# Patient Record
Sex: Male | Born: 1935 | Race: Black or African American | Hispanic: No | Smoking: Former smoker
Health system: Southern US, Community
[De-identification: ages and names within clinical notes are randomized; demographics above are authoritative.]

## PROBLEM LIST (undated history)

## (undated) DIAGNOSIS — I6529 Occlusion and stenosis of unspecified carotid artery: Secondary | ICD-10-CM

## (undated) DIAGNOSIS — I701 Atherosclerosis of renal artery: Secondary | ICD-10-CM

## (undated) DIAGNOSIS — D649 Anemia, unspecified: Secondary | ICD-10-CM

## (undated) DIAGNOSIS — E119 Type 2 diabetes mellitus without complications: Secondary | ICD-10-CM

## (undated) DIAGNOSIS — I739 Peripheral vascular disease, unspecified: Secondary | ICD-10-CM

## (undated) DIAGNOSIS — N289 Disorder of kidney and ureter, unspecified: Secondary | ICD-10-CM

## (undated) DIAGNOSIS — I10 Essential (primary) hypertension: Secondary | ICD-10-CM

## (undated) DIAGNOSIS — E7800 Pure hypercholesterolemia, unspecified: Secondary | ICD-10-CM

## (undated) HISTORY — DX: Essential (primary) hypertension: I10

## (undated) HISTORY — PX: COLONOSCOPY: SHX174

## (undated) HISTORY — DX: Pure hypercholesterolemia, unspecified: E78.00

## (undated) HISTORY — PX: APPENDECTOMY: SHX54

## (undated) HISTORY — PX: OTHER SURGICAL HISTORY: SHX169

## (undated) HISTORY — DX: Atherosclerosis of renal artery: I70.1

## (undated) HISTORY — DX: Occlusion and stenosis of unspecified carotid artery: I65.29

## (undated) HISTORY — DX: Anemia, unspecified: D64.9

## (undated) HISTORY — DX: Type 2 diabetes mellitus without complications: E11.9

## (undated) HISTORY — DX: Peripheral vascular disease, unspecified: I73.9

---

## 1997-10-29 HISTORY — PX: OTHER SURGICAL HISTORY: SHX169

## 2006-03-12 ENCOUNTER — Ambulatory Visit: Payer: Self-pay | Admitting: *Deleted

## 2006-03-22 ENCOUNTER — Ambulatory Visit: Payer: Self-pay | Admitting: *Deleted

## 2006-04-28 ENCOUNTER — Emergency Department: Payer: Self-pay | Admitting: Internal Medicine

## 2006-04-28 ENCOUNTER — Other Ambulatory Visit: Payer: Self-pay

## 2006-05-06 ENCOUNTER — Ambulatory Visit: Payer: Self-pay | Admitting: Internal Medicine

## 2006-06-28 ENCOUNTER — Ambulatory Visit: Payer: Self-pay | Admitting: Cardiology

## 2006-07-19 ENCOUNTER — Ambulatory Visit (HOSPITAL_COMMUNITY): Admission: RE | Admit: 2006-07-19 | Discharge: 2006-07-19 | Payer: Self-pay | Admitting: Cardiology

## 2006-07-19 ENCOUNTER — Ambulatory Visit: Payer: Self-pay | Admitting: Cardiology

## 2006-08-14 ENCOUNTER — Ambulatory Visit: Payer: Self-pay | Admitting: Cardiology

## 2006-09-09 ENCOUNTER — Ambulatory Visit: Payer: Self-pay

## 2007-03-11 ENCOUNTER — Ambulatory Visit: Payer: Self-pay

## 2007-05-05 ENCOUNTER — Ambulatory Visit: Payer: Self-pay | Admitting: Family Medicine

## 2007-09-01 ENCOUNTER — Ambulatory Visit: Payer: Self-pay

## 2007-09-01 ENCOUNTER — Ambulatory Visit: Payer: Self-pay | Admitting: Cardiovascular Disease

## 2008-03-09 ENCOUNTER — Ambulatory Visit: Payer: Self-pay

## 2008-03-09 ENCOUNTER — Ambulatory Visit: Payer: Self-pay | Admitting: Cardiovascular Disease

## 2008-09-09 ENCOUNTER — Ambulatory Visit: Payer: Self-pay | Admitting: Cardiovascular Disease

## 2009-03-15 DIAGNOSIS — I2510 Atherosclerotic heart disease of native coronary artery without angina pectoris: Secondary | ICD-10-CM | POA: Insufficient documentation

## 2009-03-15 DIAGNOSIS — N189 Chronic kidney disease, unspecified: Secondary | ICD-10-CM

## 2009-03-15 DIAGNOSIS — E118 Type 2 diabetes mellitus with unspecified complications: Secondary | ICD-10-CM | POA: Insufficient documentation

## 2009-03-15 DIAGNOSIS — I10 Essential (primary) hypertension: Secondary | ICD-10-CM

## 2009-03-15 DIAGNOSIS — D631 Anemia in chronic kidney disease: Secondary | ICD-10-CM | POA: Insufficient documentation

## 2009-03-15 DIAGNOSIS — I739 Peripheral vascular disease, unspecified: Secondary | ICD-10-CM | POA: Insufficient documentation

## 2009-03-15 DIAGNOSIS — I779 Disorder of arteries and arterioles, unspecified: Secondary | ICD-10-CM | POA: Insufficient documentation

## 2009-03-16 ENCOUNTER — Ambulatory Visit: Payer: Self-pay | Admitting: Cardiovascular Disease

## 2009-03-16 ENCOUNTER — Ambulatory Visit: Payer: Self-pay

## 2009-03-16 DIAGNOSIS — I701 Atherosclerosis of renal artery: Secondary | ICD-10-CM | POA: Insufficient documentation

## 2009-09-21 ENCOUNTER — Ambulatory Visit: Payer: Self-pay

## 2009-09-21 ENCOUNTER — Encounter: Payer: Self-pay | Admitting: Cardiovascular Disease

## 2010-03-13 ENCOUNTER — Encounter: Payer: Self-pay | Admitting: Cardiovascular Disease

## 2010-03-14 ENCOUNTER — Ambulatory Visit: Payer: Self-pay | Admitting: Cardiovascular Disease

## 2010-03-14 ENCOUNTER — Ambulatory Visit: Payer: Self-pay

## 2010-08-17 ENCOUNTER — Ambulatory Visit: Payer: Self-pay | Admitting: Family Medicine

## 2010-09-20 ENCOUNTER — Ambulatory Visit: Payer: Self-pay

## 2010-09-20 ENCOUNTER — Ambulatory Visit: Payer: Self-pay | Admitting: Cardiovascular Disease

## 2010-11-28 NOTE — Miscellaneous (Signed)
Summary: Orders Update  Clinical Lists Changes  Orders: Added new Test order of Carotid Duplex (Carotid Duplex) - Signed 

## 2010-11-28 NOTE — Assessment & Plan Note (Signed)
Summary: Samuel Bryan   Visit Type:  1 year follow up Primary Provider:  Dr Ruthann Cancer  CC:  Left leg pain at  night and knees pain.  History of Present Illness: 77 year-old man with HTN, Diabetes, carotid stenosis, and renal artery stenosis, presents for follow-up evaluation. He has been symptomatically stable since his evaluation last year. He denies chest pain, dyspnea, edema, or lightheadedness. He complains of leg weakness with activity, mostly in his knees. No amaurosis, aphasia, or stroke/TIA symptoms.  Current Medications (verified): 1)  Metformin Hcl 500 Mg Tabs (Metformin Hcl) .... Take 1 Tablet By Mouth Once A Day 2)  Cilostazol 100 Mg Tabs (Cilostazol) .... Take 1 Tablet By Mouth Two Times A Day 3)  Lipitor 40 Mg Tabs (Atorvastatin Calcium) .... Take One Tablet By Mouth Daily. 4)  Ranitidine Hcl 300 Mg Tabs (Ranitidine Hcl) .... Take 1 Tablet By Mouth Two Times A Day 5)  Doxazosin Mesylate 8 Mg Tabs (Doxazosin Mesylate) .... Take 1 Tablet By Mouth Once A Day 6)  Bisoprolol Fumarate 5 Mg Tabs (Bisoprolol Fumarate) .... Take 1 Tablet By Mouth Once A Day 7)  Micardis 40 Mg Tabs (Telmisartan) .... Take 1 Tablet By Mouth Once A Day 8)  Amlodipine Besylate 10 Mg Tabs (Amlodipine Besylate) .... Take One Tablet By Mouth Daily 9)  Aspirin 81 Mg Tbec (Aspirin) .... Take One Tablet By Mouth Daily 10)  Hydrochlorothiazide 25 Mg Tabs (Hydrochlorothiazide) .... Take One Tablet By Mouth Daily. 11)  Fish Oil 1000 Mg Caps (Omega-3 Fatty Acids) .... Take 1 Capsule By Mouth Two Times A Day  Allergies (verified): No Known Drug Allergies  Past History:  Past medical history reviewed for relevance to current acute and chronic problems.  Past Medical History: Reviewed history from 03/15/2009 and no changes required.   PERIPHERAL VASCULAR DISEASE (ICD-443.9) RENAL ARTERY STENOSIS (ICD-440.1). Asymptomatic CAD (ICD-414.00)- coronary artery stenting at [facility] in 1999. HYPERTENSION (ICD-401.9) CAROTID  STENOSIS (ICD-433.10) Asymptomatic DM (ICD-250.00) ANEMIA, HX OF (ICD-V12.3) .  Review of Systems       Negative except as per HPI   Vital Signs:  Patient profile:   77 year old male Height:      66 inches Weight:      189.75 pounds BMI:     30.74 Pulse rate:   68 / minute Pulse rhythm:   regular Resp:     18 per minute BP sitting:   132 / 56  (left arm) Cuff size:   large  Vitals Entered By: Sidney Ace (Mar 14, 2010 9:40 AM)  Serial Vital Signs/Assessments:  Time      Position  BP       Pulse  Resp  Temp     By           R Arm     138/56                         Sidney Ace   Physical Exam  General:  Pt is alert and oriented, elderly, African-American male, in no acute distress. HEENT: normal Neck: normal carotid upstrokes with bilateral bruits, JVP normal Lungs: CTA CV: RRR without murmur or gallop Abd: soft, NT, positive BS, no bruit, no organomegaly Ext: no clubbing, cyanosis, or edema. peripheral pulses 2+ and equal Skin: warm and dry without rash    Carotid Doppler  Procedure date:  03/14/2010  Findings:      Mild carotid disease on the right (0-39%) Moderately  severe disease on the left (60-79%) with peak velocity 404/74  Impression & Recommendations:  Problem # 1:  CAROTID STENOSIS (ICD-433.10) The patient has moderate, asymptomatic left internal carotid stenosis. His disease has progressed by velocity measurements. Recommend follow-up duplex and office visit in 6 months. He was counseled regarding stroke/TIA symptoms. He should continue with aggressive risk reduction measurements.  His updated medication list for this problem includes:    Cilostazol 100 Mg Tabs (Cilostazol) .Marland Kitchen... Take 1 tablet by mouth two times a day    Aspirin 81 Mg Tbec (Aspirin) .Marland Kitchen... Take one tablet by mouth daily  Problem # 2:  RENAL ATHEROSCLEROSIS (ICD-440.1) BP well-controlled on antihypertensive therapy. No indication for revascularization at present.  Problem # 3:   CAD (ICD-414.00) Stable without angina. Continue ASA, beta-blocker, etc.  His updated medication list for this problem includes:    Cilostazol 100 Mg Tabs (Cilostazol) .Marland Kitchen... Take 1 tablet by mouth two times a day    Bisoprolol Fumarate 5 Mg Tabs (Bisoprolol fumarate) .Marland Kitchen... Take 1 tablet by mouth once a day    Amlodipine Besylate 10 Mg Tabs (Amlodipine besylate) .Marland Kitchen... Take one tablet by mouth daily    Aspirin 81 Mg Tbec (Aspirin) .Marland Kitchen... Take one tablet by mouth daily  Problem # 4:  HYPERTENSION (ICD-401.9) Assessment: Unchanged stable  His updated medication list for this problem includes:    Doxazosin Mesylate 8 Mg Tabs (Doxazosin mesylate) .Marland Kitchen... Take 1 tablet by mouth once a day    Bisoprolol Fumarate 5 Mg Tabs (Bisoprolol fumarate) .Marland Kitchen... Take 1 tablet by mouth once a day    Micardis 40 Mg Tabs (Telmisartan) .Marland Kitchen... Take 1 tablet by mouth once a day    Amlodipine Besylate 10 Mg Tabs (Amlodipine besylate) .Marland Kitchen... Take one tablet by mouth daily    Aspirin 81 Mg Tbec (Aspirin) .Marland Kitchen... Take one tablet by mouth daily    Hydrochlorothiazide 25 Mg Tabs (Hydrochlorothiazide) .Marland Kitchen... Take one tablet by mouth daily.  BP today: 132/56 Prior BP: 124/50 (03/16/2009)  Patient Instructions: 1)  Your physician recommends that you continue on your current medications as directed. Please refer to the Current Medication list given to you today. 2)  Your physician wants you to follow-up in: [place] will receive a reminder letter in the mail two months in advance. If you don't receive a letter, please call our office to schedule the follow-up appointment. 3)  Your physician has requested that you have a carotid duplex in 6 MONTHS. This test is an ultrasound of the carotid arteries in your neck. It looks at blood flow through these arteries that supply the brain with blood. Allow one hour for this exam. There are no restrictions or special instructions.

## 2010-11-28 NOTE — Assessment & Plan Note (Signed)
Summary: f42m   Visit Type:  Follow-up Primary Provider:  Dr Ruthann Cancer  CC:  No complaints.  History of Present Illness: 77 year-old man with HTN, Diabetes, carotid stenosis, and renal artery stenosis, presents for follow-up evaluation. He has a history of moderate, asymptomatic carotid stenosis, left greater than right. Previous duplex showed left ICA velocities 404/74 correlating with 60-79% stenosis.  The patient is doing well. His only complaint is knee and hip fatigue with walking. He denies calf or thigh pain. No chest pain, dyspnea, edema, lightheadedness, or syncope. No stroke or TIA symptoms. He specifically denies amaurosis, clumsiness, or slurred speech.    Current Medications (verified): 1)  Metformin Hcl 500 Mg Tabs (Metformin Hcl) .... Take 1 Tablet By Mouth Once A Day 2)  Cilostazol 100 Mg Tabs (Cilostazol) .... Take 1 Tablet By Mouth Two Times A Day 3)  Lipitor 40 Mg Tabs (Atorvastatin Calcium) .... Take One Tablet By Mouth Daily. 4)  Ranitidine Hcl 300 Mg Tabs (Ranitidine Hcl) .... Take 1 Tablet By Mouth Two Times A Day 5)  Doxazosin Mesylate 8 Mg Tabs (Doxazosin Mesylate) .... Take 1 Tablet By Mouth Once A Day 6)  Bisoprolol Fumarate 5 Mg Tabs (Bisoprolol Fumarate) .... Take 1 Tablet By Mouth Once A Day 7)  Micardis 40 Mg Tabs (Telmisartan) .... Take 1 Tablet By Mouth Once A Day 8)  Amlodipine Besylate 10 Mg Tabs (Amlodipine Besylate) .... Take One Tablet By Mouth Daily 9)  Aspirin 81 Mg Tbec (Aspirin) .... Take One Tablet By Mouth Daily 10)  Hydrochlorothiazide 25 Mg Tabs (Hydrochlorothiazide) .... Take One Tablet By Mouth Daily. 11)  Fish Oil 1000 Mg Caps (Omega-3 Fatty Acids) .... Take 1 Capsule By Mouth Two Times A Day  Allergies (verified): No Known Drug Allergies  Past History:  Past medical history reviewed for relevance to current acute and chronic problems.  Past Medical History: Reviewed history from 03/15/2009 and no changes required.   PERIPHERAL  VASCULAR DISEASE (ICD-443.9) RENAL ARTERY STENOSIS (ICD-440.1). Asymptomatic CAD (ICD-414.00)- coronary artery stenting at [facility] in 1999. HYPERTENSION (ICD-401.9) CAROTID STENOSIS (ICD-433.10) Asymptomatic DM (ICD-250.00) ANEMIA, HX OF (ICD-V12.3) .  Review of Systems       Negative except as per HPI   Vital Signs:  Patient profile:   77 year old male Height:      66 inches Weight:      184.50 pounds BMI:     29.89 Pulse rate:   72 / minute Pulse rhythm:   regular Resp:     18 per minute BP sitting:   130 / 58  (left arm) Cuff size:   large  Vitals Entered By: Sidney Ace (September 20, 2010 11:04 AM)  Serial Vital Signs/Assessments:  Time      Position  BP       Pulse  Resp  Temp     By           R Arm     138/56                         Sidney Ace   Physical Exam  General:  Pt is alert and oriented, elderly, African-American male, in no acute distress. HEENT: normal Neck: normal carotid upstrokes with bilateral bruits, JVP normal Lungs: CTA CV: RRR without murmur or gallop Abd: soft, NT, positive BS, no bruit, no organomegaly Ext: no clubbing, cyanosis, or edema. femoral pulses 2+ with bilateral bruits Skin: warm and dry without rash  Carotid Doppler  Procedure date:  09/20/2010  Findings:      RICA less than [id number] stenosis LICA [id number] stenosis, peak velocities 318/46 prox ICA  Impression & Recommendations:  Problem # 1:  CAROTID STENOSIS (ICD-433.10) The patient is stable without neurologic symptoms. He is on appropriate med Rx with ASA and a statin. Recommend f/u in 6 months and if stable disease at that point will go back to yearly carotid surveillance.  His updated medication list for this problem includes:    Cilostazol 100 Mg Tabs (Cilostazol) .Marland Kitchen... Take 1 tablet by mouth two times a day    Aspirin 81 Mg Tbec (Aspirin) .Marland Kitchen... Take one tablet by mouth daily  Problem # 2:  PERIPHERAL VASCULAR DISEASE (ICD-443.9) Stable, atypical leg pain likely  multifactorial. I suspect osteoarthritis is playing a significant role. Continue cilostazol and observation. Encouraged him to stay as active as possible.  Problem # 3:  CAD (ICD-414.00) Stable without angina.  His updated medication list for this problem includes:    Cilostazol 100 Mg Tabs (Cilostazol) .Marland Kitchen... Take 1 tablet by mouth two times a day    Bisoprolol Fumarate 5 Mg Tabs (Bisoprolol fumarate) .Marland Kitchen... Take 1 tablet by mouth once a day    Amlodipine Besylate 10 Mg Tabs (Amlodipine besylate) .Marland Kitchen... Take one tablet by mouth daily    Aspirin 81 Mg Tbec (Aspirin) .Marland Kitchen... Take one tablet by mouth daily  Patient Instructions: 1)  Your physician recommends that you continue on your current medications as directed. Please refer to the Current Medication list given to you today. 2)  Your physician wants you to follow-up in: 6 MONTHS.   You will receive a reminder letter in the mail two months in advance. If you don't receive a letter, please call our office to schedule the follow-up appointment. 3)  Your physician has requested that you have a carotid duplex in 6 MONTHS.  This test is an ultrasound of the carotid arteries in your neck. It looks at blood flow through these arteries that supply the brain with blood. Allow one hour for this exam. There are no restrictions or special instructions.

## 2011-02-28 ENCOUNTER — Ambulatory Visit (INDEPENDENT_AMBULATORY_CARE_PROVIDER_SITE_OTHER): Payer: Medicare Other | Admitting: Cardiovascular Disease

## 2011-02-28 ENCOUNTER — Encounter: Payer: Self-pay | Admitting: Cardiovascular Disease

## 2011-02-28 VITALS — BP 126/50 | HR 80 | Resp 18 | Ht 66.0 in | Wt 187.4 lb

## 2011-02-28 DIAGNOSIS — I6529 Occlusion and stenosis of unspecified carotid artery: Secondary | ICD-10-CM

## 2011-02-28 DIAGNOSIS — I739 Peripheral vascular disease, unspecified: Secondary | ICD-10-CM

## 2011-02-28 DIAGNOSIS — I10 Essential (primary) hypertension: Secondary | ICD-10-CM

## 2011-02-28 NOTE — Assessment & Plan Note (Signed)
The patient has moderate left internal carotid artery stenosis. Recommend followup carotid duplex scan in 6 months which will day to one year from his previous study. He should continue with his current medical therapy which includes aspirin, atorvastatin, and telmisartan.

## 2011-02-28 NOTE — Progress Notes (Signed)
HPI:  This is an 77 year old gentleman presenting for followup evaluation. He has a history of asymptomatic moderate left internal carotid artery stenosis. His last carotid duplex scan was in November 2011 and it demonstrated less than 40% stenosis on the right and 60-80% stenosis on the left with peak velocities of 318/46 in the proximal internal carotid. He reports no neurologic symptoms. He specifically denies numbness, tingling, or weakness of his extremities. He denies episodes of amaurosis or expressive aphasia.  He has occasional chest pains, these are nonexertional. He has not taken nitroglycerin. He denies exertional dyspnea or edema.  Outpatient Encounter Prescriptions as of 02/28/2011  Medication Sig Dispense Refill  . amLODipine (NORVASC) 10 MG tablet Take 10 mg by mouth daily.        Marland Kitchen aspirin 81 MG tablet Take 81 mg by mouth daily.        Marland Kitchen atorvastatin (LIPITOR) 40 MG tablet Take 40 mg by mouth daily.        . bisoprolol (ZEBETA) 5 MG tablet Take 5 mg by mouth daily.        . cilostazol (PLETAL) 100 MG tablet Take 100 mg by mouth 2 (two) times daily.        Marland Kitchen doxazosin (CARDURA) 8 MG tablet Take 8 mg by mouth at bedtime.        . hydrochlorothiazide 25 MG tablet Take 25 mg by mouth daily.        . metFORMIN (GLUCOPHAGE) 500 MG tablet Take 500 mg by mouth daily.        . Omega-3 Fatty Acids (FISH OIL) 1000 MG CAPS Take 2 capsules by mouth daily.        . ranitidine (ZANTAC) 300 MG capsule Take 300 mg by mouth every evening.        Marland Kitchen telmisartan (MICARDIS) 40 MG tablet Take 40 mg by mouth daily.          No Known Allergies  Past Medical History  Diagnosis Date  . Type 2 diabetes mellitus   . Peripheral vascular disease, unspecified     mild lifestyle limiting claudication  . Occlusion and stenosis of carotid artery without mention of cerebral infarction     moderate left ICA stenosis  . Essential hypertension, benign   . Pure hypercholesterolemia   . Atherosclerosis of renal  artery     ROS: Negative except as per HPI  BP 126/50  Pulse 80  Resp 18  Ht 5\' 6"  (1.676 m)  Wt 187 lb 6.4 oz (85.004 kg)  BMI 30.25 kg/m2  PHYSICAL EXAM: Pt is alert and oriented, Elderly male in NAD HEENT: normal Neck: JVP - normal, carotids 2+= with bilateral bruits Lungs: CTA bilaterally CV: RRR without murmur or gallop Abd: soft, NT, Positive BS, no hepatomegaly Ext: no C/C/E, pedal pulses are nonpalpable. Skin: warm/dry no rash  ASSESSMENT AND PLAN:

## 2011-02-28 NOTE — Patient Instructions (Signed)
Your physician wants you to follow-up in: 6 MONTHS.  You will receive a reminder letter in the mail two months in advance. If you don't receive a letter, please call our office to schedule the follow-up appointment.  Your physician has requested that you have a carotid duplex in November 2012. This test is an ultrasound of the carotid arteries in your neck. It looks at blood flow through these arteries that supply the brain with blood. Allow one hour for this exam. There are no restrictions or special instructions.  Your physician recommends that you continue on your current medications as directed. Please refer to the Current Medication list given to you today.

## 2011-02-28 NOTE — Assessment & Plan Note (Signed)
Blood pressure is well controlled on current medical therapy.

## 2011-02-28 NOTE — Assessment & Plan Note (Signed)
He reports minimal symptoms of claudication at present. Continue Pletal and observation. He was encouraged to walk as much as possible. Secondary risk reduction measures as outlined.

## 2011-03-13 NOTE — Progress Notes (Signed)
[facility]                        PERIPHERAL VASCULAR OFFICE NOTE   Samuel Bryan, Samuel Bryan                        MRN:          [id number]  DATE:09/01/2007                            DOB:          [birth year]    Samuel Bryan was seen in followup at the [facility] vascular  clinic on September 01, 2007.  Samuel Bryan is a very nice 77 year old  gentleman with hypertension, diabetes, asymptomatic carotid stenosis and  moderate right renal artery stenosis.  He underwent renal angiography by  Dr. Albertine Patricia in 2007 after his renal Duplex suggested renal artery  stenosis.  The angiogram demonstrated only moderate unilateral renal  artery stenosis and he has been observed and has continued with medical  therapy for his hypertension.  Mr.  Peduzzi has also had mild  claudication symptoms but he has not had significant limitation from  this.  Presently he is doing quite well and has no specific complaints.  He denies chest pain, dyspnea, neurologic symptoms or leg edema.   CURRENT MEDICATIONS:  1. Metformin 500 mg daily  2. Niaspan 500 mg daily  3. Cilostazol 100 mg daily  4. Lipitor 40 mg daily  5. Ranitidine 300 mg twice daily  6. Doxazosin 8 mg daily  7. Aspirin 81 mg daily  8. Hydrochlorothiazide 12.5 mg daily  9. Bisoprolol 5 mg daily  10.Avapro 150 mg daily  11.Amlodipine 10 mg daily  12.Vitamin C 500 mg daily   PHYSICAL EXAMINATION:  On exam, he is an elderly male in no acute  distress.  Weight is 182, blood pressure is 120/48 in the right arm,  120/50 in the left arm.  Heart rate is 72.  Respiratory rate is 16.  HEENT:  Normal.  NECK:  Normal carotid upstrokes with soft bilateral bruits.  Jugular  venous pressure is normal.  LUNGS:  Clear to auscultation bilaterally.  CARDIOVASCULAR:  Heart is regular rate and rhythm without murmurs or  gallops.  ABDOMEN:  Soft, nontender, no organomegaly, no bruits.  EXTREMITIES:  No cyanosis, clubbing or  edema.  Peripheral pulses are  intact and equal.   Carotid ultrasound performed earlier today showed mild plaque in the  right carotid bulb and moderate plaque in the left.  The right carotid  artery has 0-39% stenosis, and the left internal carotid artery has 60-  79% stenosis.  The peak velocities on the left show a systolic velocity  of A999333 cm/sec and a diastolic velocity of 63 cm/sec.  These values are  stable from past studies.   ASSESSMENT:  1. Asymptomatic carotid stenosis.  Continue observation with followup      carotid ultrasound in 6 months.  Continue daily aspirin and statin      therapy.  2. Renal artery stenosis.  Was moderate at the time of angiography.      Continue medical therapy for blood pressure control.  3. Peripheral arterial disease.  The patient is minimally symptomatic.      Continue his current therapy which includes Cilostazol and      aggressive secondary risk reduction.  Of note, his ABIs were  in the      normal range at 1.1 bilaterally back in November 2007.   For followup, I would like to see Samuel Bryan back in 6 months after his  carotid ultrasound is complete.     [name]. Burt Knack, MD  Electronically Signed    MDC/MedQ  DD: 09/01/2007  DT: 09/02/2007  Job #: [id number]

## 2011-03-13 NOTE — Progress Notes (Signed)
[facility]                        PERIPHERAL VASCULAR OFFICE NOTE   ANDREIS, MANLEY                        MRN:          [id number]  DATE:03/09/2008                            DOB:          1927-07-06    Samuel Bryan was seen in followup in the [place] peripheral vascular  office on Mar 09, 2008.  Samuel Bryan is a very nice 77 year old gentleman  with hypertension, diabetes, renal artery stenosis and asymptomatic  carotid stenosis.  He underwent renal angiography in 2007 for suspected  renal artery stenosis.  Based on the duplex ultrasound results, he had  moderate unilateral renal stenosis and has done well with continued  observation.  No angioplasty or stenting procedures have been performed.   From a symptomatic standpoint, Samuel Bryan is doing well.  He complains  of bilateral lower leg pain with walking.  His symptoms have been stable  and he Bryan walk for several blocks without stopping.  He denies chest  pain, dyspnea or edema.  He feels well and has no other specific  complaints.   MEDICATIONS:  Include metformin 500 mg daily, Niaspan 500 mg daily,  cilostazol 100 mg, Lipitor 40 mg daily, ranitidine 300 mg twice daily,  doxazosin 8 mg daily, hydrochlorothiazide 12.5 mg daily, bisoprolol 5 mg  daily, Avapro 150 mg daily, amlodipine 10 mg daily, vitamin C 500 mg  daily, aspirin 325 mg daily.   PHYSICAL EXAMINATION:  GENERAL:  He is alert and oriented, in no acute  distress.  VITAL SIGNS:  Weight is 188, blood pressure is 130/60, heart rate 76,  respiratory rate 16.  HEENT:  Normal.  NECK:  Normal carotid upstrokes.  Bilateral carotid bruits, left greater  than right.  Jugular venous pressure is normal.  LUNGS:  Clear bilaterally.  HEART:  Regular rate and rhythm without murmurs or gallops.  ABDOMEN:  Soft, nontender.  No organomegaly.  No bruits.  EXTREMITIES:  Femoral pulses are 2+ with bruits.  Popliteal pulses are  2+, dorsalis  pedis pulses are 2+, posterior tibials are nonpalpable.  SKIN:  Warm and dry without rash.  There are no ulcerations or areas of  skin breakdown.   ASSESSMENT:  1. Asymptomatic carotid stenosis.  Duplex ultrasound from November      2008 showed less than 40% stenosis on the right and moderate      stenosis on the left in the range of 60-80%.  Follow-up ultrasound      was done today.  Results currently pending.  Continue medical      management unless significant progression.  2. Renal artery stenosis.  Continue medical therapy for blood pressure      control.  Blood pressure remains well controlled on current      regimen.  3. Peripheral arterial disease.  The patient remains mildly      symptomatic.  His exam demonstrates good pedal pulses and I would      suspect his leg pain is non vascular in nature.  His ABIs have been      in the normal range.   For followup I  would like to see Samuel Bryan back in 1 year and I would  be happy to see him sooner if any problems arise.     [name]. Burt Knack, MD  Electronically Signed    MDC/MedQ  DD: 03/09/2008  DT: 03/09/2008  Job #: [id number]   cc:   Ashok Norris

## 2011-03-16 NOTE — Progress Notes (Signed)
[facility]                          PERIPHERAL VASCULAR OFFICE NOTE   TERRIEL, EILTS                        MRN:          [id number]  DATE:06/28/2006                            DOB:          05-28-1927    REASON FOR VISIT:  The patient self refers for second opinion regarding  carotid stenosis and possible renovascular disease.   HISTORY OF PRESENT ILLNESS:  Mr. Cuna is a 77 year old gentleman with  atherosclerotic coronary disease in the setting of hypertension and diabetes  mellitus.  He has had hypertension for 10 years and diabetes mellitus for 3  years.  He has asymptomatic carotid stenosis.  CT angiogram of the neck,  performed at [facility], reportedly demonstrated a 65-70%  stenosis of the origin of the left internal carotid artery.  The right  internal carotid had no significant stenosis on that study.  Duplex  ultrasound of the carotids performed on Mar 12, 2006, also at [facility], reportedly demonstrated a 75-95% proximal ICA stenosis on the  left.  Peak systolic velocity was 0000000.  Again, velocities were normal  on the right.  Vertebral flow was reported at antegrade bilaterally.  A  renal ultrasound, performed also on Mar 12, 2006, demonstrated normal  echogenicity in the kidneys with the right measuring 9.8-cm and the left  measuring 10.4.  There was no evidence of hydronephrosis or cortical  thinning.  No prior results are available to me.  However, the patient says  he was told that his right kidney has decreased in size from prior.  Selective renal angiography was performed at some time in the past  demonstrating a 75% left renal artery stenosis.  I have no information on  his renal function.   Mr. Hanchey denies knowledge of any renal dysfunction.  He denies any  hospitalizations for heart failure.  He denies exertional dyspnea, chest  discomfort with exertion or otherwise, amaurosis fugax, as  well as language  difficulty or alterations in strength or sensation to suggest stroke or TIA.   PAST MEDICAL HISTORY:  1. Coronary artery disease, status post coronary artery stenting at [facility] in      1999.  2. Status post appendectomy in 1955.  3. Hypertension for 10 years.  4. Diabetes mellitus for 3 years.  5. Anemia.   ALLERGIES:  NKDA.  NO DYE ALLERGY.   CURRENT MEDICATIONS:  1. Metformin 500 mg per day.  2. Niaspan 1,000 mg per day.  3. Pletal 100 mg per day.  4. Lipitor 40 mg per day.  5. Zantac 300 mg twice per day.  6. Avapro 300 mg per day.  7. Amlodipine 10 mg per day.  8. Doxazosin 8 mg per day.  9. Bisoprolol HCTZ 5/12.5 one per day.  10.Aspirin 81 mg per day.   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  He is accompanied by 3 children today.  He  is married.  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  He previously smoked  but quit 40 years ago.  Denies alcohol use.   FAMILY HISTORY:  Father died at 56 of old age.  Mother died at 44 of  myocardial infarction.  A brother died in his 4s of coronary disease.  Another died at 50 of cancer.  A sister died in her 20s of heart disease and  another sister at 43 of heart disease.  Five other siblings are alive and  well with ages ranging from 51 to 39.  His children are all healthy.   REVIEW OF SYSTEMS:  Remarkable for some decreased hearing in his left ear  which is chronic.  He has partial dentures.  He has occasional swelling in  his legs and occasional nocturnal leg cramps.  He denies any exertional leg  discomfort to me except for some mild discomfort in his knees.  No symptoms  of claudication.  Review of systems is otherwise negative in detail except  as above.   PHYSICAL EXAMINATION:  GENERAL:  He is a generally well-appearing man in no  distress.  VITAL SIGNS:  Heart rate 78, blood pressure 134/80 on the right and 132/76  on the left.  He is 5 feet 6  inches tall and weighs 192 pounds.  HEENT:  Normal.  SKIN:  Normal.  MUSCULOSKELETAL:  Normal.  NECK:  He has no jugular venous distention, thyromegaly, or lymphadenopathy.  RESPIRATORY:  Effort is normal.  LUNGS:  Clear to auscultation.  HEART:  He has a nondisplaced point of maximal cardiac impulse.  There is a  regular rate and rhythm without murmur, rub, or gallop.  There is no S4.  ABDOMEN:  Soft, nondistended, nontender.  There is no hepatosplenomegaly.  Bowel sounds are normal.  There is no abdominal bruit.  No pulsatile midline  mass.  EXTREMITIES:  Warm without clubbing, cyanosis, edema, ulceration.  Carotid  pulses are 2+ bilaterally with a soft bruit on the right.  Femoral pulses  are 2+ bilaterally without bruit.  Popliteal pulse is 1+ on the left and  absent on the right.  In the foot, the left DP is 2+ and PT 1+.  DP and PT  are not palpable on the right.  NEUROLOGIC:  He is alert and oriented  x3 with cranial nerves II-XII intact.  Strength and sensation normal in all four extremities.  Language is normal.   IMPRESSION/RECOMMENDATIONS:  1. Renal artery stenosis.  I told Mr. Mckell and his family that my      recommendation would depend in part on his renal function, we will      therefore check it.  Unless he is in near end stage renal failure, I      concur with Dr. Thedora Hinders recommendation for revascularization of the      left renal artery given the progressive decrease in size of the left      kidney suggesting chronic ischemia.  I stressed to Mr. Shirkey that      while this may improve his blood pressure control, this was by no means      guaranteed.  We discussed details of the procedures and the risks and      potential benefits.  I stressed to him and his family that the benefits      of renal revascularization remain somewhat controversial.  2. Carotid stenosis.  The patient is clearly asymptomatic.  I do not have     any of the primary data for review.   However, the CT suggests clearly  under an 80% stenosis.  The ultrasound was interpreted as a 75-95%      stenosis but the peak systolic velocities are relatively low.  I      suggested to Mr. Leazer that this might best be followed serially with      a repeat 6 months after the first.                                   Ethelle Lyon, MD   WED/MedQ  DD:  06/28/2006  DT:  06/29/2006  Job #:  [id number]   cc:   Ashok Norris

## 2011-03-16 NOTE — Op Note (Signed)
NAMEFONNIE, WELBURN               ACCOUNT NO.:  [id number]   MEDICAL RECORD NO.:  [id number]          PATIENT TYPE:  AMB   LOCATION:  SDS                          FACILITY:  [facility]   PHYSICIAN:  Ethelle Lyon, MD  DATE OF BIRTH:  [birth year]   DATE OF PROCEDURE:  07/19/2006  DATE OF DISCHARGE:                                 OPERATIVE REPORT   PROCEDURE:  Selective bilateral renal angiography, StarClose closure of the  right common femoral arteriotomy site.   INDICATION:  Mr. Morale is a 77 year old gentleman with atherosclerotic  coronary disease, hypertension, and diabetes mellitus.  He has had a  decrease in size in his right kidney which was recently measured 9.8 cm as  compared with 10.4 on the left and an ultrasound performed at [facility].  He has hypertension which has been difficult to control despite  compliance with four medications.  He presents for angiography and possible  renal revascularization.   PROCEDURE TECHNIQUE:  Informed consent was obtained.  Under 1% lidocaine  local anesthesia, a 5-French sheath was placed in the right common femoral  artery using the modified Seldinger technique.  A pigtail catheter was  advanced to the suprarenal abdominal aorta.  Abdominal aortography was  performed by power injection.  This demonstrated diffuse atherosclerotic  plaque of the infrarenal abdominal aorta and a  stenosis at the ostium of  left renal artery of questionable severity.  We then proceeded to selective  bilateral renal angiography using a 5-French LIMA diagnostic catheter.  This  demonstrated approximately 60-70% stenosis of the proximal portion of the  The ostium of the left renal artery.  Using this 5-French catheter, there  was less than 15 mmHg translesional gradient as assessed by pullback.  The  right renal artery is normal.  The arteriotomy was then closed using a  StarClose device.  Complete hemostasis was obtained.   COMPLICATIONS:   None.   FINDINGS:  1. Abdominal aorta:  Diffuse atherosclerotic plaquing without significant      stenosis or evidence of aneurysm formation.  2. Renal arteries:  Single vessels bilaterally.  The right renal artery is      angiographically normal.  The left renal artery has a 60-70% ostial      stenosis with less than 15 mmHg translesional gradient as assessed      using a pullback of a 5-French catheter.   IMPRESSIONS/RECOMMENDATION:  The right renal artery was in question due to  ultrasound suggesting a progressive decrease in size in that kidney.  That  renal artery is normal.  The left renal artery has a moderate stenosis with  a hemodynamically insignificant translesional gradient.  Will manage this  medically.  I do not think this stenosis is significant enough to account  for his difficult to control hypertension.  Will manage this medically.      Ethelle Lyon, MD  Electronically Signed     WED/MEDQ  D:  07/19/2006  T:  07/22/2006  Job:  [id number]   cc:   Bronson Curb, M.D.

## 2011-03-16 NOTE — Progress Notes (Signed)
[facility]                          PERIPHERAL VASCULAR OFFICE NOTE   AMELIA, OLESON                        MRN:          [id number]  DATE:08/14/2006                            DOB:          1927-07-26    HISTORY OF PRESENT ILLNESS:  Mr. Spawn is a 77 year old gentleman with  hypertension and diabetes mellitus.  He has asymptomatic carotid stenosis  and moderate right renal artery stenosis.  Mr. Huneke has done well after  his renal angiogram one month ago.  He has not checked his blood pressure at  home.  He has not had any angina or exertional dyspnea.  He does continue to  have some bilateral calf discomfort with walking up a hill.  He does not  feel that this is substantially lifestyle limiting.   CURRENT MEDICATIONS:  Metformin 500 mg daily, Niaspan 1000 mg daily,  Cilostazol 100 mg per day,  Lipitor 40 mg per day, Zantac 300 mg twice per  day, Avapro 300 mg per day, Dexacidin 8 mg per day, bisoprolol/HCTZ 5/6.25  one per day, aspirin 81 mg per day.   PHYSICAL EXAMINATION:  He is generally well appearing in no distress with  heart rate 76, blood pressure 140/82 and equal bilaterally.  Weight is 193  pounds.  Thin, frail appearing man who appears chronically ill and much older than  his stated age. He has no jugular venous distention, no thyromegaly.  Lungs  are clear to auscultation.  He has a non-displaced point of maximal cardiac  impulse.  He has a regular rate and rhythm without murmur or rub.  There is  no S3 or S4.  The abdomen is soft, nontender, nondistended.  There is no  hepatosplenomegaly, no abdominal bruit, and no pulsatile midline mass.  Bowel sounds are normal.  The extremities are warm without cyanosis,  clubbing, edema, or ulcerations.  Carotid pulses 2+ bilaterally with a soft  bruit on the right.  Femoral pulses 2+ bilaterally without bruit.  Popliteal  pulse is 1+ on the left and enlarged on the right.  In the foot,  the left DP  is 1+, DP is 2+ on the right, with PT not palpable.  The right popliteal  feels enlarged.   IMPRESSION/RECOMMENDATIONS:  1. Renal artery stenosis:  Moderate with preserved renal function      (creatinine 1.3), continue conservative management.  2. Hypertension:  Blood pressure higher than I would like.  To save money,      will switch from Avapro to Lisinopril 20 mg per day.  Will stop the      bisoprolol/HCTZ to allow separation of the two medications and separate      adjustment.  Will continue the bisoprolol at present dose of 5 mg per      day, but increase the HCTZ component to 12.5 mg per day.  3. Carotid stenosis:  Asymptomatic.  Appears just 70-80% based on      ultrasound.  Plan on      repeat duplex in our office in December.  4. Question popliteal aneurysm:  Check duplex ultrasound.  Ethelle Lyon, MD      WED/MedQ  DD:  08/14/2006  DT:  08/15/2006  Job #:  [id number]   cc:   Ashok Norris, M.D.

## 2011-05-01 ENCOUNTER — Encounter: Payer: Self-pay | Admitting: Cardiovascular Disease

## 2011-08-31 ENCOUNTER — Ambulatory Visit (INDEPENDENT_AMBULATORY_CARE_PROVIDER_SITE_OTHER): Payer: Medicare Other | Admitting: Cardiovascular Disease

## 2011-08-31 ENCOUNTER — Encounter: Payer: Self-pay | Admitting: Cardiovascular Disease

## 2011-08-31 ENCOUNTER — Encounter (INDEPENDENT_AMBULATORY_CARE_PROVIDER_SITE_OTHER): Payer: Medicare Other | Admitting: *Deleted

## 2011-08-31 ENCOUNTER — Encounter: Payer: BC Managed Care – PPO | Admitting: Cardiology

## 2011-08-31 VITALS — BP 142/62 | HR 80 | Ht 66.0 in | Wt 184.0 lb

## 2011-08-31 DIAGNOSIS — I10 Essential (primary) hypertension: Secondary | ICD-10-CM

## 2011-08-31 DIAGNOSIS — I6529 Occlusion and stenosis of unspecified carotid artery: Secondary | ICD-10-CM

## 2011-08-31 NOTE — Assessment & Plan Note (Signed)
The patient has stable, moderate left internal carotid artery stenosis. He is on a good medical program which includes a statin and aspirin. Recommend followup carotid duplex and office visit in one year.

## 2011-08-31 NOTE — Progress Notes (Signed)
HPI:  This is an 77 year old gentleman presenting for followup evaluation. He is followed for moderate carotid stenosis. The patient has a history of renal artery stenosis that has been managed medically. He had catheter angiography in 2007 demonstrating patency of his right renal artery and moderate stenosis on the left. The patient had a carotid duplex scan this morning showing 60-79% left internal carotid artery stenosis and 0-39% right internal carotid artery stenosis. He denies any neurologic symptoms. He specifically denies numbness, tingling, or weakness of his extremities. He denies amaurosis fugax. Overall the patient feels well and has no complaints. He denies dyspnea, edema, or palpitations.  He has episodic chest pains unchanged over several years and unrelated to exertion.  Outpatient Encounter Prescriptions as of 08/31/2011  Medication Sig Dispense Refill  . amLODipine (NORVASC) 10 MG tablet Take 10 mg by mouth daily.        Marland Kitchen aspirin 81 MG tablet Take 81 mg by mouth daily.        Marland Kitchen atorvastatin (LIPITOR) 40 MG tablet Take 40 mg by mouth daily.        . bisoprolol (ZEBETA) 5 MG tablet Take 5 mg by mouth daily.        . cilostazol (PLETAL) 100 MG tablet Take 100 mg by mouth 2 (two) times daily.        Marland Kitchen doxazosin (CARDURA) 8 MG tablet Take 8 mg by mouth at bedtime.        . hydrochlorothiazide 25 MG tablet Take 25 mg by mouth daily.        . metFORMIN (GLUCOPHAGE) 500 MG tablet Take 500 mg by mouth daily.        . Omega-3 Fatty Acids (FISH OIL) 1000 MG CAPS Take 2 capsules by mouth daily.        . ranitidine (ZANTAC) 300 MG capsule Take 300 mg by mouth every evening.        Marland Kitchen telmisartan (MICARDIS) 40 MG tablet Take 40 mg by mouth daily.          No Known Allergies  Past Medical History  Diagnosis Date  . Type 2 diabetes mellitus   . Peripheral vascular disease, unspecified     mild lifestyle limiting claudication  . Occlusion and stenosis of carotid artery without mention of  cerebral infarction     moderate left ICA stenosis  . Essential hypertension, benign   . Pure hypercholesterolemia   . Atherosclerosis of renal artery     ROS: Negative except as per HPI  BP 142/62  Pulse 80  Ht 5\' 6"  (1.676 m)  Wt 184 lb (83.462 kg)  BMI 29.70 kg/m2  PHYSICAL EXAM: Pt is alert and oriented, very pleasant elderly man in NAD HEENT: normal Neck: JVP - normal, carotids 2+= with a left carotid bruit Lungs: CTA bilaterally CV: RRR without murmur or gallop Abd: soft, NT, Positive BS, no hepatomegaly Ext: no C/C/E, distal pulses intact and equal Skin: warm/dry no rash  EKG:  Normal sinus rhythm 63 beats per minute, within normal limits.  ASSESSMENT AND PLAN:

## 2011-08-31 NOTE — Patient Instructions (Signed)
Your physician wants you to follow-up in: 12 months. You will receive a reminder letter in the mail two months in advance. If you don't receive a letter, please call our office to schedule the follow-up appointment.  Your physician has requested that you have a carotid duplex. This test is an ultrasound of the carotid arteries in your neck. It looks at blood flow through these arteries that supply the brain with blood. Allow one hour for this exam. There are no restrictions or special instructions. To be done in 12 months on same day as appointment with Dr. Burt Knack.

## 2011-08-31 NOTE — Assessment & Plan Note (Signed)
Blood pressure is well controlled on current medical therapy.

## 2012-04-26 ENCOUNTER — Emergency Department: Payer: Self-pay | Admitting: *Deleted

## 2012-04-26 LAB — CBC WITH DIFFERENTIAL/PLATELET
Basophil #: 0 10*3/uL (ref 0.0–0.1)
HCT: 37 % — ABNORMAL LOW (ref 40.0–52.0)
HGB: 12.2 g/dL — ABNORMAL LOW (ref 13.0–18.0)
Lymphocyte %: 26.9 %
MCH: 31.7 pg (ref 26.0–34.0)
Monocyte #: 0.5 x10 3/mm (ref 0.2–1.0)
Neutrophil #: 1.9 10*3/uL (ref 1.4–6.5)
Neutrophil %: 56.8 %
RBC: 3.85 10*6/uL — ABNORMAL LOW (ref 4.40–5.90)

## 2012-04-26 LAB — BASIC METABOLIC PANEL
BUN: 16 mg/dL (ref 7–18)
Calcium, Total: 8.9 mg/dL (ref 8.5–10.1)
Chloride: 104 mmol/L (ref 98–107)
EGFR (African American): 56 — ABNORMAL LOW
Glucose: 105 mg/dL — ABNORMAL HIGH (ref 65–99)
Potassium: 4 mmol/L (ref 3.5–5.1)
Sodium: 137 mmol/L (ref 136–145)

## 2012-09-04 ENCOUNTER — Ambulatory Visit: Payer: Medicare Other | Admitting: Cardiovascular Disease

## 2012-09-18 ENCOUNTER — Ambulatory Visit: Payer: Medicare Other | Admitting: Cardiovascular Disease

## 2012-11-06 ENCOUNTER — Encounter: Payer: Self-pay | Admitting: Cardiovascular Disease

## 2012-11-06 ENCOUNTER — Ambulatory Visit (INDEPENDENT_AMBULATORY_CARE_PROVIDER_SITE_OTHER): Payer: 59 | Admitting: Cardiovascular Disease

## 2012-11-06 ENCOUNTER — Encounter (INDEPENDENT_AMBULATORY_CARE_PROVIDER_SITE_OTHER): Payer: 59

## 2012-11-06 VITALS — BP 160/66 | HR 63 | Resp 18 | Ht 66.0 in | Wt 190.0 lb

## 2012-11-06 DIAGNOSIS — I6529 Occlusion and stenosis of unspecified carotid artery: Secondary | ICD-10-CM

## 2012-11-06 NOTE — Progress Notes (Signed)
HPI:  This is an 79 year old gentleman presenting for followup evaluation. He is followed for moderate carotid stenosis. The patient has a history of renal artery stenosis that has been managed medically. He had catheter angiography in 2007 demonstrating patency of his right renal artery and moderate stenosis on the left. Carotid ultrasounds have shown 60-79% left carotid stenosis. There is no significant disease on the right.  He had an episode of chest pain last week when he was sitting in a chair. Felt a cramping sensation in the lower chest and abdomen, resolved spontaneously. No symptoms since that time. No dyspnea, palps, or stroke/TIA symptoms.  Outpatient Encounter Prescriptions as of 11/06/2012  Medication Sig Dispense Refill  . amLODipine (NORVASC) 10 MG tablet Take 10 mg by mouth daily.        Marland Kitchen aspirin 81 MG tablet Take 81 mg by mouth daily.        Marland Kitchen atorvastatin (LIPITOR) 40 MG tablet Take 40 mg by mouth daily.        . bisoprolol (ZEBETA) 5 MG tablet Take 5 mg by mouth daily.        . cilostazol (PLETAL) 100 MG tablet Take 100 mg by mouth 2 (two) times daily.        Marland Kitchen doxazosin (CARDURA) 8 MG tablet Take 8 mg by mouth at bedtime.        . hydrochlorothiazide 25 MG tablet Take 25 mg by mouth daily.        . metFORMIN (GLUCOPHAGE) 500 MG tablet Take 500 mg by mouth daily.        . Omega-3 Fatty Acids (FISH OIL) 1000 MG CAPS Take 2 capsules by mouth daily.        . ranitidine (ZANTAC) 300 MG capsule Take 300 mg by mouth every evening.        Marland Kitchen telmisartan (MICARDIS) 40 MG tablet Take 40 mg by mouth daily.          No Known Allergies  Past Medical History  Diagnosis Date  . Type 2 diabetes mellitus   . Peripheral vascular disease, unspecified     mild lifestyle limiting claudication  . Occlusion and stenosis of carotid artery without mention of cerebral infarction     moderate left ICA stenosis  . Essential hypertension, benign   . Pure hypercholesterolemia   .  Atherosclerosis of renal artery     ROS: Positive for left calf claudication, stable at moderate distance, otherwise negative except as per HPI  BP 160/66  Pulse 63  Resp 18  Ht 5\' 6"  (1.676 m)  Wt 86.183 kg (190 lb)  BMI 30.67 kg/m2  PHYSICAL EXAM: Pt is alert and oriented, elderly male in NAD HEENT: normal Neck: JVP - normal, carotids 2+= with a left carotid bruit Lungs: CTA bilaterally CV: RRR without murmur or gallop Abd: soft, NT, Positive BS, no hepatomegaly Ext: no C/C/E Skin: warm/dry no rash  EKG:  NSR 63 bpm, within normal limits  ASSESSMENT AND PLAN: 1. Carotid stenosis, asymptomatic: will repeat duplex scan today. Continue current medical program - meds were reviewed.  2. HTN, essential. BP initially elevated then 160/66 on repeat. State BP was normal at PCP office just a few weeks ago. Continue current meds and monitor as an outpatient.  3. Chest Pain - highly atypical, single episode. No further eval required. He will call back if he develops exertional symptoms.  Plan follow-up 12 months.  Sherren Mocha 11/06/2012 9:49 AM  ADDENDUM (1/16): Carotid duplex  shows 60-79% stenosis of the LICA, stable from past studies. One year follow-up recommended.  Sherren Mocha 11/13/2012 6:18 AM

## 2012-11-13 ENCOUNTER — Encounter: Payer: Self-pay | Admitting: Cardiovascular Disease

## 2013-11-12 ENCOUNTER — Ambulatory Visit: Payer: 59 | Admitting: Cardiovascular Disease

## 2013-12-07 ENCOUNTER — Encounter: Payer: Self-pay | Admitting: Cardiovascular Disease

## 2013-12-07 ENCOUNTER — Ambulatory Visit (INDEPENDENT_AMBULATORY_CARE_PROVIDER_SITE_OTHER): Payer: 59 | Admitting: Cardiovascular Disease

## 2013-12-07 VITALS — BP 140/62 | HR 74 | Ht 66.0 in | Wt 181.0 lb

## 2013-12-07 DIAGNOSIS — I6529 Occlusion and stenosis of unspecified carotid artery: Secondary | ICD-10-CM

## 2013-12-07 NOTE — Progress Notes (Signed)
HPI:  80 year old gentleman presenting for followup evaluation. The patient is followed for carotid stenosis. He also has renal artery stenosis and has been managed medically. Serial carotid ultrasounds have shown 60-79% left internal carotid artery stenosis without significant disease on the right. He has no history of stroke or TIA. The patient has had a difficult time of late. His wife of 43 years passed away about 2 months ago. He is living with his daughter. He denies chest pain or shortness of breath. He's had no focal neurologic symptoms. He has been compliant with his medications. He recently saw Dr Clayborn Bigness for cardiac followup and everything was stable at that time. Outpatient Encounter Prescriptions as of 12/07/2013  Medication Sig  . AMOXICILLIN PO Take by mouth. TAKE ONE TABLET OF AMOXIL TR-K CLV 875/125MG  TWICE A DAY  . aspirin 81 MG tablet Take 81 mg by mouth daily.    Marland Kitchen atorvastatin (LIPITOR) 40 MG tablet Take 40 mg by mouth daily.    . bisoprolol (ZEBETA) 5 MG tablet Take 5 mg by mouth daily.    . cilostazol (PLETAL) 100 MG tablet Take 100 mg by mouth 2 (two) times daily.    Marland Kitchen doxazosin (CARDURA) 8 MG tablet Take 8 mg by mouth at bedtime.    . hydrochlorothiazide 25 MG tablet Take 25 mg by mouth daily.    . metFORMIN (GLUCOPHAGE) 500 MG tablet Take 500 mg by mouth. EVERY OTHER DAY  . montelukast (SINGULAIR) 10 MG tablet Take 10 mg by mouth at bedtime.  . Omega-3 Fatty Acids (FISH OIL) 1000 MG CAPS Take 2 capsules by mouth daily.    Marland Kitchen Phenyleph-Chlorphen-Hydrocod (HYDROCODONE-PE-CHLORPHENIRAMIN PO) Take by mouth. USE AS DIRECTED FOR COUGH  . telmisartan (MICARDIS) 40 MG tablet Take 40 mg by mouth daily.    . [DISCONTINUED] amLODipine (NORVASC) 10 MG tablet Take 10 mg by mouth daily.    . [DISCONTINUED] ranitidine (ZANTAC) 300 MG capsule Take 300 mg by mouth every evening.      No Known Allergies  Past Medical History  Diagnosis Date  . Type 2 diabetes mellitus   .  Peripheral vascular disease, unspecified     mild lifestyle limiting claudication  . Occlusion and stenosis of carotid artery without mention of cerebral infarction     moderate left ICA stenosis  . Essential hypertension, benign   . Pure hypercholesterolemia   . Atherosclerosis of renal artery     ROS: Negative except as per HPI  BP 140/62  Pulse 74  Ht 5\' 6"  (1.676 m)  Wt 181 lb (82.101 kg)  BMI 29.23 kg/m2  PHYSICAL EXAM: Pt is alert and oriented, pleasant elderly male in NAD HEENT: normal Neck: JVP - normal, carotids 2+= with bilateral bruits Lungs: CTA bilaterally CV: RRR without murmur or gallop Abd: soft, NT, Positive BS, no hepatomegaly Ext: no C/C/E, distal pulses intact and equal Skin: warm/dry no rash  EKG:  Normal sinus rhythm 74 beats per minute, within normal limits.  ASSESSMENT AND PLAN: 1. Asymptomatic carotid stenosis. He is due for a followup carotid duplex scan. Will arrange in our [place] office in status closer to his home. I will followup with him in one year. He is on appropriate risk reduction measures with aspirin, a statin drug, and antihypertensive medications with well-controlled blood pressure.  2. Hypertension. Blood pressure control hydrochlorothiazide, telmisartan, doxazosin, and bisoprolol.  3. Hyperlipidemia. Patient is on atorvastatin 40 mg and is followed by his primary physician.  For followup I will  see him back in one year.  Sherren Mocha 12/07/2013 6:35 PM

## 2013-12-07 NOTE — Patient Instructions (Signed)
Your physician has requested that you have a carotid duplex. This test is an ultrasound of the carotid arteries in your neck. It looks at blood flow through these arteries that supply the brain with blood. Allow one hour for this exam. There are no restrictions or special instructions. We will call you to schedule this test in [place].  Your physician recommends that you continue on your current medications as directed. Please refer to the Current Medication list given to you today.  Your physician wants you to follow-up in: 1 year with Dr. Burt Knack.  You will receive a reminder letter in the mail two months in advance. If you don't receive a letter, please call our office to schedule the follow-up appointment.

## 2013-12-08 ENCOUNTER — Other Ambulatory Visit: Payer: Self-pay | Admitting: Nurse Practitioner

## 2013-12-08 DIAGNOSIS — I6529 Occlusion and stenosis of unspecified carotid artery: Secondary | ICD-10-CM

## 2013-12-17 ENCOUNTER — Encounter (INDEPENDENT_AMBULATORY_CARE_PROVIDER_SITE_OTHER): Payer: 59

## 2013-12-17 DIAGNOSIS — I6529 Occlusion and stenosis of unspecified carotid artery: Secondary | ICD-10-CM

## 2013-12-25 ENCOUNTER — Telehealth: Payer: Self-pay | Admitting: Cardiovascular Disease

## 2013-12-25 DIAGNOSIS — I6529 Occlusion and stenosis of unspecified carotid artery: Secondary | ICD-10-CM

## 2013-12-25 NOTE — Telephone Encounter (Signed)
New message     Dad is hard of hearing---daughter calling to get ultrasound results

## 2013-12-25 NOTE — Telephone Encounter (Signed)
Notified daughter of doppler results. Will repeat in 1 year. Order placed and request sent to [facility]

## 2014-12-07 DIAGNOSIS — H40029 Open angle with borderline findings, high risk, unspecified eye: Secondary | ICD-10-CM | POA: Diagnosis not present

## 2015-04-18 ENCOUNTER — Other Ambulatory Visit: Payer: Self-pay

## 2015-04-18 DIAGNOSIS — F329 Major depressive disorder, single episode, unspecified: Secondary | ICD-10-CM

## 2015-04-18 DIAGNOSIS — F32A Depression, unspecified: Secondary | ICD-10-CM

## 2015-04-18 NOTE — Telephone Encounter (Signed)
Received a fax requesting a refill from Citalopram HBR 10 mg to be sent in to [facility].

## 2015-05-09 ENCOUNTER — Ambulatory Visit (INDEPENDENT_AMBULATORY_CARE_PROVIDER_SITE_OTHER): Payer: Medicare PPO | Admitting: Family Medicine

## 2015-05-09 ENCOUNTER — Encounter: Payer: Self-pay | Admitting: Family Medicine

## 2015-05-09 VITALS — BP 122/62 | HR 54 | Temp 97.5°F | Resp 16 | Ht 66.0 in | Wt 173.2 lb

## 2015-05-09 DIAGNOSIS — I2510 Atherosclerotic heart disease of native coronary artery without angina pectoris: Secondary | ICD-10-CM

## 2015-05-09 DIAGNOSIS — I701 Atherosclerosis of renal artery: Secondary | ICD-10-CM

## 2015-05-09 DIAGNOSIS — I10 Essential (primary) hypertension: Secondary | ICD-10-CM

## 2015-05-09 DIAGNOSIS — E1169 Type 2 diabetes mellitus with other specified complication: Secondary | ICD-10-CM | POA: Diagnosis not present

## 2015-05-09 DIAGNOSIS — E785 Hyperlipidemia, unspecified: Secondary | ICD-10-CM

## 2015-05-09 DIAGNOSIS — I779 Disorder of arteries and arterioles, unspecified: Secondary | ICD-10-CM | POA: Diagnosis not present

## 2015-05-09 DIAGNOSIS — I739 Peripheral vascular disease, unspecified: Secondary | ICD-10-CM | POA: Diagnosis not present

## 2015-05-09 DIAGNOSIS — Z862 Personal history of diseases of the blood and blood-forming organs and certain disorders involving the immune mechanism: Secondary | ICD-10-CM | POA: Diagnosis not present

## 2015-05-09 LAB — GLUCOSE, POCT (MANUAL RESULT ENTRY): POC Glucose: 75 mg/dl (ref 70–99)

## 2015-05-09 LAB — POCT GLYCOSYLATED HEMOGLOBIN (HGB A1C): HEMOGLOBIN A1C: 6.2

## 2015-05-09 NOTE — Progress Notes (Signed)
Name: Samuel Bryan.   MRN: [id number]    DOB: [birth year]   Date:05/09/2015       Progress Note  Subjective  Chief Complaint  Chief Complaint  Patient presents with  . Diabetes  . Hyperlipidemia  . Chronic Kidney Disease  . Poor Circulation    Diabetes He presents for his follow-up diabetic visit. He has type 2 diabetes mellitus. His disease course has been improving. Pertinent negatives for hypoglycemia include no dizziness, headaches, nervousness/anxiousness, seizures or tremors. Associated symptoms include weakness. Pertinent negatives for diabetes include no blurred vision, no chest pain and no weight loss. Symptoms are stable. Diabetic complications include heart disease, nephropathy, peripheral neuropathy and PVD. Risk factors for coronary artery disease include diabetes mellitus, dyslipidemia, hypertension, male sex and sedentary lifestyle. Current diabetic treatment includes oral agent (monotherapy). He is compliant with treatment all of the time. His weight is decreasing steadily. He is following a diabetic diet. He rarely participates in exercise. There is no change in his home blood glucose trend. His overall blood glucose range is 90-110 mg/dl.  Hyperlipidemia This is a chronic problem. The current episode started more than 1 year ago. The problem is controlled. Recent lipid tests were reviewed and are normal. Exacerbating diseases include diabetes. Factors aggravating his hyperlipidemia include fatty foods. Pertinent negatives include no chest pain, focal weakness, myalgias or shortness of breath. Current antihyperlipidemic treatment includes statins. The current treatment provides moderate improvement of lipids. There are no compliance problems.      Peripheral vascular disease    patient continues with claudication. There is also some rest pain. He has not seen his vascular surgeon very recently. He remains on cilostazol as well as aspirin and statin.   Carotid stenosis    Patient has bilateral carotid stenosis bruits. He has seen vascular surgeons in the past. Is currently on cilostazol and aspirin as well as statin.  No surgeries  anticipated.   Past Medical History  Diagnosis Date  . Type 2 diabetes mellitus   . Peripheral vascular disease, unspecified     mild lifestyle limiting claudication  . Occlusion and stenosis of carotid artery without mention of cerebral infarction     moderate left ICA stenosis  . Essential hypertension, benign   . Pure hypercholesterolemia   . Atherosclerosis of renal artery     History  Substance Use Topics  . Smoking status: Former Smoker    Types: Cigarettes    Quit date: 10/29/1970  . Smokeless tobacco: Not on file  . Alcohol Use: No     Current outpatient prescriptions:  .  aspirin 81 MG tablet, Take 81 mg by mouth daily.  , Disp: , Rfl:  .  aspirin EC 81 MG tablet, Take by mouth., Disp: , Rfl:  .  atorvastatin (LIPITOR) 40 MG tablet, Take 40 mg by mouth daily.  , Disp: , Rfl:  .  bisoprolol (ZEBETA) 5 MG tablet, Take 5 mg by mouth daily.  , Disp: , Rfl:  .  cilostazol (PLETAL) 100 MG tablet, Take 100 mg by mouth 2 (two) times daily.  , Disp: , Rfl:  .  COMBIGAN 0.2-0.5 % ophthalmic solution, , Disp: , Rfl:  .  doxazosin (CARDURA) 8 MG tablet, Take 8 mg by mouth at bedtime.  , Disp: , Rfl:  .  fenofibrate (TRICOR) 145 MG tablet, , Disp: , Rfl:  .  hydrochlorothiazide 25 MG tablet, Take 25 mg by mouth daily.  , Disp: , Rfl:  .  latanoprost (  XALATAN) 0.005 % ophthalmic solution, , Disp: , Rfl:  .  metFORMIN (GLUCOPHAGE) 500 MG tablet, Take 500 mg by mouth. EVERY OTHER DAY, Disp: , Rfl:  .  Omega-3 Fatty Acids (FISH OIL) 1000 MG CAPS, Take 2 capsules by mouth daily.  , Disp: , Rfl:  .  Phenyleph-Chlorphen-Hydrocod (HYDROCODONE-PE-CHLORPHENIRAMIN PO), Take by mouth. USE AS DIRECTED FOR COUGH, Disp: , Rfl:  .  telmisartan (MICARDIS) 40 MG tablet, Take 40 mg by mouth daily.  , Disp: , Rfl:   No Known  Allergies  Review of Systems  Constitutional: Negative for fever, chills and weight loss.  HENT: Negative for congestion, hearing loss, sore throat and tinnitus.   Eyes: Negative for blurred vision, double vision and redness.  Respiratory: Negative for cough, hemoptysis and shortness of breath.   Cardiovascular: Positive for claudication and leg swelling. Negative for chest pain, palpitations and orthopnea.  Gastrointestinal: Negative for heartburn, nausea, vomiting, diarrhea, constipation and blood in stool.  Genitourinary: Negative for dysuria, urgency, frequency and hematuria.  Musculoskeletal: Positive for joint pain. Negative for myalgias, back pain, falls and neck pain.  Skin: Negative for itching.  Neurological: Positive for weakness. Negative for dizziness, tingling, tremors, focal weakness, seizures, loss of consciousness and headaches.  Endo/Heme/Allergies: Does not bruise/bleed easily.  Psychiatric/Behavioral: Positive for depression. Negative for substance abuse. The patient is not nervous/anxious and does not have insomnia.      Objective  Filed Vitals:   05/09/15 1027  BP: 122/62  Pulse: 54  Temp: 97.5 F (36.4 C)  Resp: 16  Height: 5\' 6"  (1.676 m)  Weight: 173 lb 3 oz (78.557 kg)  SpO2: 99%     Physical Exam  Constitutional: He is oriented to person, place, and time and well-developed, well-nourished, and in no distress.  HENT:  Head: Normocephalic.  Eyes: EOM are normal. Pupils are equal, round, and reactive to light.  Neck: Normal range of motion. Neck supple. No thyromegaly present.  Cardiovascular: Normal rate, regular rhythm and normal heart sounds.   No murmur heard. Pulmonary/Chest: Effort normal and breath sounds normal. No respiratory distress. He has no wheezes.  Abdominal: Soft. Bowel sounds are normal.  Musculoskeletal: Normal range of motion. He exhibits no edema.  Lymphadenopathy:    He has no cervical adenopathy.  Neurological: He is alert  and oriented to person, place, and time. No cranial nerve deficit. Gait normal. Coordination normal.  Skin: Skin is warm and dry. No rash noted.  Psychiatric: Affect and judgment normal.      Assessment & Plan  1. Type 2 diabetes mellitus with other specified complication DRUGSDC metformin - POCT Glucose (CBG) - POCT HgB A1C  - Lipid panel - TSH  2. Essential hypertension  - Comprehensive metabolic panel 3. Atherosclerosis of native coronary artery of native heart without angina pectoris stable  4. RENAL ATHEROSCLEROSIS stable  5. Peripheral vascular disease stable  6. ANEMIA, HX OF Chronic dz  7. Bilateral carotid artery disease stable  8. Hyperlipidemia stable - Comprehensive metabolic panel - Lipid panel - TSH

## 2015-05-09 NOTE — Patient Instructions (Signed)
F/u in 4 mo

## 2015-05-10 LAB — LIPID PANEL
CHOLESTEROL TOTAL: 123 mg/dL (ref 100–199)
Chol/HDL Ratio: 3.2 ratio units (ref 0.0–5.0)
HDL: 38 mg/dL — ABNORMAL LOW (ref >39)
LDL CALC: 76 mg/dL (ref 0–99)
Triglycerides: 44 mg/dL (ref 0–149)
VLDL CHOLESTEROL CAL: 9 mg/dL (ref 5–40)

## 2015-05-10 LAB — COMPREHENSIVE METABOLIC PANEL
A/G RATIO: 1.4 (ref 1.1–2.5)
ALBUMIN: 4.1 g/dL (ref 3.5–4.7)
ALT: 19 IU/L (ref 0–44)
AST: 29 IU/L (ref 0–40)
Alkaline Phosphatase: 44 IU/L (ref 39–117)
BUN / CREAT RATIO: 16 (ref 10–22)
BUN: 38 mg/dL — ABNORMAL HIGH (ref 8–27)
Bilirubin Total: 0.4 mg/dL (ref 0.0–1.2)
CO2: 23 mmol/L (ref 18–29)
Calcium: 9.6 mg/dL (ref 8.6–10.2)
Chloride: 101 mmol/L (ref 97–108)
Creatinine, Ser: 2.34 mg/dL — ABNORMAL HIGH (ref 0.76–1.27)
GFR, EST AFRICAN AMERICAN: 28 mL/min/{1.73_m2} — AB (ref >59)
GFR, EST NON AFRICAN AMERICAN: 24 mL/min/{1.73_m2} — AB (ref >59)
GLOBULIN, TOTAL: 3 g/dL (ref 1.5–4.5)
GLUCOSE: 109 mg/dL — AB (ref 65–99)
POTASSIUM: 4.8 mmol/L (ref 3.5–5.2)
SODIUM: 141 mmol/L (ref 134–144)
Total Protein: 7.1 g/dL (ref 6.0–8.5)

## 2015-05-10 LAB — TSH: TSH: 1.85 u[IU]/mL (ref 0.450–4.500)

## 2015-05-16 ENCOUNTER — Telehealth: Payer: Self-pay | Admitting: Emergency Medicine

## 2015-05-16 DIAGNOSIS — N189 Chronic kidney disease, unspecified: Secondary | ICD-10-CM

## 2015-05-16 NOTE — Telephone Encounter (Signed)
Spoke to patients daughter and notified her of results. Sent order to referral clerk for appointment

## 2015-05-25 ENCOUNTER — Other Ambulatory Visit: Payer: Self-pay

## 2015-05-25 MED ORDER — CITALOPRAM HYDROBROMIDE 10 MG PO TABS: 10.0000 mg | ORAL_TABLET | Freq: Every day | ORAL | Status: DC

## 2015-05-31 ENCOUNTER — Other Ambulatory Visit: Payer: Self-pay

## 2015-05-31 MED ORDER — FENOFIBRATE 145 MG PO TABS: 145.0000 mg | ORAL_TABLET | Freq: Every day | ORAL | Status: DC

## 2015-06-07 DIAGNOSIS — H40019 Open angle with borderline findings, low risk, unspecified eye: Secondary | ICD-10-CM | POA: Diagnosis not present

## 2015-06-21 ENCOUNTER — Other Ambulatory Visit: Payer: Self-pay | Admitting: Family Medicine

## 2015-06-22 ENCOUNTER — Other Ambulatory Visit: Payer: Self-pay

## 2015-06-22 ENCOUNTER — Other Ambulatory Visit: Payer: Self-pay | Admitting: Emergency Medicine

## 2015-06-22 MED ORDER — CITALOPRAM HYDROBROMIDE 10 MG PO TABS: 10.0000 mg | ORAL_TABLET | Freq: Every day | ORAL | Status: DC

## 2015-06-22 MED ORDER — CILOSTAZOL 100 MG PO TABS: 100.0000 mg | ORAL_TABLET | Freq: Two times a day (BID) | ORAL | Status: DC

## 2015-06-22 NOTE — Telephone Encounter (Signed)
I am forwarding this encounter to the designated PCP and/or their nursing staff for further management of the tasks requested. Thank you.  

## 2015-06-23 NOTE — Telephone Encounter (Signed)
Script sent to pharmacy.

## 2015-08-11 DIAGNOSIS — N401 Enlarged prostate with lower urinary tract symptoms: Secondary | ICD-10-CM | POA: Diagnosis not present

## 2015-08-11 DIAGNOSIS — F329 Major depressive disorder, single episode, unspecified: Secondary | ICD-10-CM | POA: Diagnosis not present

## 2015-08-11 DIAGNOSIS — I2510 Atherosclerotic heart disease of native coronary artery without angina pectoris: Secondary | ICD-10-CM | POA: Diagnosis not present

## 2015-08-11 DIAGNOSIS — R0602 Shortness of breath: Secondary | ICD-10-CM | POA: Diagnosis not present

## 2015-08-11 DIAGNOSIS — I209 Angina pectoris, unspecified: Secondary | ICD-10-CM | POA: Diagnosis not present

## 2015-08-18 ENCOUNTER — Other Ambulatory Visit: Payer: Self-pay | Admitting: Family Medicine

## 2015-08-19 ENCOUNTER — Telehealth: Payer: Self-pay

## 2015-08-19 NOTE — Telephone Encounter (Signed)
Spoke with Leafy Ro at [facility] and we have scheduled Samuel Bryan on 08-23-15 at 10:40am ,will fax [facility] referral notes to [phone] and notified the daughter of appt, date and time.

## 2015-08-23 ENCOUNTER — Telehealth: Payer: Self-pay | Admitting: Family Medicine

## 2015-08-23 DIAGNOSIS — N184 Chronic kidney disease, stage 4 (severe): Secondary | ICD-10-CM | POA: Diagnosis not present

## 2015-08-23 DIAGNOSIS — E1122 Type 2 diabetes mellitus with diabetic chronic kidney disease: Secondary | ICD-10-CM | POA: Diagnosis not present

## 2015-08-23 DIAGNOSIS — E785 Hyperlipidemia, unspecified: Secondary | ICD-10-CM | POA: Diagnosis not present

## 2015-08-23 DIAGNOSIS — I10 Essential (primary) hypertension: Secondary | ICD-10-CM | POA: Diagnosis not present

## 2015-08-23 NOTE — Telephone Encounter (Signed)
Daughter is asking for call back on behalf of her dad and his BP meds.

## 2015-08-23 NOTE — Telephone Encounter (Signed)
plz call

## 2015-08-25 NOTE — Telephone Encounter (Signed)
I did call the daughter and she was concerned about her father being switched to the generic brand of his medications. I told her that it should not cause any harm to her father as it was the same medication just formulated differently. Told her to call back if she had any other questions.

## 2015-08-31 DIAGNOSIS — N183 Chronic kidney disease, stage 3 (moderate): Secondary | ICD-10-CM | POA: Diagnosis not present

## 2015-09-13 ENCOUNTER — Encounter: Payer: Self-pay | Admitting: Family Medicine

## 2015-09-13 ENCOUNTER — Ambulatory Visit (INDEPENDENT_AMBULATORY_CARE_PROVIDER_SITE_OTHER): Payer: Medicare PPO | Admitting: Family Medicine

## 2015-09-13 ENCOUNTER — Other Ambulatory Visit: Payer: Self-pay | Admitting: Family Medicine

## 2015-09-13 VITALS — BP 118/62 | HR 65 | Temp 98.0°F | Resp 16 | Ht 66.0 in | Wt 177.0 lb

## 2015-09-13 DIAGNOSIS — J069 Acute upper respiratory infection, unspecified: Secondary | ICD-10-CM

## 2015-09-13 DIAGNOSIS — E118 Type 2 diabetes mellitus with unspecified complications: Secondary | ICD-10-CM | POA: Diagnosis not present

## 2015-09-13 DIAGNOSIS — E1169 Type 2 diabetes mellitus with other specified complication: Secondary | ICD-10-CM | POA: Diagnosis not present

## 2015-09-13 DIAGNOSIS — N183 Chronic kidney disease, stage 3 unspecified: Secondary | ICD-10-CM | POA: Insufficient documentation

## 2015-09-13 DIAGNOSIS — Z23 Encounter for immunization: Secondary | ICD-10-CM

## 2015-09-13 DIAGNOSIS — N182 Chronic kidney disease, stage 2 (mild): Secondary | ICD-10-CM

## 2015-09-13 DIAGNOSIS — E1122 Type 2 diabetes mellitus with diabetic chronic kidney disease: Secondary | ICD-10-CM | POA: Diagnosis not present

## 2015-09-13 DIAGNOSIS — E119 Type 2 diabetes mellitus without complications: Secondary | ICD-10-CM | POA: Insufficient documentation

## 2015-09-13 DIAGNOSIS — I10 Essential (primary) hypertension: Secondary | ICD-10-CM

## 2015-09-13 DIAGNOSIS — E785 Hyperlipidemia, unspecified: Secondary | ICD-10-CM

## 2015-09-13 DIAGNOSIS — I739 Peripheral vascular disease, unspecified: Secondary | ICD-10-CM | POA: Diagnosis not present

## 2015-09-13 DIAGNOSIS — N184 Chronic kidney disease, stage 4 (severe): Secondary | ICD-10-CM | POA: Diagnosis not present

## 2015-09-13 LAB — POCT GLYCOSYLATED HEMOGLOBIN (HGB A1C): Hemoglobin A1C: 5.8

## 2015-09-13 LAB — GLUCOSE, POCT (MANUAL RESULT ENTRY): POC Glucose: 91 mg/dl (ref 70–99)

## 2015-09-13 MED ORDER — AZITHROMYCIN 250 MG PO TABS: ORAL_TABLET | ORAL | Status: DC

## 2015-09-13 MED ORDER — FLUTICASONE PROPIONATE 50 MCG/ACT NA SUSP: 2.0000 | Freq: Every day | NASAL | Status: DC

## 2015-09-13 NOTE — Progress Notes (Signed)
Name: Samuel Bryan.   MRN: [id number]    DOB: [birth year]   Date:09/13/2015       Progress Note  Subjective  Chief Complaint  Chief Complaint  Patient presents with  . Diabetes    HPI  Diabetes  Patient presents for follow-up of diabetes which is present for over 5 years. Is currently on a regimen of diet and exercise . Patient states he is compliant with their diet and exercise. There's been no hypoglycemic episodes and there no polyuria polydipsia polyphagia. His average fasting glucoses been in the low around 90s to low 100s with a high around low 100s . There is no end organ disease.  Last diabetic eye exam was earlier this year.   Last visit with dietitian was more than 1 year ago. Last microalbumin was obtained being done by nephrologist .    Hyperlipidemia  Patient has a history of hyperlipidemia for over 5 years.  Current medical regimen consist of atorvastatin 40 mg daily at bedtime .  Compliance is good .  Diet and exercise are currently followed usually .  Risk factors for cardiovascular disease include hyperlipidemia diabetes hypertension and atherosclerosis .   There have been no side effects from the medication.    Hypertension   Patient presents for follow-up of hypertension. It has been present for over 5 years.  Patient states that there is compliance with medical regimen which consists of hydrochlorothiazide 25 mg daily by soap resolve 5 mg daily as doxazosin 8 mg daily Prilosec 20 mg daily losartan 50 mg daily . There is no end organ disease. Cardiac risk factors include hypertension hyperlipidemia and diabetes.  Exercise regimen consist of walking and gardening .  Diet consist of low-sodium.  Peripheral vascular disease   Long-standing history of peripheral vascular disease for over a decade. He is followed by a vascular surgeon in [place]. He has had them pop bypass grafting in the past. He is currently on cilostazol on a regular basis. He still couldn't tingling  used to experience heaviness in his legs but no other significant pain with ambulation. Exercise consist mainly of walking and some gardening.  Acute URI  Patient complains of nasal congestion and drainage as well as cough for 2 weeks. The cough usually is productive of clear phlegm but it times is been slightly greenish yellow. This been no fever or chills.   Past Medical History  Diagnosis Date  . Type 2 diabetes mellitus ([place])   . Peripheral vascular disease, unspecified ([place])     mild lifestyle limiting claudication  . Occlusion and stenosis of carotid artery without mention of cerebral infarction     moderate left ICA stenosis  . Essential hypertension, benign   . Pure hypercholesterolemia   . Atherosclerosis of renal artery [facility])     Social History  Substance Use Topics  . Smoking status: Former Smoker    Types: Cigarettes    Quit date: 10/29/1970  . Smokeless tobacco: Not on file  . Alcohol Use: No     Current outpatient prescriptions:  .  furosemide (LASIX) 20 MG tablet, Take by mouth., Disp: , Rfl:  .  aspirin 81 MG tablet, Take 81 mg by mouth daily.  , Disp: , Rfl:  .  aspirin EC 81 MG tablet, Take by mouth., Disp: , Rfl:  .  atorvastatin (LIPITOR) 40 MG tablet, Take 40 mg by mouth daily.  , Disp: , Rfl:  .  bisoprolol (ZEBETA) 5 MG tablet, Take 5 mg  by mouth daily.  , Disp: , Rfl:  .  cilostazol (PLETAL) 100 MG tablet, 1 Tablet, Oral, two times daily, Disp: 60 tablet, Rfl: 0 .  cilostazol (PLETAL) 100 MG tablet, Take 1 tablet (100 mg total) by mouth 2 (two) times daily., Disp: 60 tablet, Rfl: 5 .  citalopram (CELEXA) 10 MG tablet, Take 1 tablet (10 mg total) by mouth daily., Disp: 30 tablet, Rfl: 0 .  COMBIGAN 0.2-0.5 % ophthalmic solution, , Disp: , Rfl:  .  doxazosin (CARDURA) 8 MG tablet, Take 8 mg by mouth at bedtime.  , Disp: , Rfl:  .  fenofibrate (TRICOR) 145 MG tablet, Take 1 tablet (145 mg total) by mouth daily., Disp: 30 tablet, Rfl: 3 .   hydrochlorothiazide 25 MG tablet, Take 25 mg by mouth daily.  , Disp: , Rfl:  .  latanoprost (XALATAN) 0.005 % ophthalmic solution, , Disp: , Rfl:  .  losartan (COZAAR) 50 MG tablet, , Disp: , Rfl:  .  Omega-3 Fatty Acids (FISH OIL) 1000 MG CAPS, Take 2 capsules by mouth daily.  , Disp: , Rfl:  .  Phenyleph-Chlorphen-Hydrocod (HYDROCODONE-PE-CHLORPHENIRAMIN PO), Take by mouth. USE AS DIRECTED FOR COUGH, Disp: , Rfl:   No Known Allergies  Review of Systems  Constitutional: Positive for malaise/fatigue. Negative for fever, chills and weight loss.  HENT: Positive for congestion. Negative for hearing loss, sore throat and tinnitus.   Eyes: Negative for blurred vision, double vision and redness.  Respiratory: Positive for cough and sputum production. Negative for hemoptysis and shortness of breath.   Cardiovascular: Positive for chest pain, claudication and leg swelling. Negative for palpitations and orthopnea.  Gastrointestinal: Negative for heartburn, nausea, vomiting, diarrhea, constipation and blood in stool.  Genitourinary: Negative for dysuria, urgency, frequency and hematuria.  Musculoskeletal: Positive for joint pain. Negative for myalgias, back pain, falls and neck pain.  Skin: Negative for itching.  Neurological: Negative for dizziness, tingling, tremors, focal weakness, seizures, loss of consciousness, weakness and headaches.  Endo/Heme/Allergies: Does not bruise/bleed easily.  Psychiatric/Behavioral: Negative for depression and substance abuse. The patient is not nervous/anxious and does not have insomnia.      Objective  Filed Vitals:   09/13/15 0912  BP: 118/62  Pulse: 65  Temp: 98 F (36.7 C)  Resp: 16  Height: 5\' 6"  (1.676 m)  Weight: 177 lb (80.287 kg)  SpO2: 98%     Physical Exam  Constitutional: He is oriented to person, place, and time and well-developed, well-nourished, and in no distress.  HENT:  Head: Normocephalic.  Mild nasal congestion noted.  Eyes:  EOM are normal. Pupils are equal, round, and reactive to light.  Neck: Normal range of motion. Neck supple. No thyromegaly present.  Cardiovascular: Normal rate, regular rhythm and normal heart sounds.   No murmur heard. Bruits are noted in both carotids over the femoral area as well as a renal arteries.  Pulmonary/Chest: Effort normal and breath sounds normal. No respiratory distress. He has no wheezes.  Abdominal: Soft. Bowel sounds are normal.  Musculoskeletal: Normal range of motion. He exhibits edema.  Lymphadenopathy:    He has no cervical adenopathy.  Neurological: He is alert and oriented to person, place, and time. No cranial nerve deficit. Gait normal. Coordination normal.  Skin: Skin is warm and dry. No rash noted.  Psychiatric: Affect and judgment normal.      Assessment & Plan  1. Type 2 diabetes mellitus with other specified complication (HCC) Well-controlled - POCT Glucose (CBG) - POCT  HgB A1C  2. Controlled type 2 diabetes mellitus with complication, without long-term current use of insulin ([place]) Well-controlled  3. Peripheral vascular disease ([place]) stable  4. Hyperlipidemia Well-controlled  5. Essential hypertension Well-controlledwell-controlled  6. Need for influenza vaccination given - Flu vaccine HIGH DOSE PF (Fluzone High dose)  7. Need for pneumococcal vaccination given - Pneumococcal polysaccharide vaccine 23-valent greater than or equal to 2yo subcutaneous/IM  8. Upper respiratory infection Treated as below - azithromycin (ZITHROMAX) 250 MG tablet; Follow package directions  Dispense: 6 tablet; Refill: 0 - fluticasone (FLONASE) 50 MCG/ACT nasal spray; Place 2 sprays into both nostrils daily.  Dispense: 16 g; Refill: 6  9. Chronic kidney disease, stage IV (severe) (HCC) Stable per nephrologist  10. Type 2 diabetes mellitus with stage 2 chronic kidney disease, without long-term current use of insulin [facility]) Per nephrologist  stable

## 2015-09-19 ENCOUNTER — Other Ambulatory Visit: Payer: Self-pay | Admitting: Family Medicine

## 2015-09-20 DIAGNOSIS — I129 Hypertensive chronic kidney disease with stage 1 through stage 4 chronic kidney disease, or unspecified chronic kidney disease: Secondary | ICD-10-CM | POA: Diagnosis not present

## 2015-09-20 DIAGNOSIS — N183 Chronic kidney disease, stage 3 (moderate): Secondary | ICD-10-CM | POA: Diagnosis not present

## 2015-09-20 DIAGNOSIS — E875 Hyperkalemia: Secondary | ICD-10-CM | POA: Diagnosis not present

## 2015-09-20 DIAGNOSIS — R600 Localized edema: Secondary | ICD-10-CM | POA: Diagnosis not present

## 2015-09-20 DIAGNOSIS — D631 Anemia in chronic kidney disease: Secondary | ICD-10-CM | POA: Diagnosis not present

## 2015-10-10 ENCOUNTER — Other Ambulatory Visit: Payer: Self-pay | Admitting: Family Medicine

## 2015-10-11 ENCOUNTER — Encounter: Payer: Self-pay | Admitting: Family Medicine

## 2015-10-11 ENCOUNTER — Ambulatory Visit (INDEPENDENT_AMBULATORY_CARE_PROVIDER_SITE_OTHER): Payer: Medicare PPO | Admitting: Family Medicine

## 2015-10-11 VITALS — BP 130/62 | HR 68 | Temp 98.1°F | Resp 18 | Ht 66.0 in | Wt 176.2 lb

## 2015-10-11 DIAGNOSIS — I701 Atherosclerosis of renal artery: Secondary | ICD-10-CM

## 2015-10-11 DIAGNOSIS — Z0000 Encounter for general adult medical examination without abnormal findings: Secondary | ICD-10-CM

## 2015-10-11 DIAGNOSIS — Z862 Personal history of diseases of the blood and blood-forming organs and certain disorders involving the immune mechanism: Secondary | ICD-10-CM | POA: Diagnosis not present

## 2015-10-11 DIAGNOSIS — I2510 Atherosclerotic heart disease of native coronary artery without angina pectoris: Secondary | ICD-10-CM | POA: Diagnosis not present

## 2015-10-11 DIAGNOSIS — N184 Chronic kidney disease, stage 4 (severe): Secondary | ICD-10-CM

## 2015-10-11 DIAGNOSIS — I779 Disorder of arteries and arterioles, unspecified: Secondary | ICD-10-CM | POA: Diagnosis not present

## 2015-10-11 DIAGNOSIS — D631 Anemia in chronic kidney disease: Secondary | ICD-10-CM

## 2015-10-11 DIAGNOSIS — I739 Peripheral vascular disease, unspecified: Secondary | ICD-10-CM | POA: Diagnosis not present

## 2015-10-11 DIAGNOSIS — E1122 Type 2 diabetes mellitus with diabetic chronic kidney disease: Secondary | ICD-10-CM | POA: Diagnosis not present

## 2015-10-11 DIAGNOSIS — N185 Chronic kidney disease, stage 5: Secondary | ICD-10-CM

## 2015-10-11 DIAGNOSIS — I10 Essential (primary) hypertension: Secondary | ICD-10-CM | POA: Diagnosis not present

## 2015-10-11 DIAGNOSIS — E785 Hyperlipidemia, unspecified: Secondary | ICD-10-CM | POA: Diagnosis not present

## 2015-10-11 DIAGNOSIS — Z8679 Personal history of other diseases of the circulatory system: Secondary | ICD-10-CM | POA: Insufficient documentation

## 2015-10-11 DIAGNOSIS — E7800 Pure hypercholesterolemia, unspecified: Secondary | ICD-10-CM | POA: Insufficient documentation

## 2015-10-11 NOTE — Progress Notes (Signed)
Name: Samuel Bryan.   MRN: [id number]    DOB: [birth year]   Date:10/11/2015       Progress Note  Subjective  Chief Complaint  Chief Complaint  Patient presents with  . Annual Exam    HPI  81 year old presenting for annual Medicare physical. Baseline problems are stable  Depression screen [facility] 2/9 10/11/2015 09/13/2015 05/09/2015  Decreased Interest 0 0 0  Down, Depressed, Hopeless - 0 0  PHQ - 2 Score 0 0 0    . Fall Risk  10/11/2015 09/13/2015 05/09/2015  Falls in the past year? No No No   Functional Status Survey: Is the patient deaf or have difficulty hearing?: Yes Does the patient have difficulty seeing, even when wearing glasses/contacts?: No Does the patient have difficulty concentrating, remembering, or making decisions?: No Does the patient have difficulty walking or climbing stairs?: No Does the patient have difficulty dressing or bathing?: No Does the patient have difficulty doing errands alone such as visiting a doctor's office or shopping?: Yes  Past Medical History  Diagnosis Date  . Type 2 diabetes mellitus ([place])   . Peripheral vascular disease, unspecified ([place])     mild lifestyle limiting claudication  . Occlusion and stenosis of carotid artery without mention of cerebral infarction     moderate left ICA stenosis  . Essential hypertension, benign   . Pure hypercholesterolemia   . Atherosclerosis of renal artery [facility])     Social History  Substance Use Topics  . Smoking status: Former Smoker    Types: Cigarettes    Quit date: 10/29/1970  . Smokeless tobacco: Not on file  . Alcohol Use: No     Current outpatient prescriptions:  .  aspirin 81 MG tablet, Take 81 mg by mouth daily.  , Disp: , Rfl:  .  aspirin EC 81 MG tablet, Take by mouth., Disp: , Rfl:  .  atorvastatin (LIPITOR) 40 MG tablet, Take 40 mg by mouth daily.  , Disp: , Rfl:  .  azithromycin (ZITHROMAX) 250 MG tablet, Follow package directions, Disp: 6 tablet, Rfl: 0 .  bisoprolol  (ZEBETA) 5 MG tablet, Take 5 mg by mouth daily.  , Disp: , Rfl:  .  cilostazol (PLETAL) 100 MG tablet, 1 Tablet, Oral, two times daily, Disp: 60 tablet, Rfl: 0 .  cilostazol (PLETAL) 100 MG tablet, Take 1 tablet (100 mg total) by mouth 2 (two) times daily., Disp: 60 tablet, Rfl: 5 .  citalopram (CELEXA) 10 MG tablet, Take 1 tablet (10 mg total) by mouth daily., Disp: 30 tablet, Rfl: 0 .  COMBIGAN 0.2-0.5 % ophthalmic solution, , Disp: , Rfl:  .  doxazosin (CARDURA) 8 MG tablet, Take 8 mg by mouth at bedtime.  , Disp: , Rfl:  .  fenofibrate (TRICOR) 145 MG tablet, Take 1 tablet (145 mg total) by mouth daily., Disp: 30 tablet, Rfl: 0 .  fluticasone (FLONASE) 50 MCG/ACT nasal spray, Place 2 sprays into both nostrils daily., Disp: 16 g, Rfl: 6 .  furosemide (LASIX) 20 MG tablet, Take by mouth., Disp: , Rfl:  .  hydrochlorothiazide 25 MG tablet, Take 25 mg by mouth daily.  , Disp: , Rfl:  .  latanoprost (XALATAN) 0.005 % ophthalmic solution, , Disp: , Rfl:  .  losartan (COZAAR) 50 MG tablet, , Disp: , Rfl:  .  Omega-3 Fatty Acids (FISH OIL) 1000 MG CAPS, Take 2 capsules by mouth daily.  , Disp: , Rfl:  .  Phenyleph-Chlorphen-Hydrocod (HYDROCODONE-PE-CHLORPHENIRAMIN PO), Take by mouth.  USE AS DIRECTED FOR COUGH, Disp: , Rfl:   No Known Allergies  Review of Systems  Constitutional: Negative for fever, chills and weight loss.  HENT: Positive for hearing loss. Negative for congestion, sore throat and tinnitus.   Eyes: Negative for blurred vision, double vision and redness.  Respiratory: Negative for cough, hemoptysis and shortness of breath.   Cardiovascular: Negative for chest pain, palpitations, orthopnea, claudication and leg swelling.  Gastrointestinal: Negative for heartburn, nausea, vomiting, diarrhea, constipation and blood in stool.  Genitourinary: Negative for dysuria, urgency, frequency and hematuria.  Musculoskeletal: Negative for myalgias, back pain, joint pain, falls and neck pain.   Skin: Negative for itching.  Neurological: Negative for dizziness, tingling, tremors, focal weakness, seizures, loss of consciousness, weakness and headaches.  Endo/Heme/Allergies: Does not bruise/bleed easily.  Psychiatric/Behavioral: Negative for depression and substance abuse. The patient is not nervous/anxious and does not have insomnia.      Objective  Filed Vitals:   10/11/15 0836  BP: 130/62  Pulse: 68  Temp: 98.1 F (36.7 C)  Resp: 18  Height: 5\' 6"  (1.676 m)  Weight: 176 lb 3 oz (79.918 kg)  SpO2: 97%     Physical Exam  Constitutional: He is oriented to person, place, and time and well-developed, well-nourished, and in no distress.  HENT:  Head: Normocephalic.  Eyes: EOM are normal. Pupils are equal, round, and reactive to light.  Neck: Normal range of motion. Neck supple. No thyromegaly present.  Cardiovascular: Normal rate, regular rhythm and normal heart sounds.   No murmur heard. Pulmonary/Chest: Effort normal and breath sounds normal. No respiratory distress. He has no wheezes.  Abdominal: Soft. Bowel sounds are normal.  Genitourinary: Rectum normal, prostate normal and penis normal. Guaiac negative stool. No discharge found.  Musculoskeletal: Normal range of motion. He exhibits no edema.  Lymphadenopathy:    He has no cervical adenopathy.  Neurological: He is alert and oriented to person, place, and time. No cranial nerve deficit. Gait normal. Coordination normal.  Skin: Skin is warm and dry. No rash noted.  Psychiatric: Affect and judgment normal.      Assessment & Plan  1. Annual physical exam   2. RENAL ATHEROSCLEROSIS   3. Peripheral vascular disease ([place])   4. Essential hypertension   5. Atherosclerosis of native coronary artery of native heart without angina pectoris   6. Bilateral carotid artery disease ([place])   7. Type 2 diabetes mellitus with stage 5 chronic kidney disease not on chronic dialysis, without long-term current use of  insulin ([place])   8. Chronic kidney disease, stage IV (severe) ([place])   9. Hyperlipidemia   10. ANEMIA, HX OF   11. Anemia associated with chronic renal failure, stage 5 (HCC)

## 2015-10-17 ENCOUNTER — Other Ambulatory Visit: Payer: Self-pay | Admitting: Family Medicine

## 2015-10-25 DIAGNOSIS — M50323 Other cervical disc degeneration at C6-C7 level: Secondary | ICD-10-CM | POA: Diagnosis not present

## 2015-10-25 DIAGNOSIS — I2510 Atherosclerotic heart disease of native coronary artery without angina pectoris: Secondary | ICD-10-CM | POA: Diagnosis not present

## 2015-10-25 DIAGNOSIS — N183 Chronic kidney disease, stage 3 (moderate): Secondary | ICD-10-CM | POA: Diagnosis not present

## 2015-10-25 DIAGNOSIS — S3992XA Unspecified injury of lower back, initial encounter: Secondary | ICD-10-CM | POA: Diagnosis not present

## 2015-10-25 DIAGNOSIS — D62 Acute posthemorrhagic anemia: Secondary | ICD-10-CM | POA: Diagnosis not present

## 2015-10-25 DIAGNOSIS — I6522 Occlusion and stenosis of left carotid artery: Secondary | ICD-10-CM | POA: Diagnosis not present

## 2015-10-25 DIAGNOSIS — T797XXA Traumatic subcutaneous emphysema, initial encounter: Secondary | ICD-10-CM | POA: Diagnosis not present

## 2015-10-25 DIAGNOSIS — R9431 Abnormal electrocardiogram [ECG] [EKG]: Secondary | ICD-10-CM | POA: Diagnosis not present

## 2015-10-25 DIAGNOSIS — J929 Pleural plaque without asbestos: Secondary | ICD-10-CM | POA: Diagnosis not present

## 2015-10-25 DIAGNOSIS — R571 Hypovolemic shock: Secondary | ICD-10-CM | POA: Diagnosis not present

## 2015-10-25 DIAGNOSIS — M5136 Other intervertebral disc degeneration, lumbar region: Secondary | ICD-10-CM | POA: Diagnosis not present

## 2015-10-25 DIAGNOSIS — I4581 Long QT syndrome: Secondary | ICD-10-CM | POA: Diagnosis not present

## 2015-10-25 DIAGNOSIS — M19042 Primary osteoarthritis, left hand: Secondary | ICD-10-CM | POA: Diagnosis not present

## 2015-10-25 DIAGNOSIS — I4940 Unspecified premature depolarization: Secondary | ICD-10-CM | POA: Diagnosis not present

## 2015-10-25 DIAGNOSIS — Z4901 Encounter for fitting and adjustment of extracorporeal dialysis catheter: Secondary | ICD-10-CM | POA: Diagnosis not present

## 2015-10-25 DIAGNOSIS — R4182 Altered mental status, unspecified: Secondary | ICD-10-CM | POA: Diagnosis not present

## 2015-10-25 DIAGNOSIS — S0181XA Laceration without foreign body of other part of head, initial encounter: Secondary | ICD-10-CM | POA: Diagnosis not present

## 2015-10-25 DIAGNOSIS — N184 Chronic kidney disease, stage 4 (severe): Secondary | ICD-10-CM | POA: Diagnosis not present

## 2015-10-25 DIAGNOSIS — R0989 Other specified symptoms and signs involving the circulatory and respiratory systems: Secondary | ICD-10-CM | POA: Diagnosis not present

## 2015-10-25 DIAGNOSIS — D649 Anemia, unspecified: Secondary | ICD-10-CM | POA: Diagnosis not present

## 2015-10-25 DIAGNOSIS — S3993XA Unspecified injury of pelvis, initial encounter: Secondary | ICD-10-CM | POA: Diagnosis not present

## 2015-10-25 DIAGNOSIS — S62635A Displaced fracture of distal phalanx of left ring finger, initial encounter for closed fracture: Secondary | ICD-10-CM | POA: Diagnosis not present

## 2015-10-25 DIAGNOSIS — M5020 Other cervical disc displacement, unspecified cervical region: Secondary | ICD-10-CM | POA: Diagnosis not present

## 2015-10-25 DIAGNOSIS — J9600 Acute respiratory failure, unspecified whether with hypoxia or hypercapnia: Secondary | ICD-10-CM | POA: Diagnosis not present

## 2015-10-25 DIAGNOSIS — S0452XA Injury of facial nerve, left side, initial encounter: Secondary | ICD-10-CM | POA: Diagnosis not present

## 2015-10-25 DIAGNOSIS — S0990XA Unspecified injury of head, initial encounter: Secondary | ICD-10-CM | POA: Diagnosis not present

## 2015-10-25 DIAGNOSIS — S199XXA Unspecified injury of neck, initial encounter: Secondary | ICD-10-CM | POA: Diagnosis not present

## 2015-10-25 DIAGNOSIS — S0912XA Laceration of muscle and tendon of head, initial encounter: Secondary | ICD-10-CM | POA: Diagnosis not present

## 2015-10-25 DIAGNOSIS — R140 Abdominal distension (gaseous): Secondary | ICD-10-CM | POA: Diagnosis not present

## 2015-10-25 DIAGNOSIS — Z4682 Encounter for fitting and adjustment of non-vascular catheter: Secondary | ICD-10-CM | POA: Diagnosis not present

## 2015-10-25 DIAGNOSIS — R001 Bradycardia, unspecified: Secondary | ICD-10-CM | POA: Diagnosis not present

## 2015-10-25 DIAGNOSIS — S299XXA Unspecified injury of thorax, initial encounter: Secondary | ICD-10-CM | POA: Diagnosis not present

## 2015-10-25 DIAGNOSIS — I340 Nonrheumatic mitral (valve) insufficiency: Secondary | ICD-10-CM | POA: Diagnosis not present

## 2015-10-25 DIAGNOSIS — I10 Essential (primary) hypertension: Secondary | ICD-10-CM | POA: Diagnosis not present

## 2015-10-25 DIAGNOSIS — I6529 Occlusion and stenosis of unspecified carotid artery: Secondary | ICD-10-CM | POA: Diagnosis not present

## 2015-10-25 DIAGNOSIS — R34 Anuria and oliguria: Secondary | ICD-10-CM | POA: Diagnosis not present

## 2015-10-25 DIAGNOSIS — Z955 Presence of coronary angioplasty implant and graft: Secondary | ICD-10-CM | POA: Diagnosis not present

## 2015-10-25 DIAGNOSIS — S0993XA Unspecified injury of face, initial encounter: Secondary | ICD-10-CM | POA: Diagnosis not present

## 2015-10-25 DIAGNOSIS — I313 Pericardial effusion (noninflammatory): Secondary | ICD-10-CM | POA: Diagnosis not present

## 2015-10-25 DIAGNOSIS — E785 Hyperlipidemia, unspecified: Secondary | ICD-10-CM | POA: Diagnosis not present

## 2015-10-25 DIAGNOSIS — S0919XA Other specified injury of muscle and tendon of head, initial encounter: Secondary | ICD-10-CM | POA: Diagnosis not present

## 2015-10-25 DIAGNOSIS — I129 Hypertensive chronic kidney disease with stage 1 through stage 4 chronic kidney disease, or unspecified chronic kidney disease: Secondary | ICD-10-CM | POA: Diagnosis not present

## 2015-10-25 DIAGNOSIS — E1122 Type 2 diabetes mellitus with diabetic chronic kidney disease: Secondary | ICD-10-CM | POA: Diagnosis not present

## 2015-10-25 DIAGNOSIS — K8050 Calculus of bile duct without cholangitis or cholecystitis without obstruction: Secondary | ICD-10-CM | POA: Diagnosis not present

## 2015-10-25 DIAGNOSIS — R937 Abnormal findings on diagnostic imaging of other parts of musculoskeletal system: Secondary | ICD-10-CM | POA: Diagnosis not present

## 2015-10-25 DIAGNOSIS — M47814 Spondylosis without myelopathy or radiculopathy, thoracic region: Secondary | ICD-10-CM | POA: Diagnosis not present

## 2015-10-25 DIAGNOSIS — E1151 Type 2 diabetes mellitus with diabetic peripheral angiopathy without gangrene: Secondary | ICD-10-CM | POA: Diagnosis not present

## 2015-10-25 DIAGNOSIS — I517 Cardiomegaly: Secondary | ICD-10-CM | POA: Diagnosis not present

## 2015-10-25 DIAGNOSIS — N170 Acute kidney failure with tubular necrosis: Secondary | ICD-10-CM | POA: Diagnosis not present

## 2015-10-25 DIAGNOSIS — N179 Acute kidney failure, unspecified: Secondary | ICD-10-CM | POA: Diagnosis not present

## 2015-10-25 DIAGNOSIS — J90 Pleural effusion, not elsewhere classified: Secondary | ICD-10-CM | POA: Diagnosis not present

## 2015-10-25 DIAGNOSIS — I361 Nonrheumatic tricuspid (valve) insufficiency: Secondary | ICD-10-CM | POA: Diagnosis not present

## 2015-10-25 DIAGNOSIS — E872 Acidosis: Secondary | ICD-10-CM | POA: Diagnosis not present

## 2015-10-25 DIAGNOSIS — R918 Other nonspecific abnormal finding of lung field: Secondary | ICD-10-CM | POA: Diagnosis not present

## 2015-10-25 DIAGNOSIS — K8020 Calculus of gallbladder without cholecystitis without obstruction: Secondary | ICD-10-CM | POA: Diagnosis not present

## 2015-10-26 DIAGNOSIS — S0450XA Injury of facial nerve, unspecified side, initial encounter: Secondary | ICD-10-CM | POA: Insufficient documentation

## 2015-10-26 DIAGNOSIS — S0993XA Unspecified injury of face, initial encounter: Secondary | ICD-10-CM | POA: Insufficient documentation

## 2015-10-28 ENCOUNTER — Other Ambulatory Visit: Payer: Self-pay | Admitting: Family Medicine

## 2015-11-01 ENCOUNTER — Other Ambulatory Visit: Payer: Self-pay | Admitting: Family Medicine

## 2015-11-07 ENCOUNTER — Other Ambulatory Visit: Payer: Self-pay | Admitting: Family Medicine

## 2015-11-11 ENCOUNTER — Encounter: Payer: Self-pay | Admitting: Family Medicine

## 2015-11-11 ENCOUNTER — Ambulatory Visit (INDEPENDENT_AMBULATORY_CARE_PROVIDER_SITE_OTHER): Payer: Medicare Other | Admitting: Family Medicine

## 2015-11-11 VITALS — BP 122/84 | HR 82 | Resp 16 | Ht 66.0 in | Wt 166.5 lb

## 2015-11-11 DIAGNOSIS — E46 Unspecified protein-calorie malnutrition: Secondary | ICD-10-CM | POA: Diagnosis not present

## 2015-11-11 DIAGNOSIS — N189 Chronic kidney disease, unspecified: Secondary | ICD-10-CM | POA: Diagnosis not present

## 2015-11-11 DIAGNOSIS — E1169 Type 2 diabetes mellitus with other specified complication: Secondary | ICD-10-CM | POA: Diagnosis not present

## 2015-11-11 DIAGNOSIS — T300 Burn of unspecified body region, unspecified degree: Secondary | ICD-10-CM

## 2015-11-11 DIAGNOSIS — I2510 Atherosclerotic heart disease of native coronary artery without angina pectoris: Secondary | ICD-10-CM | POA: Diagnosis not present

## 2015-11-11 MED ORDER — BRIMONIDINE TARTRATE 0.2 % OP SOLN: OPHTHALMIC | Status: DC

## 2015-11-11 MED ORDER — CILOSTAZOL 100 MG PO TABS: ORAL_TABLET | ORAL | Status: DC

## 2015-11-11 MED ORDER — FENOFIBRATE 145 MG PO TABS: ORAL_TABLET | ORAL | Status: DC

## 2015-11-11 MED ORDER — LATANOPROST 0.005 % OP SOLN: 1.0000 [drp] | Freq: Every day | OPHTHALMIC | Status: DC

## 2015-11-11 MED ORDER — TIMOLOL HEMIHYDRATE 0.5 % OP SOLN: 1.0000 [drp] | Freq: Two times a day (BID) | OPHTHALMIC | Status: AC

## 2015-11-12 LAB — COMPREHENSIVE METABOLIC PANEL
ALBUMIN: 4.1 g/dL (ref 3.5–4.7)
ALK PHOS: 84 IU/L (ref 39–117)
ALT: 14 IU/L (ref 0–44)
AST: 33 IU/L (ref 0–40)
Albumin/Globulin Ratio: 1.3 (ref 1.1–2.5)
BUN/Creatinine Ratio: 14 (ref 10–22)
BUN: 27 mg/dL (ref 8–27)
Bilirubin Total: 0.6 mg/dL (ref 0.0–1.2)
CALCIUM: 9.4 mg/dL (ref 8.6–10.2)
CO2: 24 mmol/L (ref 18–29)
CREATININE: 1.96 mg/dL — AB (ref 0.76–1.27)
Chloride: 98 mmol/L (ref 96–106)
GFR calc Af Amer: 35 mL/min/{1.73_m2} — ABNORMAL LOW (ref >59)
GFR, EST NON AFRICAN AMERICAN: 30 mL/min/{1.73_m2} — AB (ref >59)
GLUCOSE: 116 mg/dL — AB (ref 65–99)
Globulin, Total: 3.1 g/dL (ref 1.5–4.5)
Potassium: 5.1 mmol/L (ref 3.5–5.2)
Sodium: 141 mmol/L (ref 134–144)
Total Protein: 7.2 g/dL (ref 6.0–8.5)

## 2015-11-12 LAB — CBC
HEMATOCRIT: 32.7 % — AB (ref 37.5–51.0)
Hemoglobin: 10.6 g/dL — ABNORMAL LOW (ref 12.6–17.7)
MCH: 30.4 pg (ref 26.6–33.0)
MCHC: 32.4 g/dL (ref 31.5–35.7)
MCV: 94 fL (ref 79–97)
PLATELETS: 410 10*3/uL — AB (ref 150–379)
RBC: 3.49 x10E6/uL — AB (ref 4.14–5.80)
RDW: 15.7 % — AB (ref 12.3–15.4)
WBC: 5.2 10*3/uL (ref 3.4–10.8)

## 2015-11-12 LAB — TSH: TSH: 3.28 u[IU]/mL (ref 0.450–4.500)
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
## 2015-11-25 ENCOUNTER — Ambulatory Visit: Payer: Medicare PPO | Admitting: Family Medicine

## 2015-11-28 ENCOUNTER — Other Ambulatory Visit: Payer: Self-pay | Admitting: Family Medicine

## 2015-11-29 ENCOUNTER — Ambulatory Visit: Payer: Medicare PPO | Admitting: Family Medicine

## 2015-12-05 ENCOUNTER — Other Ambulatory Visit: Payer: Self-pay | Admitting: Family Medicine

## 2015-12-13 HISTORY — PX: FACIAL LACERATION REPAIR: SHX6589

## 2015-12-20 ENCOUNTER — Encounter: Payer: Self-pay | Admitting: Family Medicine

## 2015-12-20 ENCOUNTER — Ambulatory Visit (INDEPENDENT_AMBULATORY_CARE_PROVIDER_SITE_OTHER): Payer: Medicare Other | Admitting: Family Medicine

## 2015-12-20 VITALS — BP 126/62 | HR 80 | Temp 97.8°F | Resp 18 | Ht 66.0 in | Wt 157.5 lb

## 2015-12-20 DIAGNOSIS — S0452XS Injury of facial nerve, left side, sequela: Secondary | ICD-10-CM

## 2015-12-20 DIAGNOSIS — D631 Anemia in chronic kidney disease: Secondary | ICD-10-CM

## 2015-12-20 DIAGNOSIS — N189 Chronic kidney disease, unspecified: Secondary | ICD-10-CM | POA: Diagnosis not present

## 2015-12-20 DIAGNOSIS — T300 Burn of unspecified body region, unspecified degree: Secondary | ICD-10-CM | POA: Insufficient documentation

## 2015-12-20 DIAGNOSIS — N184 Chronic kidney disease, stage 4 (severe): Secondary | ICD-10-CM | POA: Diagnosis not present

## 2015-12-20 MED ORDER — BACITRACIN ZINC 500 UNIT/GM EX OINT: TOPICAL_OINTMENT | Freq: Two times a day (BID) | CUTANEOUS | Status: DC

## 2015-12-20 NOTE — Patient Instructions (Signed)
1) Use Bacitracin or Triple Antibiotic Cream (can also be found Over the counter) at the ends of facial scar until it heals up.   2) If the openings at the ends of the scar does not close up or it gets bigger follow up with [facility] specialist that was taking care of the scar.   3) Discuss anemia with kidney specialist. Anemia can be caused by chronic kidney disease.
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
## 2015-12-23 ENCOUNTER — Other Ambulatory Visit: Payer: Self-pay | Admitting: Family Medicine

## 2015-12-26 ENCOUNTER — Other Ambulatory Visit: Payer: Self-pay | Admitting: Family Medicine

## 2016-01-11 ENCOUNTER — Ambulatory Visit: Payer: Medicare PPO | Admitting: Family Medicine

## 2016-02-17 ENCOUNTER — Ambulatory Visit: Payer: Medicare Other | Admitting: Family Medicine

## 2016-03-05 ENCOUNTER — Other Ambulatory Visit: Payer: Self-pay | Admitting: Family Medicine

## 2016-03-23 ENCOUNTER — Encounter: Payer: Self-pay | Admitting: Family Medicine

## 2016-03-27 ENCOUNTER — Other Ambulatory Visit: Payer: Self-pay | Admitting: Family Medicine

## 2016-03-28 ENCOUNTER — Other Ambulatory Visit: Payer: Self-pay | Admitting: Family Medicine

## 2016-03-28 ENCOUNTER — Other Ambulatory Visit: Payer: Self-pay

## 2016-03-28 MED ORDER — ATORVASTATIN CALCIUM 40 MG PO TABS: 40.0000 mg | ORAL_TABLET | Freq: Every day | ORAL | Status: DC

## 2016-03-28 MED ORDER — CITALOPRAM HYDROBROMIDE 10 MG PO TABS: 10.0000 mg | ORAL_TABLET | Freq: Every day | ORAL | Status: DC

## 2016-03-28 NOTE — Telephone Encounter (Signed)
Pt needs Fenofibrate, Losartan, Citalopram and Atorzastain [phone]

## 2016-03-28 NOTE — Telephone Encounter (Signed)
Pts daughter called, not knowing what he needs refills on and is requesting refills on her dads meds. [employer].

## 2016-03-28 NOTE — Telephone Encounter (Signed)
Last note reviewed; last labs reviewed; [place] labs in media tab reviewed

## 2016-03-29 NOTE — Telephone Encounter (Signed)
I already sent the citalopram and atorvastatin yesterday I declined the fenofibrate; he should not be on both fenofibrate plus atorvastatin (new warning) so we'll talk about that at his appt We don't have a record of prescribing his ARB (losartan), so please ask him to get that from his kidney doctor from now on; they are managing his kidneys

## 2016-03-30 NOTE — Telephone Encounter (Signed)
Daughter notified 

## 2016-04-03 ENCOUNTER — Ambulatory Visit (INDEPENDENT_AMBULATORY_CARE_PROVIDER_SITE_OTHER): Payer: Medicare Other | Admitting: Family Medicine

## 2016-04-03 ENCOUNTER — Encounter: Payer: Self-pay | Admitting: Family Medicine

## 2016-04-03 VITALS — BP 138/62 | HR 88 | Temp 97.8°F | Resp 16 | Wt 148.0 lb

## 2016-04-03 DIAGNOSIS — R1901 Right upper quadrant abdominal swelling, mass and lump: Secondary | ICD-10-CM | POA: Diagnosis not present

## 2016-04-03 DIAGNOSIS — E785 Hyperlipidemia, unspecified: Secondary | ICD-10-CM | POA: Diagnosis not present

## 2016-04-03 DIAGNOSIS — Z5181 Encounter for therapeutic drug level monitoring: Secondary | ICD-10-CM | POA: Diagnosis not present

## 2016-04-03 DIAGNOSIS — R634 Abnormal weight loss: Secondary | ICD-10-CM

## 2016-04-03 DIAGNOSIS — N184 Chronic kidney disease, stage 4 (severe): Secondary | ICD-10-CM | POA: Diagnosis not present

## 2016-04-03 DIAGNOSIS — R0989 Other specified symptoms and signs involving the circulatory and respiratory systems: Secondary | ICD-10-CM | POA: Insufficient documentation

## 2016-04-03 DIAGNOSIS — I2510 Atherosclerotic heart disease of native coronary artery without angina pectoris: Secondary | ICD-10-CM | POA: Diagnosis not present

## 2016-04-03 DIAGNOSIS — E118 Type 2 diabetes mellitus with unspecified complications: Secondary | ICD-10-CM

## 2016-04-03 DIAGNOSIS — N185 Chronic kidney disease, stage 5: Secondary | ICD-10-CM | POA: Diagnosis not present

## 2016-04-03 DIAGNOSIS — I779 Disorder of arteries and arterioles, unspecified: Secondary | ICD-10-CM | POA: Diagnosis not present

## 2016-04-03 DIAGNOSIS — E1122 Type 2 diabetes mellitus with diabetic chronic kidney disease: Secondary | ICD-10-CM

## 2016-04-03 DIAGNOSIS — I739 Peripheral vascular disease, unspecified: Secondary | ICD-10-CM

## 2016-04-03 MED ORDER — CITALOPRAM HYDROBROMIDE 10 MG PO TABS: 10.0000 mg | ORAL_TABLET | Freq: Every day | ORAL | Status: DC

## 2016-04-03 MED ORDER — AMLODIPINE BESYLATE 5 MG PO TABS: 5.0000 mg | ORAL_TABLET | Freq: Every day | ORAL | Status: DC

## 2016-04-03 MED ORDER — ATORVASTATIN CALCIUM 40 MG PO TABS: 40.0000 mg | ORAL_TABLET | Freq: Every day | ORAL | Status: DC

## 2016-04-03 NOTE — Assessment & Plan Note (Addendum)
Sees kidney doctor on Thursday; dose meds accordingly; avoid NSAIDs

## 2016-04-03 NOTE — Assessment & Plan Note (Signed)
Foot exam by MD; eye doctor visit [facility]
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
## 2016-04-03 NOTE — Assessment & Plan Note (Signed)
Check labs 

## 2016-04-03 NOTE — Patient Instructions (Addendum)
Please do call Dr. Clayborn Bigness about the chest pain, as he may want to change medicine Please do follow-up with the vascular doctor and we'll request records Please have labs done today We'll get an ultrasound and then possibly a CT scan See the kidney doctor on Thursday Return in 2 weeks for follow-up

## 2016-04-09 ENCOUNTER — Other Ambulatory Visit: Payer: Self-pay | Admitting: Family Medicine

## 2016-04-09 ENCOUNTER — Ambulatory Visit
Admission: RE | Admit: 2016-04-09 | Discharge: 2016-04-09 | Disposition: A | Discharge status: Home / Self Care | Payer: Medicare Other | Source: Ambulatory Visit | Attending: Family Medicine | Admitting: Family Medicine

## 2016-04-09 DIAGNOSIS — I714 Abdominal aortic aneurysm, without rupture, unspecified: Secondary | ICD-10-CM

## 2016-04-09 DIAGNOSIS — R1901 Right upper quadrant abdominal swelling, mass and lump: Secondary | ICD-10-CM | POA: Insufficient documentation

## 2016-04-09 DIAGNOSIS — K8020 Calculus of gallbladder without cholecystitis without obstruction: Secondary | ICD-10-CM | POA: Diagnosis not present

## 2016-04-09 NOTE — Assessment & Plan Note (Signed)
US done, may be cholecystitis vs hernia; refer to surgeon ASAP

## 2016-04-10 LAB — CBC WITH DIFFERENTIAL/PLATELET
BASOS: 0 %
Basophils Absolute: 0 10*3/uL (ref 0.0–0.2)
EOS (ABSOLUTE): 0.3 10*3/uL (ref 0.0–0.4)
Eos: 8 %
Hematocrit: 31.8 % — ABNORMAL LOW (ref 37.5–51.0)
Hemoglobin: 10.4 g/dL — ABNORMAL LOW (ref 12.6–17.7)
IMMATURE GRANULOCYTES: 0 %
Immature Grans (Abs): 0 10*3/uL (ref 0.0–0.1)
Lymphocytes Absolute: 1.1 10*3/uL (ref 0.7–3.1)
Lymphs: 29 %
MCH: 31.5 pg (ref 26.6–33.0)
MCHC: 32.7 g/dL (ref 31.5–35.7)
MCV: 96 fL (ref 79–97)
MONOS ABS: 0.7 10*3/uL (ref 0.1–0.9)
Monocytes: 18 %
NEUTROS PCT: 45 %
Neutrophils Absolute: 1.7 10*3/uL (ref 1.4–7.0)
PLATELETS: 269 10*3/uL (ref 150–379)
RBC: 3.3 x10E6/uL — AB (ref 4.14–5.80)
RDW: 14.5 % (ref 12.3–15.4)
WBC: 3.8 10*3/uL (ref 3.4–10.8)

## 2016-04-10 LAB — COMPREHENSIVE METABOLIC PANEL
A/G RATIO: 1.3 (ref 1.2–2.2)
ALK PHOS: 72 IU/L (ref 39–117)
ALT: 16 IU/L (ref 0–44)
AST: 24 IU/L (ref 0–40)
Albumin: 3.5 g/dL (ref 3.5–4.7)
BUN/Creatinine Ratio: 15 (ref 10–24)
BUN: 19 mg/dL (ref 8–27)
Bilirubin Total: 0.3 mg/dL (ref 0.0–1.2)
CALCIUM: 9.2 mg/dL (ref 8.6–10.2)
CHLORIDE: 101 mmol/L (ref 96–106)
CO2: 25 mmol/L (ref 18–29)
Creatinine, Ser: 1.31 mg/dL — ABNORMAL HIGH (ref 0.76–1.27)
GFR calc Af Amer: 56 mL/min/{1.73_m2} — ABNORMAL LOW (ref >59)
GFR, EST NON AFRICAN AMERICAN: 49 mL/min/{1.73_m2} — AB (ref >59)
Globulin, Total: 2.8 g/dL (ref 1.5–4.5)
Glucose: 114 mg/dL — ABNORMAL HIGH (ref 65–99)
POTASSIUM: 4.7 mmol/L (ref 3.5–5.2)
Sodium: 137 mmol/L (ref 134–144)
Total Protein: 6.3 g/dL (ref 6.0–8.5)

## 2016-04-10 LAB — LIPID PANEL W/O CHOL/HDL RATIO
CHOLESTEROL TOTAL: 128 mg/dL (ref 100–199)
HDL: 43 mg/dL (ref >39)
LDL Calculated: 76 mg/dL (ref 0–99)
Triglycerides: 43 mg/dL (ref 0–149)
VLDL CHOLESTEROL CAL: 9 mg/dL (ref 5–40)

## 2016-04-10 LAB — HEMOGLOBIN A1C
Est. average glucose Bld gHb Est-mCnc: 131 mg/dL
Hgb A1c MFr Bld: 6.2 % — ABNORMAL HIGH (ref 4.8–5.6)

## 2016-04-10 LAB — TSH: TSH: 2.32 u[IU]/mL (ref 0.450–4.500)

## 2016-04-10 LAB — T4, FREE: FREE T4: 1.18 ng/dL (ref 0.82–1.77)

## 2016-04-11 ENCOUNTER — Ambulatory Visit (INDEPENDENT_AMBULATORY_CARE_PROVIDER_SITE_OTHER): Payer: Medicare Other | Admitting: General Surgery

## 2016-04-11 ENCOUNTER — Encounter: Payer: Self-pay | Admitting: General Surgery

## 2016-04-11 VITALS — BP 112/56 | HR 60 | Resp 14 | Ht 66.0 in | Wt 153.0 lb

## 2016-04-11 DIAGNOSIS — R1900 Intra-abdominal and pelvic swelling, mass and lump, unspecified site: Secondary | ICD-10-CM | POA: Insufficient documentation
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
## 2016-04-11 NOTE — Patient Instructions (Addendum)
The patient is aware to call back for any questions or concerns.  CT abdomen and pelvis.  Patient has been scheduled for a CT abdomen/pelvis with contrast at [place] for 04-19-16 at 8 am (arrive 7:45 am). Prep: NPO 4 hours prior and pick up prep kit. Patient verbalizes understanding.

## 2016-04-14 NOTE — Assessment & Plan Note (Signed)
Order [place] and refer to surgeon; check liver enzymes; worried that this may related to his weight loss

## 2016-04-14 NOTE — Assessment & Plan Note (Signed)
Encouraged pt to contact his cardiologist to notify him of intermittent chest pain, as he may wish to adjust medicine (long-acting nitrate, e.g.)

## 2016-04-14 NOTE — Assessment & Plan Note (Signed)
Known carotid athero; followed by vascular specialist

## 2016-04-14 NOTE — Assessment & Plan Note (Signed)
Noted in chart as early as 2012 I found; continue to follow with vascular specialist

## 2016-04-14 NOTE — Assessment & Plan Note (Signed)
Foot exam by MD today; monitor A1c every 6 months if less than 7; every 3 months if 7 or higher; control pressure, lipids

## 2016-04-17 ENCOUNTER — Telehealth: Payer: Self-pay | Admitting: Family Medicine

## 2016-04-17 ENCOUNTER — Encounter: Payer: Self-pay | Admitting: Family Medicine

## 2016-04-17 ENCOUNTER — Ambulatory Visit (INDEPENDENT_AMBULATORY_CARE_PROVIDER_SITE_OTHER): Payer: Medicare Other | Admitting: Family Medicine

## 2016-04-17 DIAGNOSIS — R1901 Right upper quadrant abdominal swelling, mass and lump: Secondary | ICD-10-CM

## 2016-04-17 NOTE — Telephone Encounter (Signed)
I see patient has a CT scan on June 22nd Could I see him just AFTER that scan? That would be most helpful, unless he absolutely needs to be seen today Thank you!

## 2016-04-17 NOTE — Telephone Encounter (Signed)
Quite all right; I escorted him to the front, but all staff were busy; please call him and get him an appointment for next week; they said it's okay to leave on voicemail if needed; please cancel today's visit; thank you

## 2016-04-17 NOTE — Telephone Encounter (Signed)
Appointment scheduled for 04-27-16. And I have documented not to collect copay at next visit. The copay that we collected today will apply to 04-27-16 visit.

## 2016-04-17 NOTE — Telephone Encounter (Signed)
I do apologize. I just saw the message and he had already been checked in.

## 2016-04-19 ENCOUNTER — Ambulatory Visit
Admission: RE | Admit: 2016-04-19 | Discharge: 2016-04-19 | Disposition: A | Discharge status: Home / Self Care | Payer: Medicare Other | Source: Ambulatory Visit | Attending: General Surgery | Admitting: General Surgery

## 2016-04-19 DIAGNOSIS — R1900 Intra-abdominal and pelvic swelling, mass and lump, unspecified site: Secondary | ICD-10-CM

## 2016-04-19 DIAGNOSIS — I700 Atherosclerosis of aorta: Secondary | ICD-10-CM | POA: Diagnosis not present

## 2016-04-19 DIAGNOSIS — K8020 Calculus of gallbladder without cholecystitis without obstruction: Secondary | ICD-10-CM | POA: Insufficient documentation

## 2016-04-19 DIAGNOSIS — I2510 Atherosclerotic heart disease of native coronary artery without angina pectoris: Secondary | ICD-10-CM | POA: Diagnosis not present

## 2016-04-19 MED ORDER — IOPAMIDOL (ISOVUE-300) INJECTION 61%
80.0000 mL | Freq: Once | INTRAVENOUS | Status: AC | PRN
  Administered 2016-04-19: 80 mL via INTRAVENOUS

## 2016-04-24 ENCOUNTER — Telehealth: Payer: Self-pay

## 2016-04-24 NOTE — Telephone Encounter (Signed)
-----   Message from [name], MD sent at 04/23/2016  4:55 PM EDT ----- Please let the patient's daughter know that I have reviewed the CT. No mass. No enlarged lymph glands. No aneurysm. Unless her father develops any specific symptoms no additional studies or follow-up is required. ----- Message -----    From: Rad Results In Interface    Sent: 04/19/2016   9:50 AM      To: [name], MD

## 2016-04-24 NOTE — Telephone Encounter (Signed)
Notified patient as instructed, patient pleased. Discussed no need for follow up unless specific symptoms, patient agrees.

## 2016-04-27 ENCOUNTER — Ambulatory Visit: Payer: Medicare Other | Admitting: Family Medicine

## 2016-04-30 NOTE — Progress Notes (Signed)
Left voice message for patient to schedule appointment.

## 2016-05-20 NOTE — Assessment & Plan Note (Signed)
Scan in two days; will reschedule visit AFTER scan

## 2016-05-20 NOTE — Progress Notes (Signed)
Patient was not seen today; he has not had his CT scan, due to occur in a few days, so we'll put off his visit after his scan

## 2016-07-30 ENCOUNTER — Ambulatory Visit: Payer: Medicare Other | Admitting: Family Medicine

## 2016-08-08 ENCOUNTER — Encounter: Payer: Self-pay | Admitting: Family Medicine

## 2016-08-08 ENCOUNTER — Ambulatory Visit (INDEPENDENT_AMBULATORY_CARE_PROVIDER_SITE_OTHER): Payer: Medicare Other | Admitting: Family Medicine

## 2016-08-08 VITALS — BP 138/64 | HR 81 | Temp 98.2°F | Resp 14 | Wt 158.0 lb

## 2016-08-08 DIAGNOSIS — I10 Essential (primary) hypertension: Secondary | ICD-10-CM | POA: Diagnosis not present

## 2016-08-08 DIAGNOSIS — Z23 Encounter for immunization: Secondary | ICD-10-CM

## 2016-08-08 DIAGNOSIS — I714 Abdominal aortic aneurysm, without rupture, unspecified: Secondary | ICD-10-CM

## 2016-08-08 DIAGNOSIS — E782 Mixed hyperlipidemia: Secondary | ICD-10-CM

## 2016-08-08 DIAGNOSIS — I739 Peripheral vascular disease, unspecified: Secondary | ICD-10-CM | POA: Diagnosis not present

## 2016-08-08 DIAGNOSIS — N183 Chronic kidney disease, stage 3 unspecified: Secondary | ICD-10-CM

## 2016-08-08 DIAGNOSIS — I2510 Atherosclerotic heart disease of native coronary artery without angina pectoris: Secondary | ICD-10-CM | POA: Diagnosis not present

## 2016-08-08 DIAGNOSIS — Z5181 Encounter for therapeutic drug level monitoring: Secondary | ICD-10-CM | POA: Diagnosis not present

## 2016-08-08 DIAGNOSIS — R7301 Impaired fasting glucose: Secondary | ICD-10-CM | POA: Insufficient documentation

## 2016-08-08 DIAGNOSIS — R0989 Other specified symptoms and signs involving the circulatory and respiratory systems: Secondary | ICD-10-CM

## 2016-08-08 DIAGNOSIS — D631 Anemia in chronic kidney disease: Secondary | ICD-10-CM

## 2016-08-08 LAB — COMPLETE METABOLIC PANEL WITH GFR
ALT: 13 U/L (ref 9–46)
AST: 20 U/L (ref 10–35)
Albumin: 3.8 g/dL (ref 3.6–5.1)
Alkaline Phosphatase: 68 U/L (ref 40–115)
BUN: 19 mg/dL (ref 7–25)
CHLORIDE: 105 mmol/L (ref 98–110)
CO2: 26 mmol/L (ref 20–31)
Calcium: 9.2 mg/dL (ref 8.6–10.3)
Creat: 1.51 mg/dL — ABNORMAL HIGH (ref 0.70–1.11)
GFR, EST AFRICAN AMERICAN: 47 mL/min — AB (ref >=60)
GFR, EST NON AFRICAN AMERICAN: 41 mL/min — AB (ref >=60)
Glucose, Bld: 111 mg/dL — ABNORMAL HIGH (ref 65–99)
Potassium: 4.9 mmol/L (ref 3.5–5.3)
Sodium: 140 mmol/L (ref 135–146)
Total Bilirubin: 0.6 mg/dL (ref 0.2–1.2)
Total Protein: 6.8 g/dL (ref 6.1–8.1)

## 2016-08-08 LAB — CBC WITH DIFFERENTIAL/PLATELET
BASOS ABS: 0 {cells}/uL (ref 0–200)
BASOS PCT: 0 %
EOS ABS: 180 {cells}/uL (ref 15–500)
Eosinophils Relative: 4 %
HEMATOCRIT: 35.6 % — AB (ref 38.5–50.0)
HEMOGLOBIN: 11.4 g/dL — AB (ref 13.2–17.1)
LYMPHS ABS: 1260 {cells}/uL (ref 850–3900)
Lymphocytes Relative: 28 %
MCH: 31.2 pg (ref 27.0–33.0)
MCHC: 32 g/dL (ref 32.0–36.0)
MCV: 97.5 fL (ref 80.0–100.0)
MONO ABS: 630 {cells}/uL (ref 200–950)
MPV: 10.5 fL (ref 7.5–12.5)
Monocytes Relative: 14 %
NEUTROS ABS: 2430 {cells}/uL (ref 1500–7800)
Neutrophils Relative %: 54 %
PLATELETS: 235 10*3/uL (ref 140–400)
RBC: 3.65 MIL/uL — ABNORMAL LOW (ref 4.20–5.80)
RDW: 13.7 % (ref 11.0–15.0)
WBC: 4.5 10*3/uL (ref 3.8–10.8)

## 2016-08-08 LAB — LIPID PANEL
CHOL/HDL RATIO: 2.4 ratio (ref <=5.0)
Cholesterol: 141 mg/dL (ref 125–200)
HDL: 60 mg/dL (ref >=40)
LDL CALC: 72 mg/dL (ref <130)
TRIGLYCERIDES: 47 mg/dL (ref <150)
VLDL: 9 mg/dL (ref <30)

## 2016-08-08 NOTE — Assessment & Plan Note (Signed)
Continue statin; try to limit saturated fats 

## 2016-08-08 NOTE — Assessment & Plan Note (Signed)
Follow up with vascular doctor

## 2016-08-08 NOTE — Assessment & Plan Note (Signed)
Keep f/u with vascular doctor; explained the tiredness in his legs may be secondary to his circulation; daughter will call vasc to schedule appt

## 2016-08-08 NOTE — Progress Notes (Signed)
BP 138/64   Pulse 81   Temp 98.2 F (36.8 C) (Oral)   Resp 14   Wt 158 lb (71.7 kg)   SpO2 98%   BMI 25.50 kg/m    Subjective:    Patient ID: Samuel Bryan., male    DOB: Mar 22, 1927, 82 y.o.   MRN: [id number]  HPI: Samuel Bryan. is a 82 y.o. male  Chief Complaint  Patient presents with  . Medication Refill   Patient is here with his daughter Overall feeling good; not losing weight; appetite is pretty good Legs get tired, knees get tired when walking for a long ways; not getting winded; no CP with exertion No RUQ with eating or after fatty foods Takes aspirin He saw Dr. Bary Castilla for concern about possible abdominal mass and weight loss; US done, CT scan done; reviewed with them today He saw Dr. Clayborn Bigness, cardiologist; known CAD; he denies chest pain Prediabetes; daughter says he does not have diabetes; he does eat some white bread; not much white rice; drinks water, no sodas; does eat white potatoes High cholesterol; some hot dogs; not much cheese; taking statin He has carotid athero and sees vascular; daughter does not know which one, thinks they were supposed to have had an appt already; she'll check with them Gallstones present on CT scan; he denies any RUQ pain with eating ------------------------------------------ Excerpt from CT scan in June 2017:  IMPRESSION: 1. No abdominal mass or adenopathy is seen. The area in question may have represented feces filled right colon. 2. Significant coronary artery calcifications. 3. Aortic atherosclerosis. 4. Multiple gallstones. 5. Question right sacroiliitis.  Correlate clinically.  Electronically Signed   By: Ivar Drape M.D.   On: 04/19/2016 09:47 -------------------------------------------- Depression screen [employer] 2/9 08/08/2016 04/03/2016 12/20/2015 11/11/2015 10/11/2015  Decreased Interest 0 0 0 0 0  Down, Depressed, Hopeless 0 1 0 0 -  PHQ - 2 Score 0 1 0 0 0   Relevant past medical, surgical, family and social  history reviewed Past Medical History:  Diagnosis Date  . Atherosclerosis of renal artery ([place])   . Essential hypertension, benign   . Occlusion and stenosis of carotid artery without mention of cerebral infarction    moderate left ICA stenosis  . Peripheral vascular disease, unspecified    mild lifestyle limiting claudication  . Pure hypercholesterolemia   . Type 2 diabetes mellitus ([place])    Past Surgical History:  Procedure Laterality Date  . APPENDECTOMY  1970's  . COLONOSCOPY    . coronary artery stenting  1999   at [facility]   . renal angiography     Family History  Problem Relation Age of Onset  . Diabetes Mother   . Hypertension Brother   . Hyperlipidemia Brother   . Diabetes Brother   . Hypertension Son    Social History  Substance Use Topics  . Smoking status: Former Smoker    Types: Cigarettes    Quit date: 10/29/1970  . Smokeless tobacco: Never Used  . Alcohol use No   Interim medical history since last visit reviewed. Allergies and medications reviewed  Review of Systems Per HPI unless specifically indicated above     Objective:    BP 138/64   Pulse 81   Temp 98.2 F (36.8 C) (Oral)   Resp 14   Wt 158 lb (71.7 kg)   SpO2 98%   BMI 25.50 kg/m   Wt Readings from Last 3 Encounters:  08/08/16 158 lb (71.7 kg)  04/17/16 151 lb (68.5 kg)  04/11/16 153 lb (69.4 kg)    Physical Exam  Constitutional: He appears well-developed and well-nourished. No distress.  HENT:  Head: Normocephalic and atraumatic.  Eyes: EOM are normal. No scleral icterus.  Neck: No JVD present. No thyromegaly present.  Cardiovascular: Normal rate and regular rhythm.   Pulmonary/Chest: Effort normal and breath sounds normal.  Abdominal: Soft. Bowel sounds are normal. He exhibits no distension. There is no tenderness. There is no guarding.  Musculoskeletal: He exhibits no edema.       Right knee: He exhibits no swelling and no effusion. No tenderness found.       Left knee: He  exhibits no swelling and no effusion. No tenderness found.  Mild crepitus both knees, left greater than right  Neurological: Coordination normal.  Skin: Skin is warm and dry. No pallor.  Scar left cheek  Psychiatric: He has a normal mood and affect. His behavior is normal. Judgment and thought content normal. His mood appears not anxious. He does not exhibit a depressed mood.   Results for orders placed or performed in visit on 04/03/16  Hemoglobin A1c  Result Value Ref Range   Hgb A1c MFr Bld 6.2 (H) 4.8 - 5.6 %   Est. average glucose Bld gHb Est-mCnc 131 mg/dL  CBC with Differential/Platelet  Result Value Ref Range   WBC 3.8 3.4 - 10.8 x10E3/uL   RBC 3.30 (L) 4.14 - 5.80 x10E6/uL   Hemoglobin 10.4 (L) 12.6 - 17.7 g/dL   Hematocrit 31.8 (L) 37.5 - 51.0 %   MCV 96 79 - 97 fL   MCH 31.5 26.6 - 33.0 pg   MCHC 32.7 31.5 - 35.7 g/dL   RDW 14.5 12.3 - 15.4 %   Platelets 269 150 - 379 x10E3/uL   Neutrophils 45 %   Lymphs 29 %   Monocytes 18 %   Eos 8 %   Basos 0 %   Neutrophils Absolute 1.7 1.4 - 7.0 x10E3/uL   Lymphocytes Absolute 1.1 0.7 - 3.1 x10E3/uL   Monocytes Absolute 0.7 0.1 - 0.9 x10E3/uL   EOS (ABSOLUTE) 0.3 0.0 - 0.4 x10E3/uL   Basophils Absolute 0.0 0.0 - 0.2 x10E3/uL   Immature Granulocytes 0 %   Immature Grans (Abs) 0.0 0.0 - 0.1 x10E3/uL  Comprehensive metabolic panel  Result Value Ref Range   Glucose 114 (H) 65 - 99 mg/dL   BUN 19 8 - 27 mg/dL   Creatinine, Ser 1.31 (H) 0.76 - 1.27 mg/dL   GFR calc non Af Amer 49 (L) >59 mL/min/1.73   GFR calc Af Amer 56 (L) >59 mL/min/1.73   BUN/Creatinine Ratio 15 10 - 24   Sodium 137 134 - 144 mmol/L   Potassium 4.7 3.5 - 5.2 mmol/L   Chloride 101 96 - 106 mmol/L   CO2 25 18 - 29 mmol/L   Calcium 9.2 8.6 - 10.2 mg/dL   Total Protein 6.3 6.0 - 8.5 g/dL   Albumin 3.5 3.5 - 4.7 g/dL   Globulin, Total 2.8 1.5 - 4.5 g/dL   Albumin/Globulin Ratio 1.3 1.2 - 2.2   Bilirubin Total 0.3 0.0 - 1.2 mg/dL   Alkaline Phosphatase 72  39 - 117 IU/L   AST 24 0 - 40 IU/L   ALT 16 0 - 44 IU/L  T4, free  Result Value Ref Range   Free T4 1.18 0.82 - 1.77 ng/dL  TSH  Result Value Ref Range   TSH 2.320 0.450 - 4.500 uIU/mL  Lipid Panel w/o Chol/HDL Ratio  Result Value Ref Range   Cholesterol, Total 128 100 - 199 mg/dL   Triglycerides 43 0 - 149 mg/dL   HDL 43 >39 mg/dL   VLDL Cholesterol Cal 9 5 - 40 mg/dL   LDL Calculated 76 0 - 99 mg/dL      Assessment & Plan:   Problem List Items Addressed This Visit      Cardiovascular and Mediastinum   Peripheral vascular disease ([place]) (Chronic)    Keep f/u with vascular doctor; explained the tiredness in his legs may be secondary to his circulation; daughter will call vasc to schedule appt      Relevant Medications   losartan (COZAAR) 100 MG tablet   furosemide (LASIX) 40 MG tablet   Essential hypertension - Primary (Chronic)    Well-controlled today; try to limit salt; continue medicines      Relevant Medications   losartan (COZAAR) 100 MG tablet   furosemide (LASIX) 40 MG tablet   Coronary atherosclerosis (Chronic)    Continue aspirin, cholesterol medicine; healthy eating; f/u with heart doctor      Relevant Medications   losartan (COZAAR) 100 MG tablet   furosemide (LASIX) 40 MG tablet   AAA (abdominal aortic aneurysm) without rupture (HCC) (Chronic)    Discussed; rescan in June 2020; control pressure and lipids      Relevant Medications   losartan (COZAAR) 100 MG tablet   furosemide (LASIX) 40 MG tablet     Endocrine   Impaired fasting glucose (Chronic)    A1c controlled; avoid white bread and potatoes        Genitourinary   Anemia in chronic kidney disease (CKD)    Check CBC      Relevant Orders   CBC with Differential/Platelet     Other   Medication monitoring encounter    Check labs      Relevant Orders   COMPLETE METABOLIC PANEL WITH GFR   Hyperlipidemia (Chronic)    Continue statin; try to limit saturated fats      Relevant  Medications   losartan (COZAAR) 100 MG tablet   furosemide (LASIX) 40 MG tablet   Other Relevant Orders   Lipid panel   Bilateral carotid bruits    Follow-up with vascular doctor       Other Visit Diagnoses    Needs flu shot       Relevant Orders   Flu vaccine HIGH DOSE PF (Fluzone High dose) (Completed)      Follow up plan: Return in about 4 months (around 12/09/2016) for fasting labs and visit.  An after-visit summary was printed and given to the patient at [facility].  Please see the patient instructions which may contain other information and recommendations beyond what is mentioned above in the assessment and plan.  Meds ordered this encounter  Medications  . losartan (COZAAR) 100 MG tablet    Sig: Take 100 mg by mouth daily.  . furosemide (LASIX) 40 MG tablet    Sig: Take 40 mg by mouth daily.    Orders Placed This Encounter  Procedures  . Flu vaccine HIGH DOSE PF (Fluzone High dose)  . CBC with Differential/Platelet  . COMPLETE METABOLIC PANEL WITH GFR  . Lipid panel

## 2016-08-08 NOTE — Assessment & Plan Note (Signed)
Well-controlled today; try to limit salt; continue medicines

## 2016-08-08 NOTE — Assessment & Plan Note (Signed)
Check labs 

## 2016-08-08 NOTE — Assessment & Plan Note (Signed)
Check CBC 

## 2016-08-08 NOTE — Patient Instructions (Signed)
Check up front to see who his vascular doctor is and make an appointment Try to limit saturated fats in your diet (bologna, hot dogs, barbeque, cheeseburgers, hamburgers, steak, bacon, sausage, cheese, etc.) and get more fresh fruits, vegetables, and whole grains Your goal blood pressure is less than 140 mmHg on top. Try to follow the DASH guidelines (DASH stands for Dietary Approaches to Stop Hypertension) Try to limit the sodium in your diet.  Ideally, consume less than 1.5 grams (less than 1,500mg ) per day. Do not add salt when cooking or at the table.  Check the sodium amount on labels when shopping, and choose items lower in sodium when given a choice. Avoid or limit foods that already contain a lot of sodium. Eat a diet rich in fruits and vegetables and whole grains. Follow-up with Dr. Clayborn Bigness If you need something for aches or pains, try to use Tylenol (acetaminophen) instead of non-steroidals (which include Aleve, ibuprofen, Advil, Motrin, and naproxen); non-steroidals can cause long-term kidney damage

## 2016-08-08 NOTE — Assessment & Plan Note (Signed)
Discussed; rescan in June 2020; control pressure and lipids

## 2016-08-08 NOTE — Assessment & Plan Note (Signed)
A1c controlled; avoid white bread and potatoes

## 2016-08-08 NOTE — Assessment & Plan Note (Signed)
Continue aspirin, cholesterol medicine; healthy eating; f/u with heart doctor

## 2016-08-27 ENCOUNTER — Telehealth: Payer: Self-pay | Admitting: Family Medicine

## 2016-08-27 ENCOUNTER — Telehealth: Payer: Self-pay | Admitting: Cardiovascular Disease

## 2016-08-27 DIAGNOSIS — I701 Atherosclerosis of renal artery: Secondary | ICD-10-CM

## 2016-08-27 DIAGNOSIS — I779 Disorder of arteries and arterioles, unspecified: Secondary | ICD-10-CM

## 2016-08-27 DIAGNOSIS — I739 Peripheral vascular disease, unspecified: Secondary | ICD-10-CM

## 2016-08-27 NOTE — Telephone Encounter (Signed)
Reviewed chart and this pt is appropriate to follow-up with Dr Fletcher Anon in [place].   I spoke with [name] and due to issues with transportation the pt would like to transition care to the [place] office.  I provided [name] with the office phone number and she will contact the office to arrange an appointment with Dr Fletcher Anon.  The pt has not been seen by [facility] since 2015.

## 2016-08-27 NOTE — Assessment & Plan Note (Signed)
Refer to vasc

## 2016-08-27 NOTE — Telephone Encounter (Signed)
The chart documentation suggests that he has seen a vascular doctor, going back to as far back as 2012 or so; if he is not currently seeing a vascular doctor (Dr. Lucky Cowboy or Dr. Ronalee Belts) or other nearby vascular doctor, or he was lost to follow-up, I'll just put in a new referral to get this ball rolling

## 2016-08-27 NOTE — Telephone Encounter (Signed)
New message   Samuel Bryan verbalized that she wants the rn to call her because she wants to have the pt seen in [place] by a doctor that has the same specialities as Dr.Cooper

## 2016-08-27 NOTE — Telephone Encounter (Signed)
Pt would like to know who is the right doctor for him  To go see about the artery in his neck. Please advise.

## 2016-09-04 ENCOUNTER — Ambulatory Visit: Payer: 59 | Admitting: Cardiology

## 2016-09-04 ENCOUNTER — Ambulatory Visit: Payer: 59 | Admitting: Internal Medicine

## 2016-09-24 ENCOUNTER — Encounter (INDEPENDENT_AMBULATORY_CARE_PROVIDER_SITE_OTHER): Payer: Self-pay | Admitting: Vascular Surgery

## 2016-09-24 ENCOUNTER — Ambulatory Visit (INDEPENDENT_AMBULATORY_CARE_PROVIDER_SITE_OTHER): Payer: Medicare Other | Admitting: Vascular Surgery

## 2016-09-24 VITALS — BP 146/70 | HR 76 | Resp 16 | Ht 66.0 in | Wt 160.0 lb

## 2016-09-24 DIAGNOSIS — I739 Peripheral vascular disease, unspecified: Secondary | ICD-10-CM

## 2016-09-24 DIAGNOSIS — I10 Essential (primary) hypertension: Secondary | ICD-10-CM

## 2016-09-24 DIAGNOSIS — I2510 Atherosclerotic heart disease of native coronary artery without angina pectoris: Secondary | ICD-10-CM | POA: Diagnosis not present

## 2016-09-24 DIAGNOSIS — I779 Disorder of arteries and arterioles, unspecified: Secondary | ICD-10-CM | POA: Diagnosis not present

## 2016-09-24 NOTE — Progress Notes (Signed)
MRN : [id number]  Samuel Bryan. is a 82 y.o. (03-09-1927) male who presents with chief complaint of  Chief Complaint  Patient presents with  . New Patient (Initial Visit)  .  History of Present Illness:The patient is seen for evaluation of carotid stenosis. The carotid stenosis was identified after auscultation of a bruit.  The patient denies amaurosis fugax. There is no recent history of TIA symptoms or focal motor deficits. There is no prior documented CVA.  There is no history of migraine headaches. There is no history of seizures.  The patient is taking enteric-coated aspirin 81 mg daily.  The patient has a history of coronary artery disease, no recent episodes of angina or shortness of breath. The patient denies PAD or claudication symptoms. There is a history of hyperlipidemia which is being treated with a statin.   Current Meds  Medication Sig  . amLODipine (NORVASC) 5 MG tablet Take 1 tablet (5 mg total) by mouth daily.  Marland Kitchen aspirin EC 81 MG tablet Take by mouth.  Marland Kitchen atorvastatin (LIPITOR) 40 MG tablet Take 1 tablet (40 mg total) by mouth at bedtime.  . brimonidine (ALPHAGAN) 0.2 % ophthalmic solution 1 drop Three (3) times a day.  . citalopram (CELEXA) 10 MG tablet Take 1 tablet (10 mg total) by mouth daily.  . fluticasone (FLONASE) 50 MCG/ACT nasal spray Place 2 sprays into both nostrils daily. (Patient taking differently: Place 2 sprays into both nostrils as needed. )  . furosemide (LASIX) 40 MG tablet Take 40 mg by mouth daily.  Marland Kitchen losartan (COZAAR) 100 MG tablet Take 100 mg by mouth daily.  . Omega-3 Fatty Acids (FISH OIL) 1000 MG CAPS Take 2 capsules by mouth daily.    . timolol (BETIMOL) 0.5 % ophthalmic solution Place 1 drop into both eyes 2 (two) times daily.    Past Medical History:  Diagnosis Date  . Atherosclerosis of renal artery ([place])   . Essential hypertension, benign   . Occlusion and stenosis of carotid artery without mention of cerebral infarction      moderate left ICA stenosis  . Peripheral vascular disease, unspecified    mild lifestyle limiting claudication  . Pure hypercholesterolemia   . Type 2 diabetes mellitus ([place])     Past Surgical History:  Procedure Laterality Date  . APPENDECTOMY  1970's  . COLONOSCOPY    . coronary artery stenting  1999   at [facility]   . renal angiography      Social History Social History  Substance Use Topics  . Smoking status: Former Smoker    Types: Cigarettes    Quit date: 10/29/1970  . Smokeless tobacco: Never Used  . Alcohol use No    Family History Family History  Problem Relation Age of Onset  . Diabetes Mother   . Hypertension Brother   . Hyperlipidemia Brother   . Diabetes Brother   . Hypertension Son   No family history of bleeding/clotting disorders, porphyria or autoimmune disease   No Known Allergies   REVIEW OF SYSTEMS (Negative unless checked)  Constitutional: [] Weight loss  [] Fever  [] Chills Cardiac: [] Chest pain   [] Chest pressure   [] Palpitations   [] Shortness of breath when laying flat   [] Shortness of breath with exertion. Vascular:  [] Pain in legs with walking   [] Pain in legs at rest  [] History of DVT   [] Phlebitis   [] Swelling in legs   [] Varicose veins   [] Non-healing ulcers Pulmonary:   [] Uses home oxygen   []   Productive cough   [] Hemoptysis   [] Wheeze  [] COPD   [] Asthma Neurologic:  [] Dizziness   [] Seizures   [] History of stroke   [] History of TIA  [] Aphasia   [] Vissual changes   [] Weakness or numbness in arm   [] Weakness or numbness in leg Musculoskeletal:   [] Joint swelling   [] Joint pain   [] Low back pain Hematologic:  [] Easy bruising  [] Easy bleeding   [] Hypercoagulable state   [] Anemic Gastrointestinal:  [] Diarrhea   [] Vomiting  [] Gastroesophageal reflux/heartburn   [] Difficulty swallowing. Genitourinary:  [] Chronic kidney disease   [] Difficult urination  [] Frequent urination   [] Blood in urine Skin:  [] Rashes   [] Ulcers  Psychological:  [] History of  anxiety   []  History of major depression.  Physical Examination  Vitals:   09/24/16 0900  BP: (!) 146/70  Pulse: 76  Resp: 16  Weight: 160 lb (72.6 kg)  Height: 5\' 6"  (1.676 m)   Body mass index is 25.82 kg/m. Gen: WD/WN, NAD Head: [place]/AT, No temporalis wasting.  Ear/Nose/Throat: Hearing grossly intact, nares w/o erythema or drainage, poor dentition Eyes: PER, EOMI, sclera nonicteric.  Neck: Supple, no masses.  No bruit or JVD.  Pulmonary:  Good air movement, clear to auscultation bilaterally, no use of accessory muscles.  Cardiac: RRR, normal S1, S2, no Murmurs. Vascular: right carotid bruit Vessel Right Left  Radial Palpable Palpable  Ulnar Palpable Palpable  Brachial Palpable Palpable  Carotid Palpable Palpable  Femoral Palpable Palpable  Popliteal Palpable Palpable  PT Palpable Palpable  DP Palpable Palpable   Gastrointestinal: soft, non-distended. No guarding/no peritoneal signs.  Musculoskeletal: M/S 5/5 throughout.  No deformity or atrophy.  Neurologic: CN 2-12 intact. Pain and light touch intact in extremities.  Symmetrical.  Speech is fluent. Motor exam as listed above. Psychiatric: Judgment intact, Mood & affect appropriate for pt's clinical situation. Dermatologic: No rashes or ulcers noted.  No changes consistent with cellulitis. Lymph : No Cervical lymphadenopathy, no lichenification or skin changes of chronic lymphedema.  CBC Lab Results  Component Value Date   WBC 4.5 08/08/2016   HGB 11.4 (L) 08/08/2016   HCT 35.6 (L) 08/08/2016   MCV 97.5 08/08/2016   PLT 235 08/08/2016    BMET    Component Value Date/Time   NA 140 08/08/2016 0932   NA 137 04/09/2016 1008   NA 137 04/26/2012 1145   K 4.9 08/08/2016 0932   K 4.0 04/26/2012 1145   CL 105 08/08/2016 0932   CL 104 04/26/2012 1145   CO2 26 08/08/2016 0932   CO2 29 04/26/2012 1145   GLUCOSE 111 (H) 08/08/2016 0932   GLUCOSE 105 (H) 04/26/2012 1145   BUN 19 08/08/2016 0932   BUN 19 04/09/2016  1008   BUN 16 04/26/2012 1145   CREATININE 1.51 (H) 08/08/2016 0932   CALCIUM 9.2 08/08/2016 0932   CALCIUM 8.9 04/26/2012 1145   GFRNONAA 41 (L) 08/08/2016 0932   GFRAA 47 (L) 08/08/2016 0932   CrCl cannot be calculated (Patient's most recent lab result is older than the maximum 21 days allowed.).  COAG No results found for: INR, PROTIME  Radiology No results found.  Assessment/Plan 1. Bilateral carotid artery disease (HCC) Recommend:  Given the patient's asymptomatic subcritical stenosis no further invasive testing or surgery at this time.  Duplex ultrasound shows moderate stenosis bilaterally.  Continue antiplatelet therapy as prescribed Continue management of CAD, HTN and Hyperlipidemia Healthy heart diet,  encouraged exercise at least 4 times per week Follow up in 12  months with duplex ultrasound and physical exam based on 50% stenosis of the bilateral carotid artery      2. Essential hypertension Continue antihypertensive medications as already ordered and reviewed, no changes at this time.  3. Peripheral vascular disease ([place]) Recommend:  The patient has experienced increased symptoms and is now describing lifestyle limiting claudication and mild rest pain.   Given the severity of the patient's lower extremity symptoms the patient should undergo angiography and intervention.  Risk and benefits were reviewed the patient.  Indications for the procedure were reviewed.  All questions were answered, the patient agrees to proceed.   The patient should continue walking and begin a more formal exercise program.  The patient should continue antiplatelet therapy and aggressive treatment of the lipid abnormalities  The patient will follow up with me after the angiogram.   4. Atherosclerosis of native coronary artery of native heart without angina pectoris Continue cardiac medications as already ordered and reviewed, no changes at this time.  Continue statin as ordered  and reviewed, no changes at this time  Nitrates PRN for chest pain.    Hortencia Pilar, MD  09/24/2016 10:10 AM

## 2016-09-27 ENCOUNTER — Ambulatory Visit: Payer: 59 | Admitting: Cardiovascular Disease

## 2016-10-11 ENCOUNTER — Other Ambulatory Visit: Payer: Self-pay

## 2016-10-11 MED ORDER — ATORVASTATIN CALCIUM 40 MG PO TABS: 40.0000 mg | ORAL_TABLET | Freq: Every day | ORAL | 1 refills | Status: DC

## 2016-10-11 NOTE — Telephone Encounter (Signed)
Last SGPT and lipids reviewed Rx approved 

## 2016-10-11 NOTE — Telephone Encounter (Signed)
Patient needs 90 day with refills

## 2016-11-11 ENCOUNTER — Other Ambulatory Visit: Payer: Self-pay | Admitting: Family Medicine

## 2016-11-14 NOTE — Telephone Encounter (Signed)
rx approved

## 2016-11-23 ENCOUNTER — Other Ambulatory Visit (INDEPENDENT_AMBULATORY_CARE_PROVIDER_SITE_OTHER): Payer: Self-pay | Admitting: Vascular Surgery

## 2016-11-23 DIAGNOSIS — I739 Peripheral vascular disease, unspecified: Secondary | ICD-10-CM

## 2016-11-26 ENCOUNTER — Ambulatory Visit (INDEPENDENT_AMBULATORY_CARE_PROVIDER_SITE_OTHER): Payer: Medicare Other

## 2016-11-26 ENCOUNTER — Other Ambulatory Visit (INDEPENDENT_AMBULATORY_CARE_PROVIDER_SITE_OTHER): Payer: Self-pay | Admitting: Vascular Surgery

## 2016-11-26 ENCOUNTER — Ambulatory Visit (INDEPENDENT_AMBULATORY_CARE_PROVIDER_SITE_OTHER): Payer: Medicare Other | Admitting: Vascular Surgery

## 2016-11-26 ENCOUNTER — Encounter (INDEPENDENT_AMBULATORY_CARE_PROVIDER_SITE_OTHER): Payer: Self-pay | Admitting: Vascular Surgery

## 2016-11-26 VITALS — BP 131/58 | HR 70 | Resp 14 | Wt 166.0 lb

## 2016-11-26 DIAGNOSIS — I739 Peripheral vascular disease, unspecified: Secondary | ICD-10-CM | POA: Diagnosis not present

## 2016-11-26 DIAGNOSIS — I2510 Atherosclerotic heart disease of native coronary artery without angina pectoris: Secondary | ICD-10-CM

## 2016-11-26 DIAGNOSIS — I6523 Occlusion and stenosis of bilateral carotid arteries: Secondary | ICD-10-CM

## 2016-11-26 DIAGNOSIS — N184 Chronic kidney disease, stage 4 (severe): Secondary | ICD-10-CM

## 2016-11-26 DIAGNOSIS — I10 Essential (primary) hypertension: Secondary | ICD-10-CM | POA: Diagnosis not present

## 2016-11-26 DIAGNOSIS — I779 Disorder of arteries and arterioles, unspecified: Secondary | ICD-10-CM

## 2016-11-26 NOTE — Progress Notes (Signed)
MRN : [id number]  Samuel Bryan. is a 83 y.o. (01-01-1927) male who presents with chief complaint of  Chief Complaint  Patient presents with  . Follow-up  .  History of Present Illness: The patient is seen for follow up evaluation of carotid stenosis. The carotid stenosis followed by ultrasound.   The patient denies amaurosis fugax. There is no recent history of TIA symptoms or focal motor deficits. There is no prior documented CVA.  The patient is taking enteric-coated aspirin 81 mg daily.  There is no history of migraine headaches. There is no history of seizures.  He is also followed for his PAD.  Today he denies leg pain or claudication symptoms.  No new ulcers or open wounds  The patient has a history of coronary artery disease, no recent episodes of angina or shortness of breath. There is a history of hyperlipidemia which is being treated with a statin.    Carotid Duplex done today shows 32-35% LICA.  Slightly worse when compared to last study.  Current Meds  Medication Sig  . amLODipine (NORVASC) 5 MG tablet Take 1 tablet (5 mg total) by mouth daily.  Marland Kitchen aspirin EC 81 MG tablet Take by mouth.  Marland Kitchen atorvastatin (LIPITOR) 40 MG tablet Take 1 tablet (40 mg total) by mouth at bedtime.  . brimonidine (ALPHAGAN) 0.2 % ophthalmic solution 1 drop Three (3) times a day.  . citalopram (CELEXA) 10 MG tablet Take 1 tablet (10 mg total) by mouth daily.  . fluticasone (FLONASE) 50 MCG/ACT nasal spray Place 2 sprays into both nostrils daily. (Patient taking differently: Place 2 sprays into both nostrils as needed. )  . furosemide (LASIX) 40 MG tablet Take 40 mg by mouth daily.  Marland Kitchen losartan (COZAAR) 100 MG tablet Take 100 mg by mouth daily.  . Omega-3 Fatty Acids (FISH OIL) 1000 MG CAPS Take 2 capsules by mouth daily.    . timolol (BETIMOL) 0.5 % ophthalmic solution Place 1 drop into both eyes 2 (two) times daily.    Past Medical History:  Diagnosis Date  . Atherosclerosis of renal  artery ([place])   . Essential hypertension, benign   . Occlusion and stenosis of carotid artery without mention of cerebral infarction    moderate left ICA stenosis  . Peripheral vascular disease, unspecified    mild lifestyle limiting claudication  . Pure hypercholesterolemia   . Type 2 diabetes mellitus ([place])     Past Surgical History:  Procedure Laterality Date  . APPENDECTOMY  1970's  . COLONOSCOPY    . coronary artery stenting  1999   at [facility]   . renal angiography      Social History Social History  Substance Use Topics  . Smoking status: Former Smoker    Types: Cigarettes    Quit date: 10/29/1970  . Smokeless tobacco: Never Used  . Alcohol use No    Family History Family History  Problem Relation Age of Onset  . Diabetes Mother   . Hypertension Brother   . Hyperlipidemia Brother   . Diabetes Brother   . Hypertension Son   No family history of bleeding/clotting disorders, porphyria or autoimmune disease   No Known Allergies   REVIEW OF SYSTEMS (Negative unless checked)  Constitutional: [] Weight loss  [] Fever  [] Chills Cardiac: [] Chest pain   [] Chest pressure   [] Palpitations   [] Shortness of breath when laying flat   [x] Shortness of breath with exertion. Vascular:  [] Pain in legs with walking   [] Pain in  legs at rest  [] History of DVT   [] Phlebitis   [] Swelling in legs   [] Varicose veins   [] Non-healing ulcers Pulmonary:   [] Uses home oxygen   [] Productive cough   [] Hemoptysis   [] Wheeze  [] COPD   [] Asthma Neurologic:  [] Dizziness   [] Seizures   [] History of stroke   [] History of TIA  [] Aphasia   [] Vissual changes   [] Weakness or numbness in arm   [] Weakness or numbness in leg Musculoskeletal:   [] Joint swelling   [x] Joint pain   [] Low back pain Hematologic:  [] Easy bruising  [] Easy bleeding   [] Hypercoagulable state   [] Anemic Gastrointestinal:  [] Diarrhea   [] Vomiting  [] Gastroesophageal reflux/heartburn   [] Difficulty swallowing. Genitourinary:  [x] Chronic  kidney disease   [] Difficult urination  [] Frequent urination   [] Blood in urine Skin:  [] Rashes   [] Ulcers  Psychological:  [] History of anxiety   []  History of major depression.  Physical Examination  Vitals:   11/26/16 1423  BP: (!) 131/58  Pulse: 70  Resp: 14  Weight: 166 lb (75.3 kg)   Body mass index is 26.79 kg/m. Gen: WD/WN, NAD Head: [place]/AT, No temporalis wasting.  Ear/Nose/Throat: Hearing grossly intact, nares w/o erythema or drainage, poor dentition Eyes: PER, EOMI, sclera nonicteric.  Neck: Supple, no masses.  No bruit or JVD.  Pulmonary:  Good air movement, clear to auscultation bilaterally, no use of accessory muscles.  Cardiac: RRR, normal S1, S2, no Murmurs. Vascular:  Bilateral carotid bruits.  Feet cool to the touch and there is 3 second capillary refill. No wounds Vessel Right Left  Radial Palpable Palpable  Ulnar Palpable Palpable  Brachial Palpable Palpable  Carotid Palpable Palpable  Femoral Palpable Palpable  Popliteal Not Palpable Not Palpable  PT Not Palpable Not Palpable  DP Not Palpable Not Palpable  Gastrointestinal: soft, non-distended. No guarding/no peritoneal signs.  Musculoskeletal: M/S 5/5 throughout.  No deformity or atrophy.  Neurologic: CN 2-12 intact. Pain and light touch intact in extremities.  Symmetrical.  Speech is fluent. Motor exam as listed above. Psychiatric: Judgment intact, Mood & affect appropriate for pt's clinical situation. Dermatologic: No rashes or ulcers noted.  No changes consistent with cellulitis. Lymph : No Cervical lymphadenopathy, no lichenification or skin changes of chronic lymphedema.  CBC Lab Results  Component Value Date   WBC 4.5 08/08/2016   HGB 11.4 (L) 08/08/2016   HCT 35.6 (L) 08/08/2016   MCV 97.5 08/08/2016   PLT 235 08/08/2016    BMET    Component Value Date/Time   NA 140 08/08/2016 0932   NA 137 04/09/2016 1008   NA 137 04/26/2012 1145   K 4.9 08/08/2016 0932   K 4.0 04/26/2012 1145   CL  105 08/08/2016 0932   CL 104 04/26/2012 1145   CO2 26 08/08/2016 0932   CO2 29 04/26/2012 1145   GLUCOSE 111 (H) 08/08/2016 0932   GLUCOSE 105 (H) 04/26/2012 1145   BUN 19 08/08/2016 0932   BUN 19 04/09/2016 1008   BUN 16 04/26/2012 1145   CREATININE 1.51 (H) 08/08/2016 0932   CALCIUM 9.2 08/08/2016 0932   CALCIUM 8.9 04/26/2012 1145   GFRNONAA 41 (L) 08/08/2016 0932   GFRAA 47 (L) 08/08/2016 0932   CrCl cannot be calculated (Patient's most recent lab result is older than the maximum 21 days allowed.).  COAG No results found for: INR, PROTIME  Radiology No results found.  Assessment/Plan 1. Bilateral carotid artery disease (HCC) Recommend:  Given the patient's asymptomatic subcritical stenosis  no further invasive testing or surgery at this time.  Duplex ultrasound shows >70% stenosis bilaterally.  Continue antiplatelet therapy as prescribed Continue management of CAD, HTN and Hyperlipidemia Healthy heart diet,  encouraged exercise at least 4 times per week Follow up in 6 months with duplex ultrasound and physical exam based on >50% stenosis of the bilateral carotid arteries   - VAS US CAROTID; Future  2. Peripheral vascular disease ([place])  Recommend:  The patient has evidence of atherosclerosis of the lower extremities with claudication.  The patient does not voice lifestyle limiting changes at this point in time.  Noninvasive studies do not suggest clinically significant change.  No invasive studies, angiography or surgery at this time The patient should continue walking and begin a more formal exercise program.  The patient should continue antiplatelet therapy and aggressive treatment of the lipid abnormalities  No changes in the patient's medications at this time  The patient should continue wearing graduated compression socks 10-15 mmHg strength to control the mild edema.   The patient is strongly urged to start seeing a Podiatrist for his routine foot care.   He voices understanding.  - VAS [place] ABI WITH/WO TBI; Future  3. Atherosclerosis of native coronary artery of native heart without angina pectoris Continue cardiac and antihypertensive medications as already ordered and reviewed, no changes at this time.  Continue statin as ordered and reviewed, no changes at this time  Nitrates PRN for chest pain  4. Essential hypertension Continue antihypertensive medications as already ordered, these medications have been reviewed and there are no changes at this time.  5. Chronic kidney disease, stage IV (severe) (HCC) Continue antihypertensive medications as already ordered, these medications have been reviewed and there are no changes at this time.  Avoid nephrotoxic drugs.  Maintain good hydration     Hortencia Pilar, MD  11/26/2016 4:13 PM

## 2016-11-30 ENCOUNTER — Encounter (INDEPENDENT_AMBULATORY_CARE_PROVIDER_SITE_OTHER): Payer: Medicare Other

## 2016-12-04 ENCOUNTER — Ambulatory Visit (INDEPENDENT_AMBULATORY_CARE_PROVIDER_SITE_OTHER): Payer: Medicare Other | Admitting: Family Medicine

## 2016-12-04 ENCOUNTER — Encounter: Payer: Self-pay | Admitting: Family Medicine

## 2016-12-04 VITALS — BP 134/60 | HR 81 | Temp 98.0°F | Ht 66.0 in | Wt 164.8 lb

## 2016-12-04 DIAGNOSIS — R6889 Other general symptoms and signs: Secondary | ICD-10-CM | POA: Diagnosis not present

## 2016-12-04 DIAGNOSIS — Z0000 Encounter for general adult medical examination without abnormal findings: Secondary | ICD-10-CM

## 2016-12-04 DIAGNOSIS — R413 Other amnesia: Secondary | ICD-10-CM | POA: Diagnosis not present

## 2016-12-04 NOTE — Progress Notes (Signed)
Patient: Samuel Bryan., Male    DOB: 12-31-1926, 83 y.o.   MRN: [id number]  Visit Date: 12/11/2016  Today's Provider: Enid Derry, MD   Chief Complaint  Patient presents with  . Annual Exam    Subjective:   Samuel Bryan. is a 83 y.o. male who presents today for his Subsequent Annual Wellness Visit.  Caregiver input:  Son; he shouldn't be cutting his own toenails  Patient is here with his son for a medicare wellness visit.  He is doing well overall. The only concern is about his toenails.    Review of Systems  Constitutional: Negative for unexpected weight change.  HENT: Positive for hearing loss.     Past Medical History:  Diagnosis Date  . Atherosclerosis of renal artery ([place])   . Essential hypertension, benign   . Occlusion and stenosis of carotid artery without mention of cerebral infarction    moderate left ICA stenosis  . Peripheral vascular disease, unspecified    mild lifestyle limiting claudication  . Pure hypercholesterolemia   . Type 2 diabetes mellitus ([place])    Past Surgical History:  Procedure Laterality Date  . APPENDECTOMY  1970's  . COLONOSCOPY    . coronary artery stenting  1999   at [facility]   . FACIAL LACERATION REPAIR Left 12/13/2015   performed at [facility] by Dr. Marcial Pacas  . renal angiography     Family History  Problem Relation Age of Onset  . Diabetes Mother   . Hypertension Brother   . Hyperlipidemia Brother   . Diabetes Brother   . Hypertension Son   . Hypertension Son   . Birth defects Neg Hx   . Stroke Neg Hx    Social History   Social History  . Marital status: Widowed    Spouse name: N/A  . Number of children: N/A  . Years of education: N/A   Occupational History  . Not on file.   Social History Main Topics  . Smoking status: Former Smoker    Types: Cigarettes    Quit date: 10/29/1970  . Smokeless tobacco: Never Used  . Alcohol use No  . Drug use: No  . Sexual activity: Not Currently   Other Topics Concern  .  Not on file   Social History Narrative   Married, all children are healthy.    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     Outpatient Encounter Prescriptions as of 12/04/2016  Medication Sig Note  . aspirin EC 81 MG tablet Take by mouth. 05/09/2015: Received from: [facility]  . atorvastatin (LIPITOR) 40 MG tablet Take 1 tablet (40 mg total) by mouth at bedtime.   . brimonidine (ALPHAGAN) 0.2 % ophthalmic solution 1 drop Three (3) times a day.   . citalopram (CELEXA) 10 MG tablet Take 1 tablet (10 mg total) by mouth daily.   . fluticasone (FLONASE) 50 MCG/ACT nasal spray Place 2 sprays into both nostrils daily. (Patient taking differently: Place 2 sprays into both nostrils as needed. )   . furosemide (LASIX) 40 MG tablet Take 40 mg by mouth daily. 08/08/2016: Received from: External Pharmacy  . losartan (COZAAR) 100 MG tablet Take 100 mg by mouth daily. 08/08/2016: Received from: External Pharmacy  . Omega-3 Fatty Acids (FISH OIL) 1000 MG CAPS Take 2 capsules by mouth daily.     . timolol (BETIMOL) 0.5 % ophthalmic solution Place 1 drop into both eyes 2 (two)  times daily.   . [DISCONTINUED] amLODipine (NORVASC) 5 MG tablet Take 1 tablet (5 mg total) by mouth daily.    No facility-administered encounter medications on file as of 12/04/2016.     Functional Ability / Safety Screening 1.  Was the timed Get Up and Go test longer than 30 seconds?  no 2.  Does the patient need help with the phone, transportation, shopping,      preparing meals, housework, laundry, medications, or managing money?  yes 3.  Does the patient's home have:  loose throw rugs in the hallway?   no      Grab bars in the bathroom? no      Handrails on the stairs?   yes      Poor lighting?   no 4.  Has the patient noticed any hearing difficulties?   yes  Fall Risk Assessment See under rooming  Depression Screen See under rooming Depression screen [facility]  2/9 12/04/2016 08/08/2016 04/03/2016 12/20/2015 11/11/2015  Decreased Interest 0 0 0 0 0  Down, Depressed, Hopeless 0 0 1 0 0  PHQ - 2 Score 0 0 1 0 0    Advanced Directives Does patient have a HCPOA?    yes If yes, name and contact information: Samuel Bryan, [phone], [phone] Does patient have a living will or MOST form?  yes , full code  Objective:   Vitals: BP 134/60   Pulse 81   Temp 98 F (36.7 C) (Oral)   Ht 5\' 6"  (1.676 m)   Wt 164 lb 12.8 oz (74.8 kg)   SpO2 94%   BMI 26.60 kg/m  Body mass index is 26.6 kg/m. No exam data present  Physical Exam  Constitutional: He appears well-developed and well-nourished.  Cardiovascular: Normal rate.   Pulmonary/Chest: Effort normal.  Psychiatric: He has a normal mood and affect. His mood appears not anxious. He does not exhibit a depressed mood.   Mood/affect:  euthymic Appearance:  Casually dressed  Cognitive Testing - 6-CIT  Correct? Score   What year is it? yes 0 Yes = 0    No = 4  What month is it? yes 0 Yes = 0    No = 3  Remember:     Pia Mau, [address], [place]     What time is it? no 0 Yes = 0    No = 3  Count backwards from 20 to 1 no 4 Correct = 0    1 error = 2   More than 1 error = 4  Say the months of the year in reverse. no 4 Correct = 0    1 error = 2   More than 1 error = 4  What address did I ask you to remember? no 10 Correct = 0  1 error = 2    2 error = 4    3 error = 6    4 error = 8    All wrong = 10       TOTAL SCORE  18/28   Interpretation:  Abnormal- offered referral  Normal (0-7) Abnormal (8-28)    Assessment & Plan:     Annual Wellness Visit  Reviewed patient's Family Medical History Reviewed and updated list of patient's medical providers Assessment of cognitive impairment was done; offered neuro referral, pt to consider Assessed patient's functional ability Established a written schedule for health screening [place] Completed and  Reviewed  Exercise Activities and Dietary recommendations  Goals    None    good eater, active  Immunization History  Administered Date(s) Administered  . Influenza, High Dose Seasonal PF 09/13/2015, 08/08/2016  . Influenza,inj,Quad PF,36+ Mos 07/08/2014  . Influenza-Unspecified 08/04/2007, 08/10/2008, 08/06/2012, 06/19/2013  . Pneumococcal Conjugate-13 04/20/2014  . Pneumococcal Polysaccharide-23 09/13/2015  . Tdap 06/27/2011  . Zoster 07/12/2008    Health Maintenance  Topic Date Due  . HEMOGLOBIN A1C  10/09/2016  . OPHTHALMOLOGY EXAM  10/29/2016  . FOOT EXAM  04/03/2017  . TETANUS/TDAP  06/26/2021  . INFLUENZA VACCINE  Completed  . ZOSTAVAX  Completed  . PNA vac Low Risk Adult  Completed   Discussed health benefits of physical activity, and encouraged him to engage in regular exercise appropriate for his age and condition.   No orders of the defined types were placed in this encounter.   Current Outpatient Prescriptions:  .  aspirin EC 81 MG tablet, Take by mouth., Disp: , Rfl:  .  atorvastatin (LIPITOR) 40 MG tablet, Take 1 tablet (40 mg total) by mouth at bedtime., Disp: 90 tablet, Rfl: 1 .  brimonidine (ALPHAGAN) 0.2 % ophthalmic solution, 1 drop Three (3) times a day., Disp: 5 mL, Rfl: 2 .  citalopram (CELEXA) 10 MG tablet, Take 1 tablet (10 mg total) by mouth daily., Disp: 90 tablet, Rfl: 1 .  fluticasone (FLONASE) 50 MCG/ACT nasal spray, Place 2 sprays into both nostrils daily. (Patient taking differently: Place 2 sprays into both nostrils as needed. ), Disp: 16 g, Rfl: 6 .  furosemide (LASIX) 40 MG tablet, Take 40 mg by mouth daily., Disp: , Rfl:  .  losartan (COZAAR) 100 MG tablet, Take 100 mg by mouth daily., Disp: , Rfl:  .  Omega-3 Fatty Acids (FISH OIL) 1000 MG CAPS, Take 2 capsules by mouth daily.  , Disp: , Rfl:  .  timolol (BETIMOL) 0.5 % ophthalmic solution, Place 1 drop into both eyes 2 (two) times daily., Disp: 10 mL, Rfl: 12 .  amLODipine (NORVASC) 5 MG  tablet, Take 1 tablet (5 mg total) by mouth daily., Disp: 90 tablet, Rfl: 1 There are no discontinued medications.  Next Medicare Wellness Visit in 12+ months

## 2016-12-04 NOTE — Patient Instructions (Addendum)
Health Maintenance  Topic Date Due  . HEMOGLOBIN A1C  10/09/2016  . OPHTHALMOLOGY EXAM  10/29/2016  . FOOT EXAM  04/03/2017  . TETANUS/TDAP  06/26/2021  . INFLUENZA VACCINE  Completed  . ZOSTAVAX  Completed  . PNA vac Low Risk Adult  Completed    Try vitamin C (orange juice if not diabetic or vitamin C tablets) and drink green tea to help your immune system during your illness Get plenty of rest and hydration Try nasal saline rinse, and salt water gargles for symptoms Call me if not getting better, or if you get worse  Health Maintenance, Male A healthy lifestyle and preventative care can promote health and wellness.  Maintain regular health, dental, and eye exams.  Eat a healthy diet. Foods like vegetables, fruits, whole grains, low-fat dairy products, and lean protein foods contain the nutrients you need and are low in calories. Decrease your intake of foods high in solid fats, added sugars, and salt. Get information about a proper diet from your health care provider, if necessary.  Regular physical exercise is one of the most important things you can do for your health. Most adults should get at least 150 minutes of moderate-intensity exercise (any activity that increases your heart rate and causes you to sweat) each week. In addition, most adults need muscle-strengthening exercises on 2 or more days a week.   Maintain a healthy weight. The body mass index (BMI) is a screening tool to identify possible weight problems. It provides an estimate of body fat based on height and weight. Your health care provider can find your BMI and can help you achieve or maintain a healthy weight. For males 20 years and older:  A BMI below 18.5 is considered underweight.  A BMI of 18.5 to 24.9 is normal.  A BMI of 25 to 29.9 is considered overweight.  A BMI of 30 and above is considered obese.  Maintain normal blood lipids and cholesterol by exercising and minimizing your intake of saturated fat.  Eat a balanced diet with plenty of fruits and vegetables. Blood tests for lipids and cholesterol should begin at age 74 and be repeated every 5 years. If your lipid or cholesterol levels are high, you are over age 28, or you are at high risk for heart disease, you may need your cholesterol levels checked more frequently.Ongoing high lipid and cholesterol levels should be treated with medicines if diet and exercise are not working.  If you smoke, find out from your health care provider how to quit. If you do not use tobacco, do not start.  Lung cancer screening is recommended for adults aged 92-80 years who are at high risk for developing lung cancer because of a history of smoking. A yearly low-dose CT scan of the lungs is recommended for people who have at least a 30-pack-year history of smoking and are current smokers or have quit within the past 15 years. A pack year of smoking is smoking an average of 1 pack of cigarettes a day for 1 year (for example, a 30-pack-year history of smoking could mean smoking 1 pack a day for 30 years or 2 packs a day for 15 years). Yearly screening should continue until the smoker has stopped smoking for at least 15 years. Yearly screening should be stopped for people who develop a health problem that would prevent them from having lung cancer treatment.  If you choose to drink alcohol, do not have more than 2 drinks per day. One  drink is considered to be 12 oz (360 mL) of beer, 5 oz (150 mL) of wine, or 1.5 oz (45 mL) of liquor.  Avoid the use of street drugs. Do not share needles with anyone. Ask for help if you need support or instructions about stopping the use of drugs.  High blood pressure causes heart disease and increases the risk of stroke. High blood pressure is more likely to develop in:  People who have blood pressure in the end of the normal range (100-139/85-89 mm Hg).  People who are overweight or obese.  People who are African American.  If you are  68-94 years of age, have your blood pressure checked every 3-5 years. If you are 71 years of age or older, have your blood pressure checked every year. You should have your blood pressure measured twice-once when you are at a hospital or clinic, and once when you are not at a hospital or clinic. Record the average of the two measurements. To check your blood pressure when you are not at a hospital or clinic, you can use:  An automated blood pressure machine at a pharmacy.  A home blood pressure monitor.  If you are 6-41 years old, ask your health care provider if you should take aspirin to prevent heart disease.  Diabetes screening involves taking a blood sample to check your fasting blood sugar level. This should be done once every 3 years after age 22 if you are at a normal weight and without risk factors for diabetes. Testing should be considered at a younger age or be carried out more frequently if you are overweight and have at least 1 risk factor for diabetes.  Colorectal cancer can be detected and often prevented. Most routine colorectal cancer screening begins at the age of 50 and continues through age 49. However, your health care provider may recommend screening at an earlier age if you have risk factors for colon cancer. On a yearly basis, your health care provider may provide home test kits to check for hidden blood in the stool. A small camera at the end of a tube may be used to directly examine the colon (sigmoidoscopy or colonoscopy) to detect the earliest forms of colorectal cancer. Talk to your health care provider about this at age 42 when routine screening begins. A direct exam of the colon should be repeated every 5-10 years through age 44, unless early forms of precancerous polyps or small growths are found.  People who are at an increased risk for hepatitis B should be screened for this virus. You are considered at high risk for hepatitis B if:  You were born in a country where  hepatitis B occurs often. Talk with your health care provider about which countries are considered high risk.  Your parents were born in a high-risk country and you have not received a shot to protect against hepatitis B (hepatitis B vaccine).  You have HIV or AIDS.  You use needles to inject street drugs.  You live with, or have sex with, someone who has hepatitis B.  You are a man who has sex with other men (MSM).  You get hemodialysis treatment.  You take certain medicines for conditions like cancer, organ transplantation, and autoimmune conditions.  Hepatitis C blood testing is recommended for all people born from [birth year] through 1963 and any individual with known risk factors for hepatitis C.  Healthy men should no longer receive prostate-specific antigen (PSA) blood tests as part of routine  cancer screening. Talk to your health care provider about prostate cancer screening.  Testicular cancer screening is not recommended for adolescents or adult males who have no symptoms. Screening includes self-exam, a health care provider exam, and other screening tests. Consult with your health care provider about any symptoms you have or any concerns you have about testicular cancer.  Practice safe sex. Use condoms and avoid high-risk sexual practices to reduce the spread of sexually transmitted infections (STIs).  You should be screened for STIs, including gonorrhea and chlamydia if:  You are sexually active and are younger than 24 years.  You are older than 24 years, and your health care provider tells you that you are at risk for this type of infection.  Your sexual activity has changed since you were last screened, and you are at an increased risk for chlamydia or gonorrhea. Ask your health care provider if you are at risk.  If you are at risk of being infected with HIV, it is recommended that you take a prescription medicine daily to prevent HIV infection. This is called pre-exposure  prophylaxis (PrEP). You are considered at risk if:  You are a man who has sex with other men (MSM).  You are a heterosexual man who is sexually active with multiple partners.  You take drugs by injection.  You are sexually active with a partner who has HIV.  Talk with your health care provider about whether you are at high risk of being infected with HIV. If you choose to begin PrEP, you should first be tested for HIV. You should then be tested every 3 months for as long as you are taking PrEP.  Use sunscreen. Apply sunscreen liberally and repeatedly throughout the day. You should seek shade when your shadow is shorter than you. Protect yourself by wearing long sleeves, pants, a wide-brimmed hat, and sunglasses year round whenever you are outdoors.  Tell your health care provider of new moles or changes in moles, especially if there is a change in shape or color. Also, tell your health care provider if a mole is larger than the size of a pencil eraser.  A one-time screening for abdominal aortic aneurysm (AAA) and surgical repair of large AAAs by ultrasound is recommended for men aged 68-75 years who are current or former smokers.  Stay current with your vaccines (immunizations). This information is not intended to replace advice given to you by your health care provider. Make sure you discuss any questions you have with your health care provider. Document Released: 04/12/2008 Document Revised: 11/05/2014 Document Reviewed: 07/19/2015 Elsevier Interactive Patient Education  2017 [employer].

## 2016-12-10 ENCOUNTER — Telehealth: Payer: Self-pay | Admitting: Family Medicine

## 2016-12-10 MED ORDER — AMLODIPINE BESYLATE 5 MG PO TABS: 5.0000 mg | ORAL_TABLET | Freq: Every day | ORAL | 1 refills | Status: DC

## 2016-12-10 NOTE — Telephone Encounter (Signed)
LMOM to inform pt °

## 2016-12-10 NOTE — Telephone Encounter (Signed)
Of course; I'll be happy to refill the amlodipine ---------------------- Rx for amlodipine sent Thank you In fact, I am okay with him postponing his next appointment until on or just after April 12th; we can see him then and get fasting labs If he'd like to cancel the appt for tomorrow, that's fine with me unless he needs to see me

## 2016-12-10 NOTE — Telephone Encounter (Signed)
Pts daughter wants to know if her dad can get his refills on his medications since he just had his CPE. I explained to her that he may need to keep his appt for tomorrow for his 4 mth fu. Please advise.

## 2016-12-11 ENCOUNTER — Ambulatory Visit: Payer: Medicare Other | Admitting: Family Medicine

## 2016-12-11 DIAGNOSIS — Z0000 Encounter for general adult medical examination without abnormal findings: Secondary | ICD-10-CM | POA: Insufficient documentation

## 2016-12-11 NOTE — Assessment & Plan Note (Signed)
USPSTF grade A and B recommendations reviewed with patient; age-appropriate recommendations, preventive care, screening tests, etc discussed and encouraged; healthy living encouraged; see AVS for patient education given to patient  

## 2017-01-16 ENCOUNTER — Ambulatory Visit (INDEPENDENT_AMBULATORY_CARE_PROVIDER_SITE_OTHER): Payer: Medicare Other | Admitting: Family Medicine

## 2017-01-16 ENCOUNTER — Encounter: Payer: Self-pay | Admitting: Family Medicine

## 2017-01-16 VITALS — BP 150/72 | HR 89 | Temp 97.8°F | Resp 14 | Wt 166.0 lb

## 2017-01-16 DIAGNOSIS — R197 Diarrhea, unspecified: Secondary | ICD-10-CM

## 2017-01-16 DIAGNOSIS — A09 Infectious gastroenteritis and colitis, unspecified: Secondary | ICD-10-CM | POA: Diagnosis not present

## 2017-01-16 MED ORDER — RANITIDINE HCL 150 MG PO TABS: 150.0000 mg | ORAL_TABLET | Freq: Two times a day (BID) | ORAL | 1 refills | Status: DC

## 2017-01-16 NOTE — Patient Instructions (Signed)
Have a yogurt daily for the next several days Use the acid reducer if needed Watch for recurrence of symptoms and call or go to the ER if vomiting, abdominal pain, etc

## 2017-01-16 NOTE — Progress Notes (Signed)
   BP (!) 150/72   Pulse 89   Temp 97.8 F (36.6 C) (Oral)   Resp 14   Wt 166 lb (75.3 kg)   SpO2 95%   BMI 26.79 kg/m    Subjective:    Patient ID: Samuel Lope., male    DOB: 1927/04/28, 83 y.o.   MRN: [id number]  HPI: Samuel Bryan. is a 83 y.o. male  Chief Complaint  Patient presents with  . Diarrhea   Patient is here with his son c/o diarrhea No travel Sick contacts with people, not around any sick animals No fever No blood in the stool Belly pain is gone now; was present when they made the appointment Appetite is not too good, coming back now He thinks he is getting better, but already had the appt and thought he'd go ahead and get checked out  Depression screen [facility] 2/9 02/06/2017 01/16/2017 12/04/2016 08/08/2016 04/03/2016  Decreased Interest 0 0 0 0 0  Down, Depressed, Hopeless 0 0 0 0 1  PHQ - 2 Score 0 0 0 0 1   Relevant past medical, surgical, family and social history reviewed Past Medical History:  Diagnosis Date  . Atherosclerosis of renal artery ([place])   . Essential hypertension, benign   . Occlusion and stenosis of carotid artery without mention of cerebral infarction    moderate left ICA stenosis  . Peripheral vascular disease, unspecified    mild lifestyle limiting claudication  . Pure hypercholesterolemia   . Type 2 diabetes mellitus ([place])    Past Surgical History:  Procedure Laterality Date  . APPENDECTOMY  1970's  . COLONOSCOPY    . coronary artery stenting  1999   at [facility]   . FACIAL LACERATION REPAIR Left 12/13/2015   performed at [facility] by Dr. Marcial Pacas  . renal angiography     Social History  Substance Use Topics  . Smoking status: Former Smoker    Types: Cigarettes    Quit date: 10/29/1970  . Smokeless tobacco: Never Used  . Alcohol use No   Interim medical history since last visit reviewed. Allergies and medications reviewed  Review of Systems Per HPI unless specifically indicated above     Objective:    BP (!) 150/72    Pulse 89   Temp 97.8 F (36.6 C) (Oral)   Resp 14   Wt 166 lb (75.3 kg)   SpO2 95%   BMI 26.79 kg/m    Physical Exam  Constitutional: He appears well-developed and well-nourished. No distress.  Cardiovascular: Normal rate and regular rhythm.   Pulmonary/Chest: Effort normal and breath sounds normal.  Abdominal: Soft. Bowel sounds are normal. He exhibits no distension and no mass. There is no tenderness.  Neurological: He is alert.  Skin: He is not diaphoretic. No pallor.      Assessment & Plan:   Problem List Items Addressed This Visit    None    Visit Diagnoses    Diarrhea of presumed infectious origin    -  Primary   sounds to be resolving; most likely a viral gastroenteritis; nothing suggests SBO on exam; hydration, PO intake increased as tolerated; call if needed       Follow up plan: No Follow-up on file.  An after-visit summary was printed and given to the patient at [place].  Please see the patient instructions which may contain other information and recommendations beyond what is mentioned above in the assessment and plan.

## 2017-01-30 NOTE — Telephone Encounter (Signed)
COMPLETED

## 2017-02-06 ENCOUNTER — Encounter: Payer: Self-pay | Admitting: Family Medicine

## 2017-02-06 ENCOUNTER — Ambulatory Visit (INDEPENDENT_AMBULATORY_CARE_PROVIDER_SITE_OTHER): Payer: Medicare Other | Admitting: Family Medicine

## 2017-02-06 DIAGNOSIS — N184 Chronic kidney disease, stage 4 (severe): Secondary | ICD-10-CM | POA: Diagnosis not present

## 2017-02-06 DIAGNOSIS — I779 Disorder of arteries and arterioles, unspecified: Secondary | ICD-10-CM | POA: Diagnosis not present

## 2017-02-06 DIAGNOSIS — I739 Peripheral vascular disease, unspecified: Secondary | ICD-10-CM | POA: Diagnosis not present

## 2017-02-06 DIAGNOSIS — E782 Mixed hyperlipidemia: Secondary | ICD-10-CM

## 2017-02-06 DIAGNOSIS — R7301 Impaired fasting glucose: Secondary | ICD-10-CM

## 2017-02-06 DIAGNOSIS — Z5181 Encounter for therapeutic drug level monitoring: Secondary | ICD-10-CM

## 2017-02-06 NOTE — Assessment & Plan Note (Signed)
Check lipids tomorrow (fasting)

## 2017-02-06 NOTE — Assessment & Plan Note (Signed)
Managed by vascular doctor; asymptomatic, no claudication

## 2017-02-06 NOTE — Assessment & Plan Note (Signed)
Monitor kidney; followed by nephrologist, avoid NSAIDs

## 2017-02-06 NOTE — Assessment & Plan Note (Signed)
Check glucose and A1c tomorrow, avoid white bread and avoid sugary drinks

## 2017-02-06 NOTE — Assessment & Plan Note (Signed)
Check labs 

## 2017-02-06 NOTE — Progress Notes (Signed)
BP 136/78   Pulse 68   Temp 97.7 F (36.5 C) (Oral)   Resp 14   Wt 165 lb 1.6 oz (74.9 kg)   SpO2 99%   BMI 26.65 kg/m    Subjective:    Patient ID: Samuel Bryan., male    DOB: 1927/08/27, 83 y.o.   MRN: [id number]  HPI: Samuel Bryan. is a 83 y.o. male  Chief Complaint  Patient presents with  . Follow-up   Patient is here for f/u with his daughter High cholesterol; on statin Lab Results  Component Value Date   CHOL 141 08/08/2016   CHOL 128 04/09/2016   CHOL 123 05/09/2015   Lab Results  Component Value Date   HDL 60 08/08/2016   HDL 43 04/09/2016   HDL 38 (L) 05/09/2015   Lab Results  Component Value Date   LDLCALC 72 08/08/2016   LDLCALC 76 04/09/2016   LDLCALC 76 05/09/2015   Lab Results  Component Value Date   TRIG 47 08/08/2016   TRIG 43 04/09/2016   TRIG 44 05/09/2015   Lab Results  Component Value Date   CHOLHDL 2.4 08/08/2016   CHOLHDL 3.2 05/09/2015   No results found for: LDLDIRECT Dietary recall: Not much sausage Two eggs every morning, just the white part Brown bread No bacon No cheese No hamburgers  Known coronary artery disease; followed by cardiologist; he does get chest pain once a month; sees Dr. Clayborn Bigness and he is aware that he gets chest pain says patient; taking aspirin; has upcoming appointment; Dr. Clayborn Bigness has warned him about risk of heart attack; no passive smoke exposure  Peripheral vascular disease and carotid artery disease; followed by vascular specialist; just had carotids evaluated a few months ago as well as ABI per chart; I cannot view results though, but I see the date of 11/26/16; he denies claudication; he denies cold, purplish toes/feet  High blood pressure; trying to limit processed meats; no voiced complaints about medicine  Prediabetes; his mother had diabetes; does not drink sugary drinks; eats wheat bread, no white bread  Depression screen [facility] 2/9 02/06/2017 01/16/2017 12/04/2016 08/08/2016 04/03/2016    Decreased Interest 0 0 0 0 0  Down, Depressed, Hopeless 0 0 0 0 1  PHQ - 2 Score 0 0 0 0 1    Relevant past medical, surgical, family and social history reviewed Past Medical History:  Diagnosis Date  . Atherosclerosis of renal artery ([place])   . Essential hypertension, benign   . Occlusion and stenosis of carotid artery without mention of cerebral infarction    moderate left ICA stenosis  . Peripheral vascular disease, unspecified    mild lifestyle limiting claudication  . Pure hypercholesterolemia   . Type 2 diabetes mellitus ([place])    Past Surgical History:  Procedure Laterality Date  . APPENDECTOMY  1970's  . COLONOSCOPY    . coronary artery stenting  1999   at [facility]   . FACIAL LACERATION REPAIR Left 12/13/2015   performed at [facility] by Dr. Marcial Pacas  . renal angiography     Family History  Problem Relation Age of Onset  . Diabetes Mother   . Hypertension Brother   . Hyperlipidemia Brother   . Diabetes Brother   . Hypertension Son   . Hypertension Son   . Birth defects Neg Hx   . Stroke Neg Hx    Social History  Substance Use Topics  . Smoking status: Former Smoker    Types: Cigarettes  Quit date: 10/29/1970  . Smokeless tobacco: Never Used  . Alcohol use No   Interim medical history since last visit reviewed. Allergies and medications reviewed  Review of Systems  Cardiovascular: Positive for chest pain.   Per HPI unless specifically indicated above     Objective:    BP 136/78   Pulse 68   Temp 97.7 F (36.5 C) (Oral)   Resp 14   Wt 165 lb 1.6 oz (74.9 kg)   SpO2 99%   BMI 26.65 kg/m   Wt Readings from Last 3 Encounters:  02/06/17 165 lb 1.6 oz (74.9 kg)  01/16/17 166 lb (75.3 kg)  12/04/16 164 lb 12.8 oz (74.8 kg)    Physical Exam  Constitutional: He appears well-developed and well-nourished. No distress.  HENT:  Head: Normocephalic and atraumatic.  Right Ear: Decreased hearing is noted.  Left Ear: Decreased hearing is noted.  Eyes: EOM  are normal. No scleral icterus.  Neck: No JVD present. No thyromegaly present.  Cardiovascular: Normal rate and regular rhythm.   Pulses:      Dorsalis pedis pulses are 1+ on the right side, and 1+ on the left side.  Feet are not cool to the touch  Pulmonary/Chest: Effort normal and breath sounds normal.  Abdominal: Soft. Bowel sounds are normal. He exhibits no distension. There is no tenderness. There is no guarding.  Musculoskeletal: He exhibits no edema.       Right knee: He exhibits no swelling and no effusion. No tenderness found.       Left knee: He exhibits no swelling and no effusion. No tenderness found.  Mild crepitus both knees, left greater than right  Neurological: Coordination normal.  Skin: Skin is warm and dry. No pallor.  Scar left cheek  Psychiatric: He has a normal mood and affect. His behavior is normal. Judgment and thought content normal. His mood appears not anxious. He does not exhibit a depressed mood.    Results for orders placed or performed in visit on 08/08/16  CBC with Differential/Platelet  Result Value Ref Range   WBC 4.5 3.8 - 10.8 K/uL   RBC 3.65 (L) 4.20 - 5.80 MIL/uL   Hemoglobin 11.4 (L) 13.2 - 17.1 g/dL   HCT 35.6 (L) 38.5 - 50.0 %   MCV 97.5 80.0 - 100.0 fL   MCH 31.2 27.0 - 33.0 pg   MCHC 32.0 32.0 - 36.0 g/dL   RDW 13.7 11.0 - 15.0 %   Platelets 235 140 - 400 K/uL   MPV 10.5 7.5 - 12.5 fL   Neutro Abs 2,430 1,500 - 7,800 cells/uL   Lymphs Abs 1,260 850 - 3,900 cells/uL   Monocytes Absolute 630 200 - 950 cells/uL   Eosinophils Absolute 180 15 - 500 cells/uL   Basophils Absolute 0 0 - 200 cells/uL   Neutrophils Relative % 54 %   Lymphocytes Relative 28 %   Monocytes Relative 14 %   Eosinophils Relative 4 %   Basophils Relative 0 %   Smear Review Criteria for review not met   COMPLETE METABOLIC PANEL WITH GFR  Result Value Ref Range   Sodium 140 135 - 146 mmol/L   Potassium 4.9 3.5 - 5.3 mmol/L   Chloride 105 98 - 110 mmol/L   CO2  26 20 - 31 mmol/L   Glucose, Bld 111 (H) 65 - 99 mg/dL   BUN 19 7 - 25 mg/dL   Creat 1.51 (H) 0.70 - 1.11 mg/dL   Total Bilirubin  0.6 0.2 - 1.2 mg/dL   Alkaline Phosphatase 68 40 - 115 U/L   AST 20 10 - 35 U/L   ALT 13 9 - 46 U/L   Total Protein 6.8 6.1 - 8.1 g/dL   Albumin 3.8 3.6 - 5.1 g/dL   Calcium 9.2 8.6 - 10.3 mg/dL   GFR, Est African American 47 (L) >=60 mL/min   GFR, Est Non African American 41 (L) >=60 mL/min  Lipid panel  Result Value Ref Range   Cholesterol 141 125 - 200 mg/dL   Triglycerides 47 <150 mg/dL   HDL 60 >=40 mg/dL   Total CHOL/HDL Ratio 2.4 <=5.0 Ratio   VLDL 9 <30 mg/dL   LDL Cholesterol 72 <130 mg/dL      Assessment & Plan:   Problem List Items Addressed This Visit      Cardiovascular and Mediastinum   Peripheral vascular disease ([place]) (Chronic)    Managed by vascular doctor; asymptomatic, no claudication      Relevant Orders   CBC with Differential/Platelet   Carotid arterial disease ([facility]) (Chronic)    Continue aspirin, statin, see vascular doctor      Relevant Orders   CBC with Differential/Platelet     Endocrine   Impaired fasting glucose (Chronic)    Check glucose and A1c tomorrow, avoid white bread and avoid sugary drinks      Relevant Orders   Hemoglobin A1c     Genitourinary   Chronic kidney disease, stage IV (severe) ([facility])    Monitor kidney; followed by nephrologist, avoid NSAIDs        Other   Medication monitoring encounter    Check labs      Relevant Orders   COMPLETE METABOLIC PANEL WITH GFR   Hyperlipidemia (Chronic)    Check lipids tomorrow (fasting)      Relevant Orders   Lipid panel       Follow up plan: Return in about 6 months (around 08/12/2017) for twenty minute follow-up with fasting labs.  An after-visit summary was printed and given to the patient at [place].  Please see the patient instructions which may contain other information and recommendations beyond what is mentioned above in the  assessment and plan.  No orders of the defined types were placed in this encounter.   Orders Placed This Encounter  Procedures  . CBC with Differential/Platelet  . COMPLETE METABOLIC PANEL WITH GFR  . Hemoglobin A1c  . Lipid panel

## 2017-02-06 NOTE — Assessment & Plan Note (Signed)
Continue aspirin, statin, see vascular doctor

## 2017-02-06 NOTE — Patient Instructions (Addendum)
Return tomorrow for FASTING labs; okay to have medicines with as much water as desired If you need something for aches or pains, try to use Tylenol (acetaminophen) instead of non-steroidals (which include Aleve, ibuprofen, Advil, Motrin, and naproxen); non-steroidals can cause long-term kidney damage Try to limit saturated fats in your diet (bologna, hot dogs, barbeque, cheeseburgers, hamburgers, steak, bacon, sausage, cheese, etc.) and get more fresh fruits, vegetables, and whole grains Check your feet every night and let me know right away of any sores, infections, numbness, etc. Try to limit sweets, white bread, white rice, white potatoes Do let Dr. Clayborn Bigness know about your episodes of chest pain, continue aspirin, call 911 if needed

## 2017-02-07 ENCOUNTER — Ambulatory Visit: Payer: Medicare Other | Admitting: Family Medicine

## 2017-02-08 ENCOUNTER — Other Ambulatory Visit: Payer: Self-pay | Admitting: Family Medicine

## 2017-02-08 DIAGNOSIS — E782 Mixed hyperlipidemia: Secondary | ICD-10-CM

## 2017-02-08 DIAGNOSIS — R7301 Impaired fasting glucose: Secondary | ICD-10-CM

## 2017-02-08 DIAGNOSIS — I739 Peripheral vascular disease, unspecified: Secondary | ICD-10-CM

## 2017-02-08 DIAGNOSIS — Z5181 Encounter for therapeutic drug level monitoring: Secondary | ICD-10-CM

## 2017-02-08 DIAGNOSIS — I779 Disorder of arteries and arterioles, unspecified: Secondary | ICD-10-CM

## 2017-02-08 LAB — CBC WITH DIFFERENTIAL/PLATELET
BASOS ABS: 0 {cells}/uL (ref 0–200)
BASOS PCT: 0 %
EOS ABS: 136 {cells}/uL (ref 15–500)
Eosinophils Relative: 4 %
HEMATOCRIT: 36.7 % — AB (ref 38.5–50.0)
HEMOGLOBIN: 11.8 g/dL — AB (ref 13.2–17.1)
Lymphocytes Relative: 32 %
Lymphs Abs: 1088 cells/uL (ref 850–3900)
MCH: 31.2 pg (ref 27.0–33.0)
MCHC: 32.2 g/dL (ref 32.0–36.0)
MCV: 97.1 fL (ref 80.0–100.0)
MONO ABS: 544 {cells}/uL (ref 200–950)
MPV: 10.6 fL (ref 7.5–12.5)
Monocytes Relative: 16 %
NEUTROS ABS: 1632 {cells}/uL (ref 1500–7800)
Neutrophils Relative %: 48 %
Platelets: 216 10*3/uL (ref 140–400)
RBC: 3.78 MIL/uL — ABNORMAL LOW (ref 4.20–5.80)
RDW: 13.7 % (ref 11.0–15.0)
WBC: 3.4 10*3/uL — ABNORMAL LOW (ref 3.8–10.8)

## 2017-02-08 LAB — LIPID PANEL
CHOL/HDL RATIO: 3.3 ratio (ref <5.0)
Cholesterol: 121 mg/dL (ref <200)
HDL: 37 mg/dL — AB (ref >40)
LDL CALC: 73 mg/dL (ref <100)
Triglycerides: 56 mg/dL (ref <150)
VLDL: 11 mg/dL (ref <30)

## 2017-02-08 LAB — COMPLETE METABOLIC PANEL WITH GFR
ALT: 12 U/L (ref 9–46)
AST: 20 U/L (ref 10–35)
Albumin: 3.7 g/dL (ref 3.6–5.1)
Alkaline Phosphatase: 67 U/L (ref 40–115)
BUN: 28 mg/dL — AB (ref 7–25)
CALCIUM: 9.3 mg/dL (ref 8.6–10.3)
CHLORIDE: 104 mmol/L (ref 98–110)
CO2: 29 mmol/L (ref 20–31)
CREATININE: 1.84 mg/dL — AB (ref 0.70–1.11)
GFR, Est African American: 37 mL/min — ABNORMAL LOW (ref >=60)
GFR, Est Non African American: 32 mL/min — ABNORMAL LOW (ref >=60)
Glucose, Bld: 107 mg/dL — ABNORMAL HIGH (ref 65–99)
Potassium: 4.7 mmol/L (ref 3.5–5.3)
Sodium: 140 mmol/L (ref 135–146)
Total Bilirubin: 0.5 mg/dL (ref 0.2–1.2)
Total Protein: 7 g/dL (ref 6.1–8.1)

## 2017-02-09 LAB — HEMOGLOBIN A1C
Hgb A1c MFr Bld: 6.2 % — ABNORMAL HIGH (ref <5.7)
Mean Plasma Glucose: 131 mg/dL

## 2017-04-03 IMAGING — CT CT ABD-PELV W/ CM
1 of 3 series · 13 of 32 positions shown, 18 images · IV contrast (APPLIED)
Comparison: None.

CLINICAL DATA: Questionable right lower quadrant mass on physical
exam, unexplained weight loss of 20 pounds over the last 6 months

EXAM:
CT ABDOMEN AND PELVIS WITH CONTRAST
TECHNIQUE: Multidetector CT imaging of the abdomen and pelvis was performed
using the standard protocol following bolus administration of
intravenous contrast.
CONTRAST:  80mL [id number] IOPAMIDOL ([id number]) INJECTION 61%

[Series 2: axial st · axial · 0.73mm/px · z∈[-1005,-550]mm · 13 of 103 slices shown, 18 images]
[im 6/103  soft-tissue]
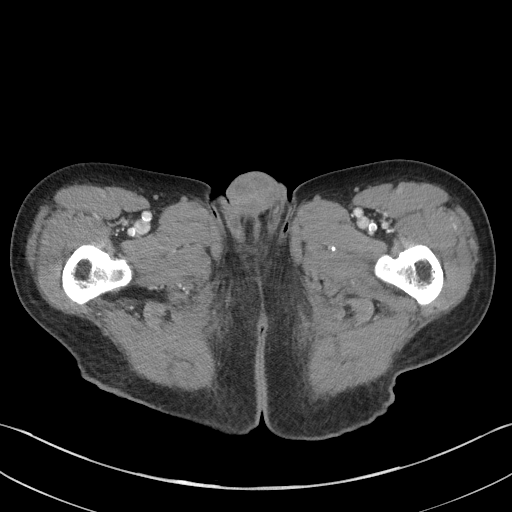
[im 6/103  bone]
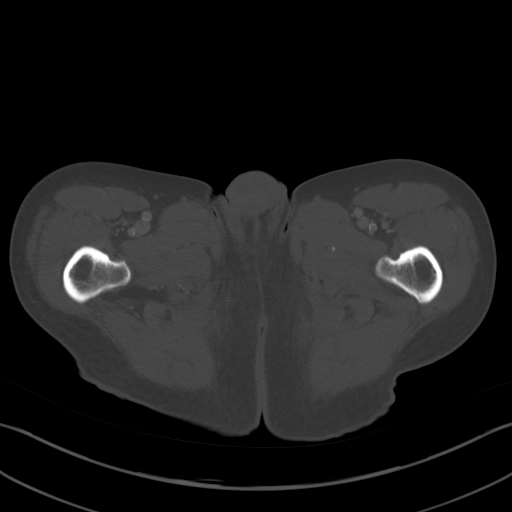
[im 16/103  soft-tissue]
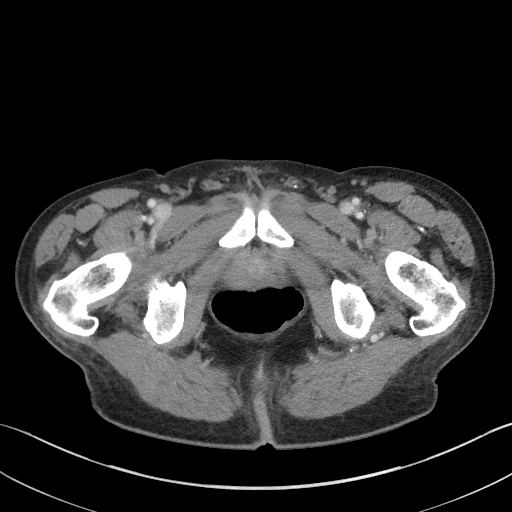
[im 21/103  soft-tissue]
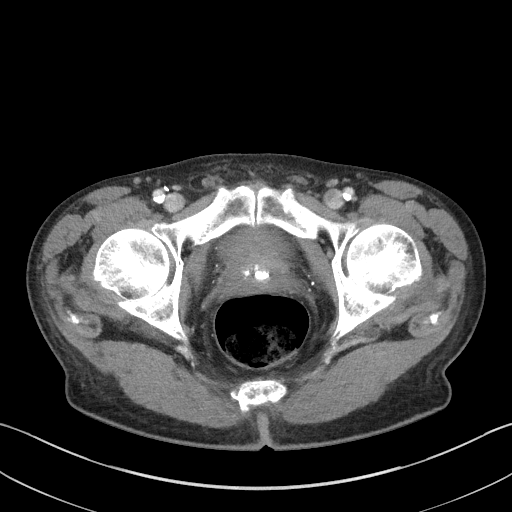
[im 31/103  soft-tissue]
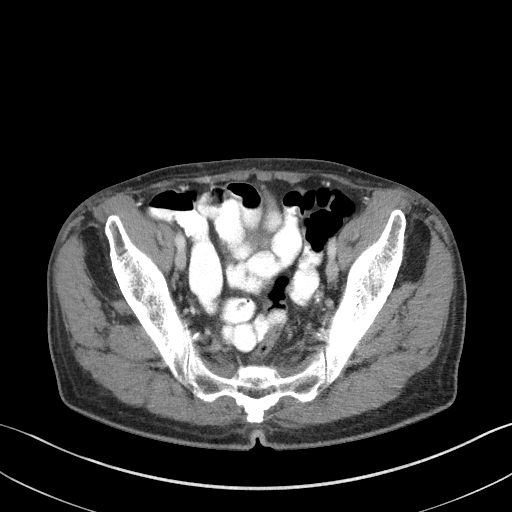
[im 41/103  soft-tissue]
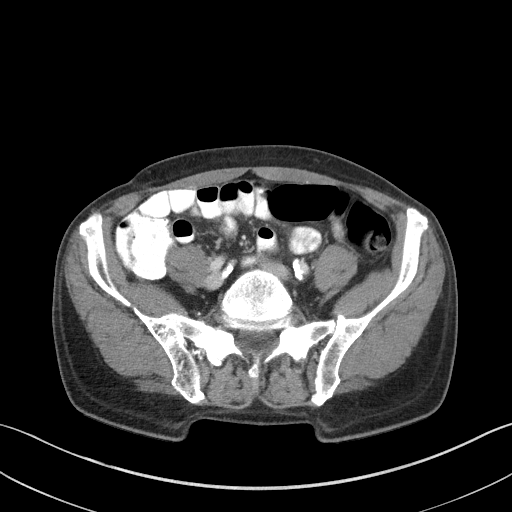
[im 46/103  soft-tissue]
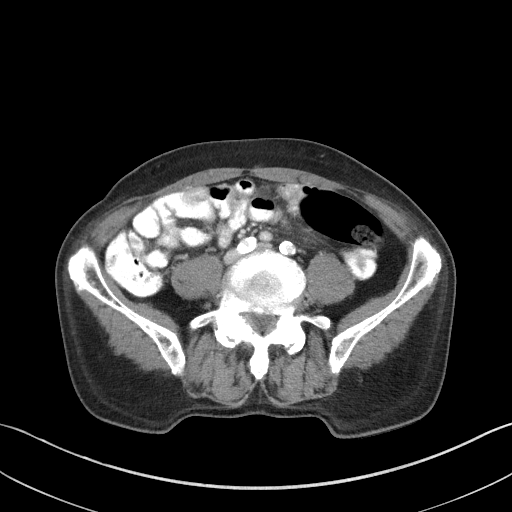
[im 57/103  soft-tissue]
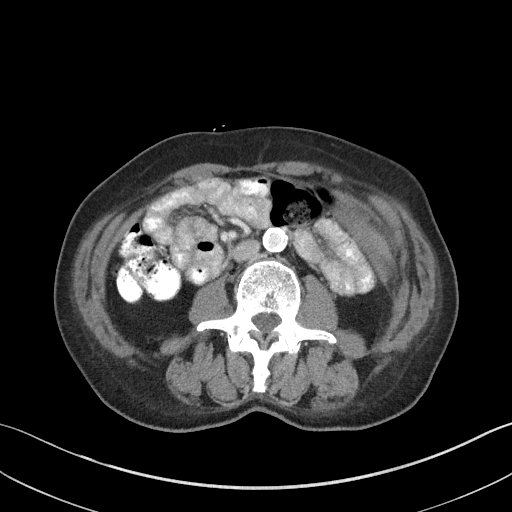
[im 62/103  soft-tissue]
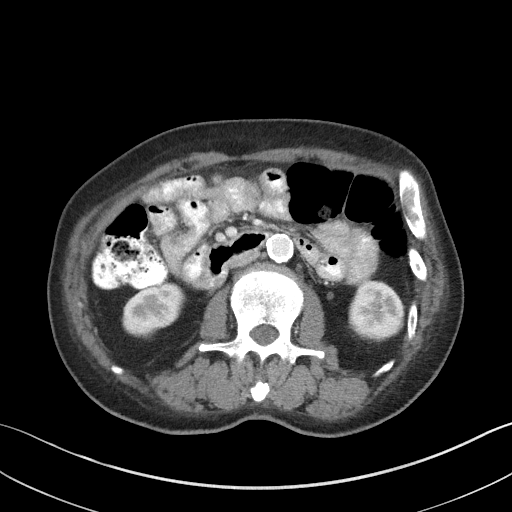
[im 72/103  soft-tissue]
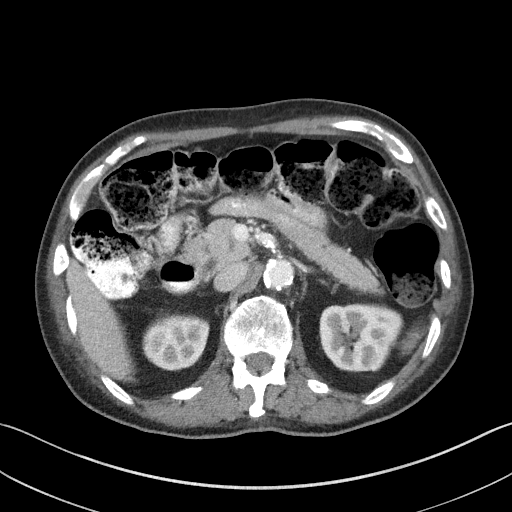
[im 72/103  bone]
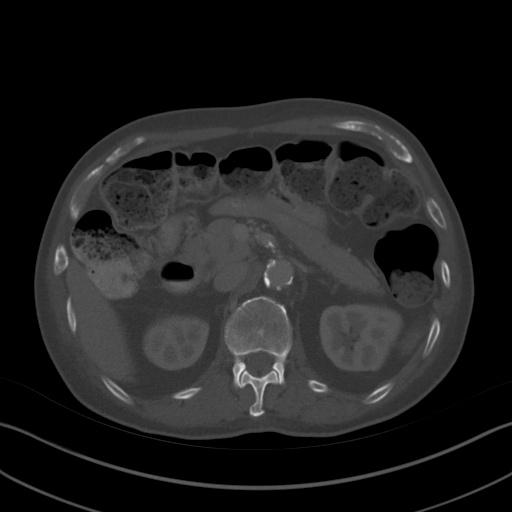
[im 82/103  soft-tissue]
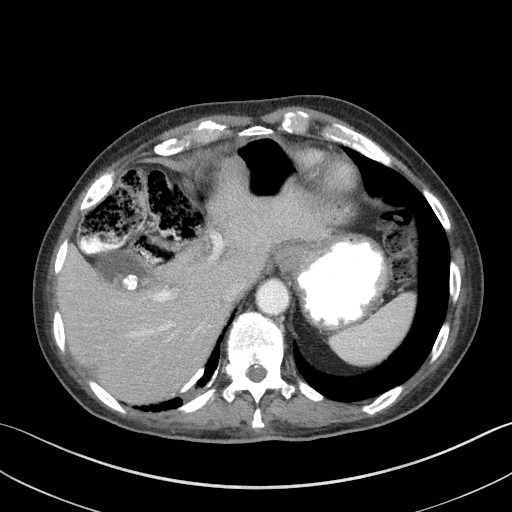
[im 82/103  lung]
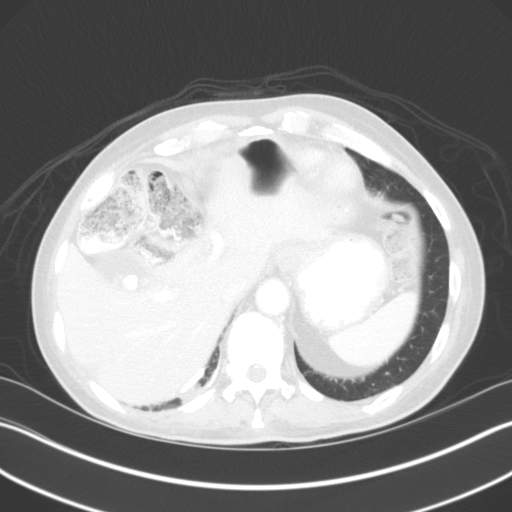
[im 87/103  soft-tissue]
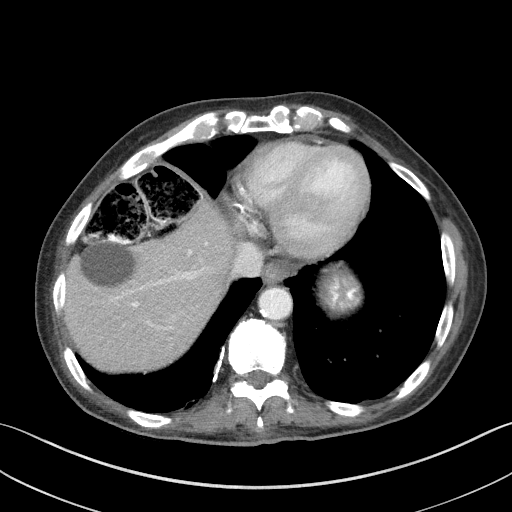
[im 87/103  lung]
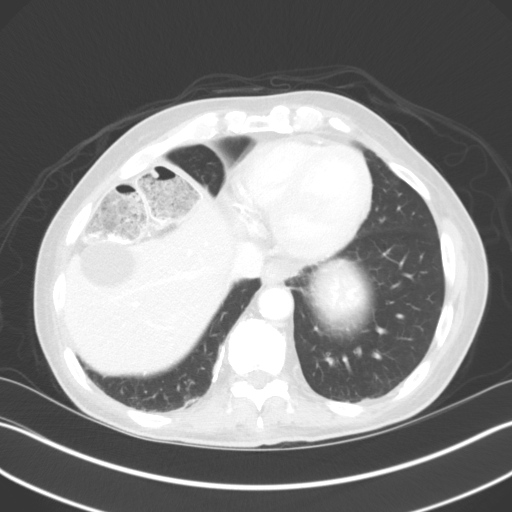
[im 92/103  lung]
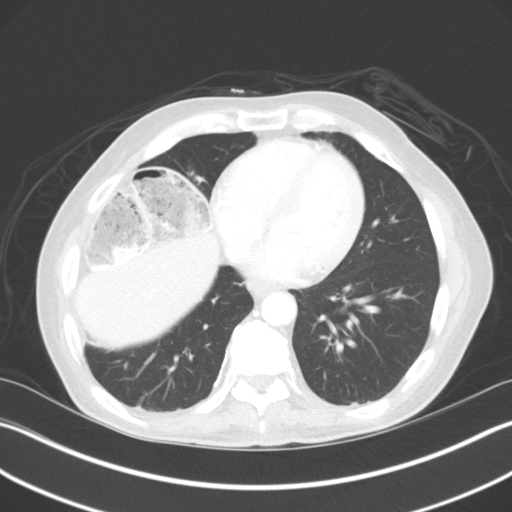
[im 97/103  soft-tissue]
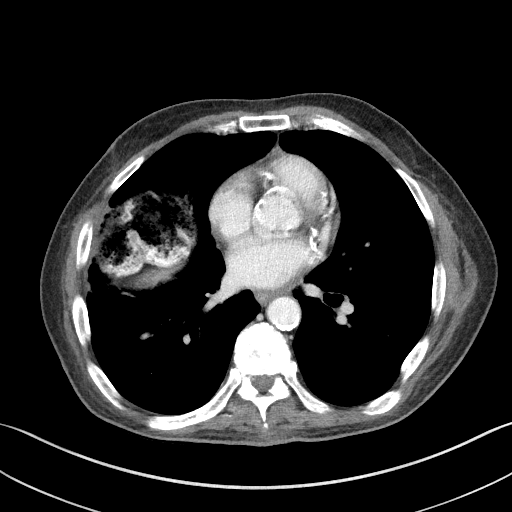
[im 97/103  lung]
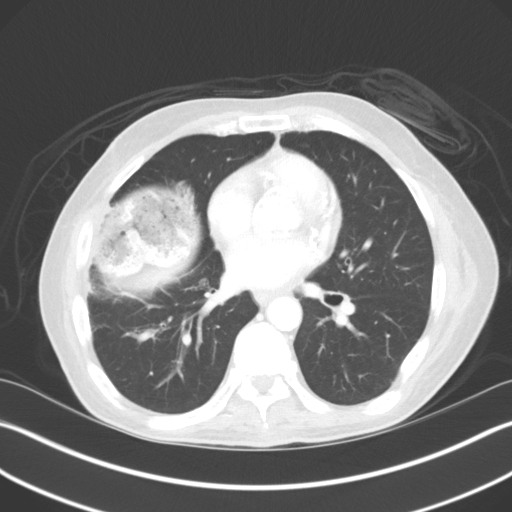

[13 of 32 positions shown; findings below may reference images not displayed]

FINDINGS: The lung bases are clear. Minimal linear scarring or atelectasis is
noted in the right middle lobe. Significant calcification of all 3
main coronary arteries are noted. The liver opacifies with no focal
abnormality and no ductal dilatation is seen. Incidental gallstones
layer within the gallbladder measuring up to 12 mm in diameter. The
hepatic flexure colon is interposed anteriorly between the anterior
abdominal wall and the liver. Pancreas is normal in size in the
pancreatic duct is not dilated. The adrenal glands and spleen are
unremarkable. The stomach is moderately distended with oral contrast
with no significant abnormality noted. The kidneys enhance with no
calculus or mass and on delayed images, the pelvocaliceal systems
are unremarkable. The proximal ureters are normal in caliber. There
is significant calcification of the abdominal aorta consistent with
aortic atherosclerosis.

The urinary bladder is moderately distended and slightly
thick-walled. This may indicate a degree of bladder outlet
obstruction. The prostate is within normal limits in size with
prostatic calculi present. There is feces throughout the colon. The
terminal ileum is unremarkable. The appendix by history has been
resected. No abdominal mass is seen. The area questioned clinically
on physical exam may represented a large amount of feces in the
right colon. Old healed fractures of the posterior left seventh and
eighth ribs are noted. There appears to be fusion of the right SI
joint and unilateral sacroiliitis cannot be excluded. The lumbar
vertebrae are normal alignment. Calcified disc is noted at L5-S1.
Degenerative change is present in the facet joints of L4-5 and
L5-S1.
IMPRESSION: 1. No abdominal mass or adenopathy is seen. The area in question may
have represented feces filled right colon.
2. Significant coronary artery calcifications.
3. Aortic atherosclerosis.
4. Multiple gallstones.
5. Question right sacroiliitis.  Correlate clinically.

## 2017-04-30 ENCOUNTER — Telehealth: Payer: Self-pay | Admitting: Family Medicine

## 2017-04-30 NOTE — Telephone Encounter (Signed)
Left detailed voicemail that Dr. lada is not prescriber

## 2017-04-30 NOTE — Telephone Encounter (Signed)
Pt needs refill on Losartan and Furosemide to be sent to [facility]

## 2017-05-17 ENCOUNTER — Other Ambulatory Visit: Payer: Self-pay | Admitting: Family Medicine

## 2017-05-17 NOTE — Telephone Encounter (Signed)
Last lipids and sgpt reviewed; Rx approved 

## 2017-05-29 ENCOUNTER — Ambulatory Visit (INDEPENDENT_AMBULATORY_CARE_PROVIDER_SITE_OTHER): Payer: Medicare Other

## 2017-05-29 ENCOUNTER — Ambulatory Visit (INDEPENDENT_AMBULATORY_CARE_PROVIDER_SITE_OTHER): Payer: Medicare Other | Admitting: Vascular Surgery

## 2017-05-29 DIAGNOSIS — I739 Peripheral vascular disease, unspecified: Secondary | ICD-10-CM | POA: Diagnosis not present

## 2017-05-29 DIAGNOSIS — I779 Disorder of arteries and arterioles, unspecified: Secondary | ICD-10-CM | POA: Diagnosis not present

## 2017-06-03 ENCOUNTER — Ambulatory Visit (INDEPENDENT_AMBULATORY_CARE_PROVIDER_SITE_OTHER): Payer: Medicare Other | Admitting: Vascular Surgery

## 2017-06-03 ENCOUNTER — Encounter (INDEPENDENT_AMBULATORY_CARE_PROVIDER_SITE_OTHER): Payer: Self-pay | Admitting: Vascular Surgery

## 2017-06-03 VITALS — Resp 15 | Ht 66.0 in | Wt 159.0 lb

## 2017-06-03 DIAGNOSIS — I2510 Atherosclerotic heart disease of native coronary artery without angina pectoris: Secondary | ICD-10-CM | POA: Diagnosis not present

## 2017-06-03 DIAGNOSIS — I739 Peripheral vascular disease, unspecified: Secondary | ICD-10-CM

## 2017-06-03 DIAGNOSIS — I779 Disorder of arteries and arterioles, unspecified: Secondary | ICD-10-CM | POA: Diagnosis not present

## 2017-06-03 DIAGNOSIS — E782 Mixed hyperlipidemia: Secondary | ICD-10-CM

## 2017-06-03 DIAGNOSIS — I771 Stricture of artery: Secondary | ICD-10-CM

## 2017-06-03 NOTE — Progress Notes (Signed)
MRN : [id number]  Samuel Bryan. is a 83 y.o. (14-Aug-1927) male who presents with chief complaint of  Chief Complaint  Patient presents with  . Re-evaluation    Discuss U/S results  .  History of Present Illness: The patient is seen for follow up evaluation of carotid stenosis. The carotid stenosis followed by ultrasound.   The patient denies amaurosis fugax. There is no recent history of TIA symptoms or focal motor deficits. There is no prior documented CVA.  The patient is taking enteric-coated aspirin 81 mg daily.  There is no history of migraine headaches. There is no history of seizures.  Claudication unchanged from last visit no ulcers or new wounds  The patient has a history of coronary artery disease, no recent episodes of angina or shortness of breath. There is a history of hyperlipidemia which is being treated with a statin.    Carotid Duplex done today shows >68% LICA.    ABI's unchanged compared to last study  No outpatient prescriptions have been marked as taking for the 06/03/17 encounter (Office Visit) with Delana Meyer, Dolores Lory, MD.    Past Medical History:  Diagnosis Date  . Atherosclerosis of renal artery ([place])   . Essential hypertension, benign   . Occlusion and stenosis of carotid artery without mention of cerebral infarction    moderate left ICA stenosis  . Peripheral vascular disease, unspecified ([place])    mild lifestyle limiting claudication  . Pure hypercholesterolemia   . Type 2 diabetes mellitus ([place])     Past Surgical History:  Procedure Laterality Date  . APPENDECTOMY  1970's  . COLONOSCOPY    . coronary artery stenting  1999   at [facility]   . FACIAL LACERATION REPAIR Left 12/13/2015   performed at [facility] by Dr. Marcial Pacas  . renal angiography      Social History Social History  Substance Use Topics  . Smoking status: Former Smoker    Types: Cigarettes    Quit date: 10/29/1970  . Smokeless tobacco: Never Used  . Alcohol use No     Family History Family History  Problem Relation Age of Onset  . Diabetes Mother   . Hypertension Brother   . Hyperlipidemia Brother   . Diabetes Brother   . Hypertension Son   . Hypertension Son   . Birth defects Neg Hx   . Stroke Neg Hx     No Known Allergies   REVIEW OF SYSTEMS (Negative unless checked)  Constitutional: [] Weight loss  [] Fever  [] Chills Cardiac: [] Chest pain   [] Chest pressure   [] Palpitations   [] Shortness of breath when laying flat   [] Shortness of breath with exertion. Vascular:  [x] Pain in legs with walking   [] Pain in legs at rest  [] History of DVT   [] Phlebitis   [] Swelling in legs   [] Varicose veins   [] Non-healing ulcers Pulmonary:   [] Uses home oxygen   [] Productive cough   [] Hemoptysis   [] Wheeze  [x] COPD   [] Asthma Neurologic:  [] Dizziness   [] Seizures   [] History of stroke   [] History of TIA  [] Aphasia   [] Vissual changes   [] Weakness or numbness in arm   [] Weakness or numbness in leg Musculoskeletal:   [] Joint swelling   [] Joint pain   [] Low back pain Hematologic:  [] Easy bruising  [] Easy bleeding   [] Hypercoagulable state   [] Anemic Gastrointestinal:  [] Diarrhea   [] Vomiting  [] Gastroesophageal reflux/heartburn   [] Difficulty swallowing. Genitourinary:  [x] Chronic kidney disease   [] Difficult urination  []   Frequent urination   [] Blood in urine Skin:  [] Rashes   [] Ulcers  Psychological:  [] History of anxiety   []  History of major depression.  Physical Examination  Vitals:   06/03/17 0837  Resp: 15  Weight: 159 lb (72.1 kg)  Height: 5\' 6"  (1.676 m)   Body mass index is 25.66 kg/m. Gen: WD/WN, NAD Head: [place]/AT, No temporalis wasting.  Ear/Nose/Throat: Hearing grossly intact, nares w/o erythema or drainage Eyes: PER, EOMI, sclera nonicteric.  Neck: Supple, no large masses.   Pulmonary:  Good air movement, no audible wheezing bilaterally, no use of accessory muscles.  Cardiac: RRR, no JVD Vascular: bilateral carotid bruits Vessel Right  Left  Radial Palpable Palpable  Brachial Palpable Palpable  Carotid Palpable Palpable  PT Not Palpable Not Palpable  DP Not Palpable Not Palpable  Gastrointestinal: Non-distended. No guarding/no peritoneal signs.  Musculoskeletal: M/S 5/5 throughout.  No deformity or atrophy.  Neurologic: CN 2-12 intact. Symmetrical.  Speech is fluent. Motor exam as listed above. Psychiatric: Judgment intact, Mood & affect appropriate for pt's clinical situation. Dermatologic: No rashes or ulcers noted.  No changes consistent with cellulitis. Lymph : No lichenification or skin changes of chronic lymphedema.  CBC Lab Results  Component Value Date   WBC 3.4 (L) 02/08/2017   HGB 11.8 (L) 02/08/2017   HCT 36.7 (L) 02/08/2017   MCV 97.1 02/08/2017   PLT 216 02/08/2017    BMET    Component Value Date/Time   NA 140 02/08/2017 0813   NA 137 04/09/2016 1008   NA 137 04/26/2012 1145   K 4.7 02/08/2017 0813   K 4.0 04/26/2012 1145   CL 104 02/08/2017 0813   CL 104 04/26/2012 1145   CO2 29 02/08/2017 0813   CO2 29 04/26/2012 1145   GLUCOSE 107 (H) 02/08/2017 0813   GLUCOSE 105 (H) 04/26/2012 1145   BUN 28 (H) 02/08/2017 0813   BUN 19 04/09/2016 1008   BUN 16 04/26/2012 1145   CREATININE 1.84 (H) 02/08/2017 0813   CALCIUM 9.3 02/08/2017 0813   CALCIUM 8.9 04/26/2012 1145   GFRNONAA 32 (L) 02/08/2017 0813   GFRAA 37 (L) 02/08/2017 0813   CrCl cannot be calculated (Patient's most recent lab result is older than the maximum 21 days allowed.).  COAG No results found for: INR, PROTIME  Radiology No results found.  Assessment/Plan 1. Bilateral carotid artery disease ([place]) The patient remains asymptomatic with respect to the carotid stenosis.  However, the patient has now progressed and has a lesion the is >70%.  Patient should undergo CT angiography of the carotid arteries to define the degree of stenosis of the internal carotid arteries bilaterally and the anatomic suitability for surgery vs.  intervention.  If the patient does indeed need surgery cardiac clearance will be required, once cleared the patient will be scheduled for surgery.  The risks, benefits and alternative therapies were reviewed in detail with the patient.  All questions were answered.  The patient agrees to proceed with imaging.  Continue antiplatelet therapy as prescribed. Continue management of CAD, HTN and Hyperlipidemia. Healthy heart diet, encouraged exercise at least 4 times per week.   - Ambulatory referral to Cardiology  2. Arteriosclerosis of coronary artery Continue cardiac and antihypertensive medications as already ordered and reviewed, no changes at this time.  Continue statin as ordered and reviewed, no changes at this time  Nitrates PRN for chest pain  - Ambulatory referral to Cardiology  3. Peripheral vascular disease ([place])  Recommend:  The patient has evidence of atherosclerosis of the lower extremities with claudication.  The patient does not voice lifestyle limiting changes at this point in time.  Noninvasive studies do not suggest clinically significant change.  No invasive studies, angiography or surgery at this time The patient should continue walking and begin a more formal exercise program.  The patient should continue antiplatelet therapy and aggressive treatment of the lipid abnormalities  No changes in the patient's medications at this time  The patient should continue wearing graduated compression socks 10-15 mmHg strength to control the mild edema.    4. Mixed hyperlipidemia Continue statin as ordered and reviewed, no changes at this time   5. Stricture of artery ([place]) The patient remains asymptomatic with respect to the carotid stenosis.  However, the patient has now progressed and has a lesion the is >70%.  Patient should undergo CT angiography of the carotid arteries to define the degree of stenosis of the internal carotid arteries bilaterally and the anatomic  suitability for surgery vs. intervention.  If the patient does indeed need surgery cardiac clearance will be required, once cleared the patient will be scheduled for surgery.  The risks, benefits and alternative therapies were reviewed in detail with the patient.  All questions were answered.  The patient agrees to proceed with imaging.  Continue antiplatelet therapy as prescribed. Continue management of CAD, HTN and Hyperlipidemia. Healthy heart diet, encouraged exercise at least 4 times per week.   - CT ANGIO NECK W OR WO CONTRAST; Future    Samuel Pilar, MD  06/03/2017 10:16 AM

## 2017-06-17 ENCOUNTER — Ambulatory Visit
Admission: RE | Admit: 2017-06-17 | Discharge: 2017-06-17 | Disposition: A | Discharge status: Home / Self Care | Payer: Medicare Other | Source: Ambulatory Visit | Attending: Vascular Surgery | Admitting: Vascular Surgery

## 2017-06-17 DIAGNOSIS — I700 Atherosclerosis of aorta: Secondary | ICD-10-CM | POA: Insufficient documentation

## 2017-06-17 DIAGNOSIS — J439 Emphysema, unspecified: Secondary | ICD-10-CM | POA: Insufficient documentation

## 2017-06-17 DIAGNOSIS — I771 Stricture of artery: Secondary | ICD-10-CM | POA: Diagnosis present

## 2017-06-17 DIAGNOSIS — I6522 Occlusion and stenosis of left carotid artery: Secondary | ICD-10-CM | POA: Insufficient documentation

## 2017-06-17 HISTORY — DX: Disorder of kidney and ureter, unspecified: N28.9

## 2017-06-17 LAB — POCT I-STAT CREATININE: Creatinine, Ser: 1.6 mg/dL — ABNORMAL HIGH (ref 0.61–1.24)

## 2017-06-17 MED ORDER — IOPAMIDOL (ISOVUE-370) INJECTION 76%
75.0000 mL | Freq: Once | INTRAVENOUS | Status: AC | PRN
  Administered 2017-06-17: 60 mL via INTRAVENOUS

## 2017-08-09 ENCOUNTER — Ambulatory Visit (INDEPENDENT_AMBULATORY_CARE_PROVIDER_SITE_OTHER): Payer: Medicare Other | Admitting: Family Medicine

## 2017-08-09 ENCOUNTER — Encounter: Payer: Self-pay | Admitting: Family Medicine

## 2017-08-09 ENCOUNTER — Ambulatory Visit
Admission: RE | Admit: 2017-08-09 | Discharge: 2017-08-09 | Disposition: A | Discharge status: Home / Self Care | Payer: Medicare Other | Source: Ambulatory Visit | Attending: Family Medicine | Admitting: Family Medicine

## 2017-08-09 VITALS — BP 147/60 | HR 90 | Temp 98.0°F | Resp 16 | Wt 153.6 lb

## 2017-08-09 DIAGNOSIS — R079 Chest pain, unspecified: Secondary | ICD-10-CM

## 2017-08-09 DIAGNOSIS — R195 Other fecal abnormalities: Secondary | ICD-10-CM | POA: Insufficient documentation

## 2017-08-09 DIAGNOSIS — M25512 Pain in left shoulder: Secondary | ICD-10-CM | POA: Diagnosis not present

## 2017-08-09 DIAGNOSIS — Z23 Encounter for immunization: Secondary | ICD-10-CM | POA: Diagnosis not present

## 2017-08-09 DIAGNOSIS — I700 Atherosclerosis of aorta: Secondary | ICD-10-CM | POA: Diagnosis not present

## 2017-08-09 DIAGNOSIS — R634 Abnormal weight loss: Secondary | ICD-10-CM

## 2017-08-09 DIAGNOSIS — I2510 Atherosclerotic heart disease of native coronary artery without angina pectoris: Secondary | ICD-10-CM | POA: Diagnosis not present

## 2017-08-09 DIAGNOSIS — E782 Mixed hyperlipidemia: Secondary | ICD-10-CM | POA: Diagnosis not present

## 2017-08-09 DIAGNOSIS — N184 Chronic kidney disease, stage 4 (severe): Secondary | ICD-10-CM

## 2017-08-09 DIAGNOSIS — R7301 Impaired fasting glucose: Secondary | ICD-10-CM

## 2017-08-09 DIAGNOSIS — Z5181 Encounter for therapeutic drug level monitoring: Secondary | ICD-10-CM

## 2017-08-09 DIAGNOSIS — M19012 Primary osteoarthritis, left shoulder: Secondary | ICD-10-CM | POA: Insufficient documentation

## 2017-08-09 MED ORDER — NITROGLYCERIN 0.4 MG SL SUBL: 0.4000 mg | SUBLINGUAL_TABLET | SUBLINGUAL | 3 refills | Status: DC | PRN

## 2017-08-09 NOTE — Assessment & Plan Note (Signed)
Check liver and kidney

## 2017-08-09 NOTE — Assessment & Plan Note (Addendum)
Off and on chest pain for years; seeing cardiologist, I personally called Dr. Etta Quill office to discuss sx, try to get him seen today; NTG written to use if needed; EKG today; call 911 if any recurrence

## 2017-08-09 NOTE — Assessment & Plan Note (Signed)
Check lipids 

## 2017-08-09 NOTE — Assessment & Plan Note (Signed)
Last A1c showed good control; will check again; limit sweets

## 2017-08-09 NOTE — Patient Instructions (Addendum)
Please see Dr. Clayborn Bigness about your chest pain right now Afterwards, go to the xray facility across the street and have the chest xray and shoulder xray done We'll get labs today as well If you have not heard anything from my staff in a week about any orders/referrals/studies from today, please contact us here to follow-up [phone] If you need something for aches or pains, try to use Tylenol (acetaminophen) instead of non-steroidals (which include Aleve, ibuprofen, Advil, Motrin, and naproxen); non-steroidals can cause long-term kidney damage   Coronary Artery Disease, Male Coronary artery disease (CAD) is a condition in which the arteries that lead to the heart (coronary arteries) become narrow or blocked. The narrowing or blockage can lead to decreased blood flow to the heart. Prolonged reduced blood flow can cause a heart attack (myocardial infarction or MI). This condition may also be called coronary heart disease. Because CAD is the leading cause of death in men, it is important to understand what causes this condition and how it is treated. What are the causes? CAD is most often caused by atherosclerosis. This is the buildup of fat and cholesterol (plaque) on the inside of the arteries. Over time, the plaque may narrow or block the artery, reducing blood flow to the heart. Plaque can also become weak and break off within a coronary artery and cause a sudden blockage. Other less common causes of CAD include:  An embolism or blood clot in a coronary artery.  A tearing of the artery (spontaneous coronary artery dissection).  An aneurysm.  Inflammation (vasculitis) in the artery wall.  What increases the risk? The following factors may make you more likely to develop this condition:  Age. Men over age 7 are at a greater risk of CAD.  Family history of CAD.  Gender. Men often develop CAD earlier in life than women.  High blood pressure (hypertension).  Diabetes.  High  cholesterol levels.  Tobacco use.  Excessive alcohol use.  Lack of exercise.  A diet high in saturated and trans fats, such as fried food and processed meat.  Other possible risk factors include:  High stress levels.  Depression.  Obesity.  Sleep apnea.  What are the signs or symptoms? Many people do not have any symptoms during the early stages of CAD. As the condition progresses, symptoms may include:  Chest pain (angina). The pain can: ? Feel like a crushing or squeezing, or a tightness, pressure, fullness, or heaviness in the chest. ? Last more than a few minutes or can stop and recur. The pain tends to get worse with exercise or stress and to fade with rest.  Pain in the arms, neck, jaw, or back.  Unexplained heartburn or indigestion.  Shortness of breath.  Nausea or vomiting.  Sudden light-headedness.  Sudden cold sweats.  Fluttering or fast heartbeat (palpitations).  How is this diagnosed? This condition is diagnosed based on:  Your family and medical history.  A physical exam.  Tests, including: ? A test to check the electrical signals in your heart (electrocardiogram). ? Exercise stress test. This looks for signs of blockage when the heart is stressed with exercise, such as running on a treadmill. ? Pharmacologic stress test. This test looks for signs of blockage when the heart is being stressed with a medicine. ? Blood tests. ? Coronary angiogram. This is a procedure to look at the coronary arteries to see if there is any blockage. During this test, a dye is injected into your arteries so  they appear on an X-ray. ? A test that uses sound waves to take a picture of your heart (echocardiogram). ? Chest X-ray.  How is this treated? This condition may be treated by:  Healthy lifestyle changes to reduce risk factors.  Medicines such as: ? Antiplatelet medicines and blood-thinning medicines, such as aspirin. These help to prevent blood  clots. ? Nitroglycerin. ? Blood pressure medicines. ? Cholesterol-lowering medicine.  Coronary angioplasty and stenting. During this procedure, a thin, flexible tube is inserted through a blood vessel and into a blocked artery. A balloon or similar device on the end of the tube is inflated to open up the artery. In some cases, a small, mesh tube (stent) is inserted into the artery to keep it open.  Coronary artery bypass surgery. During this surgery, veins or arteries from other parts of the body are used to create a bypass around the blockage and allow blood to reach your heart.  Follow these instructions at home: Medicines  Take over-the-counter and prescription medicines only as told by your health care provider.  Do not take the following medicines unless your health care provider approves: ? NSAIDs, such as ibuprofen, naproxen, or celecoxib. ? Vitamin supplements that contain vitamin A, vitamin E, or both. Lifestyle  Follow an exercise program approved by your health care provider. Aim for 150 minutes of moderate exercise or 75 minutes of vigorous exercise each week.  Maintain a healthy weight or lose weight as approved by your health care provider.  Rest when you are tired.  Learn to manage stress or try to limit your stress. Ask your health care provider for suggestions if you need help.  Get screened for depression and seek treatment, if needed.  Do not use any products that contain nicotine or tobacco, such as cigarettes and e-cigarettes. If you need help quitting, ask your health care provider.  Do not use illegal drugs. Eating and drinking  Follow a heart-healthy diet. A dietitian can help educate you about healthy food options and changes. In general, eat plenty of fruits and vegetables, lean meats, and whole grains.  Avoid foods high in: ? Sugar. ? Salt (sodium). ? Saturated fat, such as processed or fatty meat. ? Trans fat, such as fried foods.  Use healthy  cooking methods such as roasting, grilling, broiling, baking, poaching, steaming, or stir-frying.  If you drink alcohol, and your health care provider approves, limit your alcohol intake to no more than 2 drinks per day. One drink equals 12 ounces of beer, 5 ounces of wine, or 1 ounces of hard liquor. General instructions  Manage any other health conditions, such as hypertension and diabetes. These conditions affect your heart.  Your health care provider may ask you to monitor your blood pressure. Ideally, your blood pressure should be below 130/80.  Keep all follow-up visits as told by your health care provider. This is important. Get help right away if:  You have pain in your chest, neck, arm, jaw, stomach, or back that: ? Lasts more than a few minutes. ? Is recurring. ? Is not relieved by taking medicine under your tongue (sublingualnitroglycerin).  You have too much (profuse) sweating without cause.  You have unexplained: ? Heartburn or indigestion. ? Shortness of breath or difficulty breathing. ? Fluttering or fast heartbeat (palpitations). ? Nausea or vomiting. ? Fatigue. ? Feelings of nervousness or anxiety. ? Weakness. ? Diarrhea.  You have sudden light-headedness or dizziness.  You faint.  You feel like hurting yourself or think  about taking your own life. These symptoms may represent a serious problem that is an emergency. Do not wait to see if the symptoms will go away. Get medical help right away. Call your local emergency services (911 in the [place] Do not drive yourself to the hospital. Summary  Coronary artery disease (CAD) is a process in which the arteries that lead to the heart (coronary arteries) become narrow or blocked. The narrowing or blockage can lead to a heart attack.  Many people do not have any symptoms during the early stages of CAD. This is called "silent CAD."  CAD can be treated with lifestyle changes, medicines, surgery, or a combination of  these treatments. This information is not intended to replace advice given to you by your health care provider. Make sure you discuss any questions you have with your health care provider. Document Released: 05/12/2014 Document Revised: 10/05/2016 Document Reviewed: 10/05/2016 Elsevier Interactive Patient Education  2017 [employer].

## 2017-08-09 NOTE — Assessment & Plan Note (Signed)
Check creatinine and K+

## 2017-08-09 NOTE — Progress Notes (Signed)
BP (!) 147/60   Pulse 90   Temp 98 F (36.7 C) (Oral)   Resp 16   Wt 153 lb 9.6 oz (69.7 kg)   SpO2 99%   BMI 24.79 kg/m    Subjective:    Patient ID: Samuel Lope., male    DOB: Jan 04, 1927, 83 y.o.   MRN: [id number]  HPI: Samuel Rodger. is a 83 y.o. male  Chief Complaint  Patient presents with  . Follow-up    6 months    HPI Patient is here for f/u Family member with him today He has chest pain at times; over the left upper chest; belches and it's gone; sees Dr. Clayborn Bigness, cardiologist; May 8th; doesn't pain much, just when eating sweet potatoes, like gas; belches and it goes away; no [facility]; gets tired when he walks; had an echo in May; he reiterates it is just when he eats; no heartburn or acid in his mouth; no blood in the stool He has had some left shoulder discomfort; cannot reach above his head, cannot reach across the body; two weeks duration; not related at all to the chest pain Not aware of any palpitations; having some cough, non productive; no swelling in legs;  He does not think he has lost weight, but he is 12 pounds down over last 6 months He has been mowing a lot, weed eating; no pain with mowing or weed eating He recently had a CT angio of the neck, August 20th, seeing vascular specialist Dr. Ronalee Belts; also has carotid artery stenosis and PVD and saw Dr. Ronalee Belts for that in August Last lipid panel reviewed, HDL low at 37 CT scan done by Dr. Bary Castilla in June 2017  Lab Results  Component Value Date   HGBA1C 6.2 (H) 02/08/2017    Depression screen PHQ 2/9 08/09/2017 02/06/2017 01/16/2017 12/04/2016 08/08/2016  Decreased Interest 0 0 0 0 0  Down, Depressed, Hopeless 0 0 0 0 0  PHQ - 2 Score 0 0 0 0 0    Relevant past medical, surgical, family and social history reviewed Past Medical History:  Diagnosis Date  . Atherosclerosis of renal artery ([place])   . Essential hypertension, benign   . Occlusion and stenosis of carotid artery without mention of cerebral  infarction    moderate left ICA stenosis  . Peripheral vascular disease, unspecified ([place])    mild lifestyle limiting claudication  . Pure hypercholesterolemia   . Renal insufficiency   . Type 2 diabetes mellitus ([place])    Past Surgical History:  Procedure Laterality Date  . APPENDECTOMY  1970's  . COLONOSCOPY    . coronary artery stenting  1999   at [facility]   . FACIAL LACERATION REPAIR Left 12/13/2015   performed at [facility] by Dr. Marcial Pacas  . renal angiography     Family History  Problem Relation Age of Onset  . Diabetes Mother   . Hypertension Brother   . Hyperlipidemia Brother   . Diabetes Brother   . Hypertension Son   . Hypertension Son   . Birth defects Neg Hx   . Stroke Neg Hx    Social History   Social History  . Marital status: Widowed    Spouse name: N/A  . Number of children: N/A  . Years of education: N/A   Occupational History  . Not on file.   Social History Main Topics  . Smoking status: Former Smoker    Types: Cigarettes    Quit date: 10/29/1970  .  Smokeless tobacco: Never Used  . Alcohol use No  . Drug use: No  . Sexual activity: Not Currently   Other Topics Concern  . Not on file   Social History Narrative   Married, all children are healthy.    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     Interim medical history since last visit reviewed. Allergies and medications reviewed  Review of Systems  Cardiovascular: Positive for chest pain.  Musculoskeletal: Positive for arthralgias (left shoulder pain).   Per HPI unless specifically indicated above     Objective:    BP (!) 147/60   Pulse 90   Temp 98 F (36.7 C) (Oral)   Resp 16   Wt 153 lb 9.6 oz (69.7 kg)   SpO2 99%   BMI 24.79 kg/m   Wt Readings from Last 3 Encounters:  08/09/17 153 lb 9.6 oz (69.7 kg)  06/03/17 159 lb (72.1 kg)  02/06/17 165 lb 1.6 oz (74.9 kg)    Physical Exam  Constitutional: He appears well-developed and  well-nourished. No distress.  Weight down nearly 12 pounds in 6 months  HENT:  Head: Normocephalic and atraumatic.  Right Ear: Decreased hearing is noted.  Left Ear: Decreased hearing is noted.  Very hard of hearing  Eyes: EOM are normal. No scleral icterus.  Neck: No JVD present. Carotid bruit is not present. No thyromegaly present.  Cardiovascular: Normal rate and regular rhythm.   Pulses:      Radial pulses are 2+ on the right side, and 2+ on the left side.       Dorsalis pedis pulses are 1+ on the right side, and 1+ on the left side.  Feet are not cool to the touch  Pulmonary/Chest: Effort normal and breath sounds normal. He exhibits no tenderness.  Abdominal: Soft. Bowel sounds are normal. He exhibits no distension. There is no tenderness. There is no guarding.  Musculoskeletal: He exhibits no edema.       Right knee: He exhibits no swelling and no effusion. No tenderness found.       Left knee: He exhibits no swelling and no effusion. No tenderness found.  Neurological: He is alert.  Skin: Skin is warm and dry. He is not diaphoretic. No pallor.  Scar left cheek  Psychiatric: He has a normal mood and affect. His behavior is normal. Judgment and thought content normal. His mood appears not anxious. He does not exhibit a depressed mood.    Results for orders placed or performed during the hospital encounter of 06/17/17  I-STAT creatinine  Result Value Ref Range   Creatinine, Ser 1.60 (H) 0.61 - 1.24 mg/dL      Assessment & Plan:   Problem List Items Addressed This Visit      Cardiovascular and Mediastinum   Arteriosclerosis of coronary artery    Off and on chest pain for years; seeing cardiologist, I personally called Dr. Etta Quill office to discuss sx, try to get him seen today; NTG written to use if needed; EKG today; call 911 if any recurrence      Relevant Medications   nitroGLYCERIN (NITROSTAT) 0.4 MG SL tablet   Other Relevant Orders   EKG 12-Lead   Microalbumin  / creatinine urine ratio     Endocrine   Impaired fasting glucose - Primary (Chronic)    Last A1c showed good control; will check again; limit sweets      Relevant Orders  Microalbumin / creatinine urine ratio   Hemoglobin A1c     Genitourinary   Chronic kidney disease, stage IV (severe) (HCC)    Check creatinine and K+      Relevant Orders   Microalbumin / creatinine urine ratio     Other   Medication monitoring encounter    Check liver and kidney      Relevant Orders   COMPLETE METABOLIC PANEL WITH GFR   Hyperlipidemia (Chronic)    Check lipids      Relevant Medications   nitroGLYCERIN (NITROSTAT) 0.4 MG SL tablet   Other Relevant Orders   Lipid panel    Other Visit Diagnoses    Needs flu shot       Relevant Orders   Flu vaccine HIGH DOSE PF (Fluzone High dose) (Completed)   Chest pain at rest       EKG done, normal, I personally called cardiologist; will get labs, CXR; discussed risk of MI with patient, Rx for NTG; see AVS   Relevant Orders   EKG 12-Lead   Weight loss       abnormal weight loss; will get labs today; send for CXR   Relevant Orders   TSH   T4, free   DG Chest 2 View   CBC with Differential/Platelet   Acute pain of left shoulder       Relevant Orders   DG Shoulder Left      Follow up plan: Return in about 3 weeks (around 08/30/2017) for follow-up visit with Dr. Sanda Klein.  An after-visit summary was printed and given to the patient at [place].  Please see the patient instructions which may contain other information and recommendations beyond what is mentioned above in the assessment and plan.  Meds ordered this encounter  Medications  . nitroGLYCERIN (NITROSTAT) 0.4 MG SL tablet    Sig: Place 1 tablet (0.4 mg total) under the tongue every 5 (five) minutes as needed for chest pain. Max of 3 pills; call 911    Dispense:  25 tablet    Refill:  3    Orders Placed This Encounter  Procedures  . DG Chest 2 View  . DG Shoulder Left  . Flu  vaccine HIGH DOSE PF (Fluzone High dose)  . Microalbumin / creatinine urine ratio  . Lipid panel  . Hemoglobin A1c  . COMPLETE METABOLIC PANEL WITH GFR  . TSH  . T4, free  . CBC with Differential/Platelet  . EKG 12-Lead  12 lead EKG: NSR, no ST-T wave changes  Cardiologist graciously agreed to see patient today, told me to send him right on over

## 2017-08-10 LAB — COMPLETE METABOLIC PANEL WITH GFR
AG RATIO: 1.2 (calc) (ref 1.0–2.5)
ALT: 11 U/L (ref 9–46)
AST: 18 U/L (ref 10–35)
Albumin: 4.3 g/dL (ref 3.6–5.1)
Alkaline phosphatase (APISO): 85 U/L (ref 40–115)
BILIRUBIN TOTAL: 0.6 mg/dL (ref 0.2–1.2)
BUN/Creatinine Ratio: 16 (calc) (ref 6–22)
BUN: 26 mg/dL — ABNORMAL HIGH (ref 7–25)
CHLORIDE: 102 mmol/L (ref 98–110)
CO2: 30 mmol/L (ref 20–32)
Calcium: 9.5 mg/dL (ref 8.6–10.3)
Creat: 1.63 mg/dL — ABNORMAL HIGH (ref 0.70–1.11)
GFR, EST AFRICAN AMERICAN: 43 mL/min/{1.73_m2} — AB (ref >=60)
GFR, Est Non African American: 37 mL/min/{1.73_m2} — ABNORMAL LOW (ref >=60)
Globulin: 3.7 g/dL (calc) (ref 1.9–3.7)
Glucose, Bld: 125 mg/dL — ABNORMAL HIGH (ref 65–99)
POTASSIUM: 4.3 mmol/L (ref 3.5–5.3)
Sodium: 140 mmol/L (ref 135–146)
TOTAL PROTEIN: 8 g/dL (ref 6.1–8.1)

## 2017-08-10 LAB — TSH: TSH: 1.76 m[IU]/L (ref 0.40–4.50)

## 2017-08-10 LAB — HEMOGLOBIN A1C
HEMOGLOBIN A1C: 6.7 %{Hb} — AB (ref <5.7)
Mean Plasma Glucose: 146 (calc)
eAG (mmol/L): 8.1 (calc)

## 2017-08-10 LAB — CBC WITH DIFFERENTIAL/PLATELET
Basophils Absolute: 9 cells/uL (ref 0–200)
Basophils Relative: 0.2 %
EOS ABS: 62 {cells}/uL (ref 15–500)
Eosinophils Relative: 1.4 %
HCT: 38.3 % — ABNORMAL LOW (ref 38.5–50.0)
Hemoglobin: 12.4 g/dL — ABNORMAL LOW (ref 13.2–17.1)
Lymphs Abs: 920 cells/uL (ref 850–3900)
MCH: 30.4 pg (ref 27.0–33.0)
MCHC: 32.4 g/dL (ref 32.0–36.0)
MCV: 93.9 fL (ref 80.0–100.0)
MONOS PCT: 13.4 %
MPV: 11.3 fL (ref 7.5–12.5)
NEUTROS PCT: 64.1 %
Neutro Abs: 2820 cells/uL (ref 1500–7800)
PLATELETS: 278 10*3/uL (ref 140–400)
RBC: 4.08 10*6/uL — ABNORMAL LOW (ref 4.20–5.80)
RDW: 12.3 % (ref 11.0–15.0)
TOTAL LYMPHOCYTE: 20.9 %
WBC: 4.4 10*3/uL (ref 3.8–10.8)
WBCMIX: 590 {cells}/uL (ref 200–950)

## 2017-08-10 LAB — MICROALBUMIN / CREATININE URINE RATIO
CREATININE, URINE: 79 mg/dL (ref 20–320)
MICROALB UR: 4 mg/dL
Microalb Creat Ratio: 51 mcg/mg creat — ABNORMAL HIGH (ref <30)

## 2017-08-10 LAB — LIPID PANEL
CHOLESTEROL: 139 mg/dL (ref <200)
HDL: 59 mg/dL (ref >40)
LDL CHOLESTEROL (CALC): 66 mg/dL
Non-HDL Cholesterol (Calc): 80 mg/dL (calc) (ref <130)
Total CHOL/HDL Ratio: 2.4 (calc) (ref <5.0)
Triglycerides: 49 mg/dL (ref <150)

## 2017-08-10 LAB — T4, FREE: Free T4: 1.2 ng/dL (ref 0.8–1.8)

## 2017-08-12 ENCOUNTER — Other Ambulatory Visit: Payer: Self-pay

## 2017-08-12 NOTE — Telephone Encounter (Signed)
I don't appear to prescribe that for him Please have him contact the prescriber (either nephrology or cardiology, I would guess) Thank you

## 2017-08-13 ENCOUNTER — Other Ambulatory Visit: Payer: Self-pay | Admitting: Family Medicine

## 2017-08-13 DIAGNOSIS — E119 Type 2 diabetes mellitus without complications: Secondary | ICD-10-CM | POA: Insufficient documentation

## 2017-08-13 DIAGNOSIS — N183 Chronic kidney disease, stage 3 (moderate): Principal | ICD-10-CM

## 2017-08-13 DIAGNOSIS — E1122 Type 2 diabetes mellitus with diabetic chronic kidney disease: Secondary | ICD-10-CM | POA: Insufficient documentation

## 2017-08-13 DIAGNOSIS — N185 Chronic kidney disease, stage 5: Secondary | ICD-10-CM | POA: Insufficient documentation

## 2017-08-13 MED ORDER — LINAGLIPTIN 5 MG PO TABS: 5.0000 mg | ORAL_TABLET | Freq: Every day | ORAL | 2 refills | Status: DC

## 2017-08-13 NOTE — Progress Notes (Signed)
New dx, start med, appt, refer to diab education

## 2017-08-13 NOTE — Assessment & Plan Note (Signed)
Refer to diabetic education; start tradjenta; appt within one week

## 2017-08-13 NOTE — Telephone Encounter (Signed)
Patient daughter was notified

## 2017-08-19 ENCOUNTER — Encounter: Payer: Self-pay | Admitting: Family Medicine

## 2017-08-19 ENCOUNTER — Ambulatory Visit (INDEPENDENT_AMBULATORY_CARE_PROVIDER_SITE_OTHER): Payer: Medicare Other | Admitting: Family Medicine

## 2017-08-19 VITALS — BP 152/60 | HR 89 | Ht 66.0 in | Wt 158.6 lb

## 2017-08-19 DIAGNOSIS — N184 Chronic kidney disease, stage 4 (severe): Secondary | ICD-10-CM

## 2017-08-19 DIAGNOSIS — K5900 Constipation, unspecified: Secondary | ICD-10-CM

## 2017-08-19 DIAGNOSIS — R809 Proteinuria, unspecified: Secondary | ICD-10-CM | POA: Diagnosis not present

## 2017-08-19 DIAGNOSIS — N183 Chronic kidney disease, stage 3 (moderate): Secondary | ICD-10-CM

## 2017-08-19 DIAGNOSIS — M19012 Primary osteoarthritis, left shoulder: Secondary | ICD-10-CM | POA: Diagnosis not present

## 2017-08-19 DIAGNOSIS — I2510 Atherosclerotic heart disease of native coronary artery without angina pectoris: Secondary | ICD-10-CM | POA: Diagnosis not present

## 2017-08-19 DIAGNOSIS — E1122 Type 2 diabetes mellitus with diabetic chronic kidney disease: Secondary | ICD-10-CM | POA: Diagnosis not present

## 2017-08-19 DIAGNOSIS — Z79899 Other long term (current) drug therapy: Secondary | ICD-10-CM | POA: Diagnosis not present

## 2017-08-19 NOTE — Progress Notes (Signed)
BP (!) 152/60 (BP Location: Left Arm, Patient Position: Sitting, Cuff Size: Large)   Pulse 89   Ht 5' 6" (1.676 m)   Wt 158 lb 9.6 oz (71.9 kg)   BMI 25.60 kg/m    Subjective:    Patient ID: Samuel Bryan., male    DOB: [birth year], 83 y.o.   MRN: [id number]  HPI: Samuel Bryan. is a 83 y.o. male  Chief Complaint  Patient presents with  . Diabetes    Pt states that he was aware he was diabetic, states sugar was good so was taken off meds   . Follow-up    HPI Patient is here with his family member He has type 2 diabetes mellitus based on the last set of labs Chart had type 2 diabetes, but all of the A1c readings were in the prediabetes range, so I had changed the diagnosis code to impaired fasting glucose; daughter says that Dr. Rutherford Nail diagnosed him years ago with diabetes and had him on metformin at one time; they had to stop that because of his kidneys Some one else cooks for him on the weekends; she uses salt substitute; he is on an ARB He'll try to avoid starches and grease He'll try to avoid sweets Mostly drinks water; no sweet tea; regular [employer] Eats brown bread Never met with diabetes educator years ago when diagnosed, apparently, and patient does not want to go No chest pain; he has a stress test coming up; he was seen at last visit and sent over to see Dr. Clayborn Bigness, cardiologist Miralax recommended (see the xray report) but he hasn't started that yet He also had arthritis found in his left shoulder and patient declined PT  Depression screen [facility] 2/9 08/19/2017 08/09/2017 02/06/2017 01/16/2017 12/04/2016  Decreased Interest 0 0 0 0 0  Down, Depressed, Hopeless 0 0 0 0 0  PHQ - 2 Score 0 0 0 0 0    Relevant past medical, surgical, family and social history reviewed Past Medical History:  Diagnosis Date  . Atherosclerosis of renal artery ([place])   . Essential hypertension, benign   . Occlusion and stenosis of carotid artery without mention of cerebral  infarction    moderate left ICA stenosis  . Peripheral vascular disease, unspecified ([place])    mild lifestyle limiting claudication  . Pure hypercholesterolemia   . Renal insufficiency   . Type 2 diabetes mellitus ([place])    Past Surgical History:  Procedure Laterality Date  . APPENDECTOMY  1970's  . COLONOSCOPY    . coronary artery stenting  1999   at [facility]   . FACIAL LACERATION REPAIR Left 12/13/2015   performed at [facility] by Dr. Marcial Pacas  . renal angiography     Family History  Problem Relation Age of Onset  . Diabetes Mother   . Hypertension Brother   . Hyperlipidemia Brother   . Diabetes Brother   . Hypertension Son   . Hypertension Son   . Birth defects Neg Hx   . Stroke Neg Hx    Social History   Social History  . Marital status: Widowed    Spouse name: N/A  . Number of children: N/A  . Years of education: N/A   Occupational History  . Not on file.   Social History Main Topics  . Smoking status: Former Smoker    Types: Cigarettes    Quit date: 10/29/1970  . Smokeless tobacco: Never Used  . Alcohol use No  . Drug use:  No  . Sexual activity: Not Currently   Other Topics Concern  . Not on file   Social History Narrative   Married, all children are healthy.    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     Interim medical history since last visit reviewed. Allergies and medications reviewed  Review of Systems Per HPI unless specifically indicated above     Objective:    BP (!) 152/60 (BP Location: Left Arm, Patient Position: Sitting, Cuff Size: Large)   Pulse 89   Ht 5' 6" (1.676 m)   Wt 158 lb 9.6 oz (71.9 kg)   BMI 25.60 kg/m   Wt Readings from Last 3 Encounters:  08/19/17 158 lb 9.6 oz (71.9 kg)  08/09/17 153 lb 9.6 oz (69.7 kg)  06/03/17 159 lb (72.1 kg)    Physical Exam  Constitutional: He appears well-developed and well-nourished. No distress.  Weight gain noted; does not appear  volume-overloaded  HENT:  Right Ear: Decreased hearing is noted.  Left Ear: Decreased hearing is noted.  Mouth/Throat: Mucous membranes are not dry.  Very hard of hearing  Eyes: No scleral icterus.  Cardiovascular: Normal rate and regular rhythm.   Pulmonary/Chest: Effort normal and breath sounds normal.  Neurological: He is alert.  Skin: No pallor.  Psychiatric: He has a normal mood and affect. His mood appears not anxious. He does not exhibit a depressed mood.   Diabetic Foot Form - Detailed   Diabetic Foot Exam - detailed Diabetic Foot exam was performed with the following findings:  Yes 08/19/2017 10:51 AM  Visual Foot Exam completed.:  Yes  Pulse Foot Exam completed.:  Yes  Right Dorsalis Pedis:  Present Left Dorsalis Pedis:  Present  Sensory Foot Exam Completed.:  Yes Semmes-Weinstein Monofilament Test R Site 1-Great Toe:  Pos L Site 1-Great Toe:  Pos        Results for orders placed or performed in visit on 08/09/17  Microalbumin / creatinine urine ratio  Result Value Ref Range   Creatinine, Urine 79 20 - 320 mg/dL   Microalb, Ur 4.0 mg/dL   Microalb Creat Ratio 51 (H) <30 mcg/mg creat  Lipid panel  Result Value Ref Range   Cholesterol 139 <200 mg/dL   HDL 59 >40 mg/dL   Triglycerides 49 <150 mg/dL   LDL Cholesterol (Calc) 66 mg/dL (calc)   Total CHOL/HDL Ratio 2.4 <5.0 (calc)   Non-HDL Cholesterol (Calc) 80 <130 mg/dL (calc)  Hemoglobin A1c  Result Value Ref Range   Hgb A1c MFr Bld 6.7 (H) <5.7 % of total Hgb   Mean Plasma Glucose 146 (calc)   eAG (mmol/L) 8.1 (calc)  COMPLETE METABOLIC PANEL WITH GFR  Result Value Ref Range   Glucose, Bld 125 (H) 65 - 99 mg/dL   BUN 26 (H) 7 - 25 mg/dL   Creat 1.63 (H) 0.70 - 1.11 mg/dL   GFR, Est Non African American 37 (L) > OR = 60 mL/min/1.8m   GFR, Est African American 43 (L) > OR = 60 mL/min/1.760m  BUN/Creatinine Ratio 16 6 - 22 (calc)   Sodium 140 135 - 146 mmol/L   Potassium 4.3 3.5 - 5.3 mmol/L   Chloride  102 98 - 110 mmol/L   CO2 30 20 - 32 mmol/L   Calcium 9.5 8.6 - 10.3 mg/dL   Total Protein 8.0 6.1 - 8.1 g/dL   Albumin 4.3 3.6 - 5.1 g/dL   Globulin 3.7  1.9 - 3.7 g/dL (calc)   AG Ratio 1.2 1.0 - 2.5 (calc)   Total Bilirubin 0.6 0.2 - 1.2 mg/dL   Alkaline phosphatase (APISO) 85 40 - 115 U/L   AST 18 10 - 35 U/L   ALT 11 9 - 46 U/L  TSH  Result Value Ref Range   TSH 1.76 0.40 - 4.50 mIU/L  T4, free  Result Value Ref Range   Free T4 1.2 0.8 - 1.8 ng/dL  CBC with Differential/Platelet  Result Value Ref Range   WBC 4.4 3.8 - 10.8 Thousand/uL   RBC 4.08 (L) 4.20 - 5.80 Million/uL   Hemoglobin 12.4 (L) 13.2 - 17.1 g/dL   HCT 38.3 (L) 38.5 - 50.0 %   MCV 93.9 80.0 - 100.0 fL   MCH 30.4 27.0 - 33.0 pg   MCHC 32.4 32.0 - 36.0 g/dL   RDW 12.3 11.0 - 15.0 %   Platelets 278 140 - 400 Thousand/uL   MPV 11.3 7.5 - 12.5 fL   Neutro Abs 2,820 1,500 - 7,800 cells/uL   Lymphs Abs 920 850 - 3,900 cells/uL   WBC mixed population 590 200 - 950 cells/uL   Eosinophils Absolute 62 15 - 500 cells/uL   Basophils Absolute 9 0 - 200 cells/uL   Neutrophils Relative % 64.1 %   Total Lymphocyte 20.9 %   Monocytes Relative 13.4 %   Eosinophils Relative 1.4 %   Basophils Relative 0.2 %      Assessment & Plan:   Problem List Items Addressed This Visit      Cardiovascular and Mediastinum   Coronary atherosclerosis (Chronic)    Patient now on Imdur per cardiologist, with stress test scheduled      Relevant Medications   isosorbide mononitrate (IMDUR) 30 MG 24 hr tablet     Endocrine   Type 2 diabetes mellitus ([place]) - Primary    I apologized to the patient and his daughter; apparently, he was diagnosed years ago by his previous provider with type 2 diabetes; the last few A1c readings reviewed were in the prediabetes range, so I changed the code to prediabetes; he declined the recommendation for diabetic education; we discussed healthy eating; foot exam by MD at visits        Musculoskeletal  and Integument   Degenerative arthritis of left shoulder region    Patient declined PT; reviewed xray report        Genitourinary   Chronic kidney disease, stage IV (severe) ([place])    Sees kidney doctor for this; cannot use metformin; will use tradjenta for management of diabetes        Other   On angiotensin receptor blockers (ARB)    Explained to aptient and daughter that he should not be consuming salt substitutes; his losartan can cause his kidneys to hold on to too much K+; daughter wrote this down and it's in the AVS as well      Microalbuminuria    Seeing kidney doctor; on ARB       Other Visit Diagnoses    Constipation, unspecified constipation type       Miralax recommended; also discussed adequate fiber intake and water intake      Follow up plan: No Follow-up on file.  An after-visit summary was printed and given to the patient at [place].  Please see the patient instructions which may contain other information and recommendations beyond what is mentioned above in the assessment and plan.  Meds ordered this encounter  Medications  . DISCONTD: isosorbide mononitrate (IMDUR) 30 MG 24 hr tablet    Sig: Take by mouth.  . isosorbide mononitrate (IMDUR) 30 MG 24 hr tablet    Sig: Take 1 tablet (30 mg total) by mouth daily.    No orders of the defined types were placed in this encounter.

## 2017-08-19 NOTE — Assessment & Plan Note (Signed)
I apologized to the patient and his daughter; apparently, he was diagnosed years ago by his previous provider with type 2 diabetes; the last few A1c readings reviewed were in the prediabetes range, so I changed the code to prediabetes; he declined the recommendation for diabetic education; we discussed healthy eating; foot exam by MD at visits

## 2017-08-19 NOTE — Assessment & Plan Note (Signed)
Patient now on Imdur per cardiologist, with stress test scheduled

## 2017-08-19 NOTE — Patient Instructions (Addendum)
Please do pick up linagliptin (Tradjenta) for the diabetes That was sent to [facility] on October 16th Pick up some Miralax and use that every day according to the package instructions Try to get plenty of water and fiber to help the bowels keep moving Try to reduce salt in your diet We'll have you see the diabetic educator Do not use a salt substitute (because of the losartan) Taking a salt substitute with the losartan can cause you to get too much potassium If you need to use a little bit of salt, use the real thing -- not the salt substitute It's better to flavor your food with pepper and herbs instead of salt (sodium)  Constipation, Adult Constipation is when a person:  Poops (has a bowel movement) fewer times in a week than normal.  Has a hard time pooping.  Has poop that is dry, hard, or bigger than normal.  Follow these instructions at home: Eating and drinking   Eat foods that have a lot of fiber, such as: ? Fresh fruits and vegetables. ? Whole grains. ? Beans.  Eat less of foods that are high in fat, low in fiber, or overly processed, such as: ? [place] fries. ? Hamburgers. ? Cookies. ? Candy. ? Soda.  Drink enough fluid to keep your pee (urine) clear or pale yellow. General instructions  Exercise regularly or as told by your doctor.  Go to the restroom when you feel like you need to poop. Do not hold it in.  Take over-the-counter and prescription medicines only as told by your doctor. These include any fiber supplements.  Do pelvic floor retraining exercises, such as: ? Doing deep breathing while relaxing your lower belly (abdomen). ? Relaxing your pelvic floor while pooping.  Watch your condition for any changes.  Keep all follow-up visits as told by your doctor. This is important. Contact a doctor if:  You have pain that gets worse.  You have a fever.  You have not pooped for 4 days.  You throw up (vomit).  You are not hungry.  You lose  weight.  You are bleeding from the anus.  You have thin, pencil-like poop (stool). Get help right away if:  You have a fever, and your symptoms suddenly get worse.  You leak poop or have blood in your poop.  Your belly feels hard or bigger than normal (is bloated).  You have very bad belly pain.  You feel dizzy or you faint. This information is not intended to replace advice given to you by your health care provider. Make sure you discuss any questions you have with your health care provider. Document Released: 04/02/2008 Document Revised: 05/04/2016 Document Reviewed: 04/04/2016 Elsevier Interactive Patient Education  2017 [place].  High-Fiber Diet Fiber, also called dietary fiber, is a type of carbohydrate found in fruits, vegetables, whole grains, and beans. A high-fiber diet can have many health benefits. Your health care provider may recommend a high-fiber diet to help:  Prevent constipation. Fiber can make your bowel movements more regular.  Lower your cholesterol.  Relieve hemorrhoids, uncomplicated diverticulosis, or irritable bowel syndrome.  Prevent overeating as part of a weight-loss plan.  Prevent heart disease, type 2 diabetes, and certain cancers.  What is my plan? The recommended daily intake of fiber includes:  38 grams for men under age 35.  76 grams for men over age 78.  73 grams for women under age 81.  80 grams for women over age 69.  You can  get the recommended daily intake of dietary fiber by eating a variety of fruits, vegetables, grains, and beans. Your health care provider may also recommend a fiber supplement if it is not possible to get enough fiber through your diet. What do I need to know about a high-fiber diet?  Fiber supplements have not been widely studied for their effectiveness, so it is better to get fiber through food sources.  Always check the fiber content on thenutrition facts label of any prepackaged food. Look for foods  that contain at least 5 grams of fiber per serving.  Ask your dietitian if you have questions about specific foods that are related to your condition, especially if those foods are not listed in the following section.  Increase your daily fiber consumption gradually. Increasing your intake of dietary fiber too quickly may cause bloating, cramping, or gas.  Drink plenty of water. Water helps you to digest fiber. What foods can I eat? Grains Whole-grain breads. Multigrain cereal. Oats and oatmeal. Brown rice. Barley. Bulgur wheat. [place]. Bran muffins. Popcorn. Rye wafer crackers. Vegetables Sweet potatoes. Spinach. Kale. Artichokes. Cabbage. Broccoli. Green peas. Carrots. Squash. Fruits Berries. Pears. Apples. Oranges. Avocados. Prunes and raisins. Dried figs. Meats and Other Protein Sources [place], kidney, pinto, and soy beans. Split peas. Lentils. Nuts and seeds. Dairy Fiber-fortified yogurt. Beverages Fiber-fortified soy milk. Fiber-fortified orange juice. Other Fiber bars. The items listed above may not be a complete list of recommended foods or beverages. Contact your dietitian for more options. What foods are not recommended? Grains White bread. Pasta made with refined flour. White rice. Vegetables Fried potatoes. Canned vegetables. Well-cooked vegetables. Fruits Fruit juice. Cooked, strained fruit. Meats and Other Protein Sources Fatty cuts of meat. Fried Sales executive or fried fish. Dairy Milk. Yogurt. Cream cheese. Sour cream. Beverages Soft drinks. Other Cakes and pastries. Butter and oils. The items listed above may not be a complete list of foods and beverages to avoid. Contact your dietitian for more information. What are some tips for including high-fiber foods in my diet?  Eat a wide variety of high-fiber foods.  Make sure that half of all grains consumed each day are whole grains.  Replace breads and cereals made from refined flour or white flour with whole-grain  breads and cereals.  Replace white rice with brown rice, bulgur wheat, or millet.  Start the day with a breakfast that is high in fiber, such as a cereal that contains at least 5 grams of fiber per serving.  Use beans in place of meat in soups, salads, or pasta.  Eat high-fiber snacks, such as berries, raw vegetables, nuts, or popcorn. This information is not intended to replace advice given to you by your health care provider. Make sure you discuss any questions you have with your health care provider. Document Released: 10/15/2005 Document Revised: 03/22/2016 Document Reviewed: 03/30/2014 Elsevier Interactive Patient Education  2017 [employer].

## 2017-08-19 NOTE — Assessment & Plan Note (Signed)
Seeing kidney doctor; on ARB

## 2017-08-19 NOTE — Assessment & Plan Note (Addendum)
Sees kidney doctor for this; cannot use metformin; will use tradjenta for management of diabetes

## 2017-08-19 NOTE — Assessment & Plan Note (Signed)
Explained to aptient and daughter that he should not be consuming salt substitutes; his losartan can cause his kidneys to hold on to too much K+; daughter wrote this down and it's in the AVS as well

## 2017-08-19 NOTE — Assessment & Plan Note (Signed)
Patient declined PT; reviewed xray report

## 2017-09-23 ENCOUNTER — Ambulatory Visit
Admission: RE | Admit: 2017-09-23 | Discharge: 2017-09-23 | Disposition: A | Discharge status: Home / Self Care | Payer: Medicare Other | Source: Ambulatory Visit | Attending: Family Medicine | Admitting: Family Medicine

## 2017-09-23 ENCOUNTER — Ambulatory Visit: Payer: Medicare Other | Admitting: Family Medicine

## 2017-09-23 ENCOUNTER — Encounter: Payer: Self-pay | Admitting: Family Medicine

## 2017-09-23 VITALS — BP 124/72 | HR 82 | Temp 98.0°F | Resp 16 | Wt 151.8 lb

## 2017-09-23 DIAGNOSIS — J40 Bronchitis, not specified as acute or chronic: Secondary | ICD-10-CM | POA: Diagnosis not present

## 2017-09-23 DIAGNOSIS — R05 Cough: Secondary | ICD-10-CM

## 2017-09-23 DIAGNOSIS — J189 Pneumonia, unspecified organism: Secondary | ICD-10-CM | POA: Diagnosis not present

## 2017-09-23 DIAGNOSIS — R058 Other specified cough: Secondary | ICD-10-CM

## 2017-09-23 DIAGNOSIS — R040 Epistaxis: Secondary | ICD-10-CM

## 2017-09-23 MED ORDER — SALINE SPRAY 0.65 % NA SOLN: 1.0000 | NASAL | 2 refills | Status: DC | PRN

## 2017-09-23 MED ORDER — BENZONATATE 100 MG PO CAPS: 100.0000 mg | ORAL_CAPSULE | Freq: Three times a day (TID) | ORAL | 0 refills | Status: DC | PRN

## 2017-09-23 MED ORDER — OXYMETAZOLINE HCL 0.05 % NA SOLN: 1.0000 | Freq: Two times a day (BID) | NASAL | 0 refills | Status: AC | PRN

## 2017-09-23 MED ORDER — AZITHROMYCIN 250 MG PO TABS: ORAL_TABLET | ORAL | 0 refills | Status: DC

## 2017-09-23 NOTE — Patient Instructions (Addendum)
Please use Afrin twice daily for 2 days, then STOP.  Please use cool mist vaporizer. Please use Ocean (Saline) nasal spray throughout the day as needed to keep your nose moist. Please apply vaseline at night to both nostrils to keep area moist.  Cool Mist Vaporizer A cool mist vaporizer is a device that releases a cool mist into the air. If you have a cough or a cold, using a vaporizer may help relieve your symptoms. The mist adds moisture to the air, which may help thin your mucus and make it less sticky. When your mucus is thin and less sticky, it easier for you to breathe and to cough up secretions. Do not use a vaporizer if you are allergic to mold. Follow these instructions at home:  Follow the instructions that come with the vaporizer.  Do not use anything other than distilled water in the vaporizer.  Do not run the vaporizer all of the time. Doing that can cause mold or bacteria to grow in the vaporizer.  Clean the vaporizer after each time that you use it.  Clean and dry the vaporizer well before storing it.  Stop using the vaporizer if your breathing symptoms get worse. This information is not intended to replace advice given to you by your health care provider. Make sure you discuss any questions you have with your health care provider. Document Released: 07/12/2004 Document Revised: 05/04/2016 Document Reviewed: 01/14/2016 Elsevier Interactive Patient Education  [employer].
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
## 2017-09-24 ENCOUNTER — Telehealth: Payer: Self-pay

## 2017-09-24 NOTE — Telephone Encounter (Signed)
-----   Message from Hubbard Hartshorn, [place] sent at 09/23/2017  5:01 PM EST ----- Negative chest Xray, please take antibiotic regardless due to high risk of developing pneumonia. Please schedule a 2 week follow up with Dr. Sanda Klein. Thanks!

## 2017-09-24 NOTE — Telephone Encounter (Signed)
I tried to contact this patient to review the results from his most recent chest x-ray, but there was no answer. A message was left for the patient with Emily's comments on their and he was advise to contact our office so that he could get scheduled for a 2 week f/u.

## 2017-11-25 ENCOUNTER — Ambulatory Visit: Payer: Self-pay | Admitting: Family Medicine

## 2017-11-25 ENCOUNTER — Ambulatory Visit (INDEPENDENT_AMBULATORY_CARE_PROVIDER_SITE_OTHER): Payer: Medicare Other | Admitting: Vascular Surgery

## 2017-11-28 ENCOUNTER — Encounter: Payer: Self-pay | Admitting: Family Medicine

## 2017-11-28 ENCOUNTER — Ambulatory Visit: Payer: Medicare Other | Admitting: Family Medicine

## 2017-11-28 ENCOUNTER — Ambulatory Visit (INDEPENDENT_AMBULATORY_CARE_PROVIDER_SITE_OTHER): Payer: Medicare Other | Admitting: Vascular Surgery

## 2017-11-28 ENCOUNTER — Encounter (INDEPENDENT_AMBULATORY_CARE_PROVIDER_SITE_OTHER): Payer: Self-pay | Admitting: Vascular Surgery

## 2017-11-28 VITALS — BP 149/69 | HR 75 | Resp 14 | Ht 69.0 in | Wt 154.0 lb

## 2017-11-28 DIAGNOSIS — I779 Disorder of arteries and arterioles, unspecified: Secondary | ICD-10-CM

## 2017-11-28 DIAGNOSIS — M19012 Primary osteoarthritis, left shoulder: Secondary | ICD-10-CM | POA: Diagnosis not present

## 2017-11-28 DIAGNOSIS — N184 Chronic kidney disease, stage 4 (severe): Secondary | ICD-10-CM

## 2017-11-28 DIAGNOSIS — N183 Chronic kidney disease, stage 3 (moderate): Secondary | ICD-10-CM | POA: Diagnosis not present

## 2017-11-28 DIAGNOSIS — I739 Peripheral vascular disease, unspecified: Secondary | ICD-10-CM

## 2017-11-28 DIAGNOSIS — E1122 Type 2 diabetes mellitus with diabetic chronic kidney disease: Secondary | ICD-10-CM

## 2017-11-28 DIAGNOSIS — I2510 Atherosclerotic heart disease of native coronary artery without angina pectoris: Secondary | ICD-10-CM | POA: Diagnosis not present

## 2017-11-28 DIAGNOSIS — I10 Essential (primary) hypertension: Secondary | ICD-10-CM

## 2017-11-28 DIAGNOSIS — E782 Mixed hyperlipidemia: Secondary | ICD-10-CM

## 2017-11-28 MED ORDER — AMLODIPINE BESYLATE 5 MG PO TABS: 5.0000 mg | ORAL_TABLET | Freq: Every day | ORAL | 3 refills | Status: DC

## 2017-11-28 MED ORDER — ATORVASTATIN CALCIUM 40 MG PO TABS: 40.0000 mg | ORAL_TABLET | Freq: Every day | ORAL | 3 refills | Status: DC

## 2017-11-28 MED ORDER — LINAGLIPTIN 5 MG PO TABS: 5.0000 mg | ORAL_TABLET | Freq: Every day | ORAL | 3 refills | Status: DC

## 2017-11-28 NOTE — Patient Instructions (Addendum)
Cancel Feb 7th appt Schedule visit on or after February 10, 2018

## 2017-11-28 NOTE — Progress Notes (Signed)
BP 122/70   Pulse 84   Temp (!) 97.4 F (36.3 C) (Oral)   Resp 16   Wt 153 lb 12.8 oz (69.8 kg)   SpO2 98%   BMI 22.71 kg/m    Subjective:    Patient ID: Samuel Lope., male    DOB: Sep 29, 1927, 84 y.o.   MRN: [id number]  HPI: Samuel Minasyan. is a 84 y.o. male  Chief Complaint  Patient presents with  . Follow-up   HPI Patient was seen in November for cough diagnosed with bronchitis; had chest xray done No more cough No fever Energy is good Chest xray 09/23/17: CLINICAL DATA:  Pt states productive cough for 2 weeks, no other symptoms. Coronary artery stenting in 1999. shielded  EXAM: CHEST  2 VIEW  COMPARISON:  08/09/2017  FINDINGS: Heart size is normal. Lungs are free of focal consolidations and pleural effusions. There is minimal atelectasis or scarring at the left lung base. Numerous remote rib fractures.  IMPRESSION: No active cardiopulmonary disease.   Electronically Signed   By: Nolon Nations M.D.   On: 09/23/2017 16:17  Left shoulder arthritis; limited ROM  Type 2 diabetes No problems with feet; not checking sugars Dry mouth in the morning; no blurred vision Lab Results  Component Value Date   HGBA1C 6.7 (H) 08/09/2017  Sees nephrologist  He is going to have carotid endarterectomy soon, Dr. Ronalee Belts  Depression screen [facility] 2/9 11/28/2017 08/19/2017 08/09/2017 02/06/2017 01/16/2017  Decreased Interest 0 0 0 0 0  Down, Depressed, Hopeless 0 0 0 0 0  PHQ - 2 Score 0 0 0 0 0    Relevant past medical, surgical, family and social history reviewed Past Medical History:  Diagnosis Date  . Atherosclerosis of renal artery ([place])   . Essential hypertension, benign   . Occlusion and stenosis of carotid artery without mention of cerebral infarction    moderate left ICA stenosis  . Peripheral vascular disease, unspecified ([place])    mild lifestyle limiting claudication  . Pure hypercholesterolemia   . Renal insufficiency   . Type 2 diabetes  mellitus ([place])    Past Surgical History:  Procedure Laterality Date  . APPENDECTOMY  1970's  . COLONOSCOPY    . coronary artery stenting  1999   at [facility]   . FACIAL LACERATION REPAIR Left 12/13/2015   performed at [facility] by Dr. Marcial Pacas  . renal angiography     Family History  Problem Relation Age of Onset  . Diabetes Mother   . Hypertension Brother   . Hyperlipidemia Brother   . Diabetes Brother   . Hypertension Son   . Hypertension Son   . Birth defects Neg Hx   . Stroke Neg Hx    Social History   Tobacco Use  . Smoking status: Former Smoker    Types: Cigarettes    Last attempt to quit: 10/29/1970    Years since quitting: 47.1  . Smokeless tobacco: Never Used  Substance Use Topics  . Alcohol use: No    Alcohol/week: 0.0 oz  . Drug use: No    Interim medical history since last visit reviewed. Allergies and medications reviewed  Review of Systems Per HPI unless specifically indicated above     Objective:    BP 122/70   Pulse 84   Temp (!) 97.4 F (36.3 C) (Oral)   Resp 16   Wt 153 lb 12.8 oz (69.8 kg)   SpO2 98%   BMI 22.71 kg/m  Wt Readings from Last 3 Encounters:  11/28/17 153 lb 12.8 oz (69.8 kg)  11/28/17 154 lb (69.9 kg)  09/23/17 151 lb 12.8 oz (68.9 kg)    Physical Exam  Constitutional: He appears well-developed and well-nourished. No distress.  HENT:  Right Ear: Decreased hearing is noted.  Left Ear: Decreased hearing is noted.  Eyes: No scleral icterus.  Cardiovascular: Normal rate and regular rhythm.  Pulmonary/Chest: Effort normal and breath sounds normal.  Musculoskeletal:       Left shoulder: He exhibits decreased range of motion.  Neurological: He is alert.  Skin: No pallor.  Psychiatric: He has a normal mood and affect.   Diabetic Foot Form - Detailed   Diabetic Foot Exam - detailed Diabetic Foot exam was performed with the following findings:  Yes 11/28/2017  1:52 PM  Visual Foot Exam completed.:  Yes  Pulse Foot Exam  completed.:  Yes  Right Dorsalis Pedis:  Present Left Dorsalis Pedis:  Present  Sensory Foot Exam Completed.:  Yes Semmes-Weinstein Monofilament Test R Site 1-Great Toe:  Pos L Site 1-Great Toe:  Pos           Assessment & Plan:   Problem List Items Addressed This Visit      Cardiovascular and Mediastinum   Carotid arterial disease ([place]) (Chronic)    Planning for carotid endarterectomy soon      Relevant Medications   atorvastatin (LIPITOR) 40 MG tablet   amLODipine (NORVASC) 5 MG tablet     Endocrine   Type 2 diabetes mellitus ([place])    Foot exam by MD today; next A1c due in Mid-April      Relevant Medications   atorvastatin (LIPITOR) 40 MG tablet   linagliptin (TRADJENTA) 5 MG TABS tablet     Musculoskeletal and Integument   Degenerative arthritis of left shoulder region    With limited ROM; no NSAIDs        Genitourinary   Chronic kidney disease, stage IV (severe) ([place])    Patient is seeing nephrologist; avoid NSAIDs          Follow up plan: Return in about 2 months (around 02/10/2018) for follow-up visit with Dr. Sanda Klein.  An after-visit summary was printed and given to the patient at [facility].  Please see the patient instructions which may contain other information and recommendations beyond what is mentioned above in the assessment and plan.  Meds ordered this encounter  Medications  . atorvastatin (LIPITOR) 40 MG tablet    Sig: Take 1 tablet (40 mg total) by mouth at bedtime.    Dispense:  90 tablet    Refill:  3  . amLODipine (NORVASC) 5 MG tablet    Sig: Take 1 tablet (5 mg total) by mouth daily.    Dispense:  90 tablet    Refill:  3  . linagliptin (TRADJENTA) 5 MG TABS tablet    Sig: Take 1 tablet (5 mg total) by mouth daily. For diabetes    Dispense:  90 tablet    Refill:  3    No orders of the defined types were placed in this encounter.

## 2017-11-28 NOTE — Assessment & Plan Note (Signed)
Patient is seeing nephrologist; avoid NSAIDs

## 2017-11-28 NOTE — Assessment & Plan Note (Signed)
Planning for carotid endarterectomy soon

## 2017-11-28 NOTE — Assessment & Plan Note (Signed)
With limited ROM; no NSAIDs

## 2017-11-28 NOTE — Assessment & Plan Note (Signed)
Foot exam by MD today; next A1c due in Mid-April

## 2017-12-01 ENCOUNTER — Encounter (INDEPENDENT_AMBULATORY_CARE_PROVIDER_SITE_OTHER): Payer: Self-pay | Admitting: Vascular Surgery

## 2017-12-01 NOTE — Progress Notes (Signed)
MRN : [id number]  Samuel Bryan. is a 84 y.o. (November 21, 1926) male who presents with chief complaint of  Chief Complaint  Patient presents with  . Follow-up    CT results from 05/2017  .  History of Present Illness:   The patient is seen for follow up evaluation of carotid stenosis status post CT angiogram. CT scan was done. Patient reports that the test went well with no problems or complications.   The patient denies interval amaurosis fugax. There is no recent or interval TIA symptoms or focal motor deficits. There is no prior documented CVA.  The patient is taking enteric-coated aspirin 81 mg daily.  There is no history of migraine headaches. There is no history of seizures.  The patient has a history of coronary artery disease, no recent episodes of angina or shortness of breath. The patient denies PAD or claudication symptoms. There is a history of hyperlipidemia which is being treated with a statin.    CT angiogram is reviewed by me personally and shows 90% densely calcified stenosis consistent with calcified plaque at the origin of the left internal carotid artery.    No outpatient medications have been marked as taking for the 11/28/17 encounter (Office Visit) with Delana Meyer, Dolores Lory, MD.    Past Medical History:  Diagnosis Date  . Atherosclerosis of renal artery ([place])   . Essential hypertension, benign   . Occlusion and stenosis of carotid artery without mention of cerebral infarction    moderate left ICA stenosis  . Peripheral vascular disease, unspecified ([place])    mild lifestyle limiting claudication  . Pure hypercholesterolemia   . Renal insufficiency   . Type 2 diabetes mellitus ([place])     Past Surgical History:  Procedure Laterality Date  . APPENDECTOMY  1970's  . COLONOSCOPY    . coronary artery stenting  1999   at [facility]   . FACIAL LACERATION REPAIR Left 12/13/2015   performed at [facility] by Dr. Marcial Pacas  . renal angiography      Social  History Social History   Tobacco Use  . Smoking status: Former Smoker    Types: Cigarettes    Last attempt to quit: 10/29/1970    Years since quitting: 47.1  . Smokeless tobacco: Never Used  Substance Use Topics  . Alcohol use: No    Alcohol/week: 0.0 oz  . Drug use: No    Family History Family History  Problem Relation Age of Onset  . Diabetes Mother   . Hypertension Brother   . Hyperlipidemia Brother   . Diabetes Brother   . Hypertension Son   . Hypertension Son   . Birth defects Neg Hx   . Stroke Neg Hx     No Known Allergies   REVIEW OF SYSTEMS (Negative unless checked)  Constitutional: [] Weight loss  [] Fever  [] Chills Cardiac: [] Chest pain   [] Chest pressure   [] Palpitations   [] Shortness of breath when laying flat   [] Shortness of breath with exertion. Vascular:  [x] Pain in legs with walking   [] Pain in legs at rest  [] History of DVT   [] Phlebitis   [] Swelling in legs   [] Varicose veins   [] Non-healing ulcers Pulmonary:   [] Uses home oxygen   [] Productive cough   [] Hemoptysis   [] Wheeze  [] COPD   [] Asthma Neurologic:  [x] Dizziness   [] Seizures   [] History of stroke   [x] History of TIA  [] Aphasia   [] Vissual changes   [] Weakness or numbness in arm   []   Weakness or numbness in leg Musculoskeletal:   [] Joint swelling   [] Joint pain   [] Low back pain Hematologic:  [] Easy bruising  [] Easy bleeding   [] Hypercoagulable state   [] Anemic Gastrointestinal:  [] Diarrhea   [] Vomiting  [] Gastroesophageal reflux/heartburn   [] Difficulty swallowing. Genitourinary:  [] Chronic kidney disease   [] Difficult urination  [] Frequent urination   [] Blood in urine Skin:  [] Rashes   [] Ulcers  Psychological:  [] History of anxiety   []  History of major depression.  Physical Examination  Vitals:   11/28/17 1146  BP: (!) 149/69  Pulse: 75  Resp: 14  Weight: 154 lb (69.9 kg)  Height: 5\' 9"  (1.753 m)   Body mass index is 22.74 kg/m. Gen: WD/WN, NAD Head: [place]/AT, No temporalis wasting.   Ear/Nose/Throat: Hearing grossly intact, nares w/o erythema or drainage Eyes: PER, EOMI, sclera nonicteric.  Neck: Supple, no large masses.   Pulmonary:  Good air movement, no audible wheezing bilaterally, no use of accessory muscles.  Cardiac: RRR, no JVD Vascular: bilateral carotid bruit Vessel Right Left  Radial Palpable Palpable  Ulnar Palpable Palpable  Brachial Palpable Palpable  Carotid Palpable Palpable  Gastrointestinal: Non-distended. No guarding/no peritoneal signs.  Musculoskeletal: M/S 5/5 throughout.  No deformity or atrophy.  Neurologic: CN 2-12 intact. Symmetrical.  Speech is fluent. Motor exam as listed above. Psychiatric: Judgment intact, Mood & affect appropriate for pt's clinical situation. Dermatologic: No rashes or ulcers noted.  No changes consistent with cellulitis. Lymph : No lichenification or skin changes of chronic lymphedema.  CBC Lab Results  Component Value Date   WBC 4.4 08/09/2017   HGB 12.4 (L) 08/09/2017   HCT 38.3 (L) 08/09/2017   MCV 93.9 08/09/2017   PLT 278 08/09/2017    BMET    Component Value Date/Time   NA 140 08/09/2017 1202   NA 137 04/09/2016 1008   NA 137 04/26/2012 1145   K 4.3 08/09/2017 1202   K 4.0 04/26/2012 1145   CL 102 08/09/2017 1202   CL 104 04/26/2012 1145   CO2 30 08/09/2017 1202   CO2 29 04/26/2012 1145   GLUCOSE 125 (H) 08/09/2017 1202   GLUCOSE 105 (H) 04/26/2012 1145   BUN 26 (H) 08/09/2017 1202   BUN 19 04/09/2016 1008   BUN 16 04/26/2012 1145   CREATININE 1.63 (H) 08/09/2017 1202   CALCIUM 9.5 08/09/2017 1202   CALCIUM 8.9 04/26/2012 1145   GFRNONAA 37 (L) 08/09/2017 1202   GFRAA 43 (L) 08/09/2017 1202   CrCl cannot be calculated (Patient's most recent lab result is older than the maximum 21 days allowed.).  COAG No results found for: INR, PROTIME  Radiology No results found.  Assessment/Plan 1. Bilateral carotid artery disease, unspecified type ([place]) Recommend:  The patient remains  asymptomatic with respect to the carotid stenosis.  However, the patient has now progressed and has a lesion the is >75%.  Patient's CT angiography of the carotid arteries confirms >75% left ICA stenosis.  The anatomical considerations support surgery over stenting.  This was discussed in detail with the patient.  The patient does indeed need surgery, therefore, cardiac clearance will be arranged. Once cleared the patient will be scheduled for surgery.  The risks, benefits and alternative therapies were reviewed in detail with the patient.  All questions were answered.  The patient agrees to proceed with surgery of the left carotid artery.  Continue antiplatelet therapy as prescribed. Continue management of CAD, HTN and Hyperlipidemia. Healthy heart diet, encouraged exercise at least 4 times  per week.  - Ambulatory referral to Cardiology  2. Arteriosclerosis of coronary artery Continue cardiac and antihypertensive medications as already ordered and reviewed, no changes at this time.  I will send a referral to Dr Clayborn Bigness for preop clearance  Continue statin as ordered and reviewed, no changes at this time  Nitrates PRN for chest pain  - Ambulatory referral to Cardiology  3. Essential hypertension Continue antihypertensive medications as already ordered, these medications have been reviewed and there are no changes at this time.   4. Peripheral vascular disease ([place])  Recommend:  The patient has evidence of atherosclerosis of the lower extremities with claudication.  The patient does not voice lifestyle limiting changes at this point in time.  Noninvasive studies do not suggest clinically significant change.  No invasive studies, angiography or surgery at this time The patient should continue walking and begin a more formal exercise program.  The patient should continue antiplatelet therapy and aggressive treatment of the lipid abnormalities  No changes in the patient's  medications at this time  The patient should continue wearing graduated compression socks 10-15 mmHg strength to control the mild edema.    5. Type 2 diabetes mellitus with stage 3 chronic kidney disease, without long-term current use of insulin (HCC) Continue hypoglycemic medications as already ordered, these medications have been reviewed and there are no changes at this time.  Hgb A1C to be monitored as already arranged by primary service   6. Mixed hyperlipidemia Continue statin as ordered and reviewed, no changes at this time     Hortencia Pilar, MD  12/01/2017 10:40 AM

## 2017-12-03 ENCOUNTER — Encounter (INDEPENDENT_AMBULATORY_CARE_PROVIDER_SITE_OTHER): Payer: Self-pay

## 2017-12-05 ENCOUNTER — Encounter: Payer: Medicare Other | Admitting: Family Medicine

## 2017-12-10 ENCOUNTER — Other Ambulatory Visit (INDEPENDENT_AMBULATORY_CARE_PROVIDER_SITE_OTHER): Payer: Self-pay

## 2017-12-10 ENCOUNTER — Encounter
Admission: RE | Admit: 2017-12-10 | Discharge: 2017-12-10 | Disposition: A | Discharge status: Home / Self Care | Payer: Medicare Other | Source: Ambulatory Visit | Attending: Vascular Surgery | Admitting: Vascular Surgery

## 2017-12-10 ENCOUNTER — Other Ambulatory Visit: Payer: Self-pay

## 2017-12-10 DIAGNOSIS — Z7901 Long term (current) use of anticoagulants: Secondary | ICD-10-CM | POA: Insufficient documentation

## 2017-12-10 DIAGNOSIS — Z01818 Encounter for other preprocedural examination: Secondary | ICD-10-CM | POA: Insufficient documentation

## 2017-12-10 LAB — COMPREHENSIVE METABOLIC PANEL
ALBUMIN: 4.2 g/dL (ref 3.5–5.0)
ALK PHOS: 68 U/L (ref 38–126)
ALT: 11 U/L — ABNORMAL LOW (ref 17–63)
AST: 20 U/L (ref 15–41)
Anion gap: 7 (ref 5–15)
BILIRUBIN TOTAL: 0.7 mg/dL (ref 0.3–1.2)
BUN: 30 mg/dL — AB (ref 6–20)
CALCIUM: 9.6 mg/dL (ref 8.9–10.3)
CO2: 25 mmol/L (ref 22–32)
Chloride: 108 mmol/L (ref 101–111)
Creatinine, Ser: 1.69 mg/dL — ABNORMAL HIGH (ref 0.61–1.24)
GFR calc Af Amer: 40 mL/min — ABNORMAL LOW (ref >60)
GFR calc non Af Amer: 34 mL/min — ABNORMAL LOW (ref >60)
GLUCOSE: 116 mg/dL — AB (ref 65–99)
POTASSIUM: 4.1 mmol/L (ref 3.5–5.1)
SODIUM: 140 mmol/L (ref 135–145)
TOTAL PROTEIN: 8 g/dL (ref 6.5–8.1)

## 2017-12-10 LAB — SURGICAL PCR SCREEN
MRSA, PCR: NEGATIVE
STAPHYLOCOCCUS AUREUS: NEGATIVE

## 2017-12-10 LAB — PROTIME-INR
INR: 1.06
Prothrombin Time: 13.7 seconds (ref 11.4–15.2)

## 2017-12-10 LAB — TYPE AND SCREEN
ABO/RH(D): B POS
ANTIBODY SCREEN: NEGATIVE

## 2017-12-10 LAB — APTT: aPTT: 28 seconds (ref 24–36)

## 2017-12-10 NOTE — Patient Instructions (Signed)
Your procedure is scheduled on: Wed. 12/18/17 Report to Day Surgery. To find out your arrival time please call [phone] between 1PM - 3PM on Tues. 12/17/17.  Remember: Instructions that are not followed completely may result in serious medical risk, up to and including death, or upon the discretion of your surgeon and anesthesiologist your surgery may need to be rescheduled.     _X__ 1. Do not eat food after midnight the night before your procedure.                 No gum chewing or hard candies. You may drink clear liquids up to 2 hours                 before you are scheduled to arrive for your surgery- DO not drink clear                 liquids within 2 hours of the start of your surgery.                 Clear Liquids include:  water, apple juice without pulp, clear carbohydrate                 drink such as Clearfast of Gartorade, Black Coffee or Tea (Do not add                 anything to coffee or tea).  __X__2.  On the morning of surgery brush your teeth with toothpaste and water, you may rinse your mouth with mouthwash if you wish.  Do not swallow any              toothpaste of mouthwash.     _X__ 3.  No Alcohol for 24 hours before or after surgery.   _X__ 4.  Do Not Smoke or use e-cigarettes For 24 Hours Prior to Your Surgery.                 Do not use any chewable tobacco products for at least 6 hours prior to                 surgery.  ____  5.  Bring all medications with you on the day of surgery if instructed.   __x__  6.  Notify your doctor if there is any change in your medical condition      (cold, fever, infections).     Do not wear jewelry, make-up, hairpins, clips or nail polish. Do not wear lotions, powders, or perfumes. You may wear deodorant. Do not shave 48 hours prior to surgery. Men may shave face and neck. Do not bring valuables to the hospital.    [facility] is not responsible for any belongings or valuables.  Contacts, dentures  or bridgework may not be worn into surgery. Leave your suitcase in the car. After surgery it may be brought to your room. For patients admitted to the hospital, discharge time is determined by your treatment team.   Patients discharged the day of surgery will not be allowed to drive home.   Please read over the following fact sheets that you were given:     _x___ Take these medicines the morning of surgery with A SIP OF WATER:    1. amLODipine (NORVASC) 5 MG tablet  2.isosorbide mononitrate (IMDUR) 30 MG 24 hr tablet   3.   4.  5.  6.  ____ Fleet Enema (as directed)   __x__ Use CHG Soap as directed  ____ Use inhalers on the day of surgery  ____ Stop metformin 2 days prior to surgery    ____ Take 1/2 of usual insulin dose the night before surgery. No insulin the morning          of surgery.   ____ Stop Coumadin/Plavix/aspirin on   ____ Stop Anti-inflammatories on    __x__ Stop supplements until after surgery.  Omega-3 Fatty Acids (FISH OIL) 1000 MG CAPS  ____ Bring C-Pap to the hospital.

## 2017-12-17 MED ORDER — CEFAZOLIN SODIUM-DEXTROSE 1-4 GM/50ML-% IV SOLN
1.0000 g | Freq: Once | INTRAVENOUS | Status: AC
  Administered 2017-12-18: 1 g via INTRAVENOUS

## 2017-12-18 ENCOUNTER — Other Ambulatory Visit: Payer: Self-pay

## 2017-12-18 ENCOUNTER — Encounter
Admission: RE | Disposition: A | Discharge status: Home / Self Care | Payer: Self-pay | Source: Ambulatory Visit | Attending: Vascular Surgery

## 2017-12-18 ENCOUNTER — Inpatient Hospital Stay
Admission: RE | Admit: 2017-12-18 | Discharge: 2017-12-19 | DRG: 039 | Disposition: A | Discharge status: Home / Self Care | Payer: Medicare Other | Source: Ambulatory Visit | Attending: Vascular Surgery | Admitting: Vascular Surgery

## 2017-12-18 ENCOUNTER — Inpatient Hospital Stay: Payer: Medicare Other | Admitting: Anesthesiology

## 2017-12-18 DIAGNOSIS — Z79899 Other long term (current) drug therapy: Secondary | ICD-10-CM

## 2017-12-18 DIAGNOSIS — E119 Type 2 diabetes mellitus without complications: Secondary | ICD-10-CM | POA: Diagnosis present

## 2017-12-18 DIAGNOSIS — I6522 Occlusion and stenosis of left carotid artery: Secondary | ICD-10-CM | POA: Diagnosis present

## 2017-12-18 DIAGNOSIS — E7800 Pure hypercholesterolemia, unspecified: Secondary | ICD-10-CM | POA: Diagnosis present

## 2017-12-18 DIAGNOSIS — Z87891 Personal history of nicotine dependence: Secondary | ICD-10-CM

## 2017-12-18 DIAGNOSIS — I2510 Atherosclerotic heart disease of native coronary artery without angina pectoris: Secondary | ICD-10-CM | POA: Diagnosis present

## 2017-12-18 DIAGNOSIS — D649 Anemia, unspecified: Secondary | ICD-10-CM | POA: Diagnosis present

## 2017-12-18 DIAGNOSIS — I739 Peripheral vascular disease, unspecified: Secondary | ICD-10-CM | POA: Diagnosis present

## 2017-12-18 DIAGNOSIS — I10 Essential (primary) hypertension: Secondary | ICD-10-CM | POA: Diagnosis present

## 2017-12-18 HISTORY — PX: ENDARTERECTOMY: SHX5162

## 2017-12-18 LAB — ABO/RH: ABO/RH(D): B POS

## 2017-12-18 LAB — GLUCOSE, CAPILLARY
GLUCOSE-CAPILLARY: 110 mg/dL — AB (ref 65–99)
GLUCOSE-CAPILLARY: 109 mg/dL — AB (ref 65–99)
Glucose-Capillary: 117 mg/dL — ABNORMAL HIGH (ref 65–99)
Glucose-Capillary: 132 mg/dL — ABNORMAL HIGH (ref 65–99)
Glucose-Capillary: 129 mg/dL — ABNORMAL HIGH (ref 65–99)

## 2017-12-18 LAB — MRSA PCR SCREENING: MRSA by PCR: NEGATIVE

## 2017-12-18 SURGERY — ENDARTERECTOMY, CAROTID
Anesthesia: General | Laterality: Left | Wound class: Clean

## 2017-12-18 MED ORDER — METOPROLOL TARTRATE 5 MG/5ML IV SOLN: 2.0000 mg | INTRAVENOUS | Status: DC | PRN

## 2017-12-18 MED ORDER — REMIFENTANIL HCL 1 MG IV SOLR
INTRAVENOUS | Status: DC | PRN
  Administered 2017-12-18: 50 ug via INTRAVENOUS

## 2017-12-18 MED ORDER — ROCURONIUM BROMIDE 50 MG/5ML IV SOLN
INTRAVENOUS | Status: AC
  Filled 2017-12-18: qty 1

## 2017-12-18 MED ORDER — HEPARIN SODIUM (PORCINE) 5000 UNIT/ML IJ SOLN
INTRAMUSCULAR | Status: AC
Start: 2017-12-18 — End: ?
  Filled 2017-12-18: qty 1

## 2017-12-18 MED ORDER — FAMOTIDINE 20 MG PO TABS
ORAL_TABLET | ORAL | Status: AC
  Administered 2017-12-18: 20 mg via ORAL
  Filled 2017-12-18: qty 1

## 2017-12-18 MED ORDER — EVICEL 2 ML EX KIT
PACK | CUTANEOUS | Status: AC
  Filled 2017-12-18: qty 1

## 2017-12-18 MED ORDER — SUGAMMADEX SODIUM 200 MG/2ML IV SOLN
INTRAVENOUS | Status: DC | PRN
  Administered 2017-12-18: 140 mg via INTRAVENOUS

## 2017-12-18 MED ORDER — ATORVASTATIN CALCIUM 20 MG PO TABS
40.0000 mg | ORAL_TABLET | Freq: Every day | ORAL | Status: DC
  Administered 2017-12-18: 40 mg via ORAL
  Filled 2017-12-18: qty 1
  Filled 2017-12-18: qty 2

## 2017-12-18 MED ORDER — REMIFENTANIL HCL 1 MG IV SOLR
INTRAVENOUS | Status: AC
  Filled 2017-12-18: qty 1000

## 2017-12-18 MED ORDER — VASOPRESSIN 20 UNIT/ML IV SOLN
INTRAVENOUS | Status: AC
  Filled 2017-12-18: qty 1

## 2017-12-18 MED ORDER — PANTOPRAZOLE SODIUM 40 MG IV SOLR
40.0000 mg | Freq: Every day | INTRAVENOUS | Status: DC
  Administered 2017-12-18: 40 mg via INTRAVENOUS
  Filled 2017-12-18: qty 40

## 2017-12-18 MED ORDER — ASPIRIN EC 81 MG PO TBEC
81.0000 mg | DELAYED_RELEASE_TABLET | Freq: Every day | ORAL | Status: DC
  Administered 2017-12-18 – 2017-12-19 (×2): 81 mg via ORAL
  Filled 2017-12-18 (×2): qty 1

## 2017-12-18 MED ORDER — HEPARIN SODIUM (PORCINE) 1000 UNIT/ML IJ SOLN
INTRAMUSCULAR | Status: AC
  Filled 2017-12-18: qty 1

## 2017-12-18 MED ORDER — SODIUM CHLORIDE 0.9 % IV SOLN
INTRAVENOUS | Status: DC | PRN
  Administered 2017-12-18: 30 ug/min via INTRAVENOUS

## 2017-12-18 MED ORDER — PROPOFOL 10 MG/ML IV BOLUS
INTRAVENOUS | Status: DC | PRN
  Administered 2017-12-18: 130 mg via INTRAVENOUS

## 2017-12-18 MED ORDER — LABETALOL HCL 5 MG/ML IV SOLN
10.0000 mg | INTRAVENOUS | Status: DC | PRN
  Administered 2017-12-18: 10 mg via INTRAVENOUS
  Filled 2017-12-18: qty 4

## 2017-12-18 MED ORDER — NALOXONE HCL 0.4 MG/ML IJ SOLN
INTRAMUSCULAR | Status: DC | PRN
  Administered 2017-12-18: .08 mg via INTRAVENOUS

## 2017-12-18 MED ORDER — BRIMONIDINE TARTRATE 0.2 % OP SOLN
1.0000 [drp] | Freq: Every day | OPHTHALMIC | Status: DC
  Administered 2017-12-18: 1 [drp] via OPHTHALMIC
  Filled 2017-12-18: qty 5

## 2017-12-18 MED ORDER — OXYCODONE HCL 5 MG PO TABS: 5.0000 mg | ORAL_TABLET | ORAL | Status: DC | PRN

## 2017-12-18 MED ORDER — SUGAMMADEX SODIUM 200 MG/2ML IV SOLN
INTRAVENOUS | Status: AC
  Filled 2017-12-18: qty 2

## 2017-12-18 MED ORDER — SODIUM CHLORIDE 0.9 % IV SOLN
1.5000 g | Freq: Two times a day (BID) | INTRAVENOUS | Status: AC
  Administered 2017-12-18 – 2017-12-19 (×2): 1.5 g via INTRAVENOUS
  Filled 2017-12-18 (×2): qty 1.5

## 2017-12-18 MED ORDER — EVICEL 2 ML EX KIT
PACK | CUTANEOUS | Status: DC | PRN
  Administered 2017-12-18: 2 mL

## 2017-12-18 MED ORDER — ONDANSETRON HCL 4 MG/2ML IJ SOLN
INTRAMUSCULAR | Status: AC
  Filled 2017-12-18: qty 2

## 2017-12-18 MED ORDER — GLYCOPYRROLATE 0.2 MG/ML IJ SOLN
INTRAMUSCULAR | Status: DC | PRN
  Administered 2017-12-18: 0.2 mg via INTRAVENOUS

## 2017-12-18 MED ORDER — EPHEDRINE SULFATE 50 MG/ML IJ SOLN
INTRAMUSCULAR | Status: AC
  Filled 2017-12-18: qty 1

## 2017-12-18 MED ORDER — DEXAMETHASONE SODIUM PHOSPHATE 10 MG/ML IJ SOLN
INTRAMUSCULAR | Status: DC | PRN
  Administered 2017-12-18: 5 mg via INTRAVENOUS

## 2017-12-18 MED ORDER — PHENYLEPHRINE HCL 10 MG/ML IJ SOLN
INTRAMUSCULAR | Status: DC | PRN
  Administered 2017-12-18: 200 ug via INTRAVENOUS
  Administered 2017-12-18 (×5): 100 ug via INTRAVENOUS

## 2017-12-18 MED ORDER — CEFAZOLIN SODIUM-DEXTROSE 1-4 GM/50ML-% IV SOLN
INTRAVENOUS | Status: AC
  Filled 2017-12-18: qty 50

## 2017-12-18 MED ORDER — MORPHINE SULFATE (PF) 2 MG/ML IV SOLN: 2.0000 mg | INTRAVENOUS | Status: DC | PRN

## 2017-12-18 MED ORDER — GLYCOPYRROLATE 0.2 MG/ML IJ SOLN
INTRAMUSCULAR | Status: AC
  Filled 2017-12-18: qty 1

## 2017-12-18 MED ORDER — FUROSEMIDE 20 MG PO TABS
40.0000 mg | ORAL_TABLET | Freq: Every day | ORAL | Status: DC
  Administered 2017-12-18 – 2017-12-19 (×2): 40 mg via ORAL
  Filled 2017-12-18 (×2): qty 1
  Filled 2017-12-18: qty 2

## 2017-12-18 MED ORDER — FENTANYL CITRATE (PF) 100 MCG/2ML IJ SOLN
INTRAMUSCULAR | Status: AC
  Filled 2017-12-18: qty 2

## 2017-12-18 MED ORDER — REMIFENTANIL HCL 1 MG IV SOLR
INTRAVENOUS | Status: DC | PRN
  Administered 2017-12-18: .05 ug/kg/min via INTRAVENOUS

## 2017-12-18 MED ORDER — ROCURONIUM BROMIDE 100 MG/10ML IV SOLN
INTRAVENOUS | Status: DC | PRN
  Administered 2017-12-18 (×2): 20 mg via INTRAVENOUS
  Administered 2017-12-18: 30 mg via INTRAVENOUS

## 2017-12-18 MED ORDER — LABETALOL HCL 5 MG/ML IV SOLN
INTRAVENOUS | Status: AC
  Filled 2017-12-18: qty 4

## 2017-12-18 MED ORDER — FAMOTIDINE 20 MG PO TABS
20.0000 mg | ORAL_TABLET | Freq: Once | ORAL | Status: AC
  Administered 2017-12-18: 20 mg via ORAL

## 2017-12-18 MED ORDER — LIDOCAINE HCL (PF) 1 % IJ SOLN
INTRAMUSCULAR | Status: AC
  Filled 2017-12-18: qty 2

## 2017-12-18 MED ORDER — SODIUM CHLORIDE 0.9 % IV SOLN: 500.0000 mL | Freq: Once | INTRAVENOUS | Status: DC | PRN

## 2017-12-18 MED ORDER — HEPARIN SODIUM (PORCINE) 1000 UNIT/ML IJ SOLN
INTRAMUSCULAR | Status: DC | PRN
  Administered 2017-12-18: 7000 [IU] via INTRAVENOUS

## 2017-12-18 MED ORDER — DOCUSATE SODIUM 100 MG PO CAPS
100.0000 mg | ORAL_CAPSULE | Freq: Every day | ORAL | Status: DC
  Administered 2017-12-19: 100 mg via ORAL
  Filled 2017-12-18: qty 1

## 2017-12-18 MED ORDER — EPHEDRINE SULFATE 50 MG/ML IJ SOLN
INTRAMUSCULAR | Status: DC | PRN
  Administered 2017-12-18: 10 mg via INTRAVENOUS

## 2017-12-18 MED ORDER — ONDANSETRON HCL 4 MG/2ML IJ SOLN: 4.0000 mg | Freq: Four times a day (QID) | INTRAMUSCULAR | Status: DC | PRN

## 2017-12-18 MED ORDER — SODIUM CHLORIDE 0.9 % IV SOLN
INTRAVENOUS | Status: DC
  Administered 2017-12-18: 50 mL/h via INTRAVENOUS

## 2017-12-18 MED ORDER — ISOSORBIDE MONONITRATE ER 30 MG PO TB24
30.0000 mg | ORAL_TABLET | Freq: Every day | ORAL | Status: DC
  Administered 2017-12-19: 30 mg via ORAL
  Filled 2017-12-18: qty 1

## 2017-12-18 MED ORDER — KCL IN DEXTROSE-NACL 20-5-0.9 MEQ/L-%-% IV SOLN
INTRAVENOUS | Status: DC
  Administered 2017-12-18 – 2017-12-19 (×2): via INTRAVENOUS
  Filled 2017-12-18 (×2): qty 1000

## 2017-12-18 MED ORDER — LIDOCAINE HCL (PF) 1 % IJ SOLN
INTRAMUSCULAR | Status: AC
  Filled 2017-12-18: qty 30

## 2017-12-18 MED ORDER — PROPOFOL 10 MG/ML IV BOLUS
INTRAVENOUS | Status: AC
  Filled 2017-12-18: qty 20

## 2017-12-18 MED ORDER — ACETAMINOPHEN 325 MG PO TABS
325.0000 mg | ORAL_TABLET | ORAL | Status: DC | PRN
  Administered 2017-12-18: 650 mg via ORAL
  Filled 2017-12-18: qty 2

## 2017-12-18 MED ORDER — ACETAMINOPHEN 650 MG RE SUPP: 325.0000 mg | RECTAL | Status: DC | PRN

## 2017-12-18 MED ORDER — VASOPRESSIN 20 UNIT/ML IV SOLN
INTRAVENOUS | Status: DC | PRN
  Administered 2017-12-18 (×2): 1 [IU] via INTRAVENOUS

## 2017-12-18 MED ORDER — ONDANSETRON HCL 4 MG/2ML IJ SOLN
4.0000 mg | Freq: Once | INTRAMUSCULAR | Status: DC | PRN
Start: 2017-12-18 — End: 2017-12-18

## 2017-12-18 MED ORDER — FENTANYL CITRATE (PF) 100 MCG/2ML IJ SOLN
25.0000 ug | INTRAMUSCULAR | Status: DC | PRN
  Administered 2017-12-18: 25 ug via INTRAVENOUS

## 2017-12-18 MED ORDER — POLYVINYL ALCOHOL 1.4 % OP SOLN
1.0000 [drp] | Freq: Every day | OPHTHALMIC | Status: DC
  Administered 2017-12-18: 1 [drp] via OPHTHALMIC
  Filled 2017-12-18: qty 15

## 2017-12-18 MED ORDER — NALOXONE HCL 0.4 MG/ML IJ SOLN
INTRAMUSCULAR | Status: AC
  Filled 2017-12-18: qty 1

## 2017-12-18 MED ORDER — HYDRALAZINE HCL 20 MG/ML IJ SOLN: 5.0000 mg | INTRAMUSCULAR | Status: DC | PRN

## 2017-12-18 MED ORDER — SODIUM CHLORIDE 0.9 % IV SOLN
INTRAVENOUS | Status: DC
  Administered 2017-12-18: 20 mL/h via INTRAVENOUS

## 2017-12-18 MED ORDER — FENTANYL CITRATE (PF) 100 MCG/2ML IJ SOLN
INTRAMUSCULAR | Status: DC | PRN
  Administered 2017-12-18: 100 ug via INTRAVENOUS

## 2017-12-18 MED ORDER — AMLODIPINE BESYLATE 5 MG PO TABS
5.0000 mg | ORAL_TABLET | Freq: Every day | ORAL | Status: DC
  Administered 2017-12-19: 5 mg via ORAL
  Filled 2017-12-18: qty 1

## 2017-12-18 MED ORDER — ONDANSETRON HCL 4 MG/2ML IJ SOLN
INTRAMUSCULAR | Status: DC | PRN
  Administered 2017-12-18: 4 mg via INTRAVENOUS

## 2017-12-18 MED ORDER — TIMOLOL MALEATE 0.5 % OP SOLN
1.0000 [drp] | Freq: Two times a day (BID) | OPHTHALMIC | Status: DC
  Administered 2017-12-18 – 2017-12-19 (×2): 1 [drp] via OPHTHALMIC
  Filled 2017-12-18: qty 5

## 2017-12-18 MED ORDER — LOSARTAN POTASSIUM 50 MG PO TABS
100.0000 mg | ORAL_TABLET | Freq: Every day | ORAL | Status: DC
  Administered 2017-12-18 – 2017-12-19 (×2): 100 mg via ORAL
  Filled 2017-12-18 (×2): qty 2

## 2017-12-18 MED ORDER — DEXAMETHASONE SODIUM PHOSPHATE 10 MG/ML IJ SOLN
INTRAMUSCULAR | Status: AC
  Filled 2017-12-18: qty 1

## 2017-12-18 MED ORDER — ALUM & MAG HYDROXIDE-SIMETH 200-200-20 MG/5ML PO SUSP
15.0000 mL | ORAL | Status: DC | PRN
  Filled 2017-12-18: qty 30

## 2017-12-18 MED ORDER — INSULIN ASPART 100 UNIT/ML ~~LOC~~ SOLN
0.0000 [IU] | Freq: Three times a day (TID) | SUBCUTANEOUS | Status: DC
  Administered 2017-12-18: 2 [IU] via SUBCUTANEOUS
  Filled 2017-12-18: qty 1

## 2017-12-18 SURGICAL SUPPLY — 65 items
APPLIER CLIP 11 MED OPEN (CLIP)
APPLIER CLIP 9.375 SM OPEN (CLIP)
BAG DECANTER FOR FLEXI CONT (MISCELLANEOUS) ×3 IMPLANT
BLADE SURG 15 STRL LF DISP TIS (BLADE) ×1 IMPLANT
BLADE SURG 15 STRL SS (BLADE) ×2
BLADE SURG SZ11 CARB STEEL (BLADE) ×3 IMPLANT
BOOT SUTURE AID YELLOW STND (SUTURE) ×6 IMPLANT
BRUSH SCRUB EZ  4% CHG (MISCELLANEOUS) ×2
BRUSH SCRUB EZ 4% CHG (MISCELLANEOUS) ×1 IMPLANT
CANISTER SUCT 1200ML W/VALVE (MISCELLANEOUS) ×3 IMPLANT
CLIP APPLIE 11 MED OPEN (CLIP) IMPLANT
CLIP APPLIE 9.375 SM OPEN (CLIP) IMPLANT
DERMABOND ADVANCED (GAUZE/BANDAGES/DRESSINGS) ×2
DERMABOND ADVANCED .7 DNX12 (GAUZE/BANDAGES/DRESSINGS) ×1 IMPLANT
DRAPE INCISE IOBAN 66X45 STRL (DRAPES) ×3 IMPLANT
DRAPE LAPAROTOMY 100X77 ABD (DRAPES) ×3 IMPLANT
DRAPE SHEET LG 3/4 BI-LAMINATE (DRAPES) ×3 IMPLANT
DRESSING SURGICEL FIBRLLR 1X2 (HEMOSTASIS) ×1 IMPLANT
DRSG SURGICEL FIBRILLAR 1X2 (HEMOSTASIS) ×3
DURAPREP 26ML APPLICATOR (WOUND CARE) ×3 IMPLANT
ELECT CAUTERY BLADE 6.4 (BLADE) ×3 IMPLANT
ELECT REM PT RETURN 9FT ADLT (ELECTROSURGICAL) ×3
ELECTRODE REM PT RTRN 9FT ADLT (ELECTROSURGICAL) ×1 IMPLANT
GLOVE BIO SURGEON STRL SZ7 (GLOVE) ×3 IMPLANT
GLOVE INDICATOR 7.5 STRL GRN (GLOVE) ×3 IMPLANT
GLOVE SURG SYN 8.0 (GLOVE) ×3 IMPLANT
GOWN STRL REUS W/ TWL LRG LVL3 (GOWN DISPOSABLE) ×1 IMPLANT
GOWN STRL REUS W/ TWL XL LVL3 (GOWN DISPOSABLE) ×1 IMPLANT
GOWN STRL REUS W/TWL LRG LVL3 (GOWN DISPOSABLE) ×2
GOWN STRL REUS W/TWL XL LVL3 (GOWN DISPOSABLE) ×2
HOLDER FOLEY CATH W/STRAP (MISCELLANEOUS) ×3 IMPLANT
IV NS 500ML (IV SOLUTION) ×2
IV NS 500ML BAXH (IV SOLUTION) ×1 IMPLANT
KIT TURNOVER KIT A (KITS) ×3 IMPLANT
LABEL OR SOLS (LABEL) ×3 IMPLANT
LOOP RED MAXI  1X406MM (MISCELLANEOUS) ×4
LOOP VESSEL MAXI 1X406 RED (MISCELLANEOUS) ×2 IMPLANT
LOOP VESSEL MINI 0.8X406 BLUE (MISCELLANEOUS) ×1 IMPLANT
LOOPS BLUE MINI 0.8X406MM (MISCELLANEOUS) ×2
NEEDLE FILTER BLUNT 18X 1/2SAF (NEEDLE) ×2
NEEDLE FILTER BLUNT 18X1 1/2 (NEEDLE) ×1 IMPLANT
NEEDLE HYPO 25X1 1.5 SAFETY (NEEDLE) ×3 IMPLANT
NS IRRIG 500ML POUR BTL (IV SOLUTION) ×3 IMPLANT
PACK BASIN MAJOR ARMC (MISCELLANEOUS) ×3 IMPLANT
PATCH CAROTID ECM VASC 1X10 (Prosthesis & Implant Heart) ×3 IMPLANT
PENCIL ELECTRO HAND CTR (MISCELLANEOUS) IMPLANT
SHUNT CAROTID STR REINF 3.0X4. (MISCELLANEOUS) ×3 IMPLANT
SUT MNCRL+ 5-0 UNDYED PC-3 (SUTURE) ×1 IMPLANT
SUT MONOCRYL 5-0 (SUTURE) ×2
SUT PROLENE 6 0 BV (SUTURE) ×24 IMPLANT
SUT PROLENE 7 0 BV 1 (SUTURE) ×18 IMPLANT
SUT SILK 2 0 (SUTURE) ×2
SUT SILK 2-0 18XBRD TIE 12 (SUTURE) ×1 IMPLANT
SUT SILK 3 0 (SUTURE) ×6
SUT SILK 3-0 18XBRD TIE 12 (SUTURE) ×3 IMPLANT
SUT SILK 4 0 (SUTURE) ×2
SUT SILK 4-0 18XBRD TIE 12 (SUTURE) ×1 IMPLANT
SUT VIC AB 3-0 SH 27 (SUTURE) ×2
SUT VIC AB 3-0 SH 27X BRD (SUTURE) ×1 IMPLANT
SYR 10ML LL (SYRINGE) ×3 IMPLANT
SYR 20CC LL (SYRINGE) ×3 IMPLANT
SYR 3ML LL SCALE MARK (SYRINGE) ×3 IMPLANT
TRAY FOLEY W/METER SILVER 16FR (SET/KITS/TRAYS/PACK) ×3 IMPLANT
TUBING CONNECTING 10 (TUBING) IMPLANT
TUBING CONNECTING 10' (TUBING)

## 2017-12-18 NOTE — Op Note (Signed)
[facility]   OPERATIVE NOTE  PROCEDURE:   1.  left carotid endarterectomy with CorMatrix arterial patch reconstruction  PRE-OPERATIVE DIAGNOSIS: 1.  Critical carotid stenosis 2. diabetes  POST-OPERATIVE DIAGNOSIS: same as above   SURGEON: Katha Cabal, MD  ASSISTANT(S): none  ANESTHESIA: general  ESTIMATED BLOOD LOSS: 100 cc  FINDING(S): 1.  Extensive calcified carotid plaque.  SPECIMEN(S):  Carotid plaque (sent to Pathology)  INDICATIONS:   Brain Honeycutt. is a 84 y.o. y.o. male who presents with critical  carotid stenosis of left of 95%.  The risks, benefits, and alternatives to carotid endarterectomy were discussed with the patient. The differences between carotid stenting and carotid endarterectomy were reviewed.  The patient voiced understanding and appears to be aware that the risks of carotid endarterectomy include but are not limited to: bleeding, infection, stroke, myocardial infarction, death, cranial nerve injuries both temporary and permanent, neck hematoma, possible airway compromise, labile blood pressure post-operatively, cerebral hyperperfusion syndrome, and possible need for additional interventions in the future. The patient is aware of the risks and agrees to proceed forward with the procedure.  DESCRIPTION: After full informed written consent was obtained from the patient, the patient was brought back to the operating room and placed supine upon the operating table.  Prior to induction, the patient received IV antibiotics.  After obtaining adequate anesthesia, the patient was placed a supine position with a shoulder roll in place and the patient's neck slightly hyperextended and rotated away from the surgical site.  The patient was prepped in the standard fashion for a carotid endarterectomy.    The incision was made anterior to the sternocleidomastoid muscle and dissected down through the subcutaneous tissue.  The platysmas was  opened with electrocautery.  The internal jugular vein and facial vein were identified.  The facial vein is ligated and divided between 2-0 silk ties.  The omohyoid was identified in the common carotid artery exposed at this level. The dissection was there in carried out along the carotid artery in a cranial direction.  The dissection was then carried along periadventitial plane along the common carotid artery up to the bifurcation. The external carotid artery was identified. Vessel loops were then placed around the external carotid artery as well as the superior thyroid artery. In the process of this dissection, the hypoglossal nerve was identified and protected from harm.  The internal carotid artery was then dissected circumferentially just beyond an area in the internal carotid artery distal to the plaque.    At this point, we gave the patient 7000 units of intravenous heparin.  After this was allowed to circulate for several minutes, the common carotid followed by the external carotid and then the internal carotid artery were clamped.  Arteriotomy was made in the common carotid artery with a 11 blade, and extended the arteriotomy with a Potts scissor down into the common carotid artery, then the arteriotomy was carried through the bifurcation into the internal carotid artery until I reached an area that was not diseased.  At this point, a Sundt shunt was placed.  The endarterectomy was begun in the common carotid artery with a Garment/textile technologist and carried this dissection down into the common carotid artery circumferentially.  Then I transected the plaque at a segment where it was adherent and transected the plaque with Potts scissors.  I then carried this dissection up into the external carotid artery.  The plaque was extracted by unclamping the external carotid artery and performing an  eversion endarterectomy.  The dissection was then carried into the internal carotid artery where a  feathered end point  was created.  The plaque was passed off the field as a specimen.  The distal endpoint was tacked down with 6 interrupted 7-0 Prolene sutures.  A CorMatrix arterial patch was delivered onto the field and trimmed appropriately for the artery and sewed in place with 6-0 Prolene using a 4 quadrant technique.  The medial suture line was completed and the lateral suture line was run approximately one quarter the length of the arteriotomy.  Prior to completing this patch angioplasty, the shunt was removed, the internal carotid artery was flushed and there was excellent backbleeding.  The carotid artery repair was flushed with heparinized saline and then the patch angioplasty was completed in the usual fashion.  The flow was then reestablished first to the external carotid artery and then the internal carotid artery to prevent distal embolization.   Several minutes of pressure were held and 6-0 Prolene patch sutures were used as need for hemostasis.  At this point, I placed Surgicel and Evicel topical hemostatic agents.  There was no more active bleeding in the surgical site.  The sternocleidomastoid space was closed with three interrupted 3-0 Vicryl sutures. I then reapproximated the platysma muscle with a running stitch of 3-0 Vicryl.  The skin was then closed with a running subcuticular 4-0 Monocryl.  The skin was then cleaned, dried and Dermabond was used to reinforce the skin closure.  The patient awakened and was taken to the recovery room in stable condition, following commands and moving all four extremities without any apparent deficits.    COMPLICATIONS: none  CONDITION: stable  Hortencia Pilar 12/18/2017<10:44 AM

## 2017-12-18 NOTE — Anesthesia Preprocedure Evaluation (Signed)
Anesthesia Evaluation  Patient identified by MRN, date of birth, ID band Patient awake    Reviewed: Allergy & Precautions, NPO status , Patient's Chart, lab work & pertinent test results, reviewed documented beta blocker date and time   Airway Mallampati: II  TM Distance: >3 FB     Dental  (+) Chipped   Pulmonary former smoker,           Cardiovascular hypertension, Pt. on medications + CAD and + Peripheral Vascular Disease       Neuro/Psych  Neuromuscular disease    GI/Hepatic   Endo/Other  diabetes, Type 2  Renal/GU Renal disease     Musculoskeletal  (+) Arthritis ,   Abdominal   Peds  Hematology  (+) anemia ,   Anesthesia Other Findings   Reproductive/Obstetrics                             Anesthesia Physical Anesthesia Plan  ASA: III  Anesthesia Plan: General   Post-op Pain Management:    Induction: Intravenous  PONV Risk Score and Plan:   Airway Management Planned: Oral ETT  Additional Equipment:   Intra-op Plan:   Post-operative Plan:   Informed Consent: I have reviewed the patients History and Physical, chart, labs and discussed the procedure including the risks, benefits and alternatives for the proposed anesthesia with the patient or authorized representative who has indicated his/her understanding and acceptance.     Plan Discussed with: CRNA  Anesthesia Plan Comments:         Anesthesia Quick Evaluation

## 2017-12-18 NOTE — H&P (Signed)
[facility] History & Physical Update  The patient was interviewed and re-examined.  The patient's previous History and Physical has been reviewed and is unchanged.  There is no change in the plan of care. We plan to proceed with the scheduled procedure.  Hortencia Pilar, MD  12/18/2017, 7:35 AM

## 2017-12-18 NOTE — Anesthesia Procedure Notes (Signed)
Arterial Line Insertion Start/End2/20/2019 8:23 AM Performed by: Gunnar Bulla, MD, Jonna Clark, CRNA, CRNA  Patient location: OR. Preanesthetic checklist: patient identified, IV checked, site marked, risks and benefits discussed, surgical consent, monitors and equipment checked, pre-op evaluation, timeout performed and anesthesia consent Patient sedated Left, radial was placed Catheter size: 20 G Hand hygiene performed  and maximum sterile barriers used  Allen's test indicative of satisfactory collateral circulation Attempts: 1 Procedure performed without using ultrasound guided technique. Following insertion, dressing applied. Post procedure assessment: normal  Patient tolerated the procedure well with no immediate complications.

## 2017-12-18 NOTE — Progress Notes (Signed)
Dr schnier called   To follow manual for blood pressures   Labetalol given for high blood pressure at 11:10

## 2017-12-18 NOTE — Anesthesia Post-op Follow-up Note (Signed)
Anesthesia QCDR form completed.        

## 2017-12-18 NOTE — Addendum Note (Signed)
Addendum  created 12/18/17 1511 by Doreen Salvage, CRNA   Charge Capture section accepted

## 2017-12-18 NOTE — Anesthesia Postprocedure Evaluation (Signed)
Anesthesia Post Note  Patient: [facility].  Procedure(s) Performed: ENDARTERECTOMY CAROTID WITH PATCH (Left )  Patient location during evaluation: PACU Anesthesia Type: General Level of consciousness: awake and alert Pain management: pain level controlled Vital Signs Assessment: post-procedure vital signs reviewed and stable Respiratory status: spontaneous breathing, nonlabored ventilation, respiratory function stable and patient connected to nasal cannula oxygen Cardiovascular status: blood pressure returned to baseline and stable Postop Assessment: no apparent nausea or vomiting Anesthetic complications: no     Last Vitals:  Vitals:   12/18/17 1152 12/18/17 1200  BP:  (!) 154/62  Pulse: 69 68  Resp: 19 16  Temp:  36.9 C  SpO2: 97% 97%    Last Pain:  Vitals:   12/18/17 0612  TempSrc: Tympanic                 Zenya Hickam S

## 2017-12-18 NOTE — Transfer of Care (Signed)
Immediate Anesthesia Transfer of Care Note  Patient: [facility].  Procedure(s) Performed: ENDARTERECTOMY CAROTID WITH PATCH (Left )  Patient Location: PACU  Anesthesia Type:General  Level of Consciousness: awake, drowsy and patient cooperative  Airway & Oxygen Therapy: Patient Spontanous Breathing and Patient connected to face mask oxygen  Post-op Assessment: Report given to RN and Post -op Vital signs reviewed and stable  Post vital signs: Reviewed and stable  Last Vitals:  Vitals:   12/18/17 0612 12/18/17 1055  BP: (!) 195/72 (!) 151/71  Pulse: 67 80  Resp: 12 18  Temp: 36.6 C   SpO2: 100% 98%    Last Pain:  Vitals:   12/18/17 0612  TempSrc: Tympanic         Complications: No apparent anesthesia complications

## 2017-12-18 NOTE — Anesthesia Procedure Notes (Signed)
Procedure Name: Intubation Date/Time: 12/18/2017 7:52 AM Performed by: Jonna Clark, CRNA Pre-anesthesia Checklist: Patient identified, Patient being monitored, Timeout performed, Emergency Drugs available and Suction available Patient Re-evaluated:Patient Re-evaluated prior to induction Oxygen Delivery Method: Circle system utilized Preoxygenation: Pre-oxygenation with 100% oxygen Induction Type: IV induction Ventilation: Mask ventilation without difficulty Laryngoscope Size: Mac and 3 Grade View: Grade I Tube type: Oral Tube size: 7.5 mm Number of attempts: 1 Placement Confirmation: ETT inserted through vocal cords under direct vision,  positive ETCO2 and breath sounds checked- equal and bilateral Secured at: 21 cm Tube secured with: Tape Dental Injury: Teeth and Oropharynx as per pre-operative assessment

## 2017-12-19 LAB — CBC
HEMATOCRIT: 29.2 % — AB (ref 40.0–52.0)
Hemoglobin: 9.7 g/dL — ABNORMAL LOW (ref 13.0–18.0)
MCH: 32.3 pg (ref 26.0–34.0)
MCHC: 33.3 g/dL (ref 32.0–36.0)
MCV: 97 fL (ref 80.0–100.0)
Platelets: 155 10*3/uL (ref 150–440)
RBC: 3.02 MIL/uL — AB (ref 4.40–5.90)
RDW: 13.9 % (ref 11.5–14.5)
WBC: 7.4 10*3/uL (ref 3.8–10.6)

## 2017-12-19 LAB — BASIC METABOLIC PANEL
ANION GAP: 8 (ref 5–15)
BUN: 35 mg/dL — ABNORMAL HIGH (ref 6–20)
CO2: 23 mmol/L (ref 22–32)
Calcium: 8.2 mg/dL — ABNORMAL LOW (ref 8.9–10.3)
Chloride: 110 mmol/L (ref 101–111)
Creatinine, Ser: 1.79 mg/dL — ABNORMAL HIGH (ref 0.61–1.24)
GFR calc Af Amer: 37 mL/min — ABNORMAL LOW (ref >60)
GFR calc non Af Amer: 32 mL/min — ABNORMAL LOW (ref >60)
GLUCOSE: 116 mg/dL — AB (ref 65–99)
POTASSIUM: 4.6 mmol/L (ref 3.5–5.1)
Sodium: 141 mmol/L (ref 135–145)

## 2017-12-19 LAB — MAGNESIUM: Magnesium: 2 mg/dL (ref 1.7–2.4)

## 2017-12-19 LAB — PHOSPHORUS: Phosphorus: 3.4 mg/dL (ref 2.5–4.6)

## 2017-12-19 LAB — GLUCOSE, CAPILLARY: Glucose-Capillary: 90 mg/dL (ref 65–99)

## 2017-12-19 NOTE — Progress Notes (Signed)
Patient discharged. Son at bedside and present for all discharge instructions. All questions answered. Patient voided 200 mL in urinal and walked one lap around unit without incident. No pain. Per son, patient able to sign his own discharge papers but son was present.

## 2017-12-19 NOTE — Discharge Summary (Signed)
[facility]    Discharge Summary    Patient ID:  [facility]. MRN: [id number] DOB/AGE: July 14, 1927 84 y.o.  Admit date: 12/18/2017 Discharge date: 12/19/2017 Date of Surgery: 12/18/2017 Surgeon: Surgeon(s): Schnier, Dolores Lory, MD  Admission Diagnosis: CAROTID ARTERY STENOSIS  Discharge Diagnoses:  CAROTID ARTERY STENOSIS  Secondary Diagnoses: Past Medical History:  Diagnosis Date  . Atherosclerosis of renal artery ([place])   . Essential hypertension, benign   . Occlusion and stenosis of carotid artery without mention of cerebral infarction    moderate left ICA stenosis  . Peripheral vascular disease, unspecified ([place])    mild lifestyle limiting claudication  . Pure hypercholesterolemia   . Renal insufficiency   . Type 2 diabetes mellitus (HCC)     Procedure(s): ENDARTERECTOMY CAROTID WITH PATCH  Discharged Condition: good  HPI:  The patient presented for elective left carotid endarterectomy.  On the day of admission he underwent successful left CEA with patch angioplasty.  There were no intraoperative complications.  His postoperative course has been without incident.  This morning he is awake and alert conversing fluently.  He states his neck is "a little sore when he swallows".  Neurologically he is intact tongue is midline all extremities are 5 out of 5 strength smile is symmetric.  Patient is fit for discharge to home.  Hospital Course:  [name]. is a 84 y.o. male is S/P Left Procedure(s): ENDARTERECTOMY CAROTID WITH PATCH Extubated: POD # 0 Physical exam: Neuro intact neck clean dry no evidence hematoma Post-op wounds clean, dry, intact or healing well Pt. Ambulating, voiding and taking PO diet without difficulty. Pt pain controlled with PO pain meds. Labs as below Complications:none  Consults:    Significant Diagnostic Studies: CBC Lab Results  Component Value Date   WBC 7.4 12/19/2017   HGB 9.7 (L) 12/19/2017    HCT 29.2 (L) 12/19/2017   MCV 97.0 12/19/2017   PLT 155 12/19/2017    BMET    Component Value Date/Time   NA 141 12/19/2017 0450   NA 137 04/09/2016 1008   NA 137 04/26/2012 1145   K 4.6 12/19/2017 0450   K 4.0 04/26/2012 1145   CL 110 12/19/2017 0450   CL 104 04/26/2012 1145   CO2 23 12/19/2017 0450   CO2 29 04/26/2012 1145   GLUCOSE 116 (H) 12/19/2017 0450   GLUCOSE 105 (H) 04/26/2012 1145   BUN 35 (H) 12/19/2017 0450   BUN 19 04/09/2016 1008   BUN 16 04/26/2012 1145   CREATININE 1.79 (H) 12/19/2017 0450   CREATININE 1.63 (H) 08/09/2017 1202   CALCIUM 8.2 (L) 12/19/2017 0450   CALCIUM 8.9 04/26/2012 1145   GFRNONAA 32 (L) 12/19/2017 0450   GFRNONAA 37 (L) 08/09/2017 1202   GFRAA 37 (L) 12/19/2017 0450   GFRAA 43 (L) 08/09/2017 1202   COAG Lab Results  Component Value Date   INR 1.06 12/10/2017     Disposition:  Discharge to :Home Discharge Instructions    Call MD for:  redness, tenderness, or signs of infection (pain, swelling, bleeding, redness, odor or green/yellow discharge around incision site)   Complete by:  As directed    Call MD for:  severe or increased pain, loss or decreased feeling  in affected limb(s)   Complete by:  As directed    Call MD for:  temperature >100.5   Complete by:  As directed    Discharge instructions   Complete by:  As directed  OK to shower   Driving Restrictions   Complete by:  As directed    No driving for 2 weeks   Lifting restrictions   Complete by:  As directed    No lifting more than 20 pounds for 2 weeks   Resume previous diet   Complete by:  As directed      Allergies as of 12/19/2017   No Known Allergies     Medication List    TAKE these medications   amLODipine 5 MG tablet Commonly known as:  NORVASC Take 1 tablet (5 mg total) by mouth daily.   aspirin EC 81 MG tablet Take 81 mg by mouth daily.   atorvastatin 40 MG tablet Commonly known as:  LIPITOR Take 1 tablet (40 mg total) by mouth at  bedtime.   brimonidine 0.2 % ophthalmic solution Commonly known as:  ALPHAGAN 1 drop Three (3) times a day. What changed:    how much to take  how to take this  when to take this  additional instructions   Fish Oil 1000 MG Caps Take 1,000 mg by mouth daily.   furosemide 40 MG tablet Commonly known as:  LASIX Take 40 mg by mouth daily.   isosorbide mononitrate 30 MG 24 hr tablet Commonly known as:  IMDUR Take 1 tablet (30 mg total) by mouth daily.   linagliptin 5 MG Tabs tablet Commonly known as:  TRADJENTA Take 1 tablet (5 mg total) by mouth daily. For diabetes   losartan 100 MG tablet Commonly known as:  COZAAR Take 100 mg by mouth daily.   nitroGLYCERIN 0.4 MG SL tablet Commonly known as:  NITROSTAT Place 1 tablet (0.4 mg total) under the tongue every 5 (five) minutes as needed for chest pain. Max of 3 pills; call 911   QC NASAL RELIEF MOISTURIZING 0.05 % nasal spray Generic drug:  oxymetazoline Place 1 spray into both nostrils 2 (two) times daily as needed for congestion. QC Nasal Spray   DEEP SEA NASAL SPRAY 0.65 % nasal spray Generic drug:  sodium chloride Place 1-2 sprays into the nose 4 (four) times daily as needed for congestion.   sodium chloride 0.65 % Soln nasal spray Commonly known as:  OCEAN Place 1 spray into both nostrils as needed for congestion.   SYSTANE 0.4-0.3 % Soln Generic drug:  Polyethyl Glycol-Propyl Glycol Place 1 drop into both eyes at bedtime.   timolol 0.5 % ophthalmic solution Commonly known as:  BETIMOL Place 1 drop into both eyes 2 (two) times daily. What changed:  when to take this      Verbal and written Discharge instructions given to the patient. Wound care per Discharge AVS Follow-up Information    Schnier, Dolores Lory, MD Follow up in 2 week(s).   Specialties:  Vascular Surgery, Cardiology, Radiology, Vascular Surgery Why:  with left carotid ultrasound Contact information: [place]  [address] [phone]           Signed: Hortencia Pilar, MD  12/19/2017, 8:18 AM

## 2017-12-20 LAB — SURGICAL PATHOLOGY

## 2018-01-03 ENCOUNTER — Other Ambulatory Visit (INDEPENDENT_AMBULATORY_CARE_PROVIDER_SITE_OTHER): Payer: Self-pay | Admitting: Vascular Surgery

## 2018-01-03 DIAGNOSIS — I6522 Occlusion and stenosis of left carotid artery: Secondary | ICD-10-CM

## 2018-01-03 DIAGNOSIS — Z9889 Other specified postprocedural states: Secondary | ICD-10-CM

## 2018-01-06 ENCOUNTER — Ambulatory Visit (INDEPENDENT_AMBULATORY_CARE_PROVIDER_SITE_OTHER): Payer: Medicare Other

## 2018-01-06 ENCOUNTER — Encounter (INDEPENDENT_AMBULATORY_CARE_PROVIDER_SITE_OTHER): Payer: Self-pay | Admitting: Vascular Surgery

## 2018-01-06 ENCOUNTER — Ambulatory Visit (INDEPENDENT_AMBULATORY_CARE_PROVIDER_SITE_OTHER): Payer: Medicare Other | Admitting: Vascular Surgery

## 2018-01-06 VITALS — BP 154/64 | HR 76 | Resp 16 | Ht 66.0 in | Wt 157.0 lb

## 2018-01-06 DIAGNOSIS — I739 Peripheral vascular disease, unspecified: Principal | ICD-10-CM

## 2018-01-06 DIAGNOSIS — E1122 Type 2 diabetes mellitus with diabetic chronic kidney disease: Secondary | ICD-10-CM

## 2018-01-06 DIAGNOSIS — N183 Chronic kidney disease, stage 3 unspecified: Secondary | ICD-10-CM

## 2018-01-06 DIAGNOSIS — E782 Mixed hyperlipidemia: Secondary | ICD-10-CM

## 2018-01-06 DIAGNOSIS — Z9889 Other specified postprocedural states: Secondary | ICD-10-CM

## 2018-01-06 DIAGNOSIS — I6522 Occlusion and stenosis of left carotid artery: Secondary | ICD-10-CM

## 2018-01-06 DIAGNOSIS — I779 Disorder of arteries and arterioles, unspecified: Secondary | ICD-10-CM

## 2018-01-06 NOTE — Progress Notes (Signed)
Subjective:    Patient ID: [facility] Lope., male    DOB: 18-Jan-1927, 84 y.o.   MRN: [id number] Chief Complaint  Patient presents with  . Follow-up    2 week Carotid   The patient presents for his first post procedure follow-up.  The patient is status post left carotid endarterectomy on December 18, 2017.  The patient reports an unremarkable postoperative course.  The patient denies any symptoms such as amaurosis fugax or neurological symptoms.  The patient denies any issues with his incision.  The patient underwent a bilateral carotid duplex which was notable for 1-39% stenosis to the right carotid artery and no hemodynamically significant plaque noted to the left internal carotid artery.  Both vertebral arteries are patent with antegrade flow.  Normal flow dynamics were seen in the bilateral subclavian arteries.  The patient denies any fever, nausea vomiting.   Review of Systems  Constitutional: Negative.   HENT: Negative.   Eyes: Negative.   Respiratory: Negative.   Cardiovascular: Negative.   Gastrointestinal: Negative.   Endocrine: Negative.   Genitourinary: Negative.   Musculoskeletal: Negative.   Skin: Negative.   Allergic/Immunologic: Negative.   Neurological: Negative.   Hematological: Negative.   Psychiatric/Behavioral: Negative.       Objective:   Physical Exam  Constitutional: He is oriented to person, place, and time. He appears well-developed and well-nourished. No distress.  HENT:  Head: Normocephalic and atraumatic.  Eyes: Conjunctivae are normal. Pupils are equal, round, and reactive to light.  Neck: Normal range of motion.  No carotid bruits noted on exam.  Incision is healing well.  Cardiovascular: Normal rate, regular rhythm, normal heart sounds and intact distal pulses.  Pulses:      Radial pulses are 2+ on the right side, and 2+ on the left side.  Pulmonary/Chest: Effort normal and breath sounds normal.  Musculoskeletal: Normal range of motion. He  exhibits no edema.  Neurological: He is alert and oriented to person, place, and time.  Skin: Skin is warm and dry. He is not diaphoretic.  Psychiatric: He has a normal mood and affect. His behavior is normal. Judgment and thought content normal.  Vitals reviewed.  BP (!) 154/64 (BP Location: Right Arm)   Pulse 76   Resp 16   Ht 5\' 6"  (1.676 m)   Wt 157 lb (71.2 kg)   BMI 25.34 kg/m   Past Medical History:  Diagnosis Date  . Atherosclerosis of renal artery ([place])   . Essential hypertension, benign   . Occlusion and stenosis of carotid artery without mention of cerebral infarction    moderate left ICA stenosis  . Peripheral vascular disease, unspecified ([place])    mild lifestyle limiting claudication  . Pure hypercholesterolemia   . Renal insufficiency   . Type 2 diabetes mellitus ([place])    Social History   Socioeconomic History  . Marital status: Widowed    Spouse name: Not on file  . Number of children: Not on file  . Years of education: Not on file  . Highest education level: Not on file  Social Needs  . Financial resource strain: Not on file  . Food insecurity - worry: Not on file  . Food insecurity - inability: Not on file  . Transportation needs - medical: Not on file  . Transportation needs - non-medical: Not on file  Occupational History  . Not on file  Tobacco Use  . Smoking status: Former Smoker    Types: Cigarettes    Last  attempt to quit: 10/29/1970    Years since quitting: 47.2  . Smokeless tobacco: Never Used  Substance and Sexual Activity  . Alcohol use: No    Alcohol/week: 0.0 oz  . Drug use: No  . Sexual activity: Not Currently  Other Topics Concern  . Not on file  Social History Narrative   Married, all children are healthy.    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    Past Surgical History:  Procedure Laterality Date  . APPENDECTOMY  1970's  . COLONOSCOPY    . coronary artery stenting  1999     at [facility]   . ENDARTERECTOMY Left 12/18/2017   Procedure: ENDARTERECTOMY CAROTID WITH PATCH;  Surgeon: Delana Meyer Dolores Lory, MD;  Location: [facility] ORS;  Service: Vascular;  Laterality: Left;  . FACIAL LACERATION REPAIR Left 12/13/2015   performed at [facility] by Dr. Marcial Pacas  . renal angiography     Family History  Problem Relation Age of Onset  . Diabetes Mother   . Hypertension Brother   . Hyperlipidemia Brother   . Diabetes Brother   . Hypertension Son   . Hypertension Son   . Birth defects Neg Hx   . Stroke Neg Hx    No Known Allergies     Assessment & Plan:  The patient presents for his first post procedure follow-up.  The patient is status post left carotid endarterectomy on December 18, 2017.  The patient reports an unremarkable postoperative course.  The patient denies any symptoms such as amaurosis fugax or neurological symptoms.  The patient denies any issues with his incision.  The patient underwent a bilateral carotid duplex which was notable for 1-39% stenosis to the right carotid artery and no hemodynamically significant plaque noted to the left internal carotid artery.  Both vertebral arteries are patent with antegrade flow.  Normal flow dynamics were seen in the bilateral subclavian arteries.  The patient denies any fever, nausea vomiting.  1. Bilateral carotid artery disease, unspecified type ([place]) - Stable Patient presents for his first post operative follow-up. The patient is status post a left carotid endarterectomy on December 18, 2017 Postoperative course has been unremarkable The patient underwent a carotid duplex which is notable for market improvement to the left internal carotid artery stenosis -when compared to the previous there is no left internal carotid artery stenosis.  Right 1-39% carotid artery stenosis The patient can start driving. The patient is to follow-up in 3 months  - VAS US CAROTID; Future  2. Type 2 diabetes mellitus with stage 3 chronic kidney  disease, without long-term current use of insulin (HCC) - Stable Encouraged good control as its slows the progression of atherosclerotic disease  3. Mixed hyperlipidemia - Stable Encouraged good control as its slows the progression of atherosclerotic disease  Current Outpatient Medications on File Prior to Visit  Medication Sig Dispense Refill  . amLODipine (NORVASC) 5 MG tablet Take 1 tablet (5 mg total) by mouth daily. 90 tablet 3  . aspirin EC 81 MG tablet Take 81 mg by mouth daily.     Marland Kitchen atorvastatin (LIPITOR) 40 MG tablet Take 1 tablet (40 mg total) by mouth at bedtime. 90 tablet 3  . brimonidine (ALPHAGAN) 0.2 % ophthalmic solution 1 drop Three (3) times a day. (Patient taking differently: Place 1 drop into both eyes at bedtime. ) 5 mL 2  . furosemide (LASIX) 40 MG tablet Take 40 mg by mouth daily.    Marland Kitchen  isosorbide mononitrate (IMDUR) 30 MG 24 hr tablet Take 1 tablet (30 mg total) by mouth daily.    Marland Kitchen linagliptin (TRADJENTA) 5 MG TABS tablet Take 1 tablet (5 mg total) by mouth daily. For diabetes 90 tablet 3  . losartan (COZAAR) 100 MG tablet Take 100 mg by mouth daily.    . nitroGLYCERIN (NITROSTAT) 0.4 MG SL tablet Place 1 tablet (0.4 mg total) under the tongue every 5 (five) minutes as needed for chest pain. Max of 3 pills; call 911 (Patient not taking: Reported on 12/18/2017) 25 tablet 3  . Omega-3 Fatty Acids (FISH OIL) 1000 MG CAPS Take 1,000 mg by mouth daily.     Marland Kitchen oxymetazoline (QC NASAL RELIEF MOISTURIZING) 0.05 % nasal spray Place 1 spray into both nostrils 2 (two) times daily as needed for congestion. QC Nasal Spray    . Polyethyl Glycol-Propyl Glycol (SYSTANE) 0.4-0.3 % SOLN Place 1 drop into both eyes at bedtime.    . sodium chloride (DEEP SEA NASAL SPRAY) 0.65 % nasal spray Place 1-2 sprays into the nose 4 (four) times daily as needed for congestion.    . sodium chloride (OCEAN) 0.65 % SOLN nasal spray Place 1 spray into both nostrils as needed for congestion. 50 mL 2  .  timolol (BETIMOL) 0.5 % ophthalmic solution Place 1 drop into both eyes 2 (two) times daily. (Patient taking differently: Place 1 drop into both eyes at bedtime. ) 10 mL 12   No current facility-administered medications on file prior to visit.    There are no Patient Instructions on file for this visit. No Follow-up on file.  Kashauna Celmer A Mary-Ann Pennella, PA-C

## 2018-01-23 ENCOUNTER — Ambulatory Visit: Payer: Medicare Other | Admitting: Family Medicine

## 2018-01-23 ENCOUNTER — Encounter: Payer: Self-pay | Admitting: Family Medicine

## 2018-01-23 DIAGNOSIS — I779 Disorder of arteries and arterioles, unspecified: Secondary | ICD-10-CM

## 2018-01-23 DIAGNOSIS — Z789 Other specified health status: Secondary | ICD-10-CM | POA: Insufficient documentation

## 2018-01-23 DIAGNOSIS — I10 Essential (primary) hypertension: Secondary | ICD-10-CM

## 2018-01-23 DIAGNOSIS — I739 Peripheral vascular disease, unspecified: Secondary | ICD-10-CM

## 2018-01-23 DIAGNOSIS — R413 Other amnesia: Secondary | ICD-10-CM | POA: Insufficient documentation

## 2018-01-23 DIAGNOSIS — Z0001 Encounter for general adult medical examination with abnormal findings: Secondary | ICD-10-CM

## 2018-01-23 DIAGNOSIS — Z0000 Encounter for general adult medical examination without abnormal findings: Secondary | ICD-10-CM | POA: Insufficient documentation

## 2018-01-23 DIAGNOSIS — I2510 Atherosclerotic heart disease of native coronary artery without angina pectoris: Secondary | ICD-10-CM

## 2018-01-23 NOTE — Assessment & Plan Note (Signed)
s/p LEFT CEA; doing well post-op; daughter asked about recent news about aspirin therapy, and I advised do not stop aspirin if vasc has him on it

## 2018-01-23 NOTE — Assessment & Plan Note (Addendum)
Discussed with patient; daughter present; full code, but would not want to just be left on machines if terminal or no hope

## 2018-01-23 NOTE — Assessment & Plan Note (Signed)
USPSTF grade A and B recommendations reviewed with patient; age-appropriate recommendations, preventive care, screening tests, etc discussed and encouraged; healthy living encouraged; see AVS for patient education given to patient  

## 2018-01-23 NOTE — Progress Notes (Signed)
Patient: Samuel Gracie., Male    DOB: [birth year], 84 y.o.   MRN: [id number]  Visit Date: 01/23/2018  Today's Provider: Enid Derry, MD   Chief Complaint  Patient presents with  . Medicare Wellness    Subjective:   Samuel Lipson. is a 84 y.o. male who presents today for his Subsequent Annual Wellness Visit. Here with his daughter Since last visit, he has left CEA; doing well  USPSTF grade A and B recommendations Depression:  Depression screen [facility] 2/9 01/23/2018 11/28/2017 08/19/2017 08/09/2017 02/06/2017  Decreased Interest 0 0 0 0 0  Down, Depressed, Hopeless 0 0 0 0 0  PHQ - 2 Score 0 0 0 0 0   Hypertension: BP Readings from Last 3 Encounters:  01/23/18 138/62  01/06/18 (!) 154/64  12/19/17 (!) 141/43   Obesity: Wt Readings from Last 3 Encounters:  01/23/18 156 lb (70.8 kg)  01/06/18 157 lb (71.2 kg)  12/18/17 159 lb 9.8 oz (72.4 kg)   BMI Readings from Last 3 Encounters:  01/23/18 24.80 kg/m  01/06/18 25.34 kg/m  12/18/17 24.27 kg/m    Immunizations: shingrix needed Skin cancer: no moles that worry him Lung cancer:  Used to smoke, but quit 40 years ago Prostate cancer: stop at 64 No results found for: PSA Colorectal cancer: stop at 54 AAA: n/a; seeing vascular doctor anyway Aspirin: taking  Diet: beans, corn, okra; not much red meat; eats chicken and fish Exercise: pretty active Alcohol: no alcohol Tobacco use: quit 40 some years ago HIV, hep B, hep C: no need STD testing and prevention (chl/gon/syphilis): no need  6CIT Screen 01/23/2018  What Year? 4 points  What month? 0 points  What time? 0 points  Count back from 20 4 points  Months in reverse 4 points  Repeat phrase 10 points  Total Score 22   Glucose: mouth dry in the morning, during day okay Glucose  Date Value Ref Range Status  04/26/2012 105 (H) 65 - 99 mg/dL Final   Glucose, Bld  Date Value Ref Range Status  12/19/2017 116 (H) 65 - 99 mg/dL Final  12/10/2017 116 (H) 65 - 99 mg/dL  Final  08/09/2017 125 (H) 65 - 99 mg/dL Final    Comment:    .            Fasting reference interval . For someone without known diabetes, a glucose value between 100 and 125 mg/dL is consistent with prediabetes and should be confirmed with a follow-up test. .    Glucose-Capillary  Date Value Ref Range Status  12/19/2017 90 65 - 99 mg/dL Final  12/18/2017 129 (H) 65 - 99 mg/dL Final  12/18/2017 132 (H) 65 - 99 mg/dL Final   Lipids:  Lab Results  Component Value Date   CHOL 139 08/09/2017   CHOL 121 02/08/2017   CHOL 141 08/08/2016   Lab Results  Component Value Date   HDL 59 08/09/2017   HDL 37 (L) 02/08/2017   HDL 60 08/08/2016   Lab Results  Component Value Date   LDLCALC 66 08/09/2017   LDLCALC 73 02/08/2017   LDLCALC 72 08/08/2016   Lab Results  Component Value Date   TRIG 49 08/09/2017   TRIG 56 02/08/2017   TRIG 47 08/08/2016   Lab Results  Component Value Date   CHOLHDL 2.4 08/09/2017   CHOLHDL 3.3 02/08/2017   CHOLHDL 2.4 08/08/2016   No results found for: LDLDIRECT   Review of Systems  HENT: Positive for  nosebleeds (just once, right side nostril).   Cardiovascular: Positive for chest pain (lasts a second, "no time"; doesn't happen often; seeing heart doctor, he knows about this).    Past Medical History:  Diagnosis Date  . Atherosclerosis of renal artery ([place])   . Essential hypertension, benign   . Occlusion and stenosis of carotid artery without mention of cerebral infarction    moderate left ICA stenosis  . Peripheral vascular disease, unspecified ([place])    mild lifestyle limiting claudication  . Pure hypercholesterolemia   . Renal insufficiency   . Type 2 diabetes mellitus ([place])     Past Surgical History:  Procedure Laterality Date  . APPENDECTOMY  1970's  . COLONOSCOPY    . coronary artery stenting  1999   at [facility]   . ENDARTERECTOMY Left 12/18/2017   Procedure: ENDARTERECTOMY CAROTID WITH PATCH;  Surgeon: Delana Meyer Dolores Lory, MD;   Location: [facility] ORS;  Service: Vascular;  Laterality: Left;  . FACIAL LACERATION REPAIR Left 12/13/2015   performed at [facility] by Dr. Marcial Pacas  . renal angiography      Family History  Problem Relation Age of Onset  . Diabetes Mother   . Heart disease Mother   . Hypertension Brother   . Hyperlipidemia Brother   . Diabetes Brother   . Hypertension Son   . Hypertension Son   . Birth defects Neg Hx   . Stroke Neg Hx     Social History   Tobacco Use  . Smoking status: Former Smoker    Types: Cigarettes    Last attempt to quit: 10/29/1970    Years since quitting: 47.2  . Smokeless tobacco: Never Used  Substance Use Topics  . Alcohol use: No    Alcohol/week: 0.0 oz  . Drug use: No    Outpatient Encounter Medications as of 01/23/2018  Medication Sig  . amLODipine (NORVASC) 5 MG tablet Take 1 tablet (5 mg total) by mouth daily.  Marland Kitchen aspirin EC 81 MG tablet Take 81 mg by mouth daily.   Marland Kitchen atorvastatin (LIPITOR) 40 MG tablet Take 1 tablet (40 mg total) by mouth at bedtime.  . brimonidine (ALPHAGAN) 0.2 % ophthalmic solution 1 drop Three (3) times a day. (Patient taking differently: Place 1 drop into both eyes at bedtime. )  . furosemide (LASIX) 40 MG tablet Take 40 mg by mouth daily.  . isosorbide mononitrate (IMDUR) 30 MG 24 hr tablet Take 1 tablet (30 mg total) by mouth daily.  Marland Kitchen linagliptin (TRADJENTA) 5 MG TABS tablet Take 1 tablet (5 mg total) by mouth daily. For diabetes  . losartan (COZAAR) 100 MG tablet Take 100 mg by mouth daily.  . nitroGLYCERIN (NITROSTAT) 0.4 MG SL tablet Place 1 tablet (0.4 mg total) under the tongue every 5 (five) minutes as needed for chest pain. Max of 3 pills; call 911  . Omega-3 Fatty Acids (FISH OIL) 1000 MG CAPS Take 1,000 mg by mouth daily.   Vladimir Faster Glycol-Propyl Glycol (SYSTANE) 0.4-0.3 % SOLN Place 1 drop into both eyes at bedtime.  . timolol (BETIMOL) 0.5 % ophthalmic solution Place 1 drop into both eyes 2 (two) times daily. (Patient taking  differently: Place 1 drop into both eyes at bedtime. )  . oxymetazoline (QC NASAL RELIEF MOISTURIZING) 0.05 % nasal spray Place 1 spray into both nostrils 2 (two) times daily as needed for congestion. QC Nasal Spray  . sodium chloride (DEEP SEA NASAL SPRAY) 0.65 % nasal spray Place 1-2 sprays into the nose  4 (four) times daily as needed for congestion.  . sodium chloride (OCEAN) 0.65 % SOLN nasal spray Place 1 spray into both nostrils as needed for congestion. (Patient not taking: Reported on 01/23/2018)   No facility-administered encounter medications on file as of 01/23/2018.     Functional Ability / Safety Screening 1.  Was the timed Get Up and Go test less than 12 seconds?  yes 2.  Does the patient need help with the phone, transportation, shopping,      preparing meals, housework, laundry, medications, or managing money?  yes 3.  Does the patient's home have:  loose throw rugs in the hallway?   no      Grab bars in the bathroom? yes      Handrails on the stairs?   yes      Good lighting?   yes 4.  Has the patient noticed any hearing difficulties?   yes  Advanced Directives Does patient have a HCPOA?    yes If yes, name and contact information: Samuel Bryan, telephone [phone] Does patient have a living will or MOST form?  yes  Full code; but would not want to be left on machines if terminal and no hope  Immunizations: shingrix available at pharmacy  Fall Risk Assessment See under rooming  Depression Screen See under rooming Depression screen [facility] 2/9 01/23/2018 11/28/2017 08/19/2017 08/09/2017 02/06/2017  Decreased Interest 0 0 0 0 0  Down, Depressed, Hopeless 0 0 0 0 0  PHQ - 2 Score 0 0 0 0 0    Objective:   Vitals: BP 138/62   Pulse 74   Temp 97.9 F (36.6 C) (Oral)   Resp 14   Ht 5' 6.5" (1.689 m)   Wt 156 lb (70.8 kg)   SpO2 95%   BMI 24.80 kg/m  Body mass index is 24.8 kg/m. No exam data present  Physical Exam  HENT:  Right Ear: Decreased hearing is  noted.  Left Ear: Decreased hearing is noted.  Nose: No rhinorrhea. No epistaxis.  Mouth/Throat: Mucous membranes are not dry.  One tiny dilated blood vessel on the RIGHT septum   Mood/affect:  euthymic Appearance:  Casually dressed  6CIT Screen 01/23/2018  What Year? 4 points  What month? 0 points  What time? 0 points  Count back from 20 4 points  Months in reverse 4 points  Repeat phrase 10 points  Total Score 22    Assessment & Plan:     Annual Wellness Visit  Reviewed patient's Family Medical History Reviewed and updated list of patient's medical providers Assessment of cognitive impairment was done Assessed patient's functional ability Established a written schedule for health screening [place] Completed and Reviewed  Exercise Activities and Dietary recommendations Goals    . LIFESTYLE - DECREASE FALLS RISK       Immunization History  Administered Date(s) Administered  . Influenza, High Dose Seasonal PF 09/13/2015, 08/08/2016, 08/09/2017  . Influenza,inj,Quad PF,6+ Mos 07/08/2014  . Influenza-Unspecified 08/04/2007, 08/10/2008, 08/06/2012, 06/19/2013  . Pneumococcal Conjugate-13 04/20/2014  . Pneumococcal Polysaccharide-23 09/13/2015  . Tdap 06/27/2011  . Zoster 07/12/2008    Health Maintenance  Topic Date Due  . HEMOGLOBIN A1C  02/07/2018  . FOOT EXAM  11/28/2018  . OPHTHALMOLOGY EXAM  01/03/2019  . TETANUS/TDAP  06/26/2021  . INFLUENZA VACCINE  Completed  . PNA vac Low Risk Adult  Completed    Discussed health benefits of physical activity, and encouraged him to engage in regular exercise  appropriate for his age and condition.   No orders of the defined types were placed in this encounter.   Current Outpatient Medications:  .  amLODipine (NORVASC) 5 MG tablet, Take 1 tablet (5 mg total) by mouth daily., Disp: 90 tablet, Rfl: 3 .  aspirin EC 81 MG tablet, Take 81 mg by mouth daily. , Disp: , Rfl:  .  atorvastatin (LIPITOR)  40 MG tablet, Take 1 tablet (40 mg total) by mouth at bedtime., Disp: 90 tablet, Rfl: 3 .  brimonidine (ALPHAGAN) 0.2 % ophthalmic solution, 1 drop Three (3) times a day. (Patient taking differently: Place 1 drop into both eyes at bedtime. ), Disp: 5 mL, Rfl: 2 .  furosemide (LASIX) 40 MG tablet, Take 40 mg by mouth daily., Disp: , Rfl:  .  isosorbide mononitrate (IMDUR) 30 MG 24 hr tablet, Take 1 tablet (30 mg total) by mouth daily., Disp: , Rfl:  .  linagliptin (TRADJENTA) 5 MG TABS tablet, Take 1 tablet (5 mg total) by mouth daily. For diabetes, Disp: 90 tablet, Rfl: 3 .  losartan (COZAAR) 100 MG tablet, Take 100 mg by mouth daily., Disp: , Rfl:  .  nitroGLYCERIN (NITROSTAT) 0.4 MG SL tablet, Place 1 tablet (0.4 mg total) under the tongue every 5 (five) minutes as needed for chest pain. Max of 3 pills; call 911, Disp: 25 tablet, Rfl: 3 .  Omega-3 Fatty Acids (FISH OIL) 1000 MG CAPS, Take 1,000 mg by mouth daily. , Disp: , Rfl:  .  Polyethyl Glycol-Propyl Glycol (SYSTANE) 0.4-0.3 % SOLN, Place 1 drop into both eyes at bedtime., Disp: , Rfl:  .  timolol (BETIMOL) 0.5 % ophthalmic solution, Place 1 drop into both eyes 2 (two) times daily. (Patient taking differently: Place 1 drop into both eyes at bedtime. ), Disp: 10 mL, Rfl: 12 .  oxymetazoline (QC NASAL RELIEF MOISTURIZING) 0.05 % nasal spray, Place 1 spray into both nostrils 2 (two) times daily as needed for congestion. QC Nasal Spray, Disp: , Rfl:  .  sodium chloride (DEEP SEA NASAL SPRAY) 0.65 % nasal spray, Place 1-2 sprays into the nose 4 (four) times daily as needed for congestion., Disp: , Rfl:  .  sodium chloride (OCEAN) 0.65 % SOLN nasal spray, Place 1 spray into both nostrils as needed for congestion. (Patient not taking: Reported on 01/23/2018), Disp: 50 mL, Rfl: 2 There are no discontinued medications.  Next Medicare Wellness Visit in 12+ months  Problem List Items Addressed This Visit      Cardiovascular and Mediastinum   Essential  hypertension (Chronic)    Controlled today      Coronary atherosclerosis (Chronic)    Refer to new cardiologist      Relevant Orders   Ambulatory referral to Cardiology   Carotid arterial disease ([place]) (Chronic)    s/p LEFT CEA; doing well post-op; daughter asked about recent news about aspirin therapy, and I advised do not stop aspirin if vasc has him on it        Other   Memory loss    Patient failed the 6-CIT today; recommended either referral to neuro with imaging (aggressive end) or I could simply start medicine if not referral or imaging desired (more conservative end); they desired neither today and will think about this; I explained that with his family longevity, I would like to preserve his ability to remember as long as possible so he doesn't end up needing nursing home care; they will consider and may call me  back      Medicare annual wellness visit, subsequent    USPSTF grade A and B recommendations reviewed with patient; age-appropriate recommendations, preventive care, screening tests, etc discussed and encouraged; healthy living encouraged; see AVS for patient education given to patient      Full code status    Discussed with patient; daughter present; full code, but would not want to just be left on machines if terminal or no hope      Relevant Orders   Full code

## 2018-01-23 NOTE — Assessment & Plan Note (Signed)
Patient failed the 6-CIT today; recommended either referral to neuro with imaging (aggressive end) or I could simply start medicine if not referral or imaging desired (more conservative end); they desired neither today and will think about this; I explained that with his family longevity, I would like to preserve his ability to remember as long as possible so he doesn't end up needing nursing home care; they will consider and may call me back

## 2018-01-23 NOTE — Assessment & Plan Note (Signed)
Controlled today 

## 2018-01-23 NOTE — Assessment & Plan Note (Signed)
Refer to new cardiologist

## 2018-01-23 NOTE — Patient Instructions (Addendum)
Stay off of the flonase Consider either starting medicine to help slow memory loss or see a specialist (neurologist) about your memory I can be as aggressive or as conservative as you want Consider getting the new shingles vaccine called Shingrix; that is available for individuals 82 years of age and older, and is recommended even if you have had shingles in the past and/or already received the old shingles vaccine (Zostavax); it is a two-part series, and is available at many local pharmacies We'll have you see a new heart doctor  Health Maintenance  Topic Date Due  . HEMOGLOBIN A1C  02/07/2018  . FOOT EXAM  11/28/2018  . OPHTHALMOLOGY EXAM  01/03/2019  . TETANUS/TDAP  06/26/2021  . INFLUENZA VACCINE  Completed  . PNA vac Low Risk Adult  Completed     Fall Prevention in the Home Falls can cause injuries. They can happen to people of all ages. There are many things you can do to make your home safe and to help prevent falls. What can I do on the outside of my home?  Regularly fix the edges of walkways and driveways and fix any cracks.  Remove anything that might make you trip as you walk through a door, such as a raised step or threshold.  Trim any bushes or trees on the path to your home.  Use bright outdoor lighting.  Clear any walking paths of anything that might make someone trip, such as rocks or tools.  Regularly check to see if handrails are loose or broken. Make sure that both sides of any steps have handrails.  Any raised decks and porches should have guardrails on the edges.  Have any leaves, snow, or ice cleared regularly.  Use sand or salt on walking paths during winter.  Clean up any spills in your garage right away. This includes oil or grease spills. What can I do in the bathroom?  Use night lights.  Install grab bars by the toilet and in the tub and shower. Do not use towel bars as grab bars.  Use non-skid mats or decals in the tub or shower.  If you  need to sit down in the shower, use a plastic, non-slip stool.  Keep the floor dry. Clean up any water that spills on the floor as soon as it happens.  Remove soap buildup in the tub or shower regularly.  Attach bath mats securely with double-sided non-slip rug tape.  Do not have throw rugs and other things on the floor that can make you trip. What can I do in the bedroom?  Use night lights.  Make sure that you have a light by your bed that is easy to reach.  Do not use any sheets or blankets that are too big for your bed. They should not hang down onto the floor.  Have a firm chair that has side arms. You can use this for support while you get dressed.  Do not have throw rugs and other things on the floor that can make you trip. What can I do in the kitchen?  Clean up any spills right away.  Avoid walking on wet floors.  Keep items that you use a lot in easy-to-reach places.  If you need to reach something above you, use a strong step stool that has a grab bar.  Keep electrical cords out of the way.  Do not use floor polish or wax that makes floors slippery. If you must use wax, use non-skid floor  wax.  Do not have throw rugs and other things on the floor that can make you trip. What can I do with my stairs?  Do not leave any items on the stairs.  Make sure that there are handrails on both sides of the stairs and use them. Fix handrails that are broken or loose. Make sure that handrails are as long as the stairways.  Check any carpeting to make sure that it is firmly attached to the stairs. Fix any carpet that is loose or worn.  Avoid having throw rugs at the top or bottom of the stairs. If you do have throw rugs, attach them to the floor with carpet tape.  Make sure that you have a light switch at the top of the stairs and the bottom of the stairs. If you do not have them, ask someone to add them for you. What else can I do to help prevent falls?  Wear shoes  that: ? Do not have high heels. ? Have rubber bottoms. ? Are comfortable and fit you well. ? Are closed at the toe. Do not wear sandals.  If you use a stepladder: ? Make sure that it is fully opened. Do not climb a closed stepladder. ? Make sure that both sides of the stepladder are locked into place. ? Ask someone to hold it for you, if possible.  Clearly mark and make sure that you can see: ? Any grab bars or handrails. ? First and last steps. ? Where the edge of each step is.  Use tools that help you move around (mobility aids) if they are needed. These include: ? Canes. ? Walkers. ? Scooters. ? Crutches.  Turn on the lights when you go into a dark area. Replace any light bulbs as soon as they burn out.  Set up your furniture so you have a clear path. Avoid moving your furniture around.  If any of your floors are uneven, fix them.  If there are any pets around you, be aware of where they are.  Review your medicines with your doctor. Some medicines can make you feel dizzy. This can increase your chance of falling. Ask your doctor what other things that you can do to help prevent falls. This information is not intended to replace advice given to you by your health care provider. Make sure you discuss any questions you have with your health care provider. Document Released: 08/11/2009 Document Revised: 03/22/2016 Document Reviewed: 11/19/2014 Elsevier Interactive Patient Education  2018 [place] Maintenance, Male A healthy lifestyle and preventive care is important for your health and wellness. Ask your health care provider about what schedule of regular examinations is right for you. What should I know about weight and diet? Eat a Healthy Diet  Eat plenty of vegetables, fruits, whole grains, low-fat dairy products, and lean protein.  Do not eat a lot of foods high in solid fats, added sugars, or salt.  Maintain a Healthy Weight Regular exercise can help  you achieve or maintain a healthy weight. You should:  Do at least 150 minutes of exercise each week. The exercise should increase your heart rate and make you sweat (moderate-intensity exercise).  Do strength-training exercises at least twice a week.  Watch Your Levels of Cholesterol and Blood Lipids  Have your blood tested for lipids and cholesterol every 5 years starting at 82 years of age. If you are at high risk for heart disease, you should start having your blood tested when  you are 82 years old. You may need to have your cholesterol levels checked more often if: ? Your lipid or cholesterol levels are high. ? You are older than 82 years of age. ? You are at high risk for heart disease.  What should I know about cancer screening? Many types of cancers can be detected early and may often be prevented. Lung Cancer  You should be screened every year for lung cancer if: ? You are a current smoker who has smoked for at least 30 years. ? You are a former smoker who has quit within the past 15 years.  Talk to your health care provider about your screening options, when you should start screening, and how often you should be screened.  Colorectal Cancer  Routine colorectal cancer screening usually begins at 82 years of age and should be repeated every 5-10 years until you are 82 years old. You may need to be screened more often if early forms of precancerous polyps or small growths are found. Your health care provider may recommend screening at an earlier age if you have risk factors for colon cancer.  Your health care provider may recommend using home test kits to check for hidden blood in the stool.  A small camera at the end of a tube can be used to examine your colon (sigmoidoscopy or colonoscopy). This checks for the earliest forms of colorectal cancer.  Prostate and Testicular Cancer  Depending on your age and overall health, your health care provider may do certain tests to  screen for prostate and testicular cancer.  Talk to your health care provider about any symptoms or concerns you have about testicular or prostate cancer.  Skin Cancer  Check your skin from head to toe regularly.  Tell your health care provider about any new moles or changes in moles, especially if: ? There is a change in a mole's size, shape, or color. ? You have a mole that is larger than a pencil eraser.  Always use sunscreen. Apply sunscreen liberally and repeat throughout the day.  Protect yourself by wearing long sleeves, pants, a wide-brimmed hat, and sunglasses when outside.  What should I know about heart disease, diabetes, and high blood pressure?  If you are 32-41 years of age, have your blood pressure checked every 3-5 years. If you are 88 years of age or older, have your blood pressure checked every year. You should have your blood pressure measured twice-once when you are at a hospital or clinic, and once when you are not at a hospital or clinic. Record the average of the two measurements. To check your blood pressure when you are not at a hospital or clinic, you can use: ? An automated blood pressure machine at a pharmacy. ? A home blood pressure monitor.  Talk to your health care provider about your target blood pressure.  If you are between 29-24 years old, ask your health care provider if you should take aspirin to prevent heart disease.  Have regular diabetes screenings by checking your fasting blood sugar level. ? If you are at a normal weight and have a low risk for diabetes, have this test once every three years after the age of 52. ? If you are overweight and have a high risk for diabetes, consider being tested at a younger age or more often.  A one-time screening for abdominal aortic aneurysm (AAA) by ultrasound is recommended for men aged 58-75 years who are current or former smokers. What  should I know about preventing infection? Hepatitis B If you have a  higher risk for hepatitis B, you should be screened for this virus. Talk with your health care provider to find out if you are at risk for hepatitis B infection. Hepatitis C Blood testing is recommended for:  Everyone born from [birth year] through 1963.  Anyone with known risk factors for hepatitis C.  Sexually Transmitted Diseases (STDs)  You should be screened each year for STDs including gonorrhea and chlamydia if: ? You are sexually active and are younger than 82 years of age. ? You are older than 82 years of age and your health care provider tells you that you are at risk for this type of infection. ? Your sexual activity has changed since you were last screened and you are at an increased risk for chlamydia or gonorrhea. Ask your health care provider if you are at risk.  Talk with your health care provider about whether you are at high risk of being infected with HIV. Your health care provider may recommend a prescription medicine to help prevent HIV infection.  What else can I do?  Schedule regular health, dental, and eye exams.  Stay current with your vaccines (immunizations).  Do not use any tobacco products, such as cigarettes, chewing tobacco, and e-cigarettes. If you need help quitting, ask your health care provider.  Limit alcohol intake to no more than 2 drinks per day. One drink equals 12 ounces of beer, 5 ounces of wine, or 1 ounces of hard liquor.  Do not use street drugs.  Do not share needles.  Ask your health care provider for help if you need support or information about quitting drugs.  Tell your health care provider if you often feel depressed.  Tell your health care provider if you have ever been abused or do not feel safe at home. This information is not intended to replace advice given to you by your health care provider. Make sure you discuss any questions you have with your health care provider. Document Released: 04/12/2008 Document Revised: 06/13/2016  Document Reviewed: 07/19/2015 Elsevier Interactive Patient Education  [employer].

## 2018-02-18 ENCOUNTER — Ambulatory Visit (INDEPENDENT_AMBULATORY_CARE_PROVIDER_SITE_OTHER): Payer: Medicare Other | Admitting: Family Medicine

## 2018-02-18 ENCOUNTER — Encounter: Payer: Self-pay | Admitting: Family Medicine

## 2018-02-18 VITALS — BP 140/62 | HR 60 | Temp 97.5°F | Resp 14 | Ht 67.0 in | Wt 156.8 lb

## 2018-02-18 DIAGNOSIS — E1122 Type 2 diabetes mellitus with diabetic chronic kidney disease: Secondary | ICD-10-CM

## 2018-02-18 DIAGNOSIS — I779 Disorder of arteries and arterioles, unspecified: Secondary | ICD-10-CM

## 2018-02-18 DIAGNOSIS — N183 Chronic kidney disease, stage 3 unspecified: Secondary | ICD-10-CM

## 2018-02-18 DIAGNOSIS — Z5181 Encounter for therapeutic drug level monitoring: Secondary | ICD-10-CM

## 2018-02-18 DIAGNOSIS — R809 Proteinuria, unspecified: Secondary | ICD-10-CM

## 2018-02-18 DIAGNOSIS — D649 Anemia, unspecified: Secondary | ICD-10-CM

## 2018-02-18 DIAGNOSIS — I10 Essential (primary) hypertension: Secondary | ICD-10-CM

## 2018-02-18 DIAGNOSIS — I739 Peripheral vascular disease, unspecified: Secondary | ICD-10-CM

## 2018-02-18 LAB — CBC WITH DIFFERENTIAL/PLATELET
BASOS ABS: 11 {cells}/uL (ref 0–200)
Basophils Relative: 0.3 %
EOS ABS: 119 {cells}/uL (ref 15–500)
Eosinophils Relative: 3.3 %
HCT: 35 % — ABNORMAL LOW (ref 38.5–50.0)
Hemoglobin: 11.4 g/dL — ABNORMAL LOW (ref 13.2–17.1)
Lymphs Abs: 889 cells/uL (ref 850–3900)
MCH: 31.1 pg (ref 27.0–33.0)
MCHC: 32.6 g/dL (ref 32.0–36.0)
MCV: 95.6 fL (ref 80.0–100.0)
MONOS PCT: 17.6 %
MPV: 11.3 fL (ref 7.5–12.5)
NEUTROS PCT: 54.1 %
Neutro Abs: 1948 cells/uL (ref 1500–7800)
PLATELETS: 203 10*3/uL (ref 140–400)
RBC: 3.66 10*6/uL — ABNORMAL LOW (ref 4.20–5.80)
RDW: 12.2 % (ref 11.0–15.0)
TOTAL LYMPHOCYTE: 24.7 %
WBC mixed population: 634 cells/uL (ref 200–950)
WBC: 3.6 10*3/uL — ABNORMAL LOW (ref 3.8–10.8)

## 2018-02-18 LAB — LIPID PANEL
Cholesterol: 125 mg/dL (ref <200)
HDL: 38 mg/dL — ABNORMAL LOW (ref >40)
LDL Cholesterol (Calc): 73 mg/dL (calc)
Non-HDL Cholesterol (Calc): 87 mg/dL (calc) (ref <130)
Total CHOL/HDL Ratio: 3.3 (calc) (ref <5.0)
Triglycerides: 62 mg/dL (ref <150)

## 2018-02-18 NOTE — Assessment & Plan Note (Signed)
Stable after surgery; healing without complications

## 2018-02-18 NOTE — Assessment & Plan Note (Signed)
Check A1c today; foot exam by MD; eye exam UTD; avoiding sugary drinks

## 2018-02-18 NOTE — Assessment & Plan Note (Signed)
Following with nephrologist; avoiding NSAIDs; checking A1c

## 2018-02-18 NOTE — Assessment & Plan Note (Signed)
Check BP at home in the morning to see if perhaps his AM fatigue is the result of hypotension; also check FSBS in the AM to see if that correlates; will have them also see if he snores; contact me with issues

## 2018-02-18 NOTE — Patient Instructions (Addendum)
Let us know the exact name of your meter and we'll be glad to get you the strips for it Check sugars once a day or just when desired Have daughter check your blood pressure in the mornings when you are tired We'll call Cortlan Dolin at [phone] with your lab results Please do see your eye doctor regularly, and have your eyes examined every year (or more often per his or her recommendation) Check your feet every night and let me know right away of any sores, infections, numbness, etc. Try to limit sweets, white bread, white rice, white potatoes It is okay with me for you to not check your fingerstick blood sugars (per [employer] of Endocrinology Best Practices), unless you are interested and feel it would be helpful for you Try to limit saturated fats in your diet (bologna, hot dogs, barbeque, cheeseburgers, hamburgers, steak, bacon, sausage, cheese, etc.) and get more fresh fruits, vegetables, and whole grains Try to follow the DASH guidelines (DASH stands for Dietary Approaches to Stop Hypertension). Try to limit the sodium in your diet to no more than 1,500mg  of sodium per day. Certainly try to not exceed 2,000 mg per day at the very most. Do not add salt when cooking or at the table.  Check the sodium amount on labels when shopping, and choose items lower in sodium when given a choice. Avoid or limit foods that already contain a lot of sodium. Eat a diet rich in fruits and vegetables and whole grains, and try to lose weight if overweight or obese

## 2018-02-18 NOTE — Assessment & Plan Note (Signed)
Avoid NSAIDs; good water dirnker

## 2018-02-18 NOTE — Progress Notes (Signed)
BP 140/62   Pulse 60   Temp (!) 97.5 F (36.4 C) (Oral)   Resp 14   Ht 5\' 7"  (1.702 m)   Wt 156 lb 12.8 oz (71.1 kg)   SpO2 96%   BMI 24.56 kg/m    Subjective:    Patient ID: Samuel Lope., male    DOB: 04/19/1927, 84 y.o.   MRN: [id number]  HPI: Samuel Koller. is a 84 y.o. male  Chief Complaint  Patient presents with  . Follow-up    on labs    HPI Type 2 DM; checking sugars once a week and it runs 114, no low sugars; nothing over 200; eye visits are UTD; having cataract removed in [place] on 02/25/18  Feeling tired in the morning, but good energy the rest of the day; not sure if snoring; sleeping pretty well he thinks; eats breakfast  HTN; not checking BP at home, but could if needed  He has to wash his eyelids before the surgery, starting today daily until the surgery on April 30th; starting new eye drops just in the surgery one (one of them), another eye drop goes in both eyes  High cholesterol; trying to avoid bacon and sausage  Allergies have been okay for the most part, runny nose a week or two ago, always has it sometimes, has to blow nose when eating  CKD stage 3; seeing kidney doctor; not taking any NSAIDs; good water drinker  Carotid athero; had that done in Feb; already saw vasc surgeon for follow-up  Depression screen [facility] 2/9 01/23/2018 11/28/2017 08/19/2017 08/09/2017 02/06/2017  Decreased Interest 0 0 0 0 0  Down, Depressed, Hopeless 0 0 0 0 0  PHQ - 2 Score 0 0 0 0 0    Relevant past medical, surgical, family and social history reviewed Past Medical History:  Diagnosis Date  . Atherosclerosis of renal artery ([place])   . Essential hypertension, benign   . Occlusion and stenosis of carotid artery without mention of cerebral infarction    moderate left ICA stenosis  . Peripheral vascular disease, unspecified ([place])    mild lifestyle limiting claudication  . Pure hypercholesterolemia   . Renal insufficiency   . Type 2 diabetes mellitus ([place])      Past Surgical History:  Procedure Laterality Date  . APPENDECTOMY  1970's  . COLONOSCOPY    . coronary artery stenting  1999   at [facility]   . ENDARTERECTOMY Left 12/18/2017   Procedure: ENDARTERECTOMY CAROTID WITH PATCH;  Surgeon: Delana Meyer Dolores Lory, MD;  Location: [facility] ORS;  Service: Vascular;  Laterality: Left;  . FACIAL LACERATION REPAIR Left 12/13/2015   performed at [facility] by Dr. Marcial Pacas  . renal angiography     Family History  Problem Relation Age of Onset  . Diabetes Mother   . Heart disease Mother   . Hypertension Brother   . Hyperlipidemia Brother   . Diabetes Brother   . Hypertension Son   . Hypertension Son   . Birth defects Neg Hx   . Stroke Neg Hx    Social History   Tobacco Use  . Smoking status: Former Smoker    Types: Cigarettes    Last attempt to quit: 10/29/1970    Years since quitting: 47.3  . Smokeless tobacco: Never Used  Substance Use Topics  . Alcohol use: No    Alcohol/week: 0.0 oz  . Drug use: No    Interim medical history since last visit reviewed. Allergies and medications  reviewed  Review of Systems Per HPI unless specifically indicated above     Objective:    BP 140/62   Pulse 60   Temp (!) 97.5 F (36.4 C) (Oral)   Resp 14   Ht 5\' 7"  (1.702 m)   Wt 156 lb 12.8 oz (71.1 kg)   SpO2 96%   BMI 24.56 kg/m   Wt Readings from Last 3 Encounters:  02/18/18 156 lb 12.8 oz (71.1 kg)  01/23/18 156 lb (70.8 kg)  01/06/18 157 lb (71.2 kg)    Physical Exam  Constitutional: He appears well-developed and well-nourished. No distress.  HENT:  Head: Normocephalic and atraumatic.  Right Ear: External ear normal.  Left Ear: External ear normal.  Nose: Nose normal.  Mouth/Throat: No oropharyngeal exudate.  Eyes: Conjunctivae and EOM are normal. No scleral icterus.  Neck: Normal range of motion. No thyromegaly present.  Surgical scar along LEFT side of neck healing well without complications  Cardiovascular: Normal rate, regular rhythm  and normal heart sounds. Exam reveals no gallop and no friction rub.  No murmur heard. Pulmonary/Chest: Effort normal and breath sounds normal. No respiratory distress. He has no wheezes. He has no rales.  Abdominal: Soft. He exhibits no distension.  Musculoskeletal: Normal range of motion. He exhibits no edema.  Lymphadenopathy:    He has no cervical adenopathy.  Neurological: He is alert. He has normal reflexes.  Skin: Skin is warm and dry. No rash noted. He is not diaphoretic.  Psychiatric: He has a normal mood and affect. His behavior is normal. Judgment and thought content normal. His mood appears not anxious. He does not exhibit a depressed mood.   Diabetic Foot Form - Detailed   Diabetic Foot Exam - detailed Diabetic Foot exam was performed with the following findings:  Yes 02/18/2018  9:46 AM  Visual Foot Exam completed.:  Yes  Pulse Foot Exam completed.:  Yes  Right Dorsalis Pedis:  Present Left Dorsalis Pedis:  Present  Sensory Foot Exam Completed.:  Yes Semmes-Weinstein Monofilament Test R Site 1-Great Toe:  Pos L Site 1-Great Toe:  Pos        Results for orders placed or performed during the hospital encounter of 12/18/17  MRSA PCR Screening  Result Value Ref Range   MRSA by PCR NEGATIVE NEGATIVE  Glucose, capillary  Result Value Ref Range   Glucose-Capillary 110 (H) 65 - 99 mg/dL  Glucose, capillary  Result Value Ref Range   Glucose-Capillary 117 (H) 65 - 99 mg/dL  Glucose, capillary  Result Value Ref Range   Glucose-Capillary 109 (H) 65 - 99 mg/dL  CBC  Result Value Ref Range   WBC 7.4 3.8 - 10.6 K/uL   RBC 3.02 (L) 4.40 - 5.90 MIL/uL   Hemoglobin 9.7 (L) 13.0 - 18.0 g/dL   HCT 29.2 (L) 40.0 - 52.0 %   MCV 97.0 80.0 - 100.0 fL   MCH 32.3 26.0 - 34.0 pg   MCHC 33.3 32.0 - 36.0 g/dL   RDW 13.9 11.5 - 14.5 %   Platelets 155 150 - 440 K/uL  Basic metabolic panel  Result Value Ref Range   Sodium 141 135 - 145 mmol/L   Potassium 4.6 3.5 - 5.1 mmol/L    Chloride 110 101 - 111 mmol/L   CO2 23 22 - 32 mmol/L   Glucose, Bld 116 (H) 65 - 99 mg/dL   BUN 35 (H) 6 - 20 mg/dL   Creatinine, Ser 1.79 (H) 0.61 - 1.24 mg/dL  Calcium 8.2 (L) 8.9 - 10.3 mg/dL   GFR calc non Af Amer 32 (L) >60 mL/min   GFR calc Af Amer 37 (L) >60 mL/min   Anion gap 8 5 - 15  Glucose, capillary  Result Value Ref Range   Glucose-Capillary 132 (H) 65 - 99 mg/dL  Glucose, capillary  Result Value Ref Range   Glucose-Capillary 129 (H) 65 - 99 mg/dL  Magnesium  Result Value Ref Range   Magnesium 2.0 1.7 - 2.4 mg/dL  Phosphorus  Result Value Ref Range   Phosphorus 3.4 2.5 - 4.6 mg/dL  Glucose, capillary  Result Value Ref Range   Glucose-Capillary 90 65 - 99 mg/dL  ABO/Rh  Result Value Ref Range   ABO/RH(D)      B POS Performed at [facility], [address], [place], [place] [address]   Surgical pathology  Result Value Ref Range   SURGICAL PATHOLOGY      Surgical Pathology CASE: [id number] PATIENT: Rod Can Surgical Pathology Report     SPECIMEN SUBMITTED: A. Plaque, left carotid  CLINICAL HISTORY: None provided  PRE-OPERATIVE DIAGNOSIS: Carotid artery stenosis  POST-OPERATIVE DIAGNOSIS: Same as pre-op     DIAGNOSIS: A. PLAQUE, LEFT CAROTID; ENDARTERECTOMY: - CALCIFIED ATHEROMATOUS PLAQUE.   GROSS DESCRIPTION:  A. Labeled: Left carotid plaque  Tissue fragment(s): 2  Size: 3.5 x 1.5 x 0.5 cm, aggregate  Description: Irregular fragments of tan-yellow, indurated soft tissue. Sectioning displays multifocal areas of calcification. No additional abnormalities are grossly identified.  Representative sections are submitted in cassette 1, following decalcification.     Final Diagnosis performed by Quay Burow, MD.  Electronically signed 12/20/2017 2:23:59PM    The electronic signature indicates that the named Attending Pathologist has evaluated the specimen  Technical component perfo rmed at [facility], [address], [place], [place] [address] Lab: [phone] Dir: Rush Farmer, MD, MMM  Professional component performed at [facility], [facility], [place], [place], [place] [address] Lab: [phone] Dir: Dellia Nims. Rubinas, MD        Assessment & Plan:   Problem List Items Addressed This Visit      Cardiovascular and Mediastinum   Essential hypertension (Chronic)    Check BP at home in the morning to see if perhaps his AM fatigue is the result of hypotension; also check FSBS in the AM to see if that correlates; will have them also see if he snores; contact me with issues      Carotid arterial disease (HCC) (Chronic)    Stable after surgery; healing without complications        Endocrine   Type 2 diabetes mellitus ([place]) - Primary    Check A1c today; foot exam by MD; eye exam UTD; avoiding sugary drinks      Relevant Orders   Hemoglobin A1C   Lipid panel     Genitourinary   Chronic kidney disease, stage III (moderate) (HCC)    Avoid NSAIDs; good water dirnker        Other   Microalbuminuria    Following with nephrologist; avoiding NSAIDs; checking A1c      Medication monitoring encounter    Last renal function reviewed; following with nephrologist; last SGPT normal       Other Visit Diagnoses    Anemia, unspecified type       Hgb dropped from 12 to 9 after carotid surgery; will recheck this today to see if explaining am fatigue   Relevant Orders   CBC with  Differential/Platelet       Follow up plan: Return in about 6 months (around 08/20/2018) for follow-up visit with Dr. Sanda Klein.  An after-visit summary was printed and given to the patient at [place].  Please see the patient instructions which may contain other information and recommendations beyond what is mentioned above in the assessment and plan.  No orders of the defined types were placed in this encounter.   Orders Placed This Encounter  Procedures  . Hemoglobin A1C  . CBC  with Differential/Platelet  . Lipid panel

## 2018-02-18 NOTE — Assessment & Plan Note (Signed)
Last renal function reviewed; following with nephrologist; last SGPT normal

## 2018-02-19 LAB — HEMOGLOBIN A1C
HEMOGLOBIN A1C: 6.4 %{Hb} — AB (ref <5.7)
MEAN PLASMA GLUCOSE: 137 (calc)
eAG (mmol/L): 7.6 (calc)

## 2018-04-07 ENCOUNTER — Ambulatory Visit (INDEPENDENT_AMBULATORY_CARE_PROVIDER_SITE_OTHER): Payer: Medicare Other

## 2018-04-07 ENCOUNTER — Encounter (INDEPENDENT_AMBULATORY_CARE_PROVIDER_SITE_OTHER): Payer: Self-pay | Admitting: Vascular Surgery

## 2018-04-07 ENCOUNTER — Ambulatory Visit (INDEPENDENT_AMBULATORY_CARE_PROVIDER_SITE_OTHER): Payer: Medicare Other | Admitting: Vascular Surgery

## 2018-04-07 VITALS — BP 184/71 | HR 61 | Resp 12 | Ht 66.0 in | Wt 150.0 lb

## 2018-04-07 DIAGNOSIS — I10 Essential (primary) hypertension: Secondary | ICD-10-CM | POA: Diagnosis not present

## 2018-04-07 DIAGNOSIS — E1122 Type 2 diabetes mellitus with diabetic chronic kidney disease: Secondary | ICD-10-CM | POA: Diagnosis not present

## 2018-04-07 DIAGNOSIS — I739 Peripheral vascular disease, unspecified: Principal | ICD-10-CM

## 2018-04-07 DIAGNOSIS — I2510 Atherosclerotic heart disease of native coronary artery without angina pectoris: Secondary | ICD-10-CM

## 2018-04-07 DIAGNOSIS — E782 Mixed hyperlipidemia: Secondary | ICD-10-CM | POA: Diagnosis not present

## 2018-04-07 DIAGNOSIS — I779 Disorder of arteries and arterioles, unspecified: Secondary | ICD-10-CM

## 2018-04-07 DIAGNOSIS — N183 Chronic kidney disease, stage 3 (moderate): Secondary | ICD-10-CM | POA: Diagnosis not present

## 2018-04-07 NOTE — Progress Notes (Signed)
MRN : [id number]  Samuel Bryan. is a 84 y.o. (13-Apr-1927) male who presents with chief complaint of  Chief Complaint  Patient presents with  . Follow-up    3 month Carotid follow up  .  History of Present Illness:   The patient is seen for follow up evaluation of carotid stenosis status post left carotid endarterectomy on 12/18/2017.  There were no post operative problems or complications related to the surgery.  The patient denies neck or incisional pain.  The patient denies interval amaurosis fugax. There is no recent history of TIA symptoms or focal motor deficits. There is no prior documented CVA.  The patient denies headache.  The patient is taking enteric-coated aspirin 81 mg daily.  The patient is also seen for followup of PAD. There have been no interval changes in lower extremity symptoms, he notes that his legs get "tired" when he walks. No interval shortening of the patient's claudication distance or development of rest pain symptoms. No new ulcers or wounds have occurred since the last visit.  There have been no significant changes to the patient's overall health care.  The patient denies amaurosis fugax or recent TIA symptoms. There are no recent neurological changes noted. The patient denies history of DVT, PE or superficial thrombophlebitis. The patient denies recent episodes of angina or shortness of breath.   The patient has a history of coronary artery disease, no recent episodes of angina or shortness of breath. There is a history of hyperlipidemia which is being treated with a statin.     No outpatient medications have been marked as taking for the 04/07/18 encounter (Office Visit) with Delana Meyer, Dolores Lory, MD.    Past Medical History:  Diagnosis Date  . Atherosclerosis of renal artery ([place])   . Essential hypertension, benign   . Occlusion and stenosis of carotid artery without mention of cerebral infarction    moderate left ICA stenosis  . Peripheral  vascular disease, unspecified ([place])    mild lifestyle limiting claudication  . Pure hypercholesterolemia   . Renal insufficiency   . Type 2 diabetes mellitus ([place])     Past Surgical History:  Procedure Laterality Date  . APPENDECTOMY  1970's  . COLONOSCOPY    . coronary artery stenting  1999   at [facility]   . ENDARTERECTOMY Left 12/18/2017   Procedure: ENDARTERECTOMY CAROTID WITH PATCH;  Surgeon: Delana Meyer Dolores Lory, MD;  Location: [facility] ORS;  Service: Vascular;  Laterality: Left;  . FACIAL LACERATION REPAIR Left 12/13/2015   performed at [facility] by Dr. Marcial Pacas  . renal angiography      Social History Social History   Tobacco Use  . Smoking status: Former Smoker    Types: Cigarettes    Last attempt to quit: 10/29/1970    Years since quitting: 47.4  . Smokeless tobacco: Never Used  Substance Use Topics  . Alcohol use: No    Alcohol/week: 0.0 oz  . Drug use: No    Family History Family History  Problem Relation Age of Onset  . Diabetes Mother   . Heart disease Mother   . Hypertension Brother   . Hyperlipidemia Brother   . Diabetes Brother   . Hypertension Son   . Hypertension Son   . Birth defects Neg Hx   . Stroke Neg Hx     No Known Allergies   REVIEW OF SYSTEMS (Negative unless checked)  Constitutional: [] Weight loss  [] Fever  [] Chills Cardiac: [] Chest pain   [] Chest  pressure   [] Palpitations   [] Shortness of breath when laying flat   [] Shortness of breath with exertion. Vascular:  [x] Pain in legs with walking   [] Pain in legs at rest  [] History of DVT   [] Phlebitis   [] Swelling in legs   [] Varicose veins   [] Non-healing ulcers Pulmonary:   [] Uses home oxygen   [] Productive cough   [] Hemoptysis   [] Wheeze  [] COPD   [] Asthma Neurologic:  [] Dizziness   [] Seizures   [] History of stroke   [] History of TIA  [] Aphasia   [] Vissual changes   [] Weakness or numbness in arm   [] Weakness or numbness in leg Musculoskeletal:   [] Joint swelling   [] Joint pain   [] Low back  pain Hematologic:  [] Easy bruising  [] Easy bleeding   [] Hypercoagulable state   [] Anemic Gastrointestinal:  [] Diarrhea   [] Vomiting  [] Gastroesophageal reflux/heartburn   [] Difficulty swallowing. Genitourinary:  [] Chronic kidney disease   [] Difficult urination  [] Frequent urination   [] Blood in urine Skin:  [] Rashes   [] Ulcers  Psychological:  [] History of anxiety   []  History of major depression.  Physical Examination  There were no vitals filed for this visit. There is no height or weight on file to calculate BMI. Gen: WD/WN, NAD Head: [place]/AT, No temporalis wasting.  Ear/Nose/Throat: Hearing grossly intact, nares w/o erythema or drainage Eyes: PER, EOMI, sclera nonicteric.  Neck: Supple, no large masses.   Pulmonary:  Good air movement, no audible wheezing bilaterally, no use of accessory muscles.  Cardiac: RRR, no JVD Vascular: bilateral carotid bruits  Well healed left CEA incisional scar scattered varicosities present bilaterally.  Mild venous stasis changes to the legs bilaterally.  2+ soft pitting edema Vessel Right Left  Radial Palpable Palpable  Brachial Palpable Palpable  Carotid Palpable Palpable  PT Not Palpable Not Palpable  DP Not Palpable Not Palpable  Gastrointestinal: Non-distended. No guarding/no peritoneal signs.  Musculoskeletal: M/S 5/5 throughout.  No deformity or atrophy.  Neurologic: CN 2-12 intact. Symmetrical.  Speech is fluent. Motor exam as listed above. Psychiatric: Judgment intact, Mood & affect appropriate for pt's clinical situation. Dermatologic: No rashes or ulcers noted.  No changes consistent with cellulitis. Lymph : No lichenification or skin changes of chronic lymphedema.  CBC Lab Results  Component Value Date   WBC 3.6 (L) 02/18/2018   HGB 11.4 (L) 02/18/2018   HCT 35.0 (L) 02/18/2018   MCV 95.6 02/18/2018   PLT 203 02/18/2018    BMET    Component Value Date/Time   NA 141 12/19/2017 0450   NA 137 04/09/2016 1008   NA 137 04/26/2012  1145   K 4.6 12/19/2017 0450   K 4.0 04/26/2012 1145   CL 110 12/19/2017 0450   CL 104 04/26/2012 1145   CO2 23 12/19/2017 0450   CO2 29 04/26/2012 1145   GLUCOSE 116 (H) 12/19/2017 0450   GLUCOSE 105 (H) 04/26/2012 1145   BUN 35 (H) 12/19/2017 0450   BUN 19 04/09/2016 1008   BUN 16 04/26/2012 1145   CREATININE 1.79 (H) 12/19/2017 0450   CREATININE 1.63 (H) 08/09/2017 1202   CALCIUM 8.2 (L) 12/19/2017 0450   CALCIUM 8.9 04/26/2012 1145   GFRNONAA 32 (L) 12/19/2017 0450   GFRNONAA 37 (L) 08/09/2017 1202   GFRAA 37 (L) 12/19/2017 0450   GFRAA 43 (L) 08/09/2017 1202   CrCl cannot be calculated (Patient's most recent lab result is older than the maximum 21 days allowed.).  COAG Lab Results  Component Value Date  INR 1.06 12/10/2017    Radiology No results found.   Assessment/Plan  1. Bilateral carotid artery disease, unspecified type ([place]) Recommend:  Given the patient's asymptomatic subcritical stenosis no further invasive testing or surgery at this time.  Duplex ultrasound shows <30% stenosis bilaterally.  He is s/p successful left CEA  Continue antiplatelet therapy as prescribed Continue management of CAD, HTN and Hyperlipidemia Healthy heart diet,  encouraged exercise at least 4 times per week Follow up in 12 months with duplex ultrasound and physical exam   - VAS US CAROTID; Future  2. Peripheral vascular disease ([place])  Recommend:  The patient has evidence of atherosclerosis of the lower extremities with claudication.  The patient does not voice lifestyle limiting changes at this point in time.  Noninvasive studies do not suggest clinically significant change.  No invasive studies, angiography or surgery at this time The patient should continue walking and begin a more formal exercise program.  The patient should continue antiplatelet therapy and aggressive treatment of the lipid abnormalities  No changes in the patient's medications at this time  The  patient should continue wearing graduated compression socks 10-15 mmHg strength to control the mild edema.    3. Atherosclerosis of native coronary artery of native heart without angina pectoris Continue cardiac and antihypertensive medications as already ordered and reviewed, no changes at this time.  Continue statin as ordered and reviewed, no changes at this time  Nitrates PRN for chest pain   4. Essential hypertension Continue antihypertensive medications as already ordered, these medications have been reviewed and there are no changes at this time.   5. Mixed hyperlipidemia Continue statin as ordered and reviewed, no changes at this time   6. Type 2 diabetes mellitus with stage 3 chronic kidney disease, without long-term current use of insulin (HCC) Continue hypoglycemic medications as already ordered, these medications have been reviewed and there are no changes at this time.  Hgb A1C to be monitored as already arranged by primary service    Hortencia Pilar, MD  04/07/2018 9:46 AM

## 2018-04-17 ENCOUNTER — Telehealth (INDEPENDENT_AMBULATORY_CARE_PROVIDER_SITE_OTHER): Payer: Self-pay

## 2018-04-17 NOTE — Telephone Encounter (Signed)
Called the patient's wife back to let her know that she must contact their primary doctor in order to get the ball rolling on seeing about how to apply for disability for this patient.

## 2018-04-18 ENCOUNTER — Telehealth: Payer: Self-pay

## 2018-04-18 DIAGNOSIS — Z0271 Encounter for disability determination: Secondary | ICD-10-CM

## 2018-04-18 NOTE — Telephone Encounter (Signed)
Miel, would you mind calling and seeing if this is something we get involved with? I've never tried to help an 84 year-old patient get disability. Do they need a C3 care team referral or perhaps an appointment? Thank you

## 2018-04-18 NOTE — Telephone Encounter (Signed)
Copied from [place] [id number]. Topic: Quick Communication - See Telephone Encounter >> Apr 17, 2018  3:14 PM Antonieta Iba C wrote: CRM for notification. See Telephone encounter for: 04/17/18.  Pt's daughter called in to request help, she said that they are trying to get pt set up on disability. She is requesting a call back   [phone] -

## 2018-04-21 NOTE — Addendum Note (Signed)
Addended by: Misao Fackrell, Satira Anis on: 04/21/2018 09:40 AM   Modules accepted: Orders

## 2018-04-21 NOTE — Telephone Encounter (Signed)
c3 referral entered

## 2018-05-23 ENCOUNTER — Telehealth: Payer: Self-pay

## 2018-05-23 MED ORDER — PIOGLITAZONE HCL 15 MG PO TABS: 15.0000 mg | ORAL_TABLET | Freq: Every day | ORAL | 2 refills | Status: DC

## 2018-05-23 NOTE — Telephone Encounter (Signed)
If theTradjenta is too expensive, we can use [employer] Caution her and let her know that a possible side effect is heart failure If he gets [facility], gains weight, gets swelling in his legs, etc., then stop it and seek help right away

## 2018-05-23 NOTE — Telephone Encounter (Signed)
Daughter wants to know if there is anything else besides the tradjenta, it is to [employer]

## 2018-05-23 NOTE — Telephone Encounter (Signed)
Left detailed voicemail

## 2018-05-27 MED ORDER — LINAGLIPTIN 5 MG PO TABS: 5.0000 mg | ORAL_TABLET | Freq: Every day | ORAL | 3 refills | Status: DC

## 2018-05-27 NOTE — Telephone Encounter (Signed)
Noted, thank you I updated the med list and sent a new prescription to the pharmacy If the medicine is too expensive, encourage her to get online and see if he might qualify for patient assistance  [email or web address]

## 2018-05-27 NOTE — Addendum Note (Signed)
Addended by: LADA, Satira Anis on: 05/27/2018 09:39 AM   Modules accepted: Orders

## 2018-05-27 NOTE — Telephone Encounter (Signed)
Patient's daughter [name] calling to notify Dr. Sanda Klein that the patient will go ahead and remain on Tradjenta. She would like a call back to confirm this has been noted.

## 2018-07-25 ENCOUNTER — Emergency Department
Admission: EM | Admit: 2018-07-25 | Discharge: 2018-07-25 | Disposition: A | Discharge status: Home / Self Care | Payer: Medicare Other | Attending: Emergency Medicine | Admitting: Emergency Medicine

## 2018-07-25 ENCOUNTER — Emergency Department: Payer: Medicare Other

## 2018-07-25 ENCOUNTER — Other Ambulatory Visit: Payer: Self-pay

## 2018-07-25 ENCOUNTER — Encounter: Payer: Self-pay | Admitting: Emergency Medicine

## 2018-07-25 ENCOUNTER — Ambulatory Visit (INDEPENDENT_AMBULATORY_CARE_PROVIDER_SITE_OTHER): Payer: Medicare Other | Admitting: Family Medicine

## 2018-07-25 ENCOUNTER — Encounter: Payer: Self-pay | Admitting: Family Medicine

## 2018-07-25 VITALS — BP 118/84 | HR 64 | Temp 98.4°F | Resp 16 | Ht 66.0 in | Wt 132.0 lb

## 2018-07-25 DIAGNOSIS — N183 Chronic kidney disease, stage 3 (moderate): Secondary | ICD-10-CM | POA: Insufficient documentation

## 2018-07-25 DIAGNOSIS — I700 Atherosclerosis of aorta: Secondary | ICD-10-CM | POA: Diagnosis not present

## 2018-07-25 DIAGNOSIS — Z7982 Long term (current) use of aspirin: Secondary | ICD-10-CM | POA: Insufficient documentation

## 2018-07-25 DIAGNOSIS — K529 Noninfective gastroenteritis and colitis, unspecified: Secondary | ICD-10-CM | POA: Insufficient documentation

## 2018-07-25 DIAGNOSIS — Z87891 Personal history of nicotine dependence: Secondary | ICD-10-CM | POA: Insufficient documentation

## 2018-07-25 DIAGNOSIS — Z79899 Other long term (current) drug therapy: Secondary | ICD-10-CM | POA: Insufficient documentation

## 2018-07-25 DIAGNOSIS — R634 Abnormal weight loss: Secondary | ICD-10-CM

## 2018-07-25 DIAGNOSIS — R531 Weakness: Secondary | ICD-10-CM | POA: Diagnosis not present

## 2018-07-25 DIAGNOSIS — R1937 Generalized abdominal rigidity: Secondary | ICD-10-CM | POA: Diagnosis not present

## 2018-07-25 DIAGNOSIS — E1122 Type 2 diabetes mellitus with diabetic chronic kidney disease: Secondary | ICD-10-CM | POA: Diagnosis not present

## 2018-07-25 DIAGNOSIS — I129 Hypertensive chronic kidney disease with stage 1 through stage 4 chronic kidney disease, or unspecified chronic kidney disease: Secondary | ICD-10-CM | POA: Insufficient documentation

## 2018-07-25 DIAGNOSIS — R1084 Generalized abdominal pain: Secondary | ICD-10-CM | POA: Diagnosis present

## 2018-07-25 LAB — COMPREHENSIVE METABOLIC PANEL
ALT: 12 U/L (ref 0–44)
AST: 22 U/L (ref 15–41)
Albumin: 3.8 g/dL (ref 3.5–5.0)
Alkaline Phosphatase: 50 U/L (ref 38–126)
Anion gap: 9 (ref 5–15)
BUN: 34 mg/dL — ABNORMAL HIGH (ref 8–23)
CHLORIDE: 102 mmol/L (ref 98–111)
CO2: 27 mmol/L (ref 22–32)
CREATININE: 1.94 mg/dL — AB (ref 0.61–1.24)
Calcium: 9.2 mg/dL (ref 8.9–10.3)
GFR calc non Af Amer: 29 mL/min — ABNORMAL LOW (ref >60)
GFR, EST AFRICAN AMERICAN: 34 mL/min — AB (ref >60)
Glucose, Bld: 169 mg/dL — ABNORMAL HIGH (ref 70–99)
Potassium: 4.3 mmol/L (ref 3.5–5.1)
SODIUM: 138 mmol/L (ref 135–145)
Total Bilirubin: 1.3 mg/dL — ABNORMAL HIGH (ref 0.3–1.2)
Total Protein: 7.4 g/dL (ref 6.5–8.1)

## 2018-07-25 LAB — URINALYSIS, COMPLETE (UACMP) WITH MICROSCOPIC
BACTERIA UA: NONE SEEN
BILIRUBIN URINE: NEGATIVE
Glucose, UA: NEGATIVE mg/dL
Ketones, ur: NEGATIVE mg/dL
Leukocytes, UA: NEGATIVE
Nitrite: NEGATIVE
PROTEIN: NEGATIVE mg/dL
SQUAMOUS EPITHELIAL / LPF: NONE SEEN (ref 0–5)
Specific Gravity, Urine: 1.012 (ref 1.005–1.030)
pH: 5 (ref 5.0–8.0)

## 2018-07-25 LAB — CBC
HCT: 33 % — ABNORMAL LOW (ref 40.0–52.0)
Hemoglobin: 11.2 g/dL — ABNORMAL LOW (ref 13.0–18.0)
MCH: 32.8 pg (ref 26.0–34.0)
MCHC: 33.9 g/dL (ref 32.0–36.0)
MCV: 96.6 fL (ref 80.0–100.0)
Platelets: 220 10*3/uL (ref 150–440)
RBC: 3.42 MIL/uL — AB (ref 4.40–5.90)
RDW: 13.8 % (ref 11.5–14.5)
WBC: 10.8 10*3/uL — ABNORMAL HIGH (ref 3.8–10.6)

## 2018-07-25 LAB — LIPASE, BLOOD: LIPASE: 32 U/L (ref 11–51)

## 2018-07-25 LAB — LACTIC ACID, PLASMA: LACTIC ACID, VENOUS: 1 mmol/L (ref 0.5–1.9)

## 2018-07-25 MED ORDER — AMOXICILLIN-POT CLAVULANATE 875-125 MG PO TABS: 1.0000 | ORAL_TABLET | Freq: Two times a day (BID) | ORAL | 0 refills | Status: AC

## 2018-07-25 MED ORDER — SODIUM CHLORIDE 0.9 % IV BOLUS
500.0000 mL | Freq: Once | INTRAVENOUS | Status: AC
  Administered 2018-07-25: 500 mL via INTRAVENOUS

## 2018-07-25 MED ORDER — AMOXICILLIN-POT CLAVULANATE 875-125 MG PO TABS
1.0000 | ORAL_TABLET | Freq: Once | ORAL | Status: AC
  Administered 2018-07-25: 1 via ORAL
  Filled 2018-07-25: qty 1

## 2018-07-25 MED ORDER — AMOXICILLIN-POT CLAVULANATE 875-125 MG PO TABS: 1.0000 | ORAL_TABLET | Freq: Two times a day (BID) | ORAL | 0 refills | Status: DC

## 2018-07-25 MED ORDER — IOHEXOL 350 MG/ML SOLN
75.0000 mL | Freq: Once | INTRAVENOUS | Status: AC | PRN
  Administered 2018-07-25: 75 mL via INTRAVENOUS

## 2018-07-25 NOTE — Discharge Instructions (Signed)
? ?  Please return to the emergency room right away if you are to develop a fever, severe nausea, your pain becomes severe or worsens, you are unable to keep food down, begin vomiting any dark or bloody fluid, you develop any dark or bloody stools, feel dehydrated, or other new concerns or symptoms arise. ? ?

## 2018-07-25 NOTE — ED Triage Notes (Signed)
Pt comes into the ED via POV c/o abdominal pain that resolves after eating.  Patient was seen at [facility] who sent him over here due to feeling a hard mass in his stomach on palpation.  Patient states he has nausea and vomiting but denies any diarrhea. Patient explains that the stomach pain is generalized throughout the stomach.  Patient has even and unlabored respirations at this time and is in NAD. Family and patient denies any known h/o stomach ulcers.

## 2018-07-25 NOTE — ED Provider Notes (Signed)
[facility] Emergency Department Provider Note  ____________________________________________   First MD Initiated Contact with Patient 07/25/18 1819     (approximate)  I have reviewed the triage vital signs and the nursing notes.  HISTORY  Chief Complaint Abdominal Pain  HPI Samuel Jamesmichael Shadd. is a 84 y.o. male here for evaluation of abdominal pain  Patient reports about 3 days ago he started experience abdominal pain "all over" and for the next 2 days been experiencing pain had one episode of diarrhea, but cannot really describe it otherwise.  Denies nausea and vomiting.  No chest pain no shortness of breath.  No fevers or chills no back pain.  Saw his primary care today, and he reports his pain is gone now but they felt some type of a mass and referred him here for further follow-up.  Currently no pain no nausea vomiting.  Drinking slightly less fluids over the last day according to his son and he did complain of abdominal pain yesterday but seems to be better now.     Past Medical History:  Diagnosis Date  . Atherosclerosis of renal artery ([place])   . Essential hypertension, benign   . Occlusion and stenosis of carotid artery without mention of cerebral infarction    moderate left ICA stenosis  . Peripheral vascular disease, unspecified ([place])    mild lifestyle limiting claudication  . Pure hypercholesterolemia   . Renal insufficiency   . Type 2 diabetes mellitus [facility])     Patient Active Problem List   Diagnosis Date Noted  . Full code status 01/23/2018  . Memory loss 01/23/2018  . Carotid stenosis, asymptomatic, left 12/18/2017  . Degenerative arthritis of left shoulder region 08/19/2017  . Microalbuminuria 08/19/2017  . On angiotensin receptor blockers (ARB) 08/19/2017  . Type 2 diabetes mellitus ([place]) 08/13/2017  . Preventative health care 12/11/2016  . Abdominal mass 04/11/2016  . Abdominal mass, right upper quadrant 04/03/2016  .  Bilateral carotid bruits 04/03/2016  . Medication monitoring encounter 04/03/2016  . Facial injury 10/26/2015  . 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 Arteriosclerosis of coronary artery 10/11/2015  . H/O angina pectoris 10/11/2015  . Hyperlipidemia 09/13/2015  . Chronic kidney disease, stage III (moderate) ([place]) 09/13/2015  . RENAL ATHEROSCLEROSIS 03/16/2009  . Essential hypertension 03/15/2009  . Coronary atherosclerosis 03/15/2009  . Carotid arterial disease ([place]) 03/15/2009  . Peripheral vascular disease ([place]) 03/15/2009  . Anemia in chronic kidney disease (CKD) 03/15/2009    Past Surgical History:  Procedure Laterality Date  . APPENDECTOMY  1970's  . COLONOSCOPY    . coronary artery stenting  1999   at [facility]   . ENDARTERECTOMY Left 12/18/2017   Procedure: ENDARTERECTOMY CAROTID WITH PATCH;  Surgeon: Delana Meyer Dolores Lory, MD;  Location: [facility] ORS;  Service: Vascular;  Laterality: Left;  . FACIAL LACERATION REPAIR Left 12/13/2015   performed at [facility] by Dr. Marcial Pacas  . renal angiography      Prior to Admission medications   Medication Sig Start Date End Date Taking? Authorizing Provider  amLODipine (NORVASC) 5 MG tablet Take 1 tablet (5 mg total) by mouth daily. 11/28/17   Arnetha Courser, MD  amoxicillin-clavulanate (AUGMENTIN) 875-125 MG tablet Take 1 tablet by mouth 2 (two) times daily for 7 days. 07/25/18 08/01/18  Delman Kitten, MD  aspirin EC 81 MG tablet Take 81 mg by mouth daily.     [provider]  atorvastatin (LIPITOR) 40 MG tablet Take 1 tablet (40 mg total)  by mouth at bedtime. 11/28/17   Arnetha Courser, MD  brimonidine (ALPHAGAN) 0.2 % ophthalmic solution 1 drop Three (3) times a day. Patient taking differently: Place 1 drop into both eyes at bedtime.  11/11/15   Ashok Norris, MD  furosemide (LASIX) 40 MG tablet Take 40 mg by mouth daily. 07/10/16   [provider]  isosorbide mononitrate (IMDUR) 30 MG 24 hr tablet Take 1 tablet (30 mg total) by  mouth daily. 08/19/17   Arnetha Courser, MD  ketorolac (ACULAR) 0.5 % ophthalmic solution  03/21/18   [provider]  latanoprost (XALATAN) 0.005 % ophthalmic solution Place 1 drop into both eyes at bedtime.    [provider]  linagliptin (TRADJENTA) 5 MG TABS tablet Take 1 tablet (5 mg total) by mouth daily. For diabetes 05/27/18   Arnetha Courser, MD  losartan (COZAAR) 100 MG tablet Take 100 mg by mouth daily. 07/10/16   [provider]  nitroGLYCERIN (NITROSTAT) 0.4 MG SL tablet Place 1 tablet (0.4 mg total) under the tongue every 5 (five) minutes as needed for chest pain. Max of 3 pills; call 911 08/09/17   Arnetha Courser, MD  ofloxacin (OCUFLOX) 0.3 % ophthalmic solution  03/01/18   [provider]  Omega-3 Fatty Acids (FISH OIL) 1000 MG CAPS Take 1,000 mg by mouth daily.     [provider]  oxymetazoline (QC NASAL RELIEF MOISTURIZING) 0.05 % nasal spray Place 1 spray into both nostrils 2 (two) times daily as needed for congestion. QC Nasal Spray    [provider]  Polyethyl Glycol-Propyl Glycol (SYSTANE) 0.4-0.3 % SOLN Place 1 drop into both eyes at bedtime.    [provider]  prednisoLONE acetate (PRED FORTE) 1 % ophthalmic suspension  03/01/18   [provider]  sodium chloride (DEEP SEA NASAL SPRAY) 0.65 % nasal spray Place 1-2 sprays into the nose 4 (four) times daily as needed for congestion.    [provider]  sodium chloride (OCEAN) 0.65 % SOLN nasal spray Place 1 spray into both nostrils as needed for congestion. 09/23/17   Hubbard Hartshorn, FNP  timolol (BETIMOL) 0.5 % ophthalmic solution Place 1 drop into both eyes 2 (two) times daily. Patient taking differently: Place 1 drop into both eyes at bedtime.  11/11/15   Ashok Norris, MD    Allergies Patient has no known allergies.  Family History  Problem Relation Age of Onset  . Diabetes Mother   . Heart disease Mother   . Hypertension Brother   .  Hyperlipidemia Brother   . Diabetes Brother   . Hypertension Son   . Hypertension Son   . Birth defects Neg Hx   . Stroke Neg Hx     Social History Social History   Tobacco Use  . Smoking status: Former Smoker    Types: Cigarettes    Last attempt to quit: 10/29/1970    Years since quitting: 47.7  . Smokeless tobacco: Never Used  Substance Use Topics  . Alcohol use: No    Alcohol/week: 0.0 standard drinks  . Drug use: No    Review of Systems Constitutional: No fever/chills Eyes: No visual changes. ENT: No sore throat. Cardiovascular: Denies chest pain. Respiratory: Denies shortness of breath. Gastrointestinal: See HPI Genitourinary: Negative for dysuria. Musculoskeletal: Negative for back pain. Skin: Negative for rash. Neurological: Negative for headaches, areas of focal weakness or numbness.    ____________________________________________   PHYSICAL EXAM:  VITAL SIGNS: ED Triage Vitals [  07/25/18 1630]  Enc Vitals Group     BP (!) 99/50     Pulse Rate 88     Resp 17     Temp 98.1 F (36.7 C)     Temp Source Oral     SpO2 97 %     Weight 131 lb 13.4 oz (59.8 kg)     Height 5\' 6"  (1.676 m)     Head Circumference      Peak Flow      Pain Score 0     Pain Loc      Pain Edu?      Excl. in [place]?     Constitutional: Alert and oriented. Well appearing and in no acute distress.  Very pleasant along with his family were both very pleasant as well. Eyes: Conjunctivae are normal. Head: Atraumatic. Nose: No congestion/rhinnorhea. Mouth/Throat: Mucous membranes are moist. Neck: No stridor.  Cardiovascular: Normal rate, regular rhythm. Grossly normal heart sounds.  Good peripheral circulation. Respiratory: Normal respiratory effort.  No retractions. Lungs CTAB. Gastrointestinal: Soft and nontender. No distention.  There is no rebound or guarding any quadrant.  No peritonitis.  Auscultation bowel sounds are heard, but also an abdominal bruit is  appreciable Musculoskeletal: No lower extremity tenderness nor edema. Neurologic:  Normal speech and language. No gross focal neurologic deficits are appreciated.  Skin:  Skin is warm, dry and intact. No rash noted. Psychiatric: Mood and affect are normal. Speech and behavior are normal.  ____________________________________________   LABS (all labs ordered are listed, but only abnormal results are displayed)  Labs Reviewed  COMPREHENSIVE METABOLIC PANEL - Abnormal; Notable for the following components:      Result Value   Glucose, Bld 169 (*)    BUN 34 (*)    Creatinine, Ser 1.94 (*)    Total Bilirubin 1.3 (*)    GFR calc non Af Amer 29 (*)    GFR calc Af Amer 34 (*)    All other components within normal limits  CBC - Abnormal; Notable for the following components:   WBC 10.8 (*)    RBC 3.42 (*)    Hemoglobin 11.2 (*)    HCT 33.0 (*)    All other components within normal limits  URINALYSIS, COMPLETE (UACMP) WITH MICROSCOPIC - Abnormal; Notable for the following components:   Color, Urine YELLOW (*)    APPearance CLEAR (*)    Hgb urine dipstick SMALL (*)    All other components within normal limits  LIPASE, BLOOD  LACTIC ACID, PLASMA   ____________________________________________  EKG  Reviewed and interpreted at 1640 Heart rate 80 QRS 80 QTc 460 Normal sinus rhythm occasional PVC.  No evidence of ischemia ____________________________________________  RADIOLOGY  Ct Angio Abd/pel W And/or Wo Contrast  Result Date: 07/25/2018 CLINICAL DATA:  Acute generalized abdominal pain. EXAM: CTA ABDOMEN AND PELVIS WITHOUT AND WITH CONTRAST TECHNIQUE: Multidetector CT imaging of the abdomen and pelvis was performed using the standard protocol during bolus administration of intravenous contrast. Multiplanar reconstructed images and MIPs were obtained and reviewed to evaluate the vascular anatomy. CONTRAST:  46mL OMNIPAQUE IOHEXOL 350 MG/ML SOLN COMPARISON:  CT scan of April 19, 2016. FINDINGS: VASCULAR Aorta: Atherosclerosis of abdominal aorta is noted without aneurysm or dissection. 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 SMA: Occlusion at its origin secondary to calcified plaque. Reconstitution of its more distal portion is noted through collateral branches. Renals: Severe stenosis is noted  at the origins of both renal arteries secondary to heavily calcified plaque. IMA: Patent without evidence of aneurysm, dissection, vasculitis or significant stenosis. Inflow: Severe stenosis is noted at the origin of the left common iliac artery secondary to calcified plaque. Severe stenosis is noted at the origin of the right common iliac artery secondary to calcified plaque. Proximal Outflow: Severe stenosis is noted in right common femoral artery secondary to heavily calcified eccentric plaque. Moderate stenosis is noted in left common femoral artery secondary to heavily calcified eccentric plaque. Veins: No obvious venous abnormality within the limitations of this arterial phase study. Review of the MIP images confirms the above findings. NON-VASCULAR Lower chest: No acute abnormality. Hepatobiliary: Cholelithiasis is noted. Liver is unremarkable. No biliary dilatation is noted. Pancreas: 7 mm rounded low density is seen in pancreatic body. Pancreatic ductal dilatation is noted measuring 6 mm. No other abnormality seen. Spleen: Normal in size without focal abnormality. Adrenals/Urinary Tract: Adrenal glands are unremarkable. Kidneys are normal, without renal calculi, focal lesion, or hydronephrosis. Bladder is unremarkable. Stomach/Bowel: The stomach is unremarkable. The appendix is not visualized. There is no evidence of bowel obstruction. Severe wall thickening of right colon is noted suggesting infectious or inflammatory colitis. The appendix is not clearly visualized. Lymphatic: No significant adenopathy is noted. Reproductive:  Prostate is unremarkable. Other: No abdominal wall hernia or abnormality. No abdominopelvic ascites. Musculoskeletal: No acute or significant osseous findings. 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 Atherosclerosis of abdominal aorta is noted without aneurysm or dissection. Severe stenoses are noted at the origins of both common iliac arteries secondary to calcified plaque. NON-VASCULAR Findings consistent with infectious or inflammatory colitis of the cecum. Cholelithiasis. 7 mm low density seen in pancreatic body. Recommend follow up pre and post contrast MRI/MRCP or pancreatic protocol CT in 2 years. This recommendation follows ACR consensus guidelines: Management of Incidental Pancreatic Cysts: A White Paper of the ACR Incidental Findings Committee. [place] 3267;12:[id number]. Electronically Signed   By: Marijo Conception, M.D.   On: 07/25/2018 19:49      Case and care, clinical history and symptoms as well as CT findings discussed with vascular surgery Dr. Hillery Hunter who advises the patient may be discharged if able to take by mouth and pain is resolved.  On the time of presentation the pain has resolved and at this time the patient is able to take by mouth is eaten a sandwich and denies any ongoing pain.  Vascular surgery recommends follow-up with primary care and with vascular surgery as an outpatient. ____________________________________________   PROCEDURES  Procedure(s) performed: None  Procedures  Critical Care performed: No  ____________________________________________   INITIAL IMPRESSION / ASSESSMENT AND PLAN / ED COURSE  Pertinent labs & imaging results that were available during my care of the patient were  reviewed by me and considered in my medical decision making (see chart for details).   Differential diagnosis includes but is not limited to, abdominal perforation, aortic dissection, cholecystitis, appendicitis, diverticulitis, colitis, esophagitis/gastritis, kidney stone, pyelonephritis, urinary tract infection, aortic aneurysm. All are considered in decision and treatment plan. Based upon the patient's presentation and risk factors, I am somewhat reassured and that his pain is improved and his vital signs are normal at the time of my evaluation,  however on exam he does have a appreciable abdominal bruit raising my suspicion for vascular etiology thus I will proceed with CT angiography to further evaluate.  Did discuss with his and his family risks and benefits of CT including the low but not impossible risk of kidney injury from the IV contrast.    After CT scan, patient given a meal which she tolerated well without any ongoing symptoms or pain.  On repeat exam abdomen soft nontender no complaints of any pain.  No ongoing symptoms.  Given his colitis, it appears he has collateral flow around the SMA and have discussed CT findings with vascular surgery who advised outpatient follow-up.  Discussed with patient his family very careful return precautions, given the unclear etiology of his colitis today we will also treat with Augmentin in the event infectious etiology is suspect.  Careful return precautions advised.  Appears appropriate for close outpatient follow-up.  Return precautions and treatment recommendations and follow-up discussed with the patient who is agreeable with the plan.      ____________________________________________   FINAL CLINICAL IMPRESSION(S) / ED DIAGNOSES  Final diagnoses:  Colitis  Atherosclerosis of abdominal aorta ([place])        Note:  This document was prepared using Dragon voice recognition software and may include unintentional dictation errors        Delman Kitten, MD 07/25/18 2110

## 2018-07-25 NOTE — Progress Notes (Signed)
Name: Samuel [facility].   MRN: [id number]    DOB: 1927-09-23   Date:07/25/2018       Progress Note  Subjective  Chief Complaint  Chief Complaint  Patient presents with  . Abdominal Pain    weak, no appetite for 3 days    HPI  Pt presents with his two sons who assist with the history.  He has complaint of 2 days of intermittent abdominal pain. He endorses ongoing constipation prior to this abdominal pain.  Has had 1 episode of diarrhea yesterday - no blood in stool, no BM today.  Denies N/V, fevers/chills, hematuria, dysuria. +Generalized weakness for about 3 days. Remote hx appendectomy; no history of abdominal issues otherwise.  Pain is intermittent, but does not come after he eats.  He notes decreased appetite for about 2 weeks. Has lost 18lbs since June 2019  Patient Active Problem List   Diagnosis Date Noted  . Full code status 01/23/2018  . Memory loss 01/23/2018  . Carotid stenosis, asymptomatic, left 12/18/2017  . Degenerative arthritis of left shoulder region 08/19/2017  . Microalbuminuria 08/19/2017  . On angiotensin receptor blockers (ARB) 08/19/2017  . Type 2 diabetes mellitus ([place]) 08/13/2017  . Preventative health care 12/11/2016  . Abdominal mass 04/11/2016  . Abdominal mass, right upper quadrant 04/03/2016  . Bilateral carotid bruits 04/03/2016  . Medication monitoring encounter 04/03/2016  . Facial injury 10/26/2015  . 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 Arteriosclerosis of coronary artery 10/11/2015  . H/O angina pectoris 10/11/2015  . Hyperlipidemia 09/13/2015  . Chronic kidney disease, stage III (moderate) ([place]) 09/13/2015  . RENAL ATHEROSCLEROSIS 03/16/2009  . Essential hypertension 03/15/2009  . Coronary atherosclerosis 03/15/2009  . Carotid arterial disease ([place]) 03/15/2009  . Peripheral vascular disease ([place]) 03/15/2009  . Anemia in chronic kidney disease (CKD) 03/15/2009    Social History   Tobacco Use  . Smoking status: Former Smoker   Types: Cigarettes    Last attempt to quit: 10/29/1970    Years since quitting: 47.7  . Smokeless tobacco: Never Used  Substance Use Topics  . Alcohol use: No    Alcohol/week: 0.0 standard drinks     Current Outpatient Medications:  .  amLODipine (NORVASC) 5 MG tablet, Take 1 tablet (5 mg total) by mouth daily., Disp: 90 tablet, Rfl: 3 .  aspirin EC 81 MG tablet, Take 81 mg by mouth daily. , Disp: , Rfl:  .  atorvastatin (LIPITOR) 40 MG tablet, Take 1 tablet (40 mg total) by mouth at bedtime., Disp: 90 tablet, Rfl: 3 .  brimonidine (ALPHAGAN) 0.2 % ophthalmic solution, 1 drop Three (3) times a day. (Patient taking differently: Place 1 drop into both eyes at bedtime. ), Disp: 5 mL, Rfl: 2 .  furosemide (LASIX) 40 MG tablet, Take 40 mg by mouth daily., Disp: , Rfl:  .  isosorbide mononitrate (IMDUR) 30 MG 24 hr tablet, Take 1 tablet (30 mg total) by mouth daily., Disp: , Rfl:  .  ketorolac (ACULAR) 0.5 % ophthalmic solution, , Disp: , Rfl:  .  latanoprost (XALATAN) 0.005 % ophthalmic solution, Place 1 drop into both eyes at bedtime., Disp: , Rfl:  .  linagliptin (TRADJENTA) 5 MG TABS tablet, Take 1 tablet (5 mg total) by mouth daily. For diabetes, Disp: 90 tablet, Rfl: 3 .  losartan (COZAAR) 100 MG tablet, Take 100 mg by mouth daily., Disp: , Rfl:  .  oxymetazoline (QC NASAL RELIEF MOISTURIZING) 0.05 % nasal spray, Place 1 spray into  both nostrils 2 (two) times daily as needed for congestion. QC Nasal Spray, Disp: , Rfl:  .  Polyethyl Glycol-Propyl Glycol (SYSTANE) 0.4-0.3 % SOLN, Place 1 drop into both eyes at bedtime., Disp: , Rfl:  .  prednisoLONE acetate (PRED FORTE) 1 % ophthalmic suspension, , Disp: , Rfl:  .  sodium chloride (DEEP SEA NASAL SPRAY) 0.65 % nasal spray, Place 1-2 sprays into the nose 4 (four) times daily as needed for congestion., Disp: , Rfl:  .  sodium chloride (OCEAN) 0.65 % SOLN nasal spray, Place 1 spray into both nostrils as needed for congestion., Disp: 50 mL, Rfl:  2 .  timolol (BETIMOL) 0.5 % ophthalmic solution, Place 1 drop into both eyes 2 (two) times daily. (Patient taking differently: Place 1 drop into both eyes at bedtime. ), Disp: 10 mL, Rfl: 12 .  nitroGLYCERIN (NITROSTAT) 0.4 MG SL tablet, Place 1 tablet (0.4 mg total) under the tongue every 5 (five) minutes as needed for chest pain. Max of 3 pills; call 911, Disp: 25 tablet, Rfl: 3 .  ofloxacin (OCUFLOX) 0.3 % ophthalmic solution, , Disp: , Rfl:  .  Omega-3 Fatty Acids (FISH OIL) 1000 MG CAPS, Take 1,000 mg by mouth daily. , Disp: , Rfl:   No Known Allergies  I personally reviewed active problem list, medication list, allergies with the patient/caregiver today.  ROS  Ten systems reviewed and is negative except as mentioned in HPI  Objective  Vitals:   07/25/18 1537  BP: 118/84  Pulse: 64  Resp: 16  Temp: 98.4 F (36.9 C)  TempSrc: Oral  SpO2: 95%  Weight: 132 lb (59.9 kg)  Height: 5\' 6"  (1.676 m)    Body mass index is 21.31 kg/m.  Nursing Note and Vital Signs reviewed.  Physical Exam  Constitutional: Patient appears well-developed and well-nourished. Thin No distress.  HEENT: head atraumatic, normocephalic, hard of hearing; temporal wasting present Cardiovascular: Normal rate, regular rhythm and normal heart sounds.  No murmur heard. No BLE edema. Pulmonary/Chest: Effort normal and breath sounds clear bilaterally. No respiratory distress. Abdominal: Soft, bowel sounds hypoactive, abdomen is rigid throughout but no distension - even with bent knees.  No mass is palpable, no rebound. Psychiatric: Patient has a normal mood and affect. behavior is normal. Judgment and thought content normal.  No results found for this or any previous visit (from the past 72 hour(s)).  Assessment & Plan  1. Generalized abdominal rigidity 2. Unintentional weight loss of more than 10 pounds 3. Generalized weakness - Advised that due to frailty, weight loss, and rigidity of abdomen, I  recommend ER presentation at this time.  I am concern for stool impaction vs bowel obstruction vs other etiology and strongly recommend emergent care to ensure.

## 2018-07-25 NOTE — ED Notes (Signed)
Sandwich tray and cup of water given to pt.

## 2018-07-28 ENCOUNTER — Encounter: Payer: Self-pay | Admitting: Nurse Practitioner

## 2018-07-28 ENCOUNTER — Encounter (INDEPENDENT_AMBULATORY_CARE_PROVIDER_SITE_OTHER): Payer: Self-pay | Admitting: Vascular Surgery

## 2018-07-28 ENCOUNTER — Encounter (INDEPENDENT_AMBULATORY_CARE_PROVIDER_SITE_OTHER): Payer: Self-pay

## 2018-07-28 ENCOUNTER — Ambulatory Visit: Payer: Medicare Other | Admitting: Nurse Practitioner

## 2018-07-28 ENCOUNTER — Ambulatory Visit (INDEPENDENT_AMBULATORY_CARE_PROVIDER_SITE_OTHER): Payer: Medicare Other | Admitting: Vascular Surgery

## 2018-07-28 VITALS — BP 149/62 | HR 61 | Resp 16 | Ht 66.0 in | Wt 139.2 lb

## 2018-07-28 VITALS — BP 140/60 | HR 76 | Temp 97.5°F | Resp 16 | Ht 66.0 in | Wt 137.9 lb

## 2018-07-28 DIAGNOSIS — I2510 Atherosclerotic heart disease of native coronary artery without angina pectoris: Secondary | ICD-10-CM

## 2018-07-28 DIAGNOSIS — I10 Essential (primary) hypertension: Secondary | ICD-10-CM

## 2018-07-28 DIAGNOSIS — I701 Atherosclerosis of renal artery: Secondary | ICD-10-CM

## 2018-07-28 DIAGNOSIS — K8020 Calculus of gallbladder without cholecystitis without obstruction: Secondary | ICD-10-CM | POA: Diagnosis not present

## 2018-07-28 DIAGNOSIS — I739 Peripheral vascular disease, unspecified: Secondary | ICD-10-CM | POA: Diagnosis not present

## 2018-07-28 DIAGNOSIS — I700 Atherosclerosis of aorta: Secondary | ICD-10-CM

## 2018-07-28 DIAGNOSIS — K869 Disease of pancreas, unspecified: Secondary | ICD-10-CM

## 2018-07-28 DIAGNOSIS — I779 Disorder of arteries and arterioles, unspecified: Secondary | ICD-10-CM

## 2018-07-28 DIAGNOSIS — K529 Noninfective gastroenteritis and colitis, unspecified: Secondary | ICD-10-CM | POA: Diagnosis not present

## 2018-07-28 DIAGNOSIS — K551 Chronic vascular disorders of intestine: Secondary | ICD-10-CM

## 2018-07-28 DIAGNOSIS — R319 Hematuria, unspecified: Secondary | ICD-10-CM

## 2018-07-28 DIAGNOSIS — K5909 Other constipation: Secondary | ICD-10-CM

## 2018-07-28 LAB — HM DIABETES EYE EXAM

## 2018-07-28 NOTE — Progress Notes (Signed)
Name: Samuel Bryan.   MRN: [id number]    DOB: [birth year]   Date:07/28/2018       Progress Note  Subjective  Chief Complaint  Chief Complaint  Patient presents with  . abdominal issues    HPI  Patient was sent to the ER last Friday for abdominal pain, and abdominal rigidity. CT scan of abdomen revealed: -aortic atherosclerosis,no evidence of aneurysm, deiscetion or vasculiris but significant diffuse stenosis noted in CT angio abdomen pelvis.  -cholelithiasis -pancreastic 7 mm low density reccomend follow up pre and post contrast MRI/MRCP or pancreatic protocol CT in 2 years. -thickening of right colon suggesting colitis  Small blood noted in urine.  Patient given fluid bolus and rx for augmentin twice a day for one week for presumptive infectious colitis.   ___________ Patient saw vascular today recommended continued hypertensive and hyperlipidemia management, continued antiplatelet therapy; additionally angiography  with possible stenting recommended for chronic mesenteric ischemia and PVD.   Patient is on amlodipine 5mg , furosemide 40mg  daily, imdur 30mg  daily, losartan 100mg  daily. Sees Dr. Clayborn Bigness- cardiology MD. Atorvastatin 40mg  nightly  Lab Results  Component Value Date   CHOL 125 02/18/2018   HDL 38 (L) 02/18/2018   LDLCALC 73 02/18/2018   TRIG 62 02/18/2018   CHOLHDL 3.3 02/18/2018    Since ER visit patient has not had any abdominal pain, taking antibiotic as prescribed with no missed doses. Denies nausea, vomiting. Endorses constipation; last BM was Saturday- normal with straining, no blood. No fevers or chills, chest pain, shortness of breath, palpitations.   Patient Active Problem List   Diagnosis Date Noted  . Aortic atherosclerosis ([place]) 07/28/2018  . Cholelithiasis 07/28/2018  . Pancreatic lesion 07/28/2018  . Chronic mesenteric ischemia ([place]) 07/28/2018  . Full code status 01/23/2018  . Memory loss 01/23/2018  . Carotid stenosis, asymptomatic, left  12/18/2017  . Degenerative arthritis of left shoulder region 08/19/2017  . Microalbuminuria 08/19/2017  . On angiotensin receptor blockers (ARB) 08/19/2017  . Type 2 diabetes mellitus ([place]) 08/13/2017  . Preventative health care 12/11/2016  . Abdominal mass 04/11/2016  . Abdominal mass, right upper quadrant 04/03/2016  . Bilateral carotid bruits 04/03/2016  . Medication monitoring encounter 04/03/2016  . Facial injury 10/26/2015  . 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 Arteriosclerosis of coronary artery 10/11/2015  . H/O angina pectoris 10/11/2015  . Hyperlipidemia 09/13/2015  . Chronic kidney disease, stage III (moderate) ([place]) 09/13/2015  . RENAL ATHEROSCLEROSIS 03/16/2009  . Essential hypertension 03/15/2009  . Coronary atherosclerosis 03/15/2009  . Carotid arterial disease ([place]) 03/15/2009  . Peripheral vascular disease ([place]) 03/15/2009  . Anemia in chronic kidney disease (CKD) 03/15/2009    Past Medical History:  Diagnosis Date  . Atherosclerosis of renal artery ([place])   . Essential hypertension, benign   . Occlusion and stenosis of carotid artery without mention of cerebral infarction    moderate left ICA stenosis  . Peripheral vascular disease, unspecified ([place])    mild lifestyle limiting claudication  . Pure hypercholesterolemia   . Renal insufficiency   . Type 2 diabetes mellitus ([place])     Past Surgical History:  Procedure Laterality Date  . APPENDECTOMY  1970's  . COLONOSCOPY    . coronary artery stenting  1999   at [facility]   . ENDARTERECTOMY Left 12/18/2017   Procedure: ENDARTERECTOMY CAROTID WITH PATCH;  Surgeon: Delana Meyer Dolores Lory, MD;  Location: [facility] ORS;  Service: Vascular;  Laterality: Left;  . FACIAL LACERATION REPAIR Left 12/13/2015   performed  at [facility] by Dr. Marcial Pacas  . renal angiography      Social History   Tobacco Use  . Smoking status: Former Smoker    Types: Cigarettes    Last attempt to quit: 10/29/1970    Years since quitting: 47.7  .  Smokeless tobacco: Never Used  Substance Use Topics  . Alcohol use: No    Alcohol/week: 0.0 standard drinks     Current Outpatient Medications:  .  amLODipine (NORVASC) 5 MG tablet, Take 1 tablet (5 mg total) by mouth daily., Disp: 90 tablet, Rfl: 3 .  amoxicillin-clavulanate (AUGMENTIN) 875-125 MG tablet, Take 1 tablet by mouth 2 (two) times daily for 7 days., Disp: 14 tablet, Rfl: 0 .  aspirin EC 81 MG tablet, Take 81 mg by mouth daily. , Disp: , Rfl:  .  atorvastatin (LIPITOR) 40 MG tablet, Take 1 tablet (40 mg total) by mouth at bedtime., Disp: 90 tablet, Rfl: 3 .  furosemide (LASIX) 40 MG tablet, Take 40 mg by mouth daily., Disp: , Rfl:  .  isosorbide mononitrate (IMDUR) 30 MG 24 hr tablet, Take 1 tablet (30 mg total) by mouth daily., Disp: , Rfl:  .  ketorolac (ACULAR) 0.5 % ophthalmic solution, , Disp: , Rfl:  .  latanoprost (XALATAN) 0.005 % ophthalmic solution, Place 1 drop into both eyes at bedtime., Disp: , Rfl:  .  linagliptin (TRADJENTA) 5 MG TABS tablet, Take 1 tablet (5 mg total) by mouth daily. For diabetes, Disp: 90 tablet, Rfl: 3 .  losartan (COZAAR) 100 MG tablet, Take 100 mg by mouth daily., Disp: , Rfl:  .  nitroGLYCERIN (NITROSTAT) 0.4 MG SL tablet, Place 1 tablet (0.4 mg total) under the tongue every 5 (five) minutes as needed for chest pain. Max of 3 pills; call 911, Disp: 25 tablet, Rfl: 3 .  ofloxacin (OCUFLOX) 0.3 % ophthalmic solution, , Disp: , Rfl:  .  Omega-3 Fatty Acids (FISH OIL) 1000 MG CAPS, Take 1,000 mg by mouth daily. , Disp: , Rfl:  .  oxymetazoline (QC NASAL RELIEF MOISTURIZING) 0.05 % nasal spray, Place 1 spray into both nostrils 2 (two) times daily as needed for congestion. QC Nasal Spray, Disp: , Rfl:  .  Polyethyl Glycol-Propyl Glycol (SYSTANE) 0.4-0.3 % SOLN, Place 1 drop into both eyes at bedtime., Disp: , Rfl:  .  prednisoLONE acetate (PRED FORTE) 1 % ophthalmic suspension, , Disp: , Rfl:  .  sodium chloride (DEEP SEA NASAL SPRAY) 0.65 % nasal  spray, Place 1-2 sprays into the nose 4 (four) times daily as needed for congestion., Disp: , Rfl:  .  sodium chloride (OCEAN) 0.65 % SOLN nasal spray, Place 1 spray into both nostrils as needed for congestion., Disp: 50 mL, Rfl: 2 .  timolol (BETIMOL) 0.5 % ophthalmic solution, Place 1 drop into both eyes 2 (two) times daily. (Patient taking differently: Place 1 drop into both eyes at bedtime. ), Disp: 10 mL, Rfl: 12 .  brimonidine (ALPHAGAN) 0.2 % ophthalmic solution, 1 drop Three (3) times a day. (Patient not taking: Reported on 07/28/2018), Disp: 5 mL, Rfl: 2  No Known Allergies  ROS   No other specific complaints in a complete review of systems (except as listed in HPI above).  Objective  Vitals:   07/28/18 1338 07/28/18 1339  BP: (!) 140/44 140/60  Pulse: 76   Resp: 16   Temp: (!) 97.5 F (36.4 C)   TempSrc: Oral   SpO2: 99%   Weight: 137 lb  14.4 oz (62.6 kg)   Height: 5\' 6"  (1.676 m)      Body mass index is 22.26 kg/m.  Nursing Note and Vital Signs reviewed.  Physical Exam  Constitutional: He is oriented to person, place, and time. He appears well-developed and well-nourished.  Neck: Normal range of motion. Neck supple.  Cardiovascular: Normal rate, normal heart sounds and intact distal pulses.  Pulmonary/Chest: Effort normal and breath sounds normal.  Abdominal: Soft. Normal appearance. He exhibits no distension, no pulsatile liver and no mass. Bowel sounds are increased. There is no tenderness. There is no rebound.  Neurological: He is alert and oriented to person, place, and time.  Skin: Skin is warm and dry.  Psychiatric: He has a normal mood and affect. His behavior is normal. Judgment and thought content normal.     No results found for this or any previous visit (from the past 48 hour(s)).  Assessment & Plan  1. Colitis Continue antibiotic till course is completed.   2. Aortic atherosclerosis (HCC) Blood pressure and cholesterol contorl  3. Calculus  of gallbladder without cholecystitis without obstruction Discussed diet   4. Pancreatic lesion Repeat imaging recommended in 2 years  5. Hematuria, unspecified type - Urinalysis, Routine w reflex microscopic  6. Other constipation Discussed with Dr. Sanda Klein who recommended decreasing amlodipine 2.5mg  daily and come in for recheck. Daughter notes some difficulty coming in soon will call in blood pressure readings if unable to come for nurse visit. Discussed using stool softener PRN and referral to GI if needed.    ER precautions given. Face-to-face time with patient was more than 25 minutes, >50% time spent counseling and coordination of care

## 2018-07-28 NOTE — Patient Instructions (Addendum)
-  Because of your constipation we would like to decrease your amlodipine from 5mg  a day to 2.5mg  a day. Please start taking half a tablet of amlodipine a day.  - We want to avoid taking laxatives due to your colitis but If needed for constipation you can take docusate sodium 100mg  daily as needed.  - Continue taking your antibiotic course until it is complete.  - Our goal for your blood pressure is to be under 150(top)/90(bottom). Please check your blood pressure 3 times when you are resting over the next week and we will follow-up with you in 10 days for a nurse visit.  - I recommend taking ensure high-calorie supplements to help prevent malnutrition and if you would like to speak to a dietician am happy to place that order

## 2018-07-28 NOTE — Progress Notes (Signed)
MRN : [id number]  Samuel Bryan. is a 84 y.o. ([birth year]) male who presents with chief complaint of  Chief Complaint  Patient presents with  . Follow-up  .  History of Present Illness:  The patient was recently seen in the emergency room with the abrupt onset of abdominal pain.  Work-up included a CT angiogram which demonstrated multilevel atherosclerotic occlusive disease this included the origin of the celiac and an occlusion of the SMA.  Also noted on the CT scan was inflammatory changes of the colon.  He was started on Augmentin.  Today in the office he states his pain is essentially gone but he does admit to significant weight loss and worsening of his abdominal symptoms after he eats.  Descriptors: Location is epigastric Quality is sharp Severity extremely intense 10 out of 10 Duration onset was abrupt and the pain lasted for several days but has been improved with antibiotics Timing no particular relation although in discussion with the patient he does seem to have postprandial symptoms Context eating activity has no effect Modifying the antibiotics have relieved his pain but otherwise he was unaware of any alleviating factors.  The patient is also follow for carotid stenosis status post left carotid endarterectomy on 12/18/2017.  There were no post operative problems or complications related to the surgery.    The patient denies interval amaurosis fugax. There is no recent history of TIA symptoms or focal motor deficits. There is no prior documented CVA.  The patient denies headache.  The patient is taking enteric-coated aspirin 81 mg daily.  The patient is also seen for followup of PAD. There have been no interval changes in lower extremity symptoms, he notes that his legs get "tired" when he walks. No interval shortening of the patient's claudication distance or development of rest pain symptoms. No new ulcers or wounds have occurred since the last visit.  There  have been no significant changes to the patient's overall health care.  The patient denies history of DVT, PE or superficial thrombophlebitis. The patient denies recent episodes of angina or shortness of breath.   The patient has a history of coronary artery disease, no recent episodes of angina or shortness of breath. There is a history of hyperlipidemia which is being treated with a statin.    Current Meds  Medication Sig  . amLODipine (NORVASC) 5 MG tablet Take 1 tablet (5 mg total) by mouth daily.  Marland Kitchen amoxicillin-clavulanate (AUGMENTIN) 875-125 MG tablet Take 1 tablet by mouth 2 (two) times daily for 7 days.  Marland Kitchen aspirin EC 81 MG tablet Take 81 mg by mouth daily.   Marland Kitchen atorvastatin (LIPITOR) 40 MG tablet Take 1 tablet (40 mg total) by mouth at bedtime.  . furosemide (LASIX) 40 MG tablet Take 40 mg by mouth daily.  . isosorbide mononitrate (IMDUR) 30 MG 24 hr tablet Take 1 tablet (30 mg total) by mouth daily.  Marland Kitchen ketorolac (ACULAR) 0.5 % ophthalmic solution   . latanoprost (XALATAN) 0.005 % ophthalmic solution Place 1 drop into both eyes at bedtime.  Marland Kitchen linagliptin (TRADJENTA) 5 MG TABS tablet Take 1 tablet (5 mg total) by mouth daily. For diabetes  . losartan (COZAAR) 100 MG tablet Take 100 mg by mouth daily.  . nitroGLYCERIN (NITROSTAT) 0.4 MG SL tablet Place 1 tablet (0.4 mg total) under the tongue every 5 (five) minutes as needed for chest pain. Max of 3 pills; call 911  . ofloxacin (OCUFLOX) 0.3 % ophthalmic solution   .  Omega-3 Fatty Acids (FISH OIL) 1000 MG CAPS Take 1,000 mg by mouth daily.   Marland Kitchen oxymetazoline (QC NASAL RELIEF MOISTURIZING) 0.05 % nasal spray Place 1 spray into both nostrils 2 (two) times daily as needed for congestion. QC Nasal Spray  . Polyethyl Glycol-Propyl Glycol (SYSTANE) 0.4-0.3 % SOLN Place 1 drop into both eyes at bedtime.  . prednisoLONE acetate (PRED FORTE) 1 % ophthalmic suspension   . sodium chloride (DEEP SEA NASAL SPRAY) 0.65 % nasal spray Place 1-2  sprays into the nose 4 (four) times daily as needed for congestion.  . sodium chloride (OCEAN) 0.65 % SOLN nasal spray Place 1 spray into both nostrils as needed for congestion.  . timolol (BETIMOL) 0.5 % ophthalmic solution Place 1 drop into both eyes 2 (two) times daily. (Patient taking differently: Place 1 drop into both eyes at bedtime. )    Past Medical History:  Diagnosis Date  . Atherosclerosis of renal artery ([place])   . Essential hypertension, benign   . Occlusion and stenosis of carotid artery without mention of cerebral infarction    moderate left ICA stenosis  . Peripheral vascular disease, unspecified ([place])    mild lifestyle limiting claudication  . Pure hypercholesterolemia   . Renal insufficiency   . Type 2 diabetes mellitus ([place])     Past Surgical History:  Procedure Laterality Date  . APPENDECTOMY  1970's  . COLONOSCOPY    . coronary artery stenting  1999   at [facility]   . ENDARTERECTOMY Left 12/18/2017   Procedure: ENDARTERECTOMY CAROTID WITH PATCH;  Surgeon: Delana Meyer Dolores Lory, MD;  Location: [facility] ORS;  Service: Vascular;  Laterality: Left;  . FACIAL LACERATION REPAIR Left 12/13/2015   performed at [facility] by Dr. Marcial Pacas  . renal angiography      Social History Social History   Tobacco Use  . Smoking status: Former Smoker    Types: Cigarettes    Last attempt to quit: 10/29/1970    Years since quitting: 47.7  . Smokeless tobacco: Never Used  Substance Use Topics  . Alcohol use: No    Alcohol/week: 0.0 standard drinks  . Drug use: No    Family History Family History  Problem Relation Age of Onset  . Diabetes Mother   . Heart disease Mother   . Hypertension Brother   . Hyperlipidemia Brother   . Diabetes Brother   . Hypertension Son   . Hypertension Son   . Birth defects Neg Hx   . Stroke Neg Hx     No Known Allergies   REVIEW OF SYSTEMS (Negative unless checked)  Constitutional: [] Weight loss  [] Fever  [] Chills Cardiac: [] Chest pain   [] Chest  pressure   [] Palpitations   [] Shortness of breath when laying flat   [] Shortness of breath with exertion. Vascular:  [x] Pain in legs with walking   [x] Pain in legs at rest  [] History of DVT   [] Phlebitis   [] Swelling in legs   [] Varicose veins   [] Non-healing ulcers Pulmonary:   [] Uses home oxygen   [] Productive cough   [] Hemoptysis   [] Wheeze  [] COPD   [] Asthma Neurologic:  [] Dizziness   [] Seizures   [] History of stroke   [] History of TIA  [] Aphasia   [] Vissual changes   [] Weakness or numbness in arm   [] Weakness or numbness in leg Musculoskeletal:   [] Joint swelling   [] Joint pain   [x] Low back pain Hematologic:  [] Easy bruising  [] Easy bleeding   [] Hypercoagulable state   [] Anemic Gastrointestinal:  []   Diarrhea   [] Vomiting  [x] Gastroesophageal reflux/heartburn   [] Difficulty swallowing. Genitourinary:  [x] Chronic kidney disease   [] Difficult urination  [] Frequent urination   [] Blood in urine Skin:  [] Rashes   [] Ulcers  Psychological:  [] History of anxiety   []  History of major depression.  Physical Examination  Vitals:   07/28/18 1015  BP: (!) 149/62  Pulse: 61  Resp: 16  Weight: 139 lb 3.2 oz (63.1 kg)  Height: 5\' 6"  (1.676 m)   Body mass index is 22.47 kg/m. Gen: WD/WN, NAD Head: /AT, No temporalis wasting.  Ear/Nose/Throat: Hearing grossly intact, nares w/o erythema or drainage Eyes: PER, EOMI, sclera nonicteric.  Neck: Supple, no large masses.   Pulmonary:  Good air movement, no audible wheezing bilaterally, no use of accessory muscles.  Cardiac: RRR, no JVD Vascular:  Vessel Right Left  Radial Palpable Palpable  PT Not Palpable Not Palpable  DP Not Palpable Not Palpable  Gastrointestinal: Non-distended. No guarding/no peritoneal signs.  Musculoskeletal: M/S 5/5 throughout.  No deformity or atrophy.  Neurologic: CN 2-12 intact. Symmetrical.  Speech is fluent. Motor exam as listed above. Psychiatric: Judgment intact, Mood & affect appropriate for pt's clinical  situation. Dermatologic: No rashes or ulcers noted.  No changes consistent with cellulitis. Lymph : No lichenification or skin changes of chronic lymphedema.  CBC Lab Results  Component Value Date   WBC 10.8 (H) 07/25/2018   HGB 11.2 (L) 07/25/2018   HCT 33.0 (L) 07/25/2018   MCV 96.6 07/25/2018   PLT 220 07/25/2018    BMET    Component Value Date/Time   NA 138 07/25/2018 1632   NA 137 04/09/2016 1008   NA 137 04/26/2012 1145   K 4.3 07/25/2018 1632   K 4.0 04/26/2012 1145   CL 102 07/25/2018 1632   CL 104 04/26/2012 1145   CO2 27 07/25/2018 1632   CO2 29 04/26/2012 1145   GLUCOSE 169 (H) 07/25/2018 1632   GLUCOSE 105 (H) 04/26/2012 1145   BUN 34 (H) 07/25/2018 1632   BUN 19 04/09/2016 1008   BUN 16 04/26/2012 1145   CREATININE 1.94 (H) 07/25/2018 1632   CREATININE 1.63 (H) 08/09/2017 1202   CALCIUM 9.2 07/25/2018 1632   CALCIUM 8.9 04/26/2012 1145   GFRNONAA 29 (L) 07/25/2018 1632   GFRNONAA 37 (L) 08/09/2017 1202   GFRAA 34 (L) 07/25/2018 1632   GFRAA 43 (L) 08/09/2017 1202   Estimated Creatinine Clearance: 23 mL/min (A) (by C-G formula based on SCr of 1.94 mg/dL (H)).  COAG Lab Results  Component Value Date   INR 1.06 12/10/2017    Radiology Ct Angio Abd/pel W And/or Wo Contrast  Result Date: 07/25/2018 CLINICAL DATA:  Acute generalized abdominal pain. EXAM: CTA ABDOMEN AND PELVIS WITHOUT AND WITH CONTRAST TECHNIQUE: Multidetector CT imaging of the abdomen and pelvis was performed using the standard protocol during bolus administration of intravenous contrast. Multiplanar reconstructed images and MIPs were obtained and reviewed to evaluate the vascular anatomy. CONTRAST:  68mL OMNIPAQUE IOHEXOL 350 MG/ML SOLN COMPARISON:  CT scan of April 19, 2016. FINDINGS: VASCULAR Aorta: Atherosclerosis of abdominal aorta is noted without aneurysm or dissection. 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 SMA: Occlusion at  its origin secondary to calcified plaque. Reconstitution of its more distal portion is noted through collateral branches. Renals: Severe stenosis is noted at the origins of both renal arteries secondary to heavily calcified plaque. IMA: Patent without evidence of aneurysm, dissection, vasculitis  or significant stenosis. Inflow: Severe stenosis is noted at the origin of the left common iliac artery secondary to calcified plaque. Severe stenosis is noted at the origin of the right common iliac artery secondary to calcified plaque. Proximal Outflow: Severe stenosis is noted in right common femoral artery secondary to heavily calcified eccentric plaque. Moderate stenosis is noted in left common femoral artery secondary to heavily calcified eccentric plaque. Veins: No obvious venous abnormality within the limitations of this arterial phase study. Review of the MIP images confirms the above findings. NON-VASCULAR Lower chest: No acute abnormality. Hepatobiliary: Cholelithiasis is noted. Liver is unremarkable. No biliary dilatation is noted. Pancreas: 7 mm rounded low density is seen in pancreatic body. Pancreatic ductal dilatation is noted measuring 6 mm. No other abnormality seen. Spleen: Normal in size without focal abnormality. Adrenals/Urinary Tract: Adrenal glands are unremarkable. Kidneys are normal, without renal calculi, focal lesion, or hydronephrosis. Bladder is unremarkable. Stomach/Bowel: The stomach is unremarkable. The appendix is not visualized. There is no evidence of bowel obstruction. Severe wall thickening of right colon is noted suggesting infectious or inflammatory colitis. The appendix is not clearly visualized. Lymphatic: No significant adenopathy is noted. Reproductive: Prostate is unremarkable. Other: No abdominal wall hernia or abnormality. No abdominopelvic ascites. Musculoskeletal: No acute or significant osseous findings. IMPRESSION: VASCULAR Severe stenosis is noted at the origins of the  celiac and bilateral renal artery secondary to heavily calcified plaque. 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 Atherosclerosis of abdominal aorta is noted without aneurysm or dissection. Severe stenoses are noted at the origins of both common iliac arteries secondary to calcified plaque. NON-VASCULAR Findings consistent with infectious or inflammatory colitis of the cecum. Cholelithiasis. 7 mm low density seen in pancreatic body. Recommend follow up pre and post contrast MRI/MRCP or pancreatic protocol CT in 2 years. This recommendation follows ACR consensus guidelines: Management of Incidental Pancreatic Cysts: A White Paper of the ACR Incidental Findings Committee. [place] [id number]. Electronically Signed   By: Marijo Conception, M.D.   On: 07/25/2018 19:49   Studies/Documentation I have personally reviewed the CT angios dated 07/25/2018.  I concur there appears to be a short segment occlusion of the SMA from its origin distally there is a high-grade stenosis of the celiac greater than 80%.  There appear to be greater than 80% bilateral renal artery stenosis and there is greater than 70% bilateral common iliac artery stenosis at the origin extending into the distal aorta.  Assessment/Plan 1. Chronic mesenteric ischemia (HCC) The patient has symptoms consistent with mesenteric ischemia and he has significant mesenteric stenosis  Angiography was discussed with the patient, the risks and benefits were reviewed and all questions were answered.  The patient has agreed to proceed with angiography and the intention of intervention.     Arrangements will be made to treat the the celiac artery and possibly the SMA.  The patient will continue the current medications, no changes at this time.  2. Peripheral vascular disease ([place]) Recommend:  The patient has  experienced increased symptoms and is now describing lifestyle limiting claudication and mild rest pain.   Given the severity of the patient's lower extremity symptoms the patient should undergo angiography and possible bilateral iliac stenting.  Risk and benefits were reviewed the patient.  Indications for the procedure were reviewed.  All questions were answered, the patient agrees to proceed.  The patient should continue walking and begin a more formal exercise program.  The patient should continue antiplatelet therapy and aggressive treatment of the lipid abnormalities  The patient will follow up with me after the angiogram.   3. Bilateral carotid artery disease, unspecified type ([place]) Recommend:  Given the patient's asymptomatic subcritical stenosis no further invasive testing or surgery at this time.  Continue antiplatelet therapy as prescribed Continue management of CAD, HTN and Hyperlipidemia Healthy heart diet,  encouraged exercise at least 4 times per week  Follow up in 12 months with duplex ultrasound and physical exam   4. RENAL ATHEROSCLEROSIS May need intervention in this vascular bed as well  5. Essential hypertension Continue antihypertensive medications as already ordered, these medications have been reviewed and there are no changes at this time.   6. Atherosclerosis of native coronary artery of native heart without angina pectoris Continue cardiac and antihypertensive medications as already ordered and reviewed, no changes at this time.  Continue statin as ordered and reviewed, no changes at this time  Nitrates PRN for chest pain    Hortencia Pilar, MD  07/28/2018 10:21 AM

## 2018-07-29 LAB — URINALYSIS, ROUTINE W REFLEX MICROSCOPIC
Bilirubin Urine: NEGATIVE
Glucose, UA: NEGATIVE
Hgb urine dipstick: NEGATIVE
KETONES UR: NEGATIVE
LEUKOCYTES UA: NEGATIVE
NITRITE: NEGATIVE
Protein, ur: NEGATIVE
SPECIFIC GRAVITY, URINE: 1.017 (ref 1.001–1.03)
pH: 5.5 (ref 5.0–8.0)

## 2018-07-30 ENCOUNTER — Telehealth: Payer: Self-pay | Admitting: Family Medicine

## 2018-07-30 MED ORDER — AMLODIPINE BESYLATE 2.5 MG PO TABS: 2.5000 mg | ORAL_TABLET | Freq: Every day | ORAL | 3 refills | Status: DC

## 2018-07-30 NOTE — Telephone Encounter (Signed)
Copied from [place] [id number]. Topic: Quick Communication - See Telephone Encounter >> Jul 30, 2018  9:42 AM Ahmed Prima L wrote: CRM for notification. See Telephone encounter for: 07/30/18.  Samuel Bryan, daughter called with his blood pressure readings, per Dr Sanda Klein.   Oct 1st , 125/57 pulse 69, she said she will check again it again tomorrow.

## 2018-07-30 NOTE — Telephone Encounter (Signed)
I believe this is on the new lower dose of amlodipine, half of a 5 mg pill Confirm, and I sent in new Rx for the lower dose We really appreciate her checking and getting back to [place]

## 2018-07-30 NOTE — Telephone Encounter (Signed)
Left detailed voicemail

## 2018-08-01 NOTE — Telephone Encounter (Signed)
I'll run this by Suezanne Cheshire, DNP who saw him earlier Please thank her for calling

## 2018-08-01 NOTE — Telephone Encounter (Signed)
Samuel Bryan calling back this morning to advise yesterday pt's bp was 111/62.

## 2018-08-04 NOTE — Telephone Encounter (Signed)
Documentation reviewed 

## 2018-08-05 ENCOUNTER — Telehealth: Payer: Self-pay | Admitting: Family Medicine

## 2018-08-05 NOTE — Telephone Encounter (Signed)
Copied from [place] [id number]. Topic: Quick Communication - Office Called Patient >> Jul 30, 2018 11:52 AM Cathrine Muster, CMA wrote: Reason for CRM: I believe this is on the new lower dose of amlodipine, half of a 5 mg pill Confirm, and I sent in new Rx for the lower dose We really appreciate her checking and getting back to [place] >> Aug 04, 2018 10:48 AM Percell Belt A wrote: Pt called in to report new bp  135/63- yesterday at noon  Pulse 66

## 2018-08-07 ENCOUNTER — Ambulatory Visit: Payer: Medicare Other

## 2018-08-08 ENCOUNTER — Telehealth: Payer: Self-pay | Admitting: Family Medicine

## 2018-08-08 NOTE — Telephone Encounter (Signed)
Thank you Please let her know that I'm pleased with those numbers and we'll continue his current BP medicines

## 2018-08-08 NOTE — Telephone Encounter (Signed)
Pt.notified

## 2018-08-08 NOTE — Telephone Encounter (Signed)
Copied from [place] [id number]. Topic: General - Other >> Aug 08, 2018  1:43 PM Lennox Solders wrote: Reason for SSQ:[id number] pt daughter is calling her dad bp on 08/07/18  was 128/67 pulse 75

## 2018-08-12 ENCOUNTER — Other Ambulatory Visit (INDEPENDENT_AMBULATORY_CARE_PROVIDER_SITE_OTHER): Payer: Self-pay | Admitting: Vascular Surgery

## 2018-08-14 ENCOUNTER — Telehealth: Payer: Self-pay

## 2018-08-14 NOTE — Telephone Encounter (Signed)
Thank her for checking She may monitor periodically (once or twice a week) and contact us if going up or down or any concerns

## 2018-08-14 NOTE — Telephone Encounter (Signed)
Pt notified.    Copied from [place] [id number]. Topic: General - Other >> Aug 13, 2018  5:17 PM Mcneil, Ja-Kwan wrote: Reason for CRM: Pt daughter [name] called to report pt blood pressure reading: BP 122/55  pulse 66

## 2018-08-14 NOTE — Telephone Encounter (Signed)
Pt.notified

## 2018-08-18 MED ORDER — CEFAZOLIN SODIUM-DEXTROSE 2-4 GM/100ML-% IV SOLN
2.0000 g | Freq: Once | INTRAVENOUS | Status: AC
  Administered 2018-08-19: 2 g via INTRAVENOUS

## 2018-08-19 ENCOUNTER — Encounter: Payer: Self-pay | Admitting: Emergency Medicine

## 2018-08-19 ENCOUNTER — Ambulatory Visit
Admission: RE | Admit: 2018-08-19 | Discharge: 2018-08-19 | Disposition: A | Discharge status: Home / Self Care | Payer: Medicare Other | Source: Ambulatory Visit | Attending: Vascular Surgery | Admitting: Vascular Surgery

## 2018-08-19 ENCOUNTER — Encounter
Admission: RE | Disposition: A | Discharge status: Home / Self Care | Payer: Self-pay | Source: Ambulatory Visit | Attending: Vascular Surgery

## 2018-08-19 DIAGNOSIS — I701 Atherosclerosis of renal artery: Secondary | ICD-10-CM | POA: Insufficient documentation

## 2018-08-19 DIAGNOSIS — E785 Hyperlipidemia, unspecified: Secondary | ICD-10-CM | POA: Diagnosis not present

## 2018-08-19 DIAGNOSIS — Z87891 Personal history of nicotine dependence: Secondary | ICD-10-CM | POA: Insufficient documentation

## 2018-08-19 DIAGNOSIS — Z833 Family history of diabetes mellitus: Secondary | ICD-10-CM | POA: Diagnosis not present

## 2018-08-19 DIAGNOSIS — I6522 Occlusion and stenosis of left carotid artery: Secondary | ICD-10-CM | POA: Diagnosis not present

## 2018-08-19 DIAGNOSIS — I70223 Atherosclerosis of native arteries of extremities with rest pain, bilateral legs: Secondary | ICD-10-CM | POA: Insufficient documentation

## 2018-08-19 DIAGNOSIS — Z9889 Other specified postprocedural states: Secondary | ICD-10-CM | POA: Diagnosis not present

## 2018-08-19 DIAGNOSIS — Z95820 Peripheral vascular angioplasty status with implants and grafts: Secondary | ICD-10-CM | POA: Insufficient documentation

## 2018-08-19 DIAGNOSIS — Z8249 Family history of ischemic heart disease and other diseases of the circulatory system: Secondary | ICD-10-CM | POA: Insufficient documentation

## 2018-08-19 DIAGNOSIS — K551 Chronic vascular disorders of intestine: Secondary | ICD-10-CM | POA: Insufficient documentation

## 2018-08-19 DIAGNOSIS — E119 Type 2 diabetes mellitus without complications: Secondary | ICD-10-CM | POA: Insufficient documentation

## 2018-08-19 DIAGNOSIS — I10 Essential (primary) hypertension: Secondary | ICD-10-CM | POA: Diagnosis not present

## 2018-08-19 HISTORY — PX: VISCERAL ANGIOGRAPHY: CATH118276

## 2018-08-19 LAB — GLUCOSE, CAPILLARY: Glucose-Capillary: 97 mg/dL (ref 70–99)

## 2018-08-19 LAB — CREATININE, SERUM
Creatinine, Ser: 1.57 mg/dL — ABNORMAL HIGH (ref 0.61–1.24)
GFR calc Af Amer: 43 mL/min — ABNORMAL LOW (ref >60)
GFR calc non Af Amer: 37 mL/min — ABNORMAL LOW (ref >60)

## 2018-08-19 LAB — BUN: BUN: 28 mg/dL — AB (ref 8–23)

## 2018-08-19 SURGERY — VISCERAL ANGIOGRAPHY
Anesthesia: Moderate Sedation

## 2018-08-19 MED ORDER — SODIUM CHLORIDE 0.9 % IV SOLN
INTRAVENOUS | Status: DC
  Administered 2018-08-19: 11:00:00 via INTRAVENOUS

## 2018-08-19 MED ORDER — MIDAZOLAM HCL 2 MG/2ML IJ SOLN
INTRAMUSCULAR | Status: DC | PRN
  Administered 2018-08-19: 1 mg via INTRAVENOUS
  Administered 2018-08-19: 0.5 mg via INTRAVENOUS
  Administered 2018-08-19: 1 mg via INTRAVENOUS
  Administered 2018-08-19: 0.5 mg via INTRAVENOUS

## 2018-08-19 MED ORDER — ONDANSETRON HCL 4 MG/2ML IJ SOLN: 4.0000 mg | Freq: Four times a day (QID) | INTRAMUSCULAR | Status: DC | PRN

## 2018-08-19 MED ORDER — MORPHINE SULFATE (PF) 4 MG/ML IV SOLN: 2.0000 mg | INTRAVENOUS | Status: DC | PRN

## 2018-08-19 MED ORDER — IOPAMIDOL (ISOVUE-300) INJECTION 61%
INTRAVENOUS | Status: DC | PRN
  Administered 2018-08-19: 115 mL via INTRA_ARTERIAL

## 2018-08-19 MED ORDER — HEPARIN SODIUM (PORCINE) 1000 UNIT/ML IJ SOLN
INTRAMUSCULAR | Status: AC
  Filled 2018-08-19: qty 1

## 2018-08-19 MED ORDER — FENTANYL CITRATE (PF) 100 MCG/2ML IJ SOLN
INTRAMUSCULAR | Status: AC
  Filled 2018-08-19: qty 2

## 2018-08-19 MED ORDER — HYDROMORPHONE HCL 1 MG/ML IJ SOLN: 1.0000 mg | Freq: Once | INTRAMUSCULAR | Status: DC | PRN

## 2018-08-19 MED ORDER — METHYLPREDNISOLONE SODIUM SUCC 125 MG IJ SOLR: 125.0000 mg | INTRAMUSCULAR | Status: DC | PRN

## 2018-08-19 MED ORDER — MIDAZOLAM HCL 5 MG/5ML IJ SOLN
INTRAMUSCULAR | Status: AC
  Filled 2018-08-19: qty 5

## 2018-08-19 MED ORDER — FAMOTIDINE 20 MG PO TABS: 40.0000 mg | ORAL_TABLET | ORAL | Status: DC | PRN

## 2018-08-19 MED ORDER — FENTANYL CITRATE (PF) 100 MCG/2ML IJ SOLN
INTRAMUSCULAR | Status: DC | PRN
  Administered 2018-08-19 (×4): 25 ug via INTRAVENOUS

## 2018-08-19 MED ORDER — HEPARIN SODIUM (PORCINE) 1000 UNIT/ML IJ SOLN
INTRAMUSCULAR | Status: DC | PRN
  Administered 2018-08-19: 5000 [IU] via INTRAVENOUS

## 2018-08-19 MED ORDER — CLOPIDOGREL BISULFATE 75 MG PO TABS: 75.0000 mg | ORAL_TABLET | Freq: Every day | ORAL | 4 refills | Status: DC

## 2018-08-19 SURGICAL SUPPLY — 32 items
BALLN MUSTANG 8X20X75 (BALLOONS) ×3
BALLN ULTRASCORE 5X40X130 (BALLOONS) ×3
BALLN ULTRVRSE 8X40X75C (BALLOONS) ×3
BALLN ULTRVRSE PTA 5X40X75C (BALLOONS) ×3
BALLOON MUSTANG 8X20X75 (BALLOONS) ×1 IMPLANT
BALLOON ULTRASCORE 5X40X130 (BALLOONS) ×1 IMPLANT
BALLOON ULTRVRSE 8X40X75C (BALLOONS) ×1 IMPLANT
BALLOON ULTRVRSE PTA 5X40X75C (BALLOONS) ×1 IMPLANT
CATH BEACON 5 .035 100 C2 TIP (CATHETERS) ×3 IMPLANT
CATH PIG 70CM (CATHETERS) ×3 IMPLANT
CATH VS15FR (CATHETERS) ×3 IMPLANT
DEVICE PRESTO INFLATION (MISCELLANEOUS) ×6 IMPLANT
DEVICE STARCLOSE SE CLOSURE (Vascular Products) ×6 IMPLANT
DEVICE TORQUE .025-.038 (MISCELLANEOUS) ×3 IMPLANT
GLIDECATH F4/38/100ST (CATHETERS) ×3 IMPLANT
GLIDEWIRE ADV .035X260CM (WIRE) ×3 IMPLANT
INTRODUCER 7FR 23CM (INTRODUCER) ×6 IMPLANT
NEEDLE ENTRY 21GA 7CM ECHOTIP (NEEDLE) ×3 IMPLANT
PACK ANGIOGRAPHY (CUSTOM PROCEDURE TRAY) ×3 IMPLANT
SET INTRO CAPELLA COAXIAL (SET/KITS/TRAYS/PACK) ×3 IMPLANT
SHEATH BRITE TIP 5FRX11 (SHEATH) ×3 IMPLANT
SHEATH BRITE TIP 7FRX5.5 (SHEATH) IMPLANT
SHEATH DEST 7F 45CM LIMA (SHEATH) ×3 IMPLANT
SHIELD X-DRAPE GOLD 12X17 (MISCELLANEOUS) ×3 IMPLANT
STENT LIFESTREAM 6X16X80 (Permanent Stent) ×3 IMPLANT
STENT LIFESTREAM 7X16X80 (Permanent Stent) ×3 IMPLANT
STENT LIFESTREAM 7X58X80 (Permanent Stent) ×6 IMPLANT
SYR MEDRAD MARK V 150ML (SYRINGE) ×3 IMPLANT
TUBING CONTRAST HIGH PRESS 72 (TUBING) ×3 IMPLANT
WIRE AQUATRACK .035X260CM (WIRE) ×3 IMPLANT
WIRE J 3MM .035X145CM (WIRE) ×3 IMPLANT
WIRE MAGIC TOR.035 180C (WIRE) ×6 IMPLANT

## 2018-08-19 NOTE — H&P (Signed)
[facility] History & Physical Update  The patient was interviewed and re-examined.  The patient's previous History and Physical has been reviewed and is unchanged.  There is no change in the plan of care. We plan to proceed with the scheduled procedure.  Hortencia Pilar, MD  08/19/2018, 11:08 AM

## 2018-08-19 NOTE — Op Note (Signed)
[facility]  Percutaneous Study/Intervention Procedural Note   Date of Surgery: 08/19/2018  Surgeon:Schnier, Dolores Lory   Pre-operative Diagnosis: Chronic mesenteric ischemia; atherosclerotic occlusive disease bilateral lower extremities with rest pain bilaterally  Post-operative diagnosis:  Same  Procedure(s) Performed:  1.  Abdominal aortogram  2.  Bilateral iliofemoral runoff  3.  Percutaneous transluminal angioplasty and stent placement right common iliac artery; "kissing balloon" technique  4.  Percutaneous transluminal and plasty and stent placement left common iliac artery; "kissing balloon" technique  5.  Ultrasound guided access bilateral common femoral arteries  6.  Introduction catheter into celiac artery             7.  Percutaneous transluminal angioplasty and stent placement celiac artery             8.  StarClose closure device bilateral common femoral arteries  Anesthesia: Conscious sedation was administered under my direct supervision by the interventional radiology RN. IV Versed plus fentanyl were utilized. Continuous ECG, pulse oximetry and blood pressure was monitored throughout the entire procedure. Conscious sedation was for a total of 108 minutes.  Sheath: 7 French 23 cm Pinnacle retrograde right and left common femoral; right sheath was later exchanged for a 7 [place] renal double curve destination sheath  Contrast: 115 cc  Fluoroscopy Time: 24.6 minutes  Indications: The patient presented with increasing pain in his lower extremities and a significant deterioration of his noninvasive studies and physical examination.  This suggest he has bilateral limb threatening ischemia.  Noninvasive studies were suggestive of subtotal distal aortic occlusion with significant distal disease.  His primary complaint at the time he presented to the office was worsening severe abdominal pain typically postprandial and associated with a dramatic weight loss.   Noninvasive studies supported profound visceral atherosclerotic occlusive disease.  Is therefore undergoing angiography with the hope for intervention of the visceral arteries.  I have also discussed extensively with him the possible need to reconstruct his distal aorta just to allow the catheters and sheaths to pass through this area to get to the visceral arteries.  The risks and benefits were reviewed all questions were answered the patient has agreed to proceed with angiography and intervention.  Procedure:  Samuel Bryanis a 84 y.o. male who was identified and appropriate procedural time out was performed.  The patient was then placed supine on the table and prepped and draped in the usual sterile fashion.  Ultrasound was used to evaluate the right common femoral artery.  It was echolucent and pulsatile indicating it is patent .  An ultrasound image was acquired for the permanent record.  A micropuncture needle was used to access the right common femoral artery under direct ultrasound guidance.  The microwire was then advanced under fluoroscopic guidance without difficulty followed by the micro-sheath  A 0.035 J wire was advanced without resistance and a 5Fr sheath was placed.    The pigtail catheter was then positioned at the level of T12 and an AP image of the aorta was obtained. After review the images the pigtail catheter was repositioned above the aortic bifurcation and bilateral oblique views of the pelvis were obtained. Subsequently the detector was returned to the AP position and bilateral lower extremity runoff was obtained.  After review the images the ultrasound was reprepped and delivered back onto the sterile field. The left common femoral was then imaged with the ultrasound it was noted to be echolucent and pulsatile indicating patency. Images recorded for the permanent  record. Under real-time visualization a microneedle was inserted into the anterior wall the common femoral artery  microwire was then advanced without difficulty under fluoroscopic guidance followed by placement of the micro-sheath.  A 0.035 Magic torque wire was then negotiated under fluoroscopic guidance into the aorta.  7 French sheath was then placed.  5000 units of heparin was given and allowed to circulate for proximally 4 minutes.  The right sheath was then upsized to a 7 [place] sheath as well after a 0.035 Magic torque wire was advanced through the pigtail catheter. Magnified images of the aortic bifurcation were then made using hand injection contrast from the femoral sheaths. After appropriate sizing a 7 mm x 58 mm lifestream stent was selected for the right and a 7 mm x 58 mm lifestream stent was selected for the left. There were then advanced and positioned just above the aortic bifurcation. Insufflation for full expansion of the stents was performed simultaneously. Follow-up imaging was then performed and the stents appeared somewhat undersized and 8 mm x 60 mm Ultraverse balloons were selected and advanced simultaneously of the right and left.  Simultaneous inflation to 14 atm was then performed for 30 seconds.  The pigtail catheter was then introduced up the right and bolus injection of contrast was used to perform final imaging of the distal aortic reconstruction this showed less than 10% residual stenosis bilaterally and attention was turned to the celiac artery.  Pigtail catheter was reintroduced and advanced to the level of T10 AP projection of the aorta was obtained after reviewing this image the detector was positioned in the true lateral.  Magic torque wire was advanced through the pigtail catheter and the pigtail catheter was removed and the 7 [place] Pinnacle sheath exchanged for a 7 [place] destination sheath.  V S1 catheter was then advanced over the Magic torque wire the Magic torque wire removed and the V S1 catheter reformed.  Glidewire was then introduced and using the Glidewire and V S1  catheter the ostia of the celiac was selected hand-injection of contrast was performed through the V S1 catheter to ensure intraluminal positioning.  The wire was then advanced out into the splenic and the sheath advanced up and over the V S1 catheter wire combination.  Catheter was then removed.  A 6 mm x 16 mm lifestream stent was then selected advanced across the ostia the sheath was then pulled back and the stent deployed.  Follow-up imaging demonstrated that the lesion was slightly longer than it appeared in the stent did not protrude any into the aorta therefore a 7 mm x 16 mm lifestream stent was advanced so that it protruded approximately 3 mm out into the aorta it was then deployed.  Follow-up imaging demonstrated essentially 0 residual stenosis with rapid filling of the celiac.  Oblique views were then obtained of the groins in succession and Star close device is deployed without difficulty. There were no immediate complications   Findings:   Aortogram:  The abdominal aorta is opacified with a bolus injection contrast. Demonstrates diffuse disease but there are no hemodynamically significant lesions noted until the distal aortic bifurcation where the distal aorta demonstrates increasing disease and bilateral greater than 95% ostial common iliac lesions are identified. There is moderate poststenotic dilatation noted of the common iliac arteries as well.  The distal common iliac arteries and the external iliac arteries demonstrate diffuse disease but there are no hemodynamically significant lesions noted.  Bilateral common femoral arteries demonstrate bulky atherosclerotic changes  in their distal half.  AP aortogram demonstrates very poor filling of the celiac SMA is nonfilling.  This is confirmed in the lateral view where there is greater than 95% stenosis of the origin of the celiac and flush occlusion of the SMA.   Following placement of the iliac stents there is now wide patency with less than  10% residual stenosis with rapid flow through the aortic bifurcation bilaterally.  Following angioplasty and stent placement within the celiac there is now less than 5% residual stenosis with rapid filling of the celiac and there is now filling into the SMA which can be identified.  Summary:  Successful reconstruction of the distal aorta and bilateral iliac arteries as well as the celiac artery  Disposition: Patient was taken to the recovery room in stable condition having tolerated the procedure well.  Gregory Schnier 08/19/2018,1:37 PM 

## 2018-08-19 NOTE — Discharge Instructions (Signed)
°Moderate Conscious Sedation, Adult, Care After °These instructions provide you with information about caring for yourself after your procedure. Your health care provider may also give you more specific instructions. Your treatment has been planned according to current medical practices, but problems sometimes occur. Call your health care provider if you have any problems or questions after your procedure. °What can I expect after the procedure? °After your procedure, it is common: °· To feel sleepy for several hours. °· To feel clumsy and have poor balance for several hours. °· To have poor judgment for several hours. °· To vomit if you eat too soon. ° °Follow these instructions at home: °For at least 24 hours after the procedure: ° °· Do not: °? Participate in activities where you could fall or become injured. °? Drive. °? Use heavy machinery. °? Drink alcohol. °? Take sleeping pills or medicines that cause drowsiness. °? Make important decisions or sign legal documents. °? Take care of children on your own. °· Rest. °Eating and drinking °· Follow the diet recommended by your health care provider. °· If you vomit: °? Drink water, juice, or soup when you can drink without vomiting. °? Make sure you have little or no nausea before eating solid foods. °General instructions °· Have a responsible adult stay with you until you are awake and alert. °· Take over-the-counter and prescription medicines only as told by your health care provider. °· If you smoke, do not smoke without supervision. °· Keep all follow-up visits as told by your health care provider. This is important. °Contact a health care provider if: °· You keep feeling nauseous or you keep vomiting. °· You feel light-headed. °· You develop a rash. °· You have a fever. °Get help right away if: °· You have trouble breathing. °This information is not intended to replace advice given to you by your health care provider. Make sure you discuss any questions you  have with your health care provider. °Document Released: 08/05/2013 Document Revised: 03/19/2016 Document Reviewed: 02/04/2016 °Elsevier Interactive Patient Education © 2018 Elsevier [employer] ° ° ° °Femoral Site Care °Refer to this sheet in the next few weeks. These instructions provide you with information about caring for yourself after your procedure. Your health care provider may also give you more specific instructions. Your treatment has been planned according to current medical practices, but problems sometimes occur. Call your health care provider if you have any problems or questions after your procedure. °What can I expect after the procedure? °After your procedure, it is typical to have the following: °· Bruising at the site that usually fades within 1-2 weeks. °· Blood collecting in the tissue (hematoma) that may be painful to the touch. It should usually decrease in size and tenderness within 1-2 weeks. ° °Follow these instructions at home: °· Take medicines only as directed by your health care provider. °· You may shower 24-48 hours after the procedure or as directed by your health care provider. Remove the bandage (dressing) and gently wash the site with plain soap and water. Pat the area dry with a clean towel. Do not rub the site, because this may cause bleeding. °· Do not take baths, swim, or use a hot tub until your health care provider approves. °· Check your insertion site every day for redness, swelling, or drainage. °· Do not apply powder or lotion to the site. °· Limit use of stairs to twice a day for the first 2-3 days or as directed by your health care   provider. °· Do not squat for the first 2-3 days or as directed by your health care provider. °· Do not lift over 10 lb (4.5 kg) for 5 days after your procedure or as directed by your health care provider. °· Ask your health care provider when it is okay to: °? Return to work or school. °? Resume usual physical activities or sports. °? Resume  sexual activity. °· Do not drive home if you are discharged the same day as the procedure. Have someone else drive you. °· You may drive 24 hours after the procedure unless otherwise instructed by your health care provider. °· Do not operate machinery or power tools for 24 hours after the procedure or as directed by your health care provider. °· If your procedure was done as an outpatient procedure, which means that you went home the same day as your procedure, a responsible adult should be with you for the first 24 hours after you arrive home. °· Keep all follow-up visits as directed by your health care provider. This is important. °Contact a health care provider if: °· You have a fever. °· You have chills. °· You have increased bleeding from the site. Hold pressure on the site. °Get help right away if: °· You have unusual pain at the site. °· You have redness, warmth, or swelling at the site. °· You have drainage (other than a small amount of blood on the dressing) from the site. °· The site is bleeding, and the bleeding does not stop after 30 minutes of holding steady pressure on the site. °· Your leg or foot becomes pale, cool, tingly, or numb. °This information is not intended to replace advice given to you by your health care provider. Make sure you discuss any questions you have with your health care provider. °Document Released: 06/18/2014 Document Revised: 03/22/2016 Document Reviewed: 05/04/2014 °Elsevier Interactive Patient Education © 2018 Elsevier Inc. ° °

## 2018-08-20 ENCOUNTER — Encounter: Payer: Self-pay | Admitting: Vascular Surgery

## 2018-08-21 ENCOUNTER — Ambulatory Visit: Payer: Medicare Other | Admitting: Family Medicine

## 2018-08-21 ENCOUNTER — Encounter: Payer: Self-pay | Admitting: Family Medicine

## 2018-08-21 VITALS — BP 132/64 | HR 64 | Temp 97.4°F | Ht 66.0 in | Wt 137.7 lb

## 2018-08-21 DIAGNOSIS — I10 Essential (primary) hypertension: Secondary | ICD-10-CM

## 2018-08-21 DIAGNOSIS — Z5181 Encounter for therapeutic drug level monitoring: Secondary | ICD-10-CM

## 2018-08-21 DIAGNOSIS — E1122 Type 2 diabetes mellitus with diabetic chronic kidney disease: Secondary | ICD-10-CM

## 2018-08-21 DIAGNOSIS — I739 Peripheral vascular disease, unspecified: Secondary | ICD-10-CM | POA: Diagnosis not present

## 2018-08-21 DIAGNOSIS — N183 Chronic kidney disease, stage 3 unspecified: Secondary | ICD-10-CM

## 2018-08-21 DIAGNOSIS — R809 Proteinuria, unspecified: Secondary | ICD-10-CM

## 2018-08-21 DIAGNOSIS — R05 Cough: Secondary | ICD-10-CM | POA: Diagnosis not present

## 2018-08-21 DIAGNOSIS — D631 Anemia in chronic kidney disease: Secondary | ICD-10-CM

## 2018-08-21 DIAGNOSIS — R059 Cough, unspecified: Secondary | ICD-10-CM

## 2018-08-21 DIAGNOSIS — E782 Mixed hyperlipidemia: Secondary | ICD-10-CM

## 2018-08-21 NOTE — Patient Instructions (Addendum)
I do recommend the swallowing study, but understand that you don't want additional testing right now; watch out for any worsening cough or fever and get to the emergency room right away if any concern about food or liquids going down into the lungs We'll get labs today If you have not heard anything from my staff in a week about any orders/referrals/studies from today, please contact us here to follow-up [phone] Try to limit saturated fats in your diet (bologna, hot dogs, barbeque, cheeseburgers, hamburgers, steak, bacon, sausage, cheese, etc.) and get more fresh fruits, vegetables, and whole grains

## 2018-08-21 NOTE — Progress Notes (Signed)
BP 132/64   Pulse 64   Temp (!) 97.4 F (36.3 C)   Ht 5\' 6"  (1.676 m)   Wt 137 lb 11.2 oz (62.5 kg)   SpO2 97%   BMI 22.23 kg/m    Subjective:    Patient ID: Samuel Lope., male    DOB: 01-04-1927, 84 y.o.   MRN: [id number]  HPI: Samuel Bryan. is a 84 y.o. male  Chief Complaint  Patient presents with  . Follow-up    HPI Patient is here for follow-up  Type 2 diabetes; last A1c was 6 months ago, 6.4 last time; avoiding sodas; brown bread and brown rice instead of white; some dry mouth upon awakening  He coughs some when he is drinking or eating sometimes; clear the throat says the daughter; no food sticking; no lump in throat; daughter does not want any studies  CKD; seeing Dr. Rolly Bryan; anemia chronic, related to CKD  He is always cold; anemia noted; she agrees to thyroid  Depression screen [facility] 2/9 08/21/2018 07/25/2018 01/23/2018 11/28/2017 08/19/2017  Decreased Interest 0 0 0 0 0  Down, Depressed, Hopeless 0 0 0 0 0  PHQ - 2 Score 0 0 0 0 0   Fall Risk  08/21/2018 07/28/2018 07/25/2018 01/23/2018 11/28/2017  Falls in the past year? No No No No No  Number falls in past yr: - - - - -  Injury with Fall? - - - - -    Relevant past medical, surgical, family and social history reviewed Past Medical History:  Diagnosis Date  . Atherosclerosis of renal artery ([place])   . Essential hypertension, benign   . Occlusion and stenosis of carotid artery without mention of cerebral infarction    moderate left ICA stenosis  . Peripheral vascular disease, unspecified ([place])    mild lifestyle limiting claudication  . Pure hypercholesterolemia   . Renal insufficiency   . Type 2 diabetes mellitus ([place])    Past Surgical History:  Procedure Laterality Date  . APPENDECTOMY  1970's  . COLONOSCOPY    . coronary artery stenting  1999   at [facility]   . ENDARTERECTOMY Left 12/18/2017   Procedure: ENDARTERECTOMY CAROTID WITH PATCH;  Surgeon: Samuel Meyer Dolores Lory, MD;  Location: [facility] ORS;   Service: Vascular;  Laterality: Left;  . FACIAL LACERATION REPAIR Left 12/13/2015   performed at [facility] by Dr. Marcial Bryan  . renal angiography    . VISCERAL ANGIOGRAPHY N/A 08/19/2018   Procedure: VISCERAL ANGIOGRAPHY;  Surgeon: Samuel Cabal, MD;  Location: [facility];  Service: Cardiovascular;  Laterality: N/A;   Family History  Problem Relation Age of Onset  . Diabetes Mother   . Heart disease Mother   . Hypertension Brother   . Hyperlipidemia Brother   . Diabetes Brother   . Hypertension Son   . Hypertension Son   . Birth defects Neg Hx   . Stroke Neg Hx    Social History   Tobacco Use  . Smoking status: Former Smoker    Types: Cigarettes    Last attempt to quit: 10/29/1970    Years since quitting: 47.8  . Smokeless tobacco: Never Used  Substance Use Topics  . Alcohol use: No    Alcohol/week: 0.0 standard drinks  . Drug use: No     [facility] from 08/21/2018 in [facility]  AUDIT-C Score  0      Interim medical history since last visit reviewed. Allergies and medications reviewed  Review of Systems Per HPI unless specifically indicated above     Objective:    BP 132/64   Pulse 64   Temp (!) 97.4 F (36.3 C)   Ht 5\' 6"  (1.676 m)   Wt 137 lb 11.2 oz (62.5 kg)   SpO2 97%   BMI 22.23 kg/m   Wt Readings from Last 3 Encounters:  08/21/18 137 lb 11.2 oz (62.5 kg)  08/19/18 138 lb 0.1 oz (62.6 kg)  07/28/18 137 lb 14.4 oz (62.6 kg)    Physical Exam  Constitutional: He appears well-developed and well-nourished. No distress.  Elderly male; no distress; hard of hearing  HENT:  Head: Normocephalic and atraumatic.  Eyes: EOM are normal. No scleral icterus.  Neck: No thyromegaly present.  Cardiovascular: Normal rate and regular rhythm.  Pulmonary/Chest: Effort normal and breath sounds normal.  Abdominal: Soft. Bowel sounds are normal. He exhibits no distension.  Musculoskeletal: He exhibits no edema.  Neurological:  Coordination normal.  Skin: Skin is warm and dry. No pallor.  Psychiatric: He has a normal mood and affect. His behavior is normal. Judgment and thought content normal.   Diabetic Foot Form - Detailed   Diabetic Foot Exam - detailed Diabetic Foot exam was performed with the following findings:  Yes 08/21/2018  1:14 PM  Visual Foot Exam completed.:  Yes  Pulse Foot Exam completed.:  Yes  Right Dorsalis Pedis:  Present Left Dorsalis Pedis:  Present  Sensory Foot Exam Completed.:  Yes Semmes-Weinstein Monofilament Test           Assessment & Plan:   Problem List Items Addressed This Visit      Cardiovascular and Mediastinum   Peripheral vascular disease ([place]) (Chronic)   Essential hypertension (Chronic)    Controlled today        Endocrine   Type 2 diabetes mellitus (HCC) (Chronic)    Foot exam by MD; A1c every 6 months      Relevant Orders   Hemoglobin A1c (Completed)   Lipid panel (Completed)     Genitourinary   Chronic kidney disease, stage III (moderate) (HCC) (Chronic)    Managed by nephrologist; avoid NSAIDs; hydrate        Other   Microalbuminuria   Medication monitoring encounter   Relevant Orders   TSH (Completed)   Hyperlipidemia (Chronic)    Limit saturated fats      Anemia in chronic kidney disease (CKD) (Chronic)    chronic       Other Visit Diagnoses    Cough    -  Primary   daughter declines swallow study even though recommended; discussed risk of aspiration; she is to contact me if she changes her mind       Follow up plan: Return in about 6 months (around 02/20/2019) for follow-up visit with Samuel Bryan (or just after).  An after-visit summary was printed and given to the patient at [place].  Please see the patient instructions which may contain other information and recommendations beyond what is mentioned above in the assessment and plan.  No orders of the defined types were placed in this encounter.   Orders Placed This Encounter    Procedures  . TSH  . Hemoglobin A1c  . Lipid panel

## 2018-08-22 LAB — LIPID PANEL
CHOL/HDL RATIO: 3.7 (calc) (ref <5.0)
Cholesterol: 122 mg/dL (ref <200)
HDL: 33 mg/dL — AB (ref >40)
LDL Cholesterol (Calc): 74 mg/dL (calc)
NON-HDL CHOLESTEROL (CALC): 89 mg/dL (ref <130)
TRIGLYCERIDES: 74 mg/dL (ref <150)

## 2018-08-22 LAB — HEMOGLOBIN A1C
HEMOGLOBIN A1C: 6.5 %{Hb} — AB (ref <5.7)
Mean Plasma Glucose: 140 (calc)
eAG (mmol/L): 7.7 (calc)

## 2018-08-22 LAB — TSH: TSH: 3.95 m[IU]/L (ref 0.40–4.50)

## 2018-08-31 NOTE — Assessment & Plan Note (Signed)
Foot exam by MD; A1c every 6 months

## 2018-08-31 NOTE — Assessment & Plan Note (Signed)
Limit saturated fats 

## 2018-08-31 NOTE — Assessment & Plan Note (Signed)
chronic

## 2018-08-31 NOTE — Assessment & Plan Note (Signed)
Managed by nephrologist; avoid NSAIDs; hydrate

## 2018-08-31 NOTE — Assessment & Plan Note (Signed)
Controlled today 

## 2018-09-05 ENCOUNTER — Telehealth: Payer: Self-pay

## 2018-09-05 ENCOUNTER — Other Ambulatory Visit (INDEPENDENT_AMBULATORY_CARE_PROVIDER_SITE_OTHER): Payer: Self-pay | Admitting: Vascular Surgery

## 2018-09-05 ENCOUNTER — Telehealth: Payer: Self-pay | Admitting: Family Medicine

## 2018-09-05 DIAGNOSIS — N183 Chronic kidney disease, stage 3 (moderate): Secondary | ICD-10-CM

## 2018-09-05 DIAGNOSIS — I739 Peripheral vascular disease, unspecified: Secondary | ICD-10-CM

## 2018-09-05 DIAGNOSIS — D649 Anemia, unspecified: Secondary | ICD-10-CM

## 2018-09-05 DIAGNOSIS — E1122 Type 2 diabetes mellitus with diabetic chronic kidney disease: Secondary | ICD-10-CM

## 2018-09-05 DIAGNOSIS — Z5181 Encounter for therapeutic drug level monitoring: Secondary | ICD-10-CM

## 2018-09-05 NOTE — Telephone Encounter (Signed)
Already discussed

## 2018-09-05 NOTE — Telephone Encounter (Signed)
-----   Message from Arnetha Courser, MD sent at 09/04/2018 12:49 PM EST ----- Faythe Ghee, please leave on your desktop and be responsible for making sure these get done and we get those other results, then order what wasn't done. Thank you!  ----- Message ----- From: Cathrine Muster, CMA Sent: 09/04/2018  12:26 PM EST To: Arnetha Courser, MD  Pt daughter states he had some done at [facility] and she will get them to fax so we can see if there is anything else before he gets stuck again

## 2018-09-05 NOTE — Telephone Encounter (Signed)
Copied from [place] [id number]. Topic: Quick Communication - See Telephone Encounter >> Sep 05, 2018 12:18 PM Rutherford Nail, NT wrote: CRM for notification. See Telephone encounter for: 09/05/18. Lowell Guitar, patient's daughter, calling and states that she received a phone call regarding a message about the patient's results. Please advise.

## 2018-09-08 ENCOUNTER — Encounter (INDEPENDENT_AMBULATORY_CARE_PROVIDER_SITE_OTHER): Payer: Self-pay | Admitting: Nurse Practitioner

## 2018-09-08 ENCOUNTER — Ambulatory Visit (INDEPENDENT_AMBULATORY_CARE_PROVIDER_SITE_OTHER): Payer: Medicare Other | Admitting: Nurse Practitioner

## 2018-09-08 ENCOUNTER — Encounter: Payer: Self-pay | Admitting: Family Medicine

## 2018-09-08 ENCOUNTER — Ambulatory Visit (INDEPENDENT_AMBULATORY_CARE_PROVIDER_SITE_OTHER): Payer: Medicare Other

## 2018-09-08 VITALS — BP 115/67 | HR 81 | Resp 16 | Wt 132.0 lb

## 2018-09-08 DIAGNOSIS — N183 Chronic kidney disease, stage 3 unspecified: Secondary | ICD-10-CM

## 2018-09-08 DIAGNOSIS — I739 Peripheral vascular disease, unspecified: Secondary | ICD-10-CM

## 2018-09-08 DIAGNOSIS — K551 Chronic vascular disorders of intestine: Secondary | ICD-10-CM | POA: Diagnosis not present

## 2018-09-08 DIAGNOSIS — E1122 Type 2 diabetes mellitus with diabetic chronic kidney disease: Secondary | ICD-10-CM

## 2018-09-08 DIAGNOSIS — Z87891 Personal history of nicotine dependence: Secondary | ICD-10-CM

## 2018-09-08 DIAGNOSIS — I10 Essential (primary) hypertension: Secondary | ICD-10-CM

## 2018-09-08 DIAGNOSIS — I129 Hypertensive chronic kidney disease with stage 1 through stage 4 chronic kidney disease, or unspecified chronic kidney disease: Secondary | ICD-10-CM

## 2018-09-10 NOTE — Progress Notes (Signed)
Subjective:    Patient ID: Samuel Bryan., male    DOB: 08-28-1927, 84 y.o.   MRN: [id number] Chief Complaint  Patient presents with  . Follow-up    [facility] 2week abi    HPI  Samuel Bryan. is a 84 y.o. male that recently underwent a mesenteric and lower extremity angiogram.  He received a stent to the celiac artery and "kissing stents" of the common illiac arteries. Today, Mr. Samuel Bryan is accompanied by his son and they each state there have been very little issues since the angiogram.  The patient endorses some abdominal pain following the procedure but it has since subsided.  He states that he is eating with less pain and discomfort.  He has yet to begin to regain the weight he lost.    He denies any fever, chills, nausea, or vomiting.  He denies claudication or rest pain like symptoms.  He denies amaurosis fugax or TIA-like symptoms.  Today he underwent bilateral ABIs.  His ABIs bilaterally are noncompressible.  He is TBI on the right is 0.55, his TBI on the left is 0.44.  He has triphasic flows in his right lower extremity with dampened toe waveforms.  He has biphasic waveforms in the posterior tibial artery and peroneal artery and monophasic waveforms in the left anterior tibial artery.  His previous TBI done on 05/29/2017 was 0.66 on the right and 0.46 on the left.  The previous waveforms were triphasic in the right lower extremity and still monophasic in the left anterior tibial artery.  The left posterior tibial had triphasic waveforms.  Past Medical History:  Diagnosis Date  . Atherosclerosis of renal artery ([place])   . Essential hypertension, benign   . Occlusion and stenosis of carotid artery without mention of cerebral infarction    moderate left ICA stenosis  . Peripheral vascular disease, unspecified ([place])    mild lifestyle limiting claudication  . Pure hypercholesterolemia   . Renal insufficiency   . Type 2 diabetes mellitus ([place])     Past Surgical History:  Procedure  Laterality Date  . APPENDECTOMY  1970's  . COLONOSCOPY    . coronary artery stenting  1999   at [facility]   . ENDARTERECTOMY Left 12/18/2017   Procedure: ENDARTERECTOMY CAROTID WITH PATCH;  Surgeon: Delana Meyer Dolores Lory, MD;  Location: [facility] ORS;  Service: Vascular;  Laterality: Left;  . FACIAL LACERATION REPAIR Left 12/13/2015   performed at [facility] by Dr. Marcial Pacas  . renal angiography    . VISCERAL ANGIOGRAPHY N/A 08/19/2018   Procedure: VISCERAL ANGIOGRAPHY;  Surgeon: Katha Cabal, MD;  Location: [place] [facility];  Service: Cardiovascular;  Laterality: N/A;    Social History   Socioeconomic History  . Marital status: Widowed    Spouse name: Not on file  . Number of children: Not on file  . Years of education: Not on file  . Highest education level: Not on file  Occupational History  . Not on file  Social Needs  . Financial resource strain: Not on file  . Food insecurity:    Worry: Not on file    Inability: Not on file  . Transportation needs:    Medical: Not on file    Non-medical: Not on file  Tobacco Use  . Smoking status: Former Smoker    Types: Cigarettes    Last attempt to quit: 10/29/1970    Years since quitting: 47.8  . Smokeless tobacco: Never Used  Substance and Sexual Activity  .  Alcohol use: No    Alcohol/week: 0.0 standard drinks  . Drug use: No  . Sexual activity: Not Currently  Lifestyle  . Physical activity:    Days per week: Not on file    Minutes per session: Not on file  . Stress: Not on file  Relationships  . Social connections:    Talks on phone: Not on file    Gets together: Not on file    Attends religious service: Not on file    Active member of club or organization: Not on file    Attends meetings of clubs or organizations: Not on file    Relationship status: Not on file  . Intimate partner violence:    Fear of current or ex partner: Not on file    Emotionally abused: Not on file    Physically abused: Not on file    Forced sexual  activity: Not on file  Other Topics Concern  . Not on file  Social History Narrative   Married, all children are healthy.    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     Family History  Problem Relation Age of Onset  . Diabetes Mother   . Heart disease Mother   . Hypertension Brother   . Hyperlipidemia Brother   . Diabetes Brother   . Hypertension Son   . Hypertension Son   . Birth defects Neg Hx   . Stroke Neg Hx     No Known Allergies   Review of Systems   Review of Systems: Negative Unless Checked Constitutional: [] Weight loss  [] Fever  [] Chills Cardiac: [] Chest pain   []  Atrial Fibrillation  [] Palpitations   [] Shortness of breath when laying flat   [] Shortness of breath with exertion. Vascular:  [] Pain in legs with walking   [] Pain in legs with standing  [] History of DVT   [] Phlebitis   [] Swelling in legs   [] Varicose veins   [] Non-healing ulcers Pulmonary:   [] Uses home oxygen   [] Productive cough   [] Hemoptysis   [] Wheeze  [] COPD   [] Asthma Neurologic:  [] Dizziness   [] Seizures   [] History of stroke   [] History of TIA  [] Aphasia   [] Vissual changes   [] Weakness or numbness in arm   [] Weakness or numbness in leg Musculoskeletal:   [] Joint swelling   [] Joint pain   [x] Low back pain  []  History of Knee Replacement Hematologic:  [] Easy bruising  [] Easy bleeding   [] Hypercoagulable state   [] Anemic Gastrointestinal:  [] Diarrhea   [] Vomiting  [x] Gastroesophageal reflux/heartburn   [] Difficulty swallowing. Genitourinary:  [x] Chronic kidney disease   [] Difficult urination  [] Anuric   [] Blood in urine Skin:  [] Rashes   [] Ulcers  Psychological:  [] History of anxiety   []  History of major depression  []  Memory Difficulties     Objective:   Physical Exam  BP 115/67 (BP Location: Right Arm)   Pulse 81   Resp 16   Wt 132 lb (59.9 kg)   BMI 21.31 kg/m   Gen: WD/WN, NAD Head: [place]/AT, No temporalis wasting.  Ear/Nose/Throat:  Hearing grossly intact, nares w/o erythema or drainage Eyes: PER, EOMI, sclera nonicteric.  Neck: Supple, no masses.  No JVD.  Pulmonary:  Good air movement, no use of accessory muscles.  Cardiac: RRR Vascular:  Vessel Right Left  Dorsalis Pedis Not Palpable Not Palpable  Posterior Tibial Not Palpable Not Palpable   Gastrointestinal: soft, non-distended. No guarding/no peritoneal signs.  Musculoskeletal: M/S  5/5 throughout.  No deformity or atrophy.  Neurologic: Pain and light touch intact in extremities.  Symmetrical.  Speech is fluent. Motor exam as listed above. Psychiatric: Judgment intact, Mood & affect appropriate for pt's clinical situation. Dermatologic: No Venous rashes. No Ulcers Noted.  No changes consistent with cellulitis. Lymph : No Cervical lymphadenopathy, no lichenification or skin changes of chronic lymphedema.      Assessment & Plan:   1. Chronic mesenteric ischemia (HCC) Recommend:  The patient is status post successful angiogram with intervention of the mesenteric vessels.  celiac stenting  was performed.  The patient reports that the abdominal pain is improved and the post prandial symptoms are essentially gone.   The patient denies lifestyle limiting changes at this point in time.  No further invasive studies, angiography or surgery at this time The patient should continue walking and begin a more formal exercise program.  The patient should continue antiplatelet therapy and aggressive treatment of the lipid abnormalities  Patient should undergo noninvasive studies as ordered. The patient will follow up with me after the studies.   - VAS [place] MESENTERIC; Future  2. Peripheral vascular disease ([place]) Today he underwent bilateral ABIs.  His ABIs bilaterally are noncompressible.  He is TBI on the right is 0.55, his TBI on the left is 0.44.  He has triphasic flows in his right lower extremity with dampened toe waveforms.  He has biphasic waveforms in the posterior  tibial artery and peroneal artery and monophasic waveforms in the left anterior tibial artery.  His previous TBI done on 05/29/2017 was 0.66 on the right and 0.46 on the left.  The previous waveforms were triphasic in the right lower extremity and still monophasic in the left anterior tibial artery.  The left posterior tibial had triphasic waveforms.   Recommend:  The patient is status post successful angiogram with intervention.  The patient reports that the claudication symptoms and leg pain is essentially gone.   The patient denies lifestyle limiting changes at this point in time.  No further invasive studies, angiography or surgery at this time The patient should continue walking and begin a more formal exercise program.  The patient should continue antiplatelet therapy and aggressive treatment of the lipid abnormalities   The patient should continue wearing graduated compression socks 10-15 mmHg strength to control the mild edema.  Patient should undergo noninvasive studies as ordered. The patient will follow up with me after the studies.   - VAS [place] LOWER EXTREMITY ARTERIAL DUPLEX; Future  3. Essential hypertension Continue antihypertensive medications as already ordered, these medications have been reviewed and there are no changes at this time.   4. Type 2 diabetes mellitus with stage 3 chronic kidney disease, without long-term current use of insulin (HCC) Continue hypoglycemic medications as already ordered, these medications have been reviewed and there are no changes at this time.  Hgb A1C to be monitored as already arranged by primary service    Current Outpatient Medications on File Prior to Visit  Medication Sig Dispense Refill  . amLODipine (NORVASC) 2.5 MG tablet Take 1 tablet (2.5 mg total) by mouth daily. 90 tablet 3  . aspirin EC 81 MG tablet Take 81 mg by mouth daily.     Marland Kitchen atorvastatin (LIPITOR) 40 MG tablet Take 1 tablet (40 mg total) by mouth at bedtime. 90  tablet 3  . cholecalciferol (VITAMIN D) 1000 units tablet Take 1,000 Units by mouth daily.    . clopidogrel (PLAVIX) 75 MG tablet  Take 1 tablet (75 mg total) by mouth daily. 30 tablet 4  . furosemide (LASIX) 40 MG tablet Take 40 mg by mouth daily.    . isosorbide mononitrate (IMDUR) 30 MG 24 hr tablet Take 1 tablet (30 mg total) by mouth daily.    Marland Kitchen latanoprost (XALATAN) 0.005 % ophthalmic solution Place 1 drop into both eyes at bedtime.    Marland Kitchen linagliptin (TRADJENTA) 5 MG TABS tablet Take 1 tablet (5 mg total) by mouth daily. For diabetes 90 tablet 3  . losartan (COZAAR) 100 MG tablet Take 100 mg by mouth daily.    . nitroGLYCERIN (NITROSTAT) 0.4 MG SL tablet Place 1 tablet (0.4 mg total) under the tongue every 5 (five) minutes as needed for chest pain. Max of 3 pills; call 911 25 tablet 3  . Omega-3 Fatty Acids (FISH OIL) 1000 MG CAPS Take 1,000 mg by mouth daily.     . timolol (BETIMOL) 0.5 % ophthalmic solution Place 1 drop into both eyes 2 (two) times daily. (Patient taking differently: Place 1 drop into both eyes at bedtime. ) 10 mL 12   No current facility-administered medications on file prior to visit.     There are no Patient Instructions on file for this visit. No follow-ups on file.   Kris Hartmann, NP  This note was completed with Sales executive.  Any errors are purely unintentional.

## 2018-09-11 ENCOUNTER — Encounter (INDEPENDENT_AMBULATORY_CARE_PROVIDER_SITE_OTHER): Payer: Self-pay | Admitting: Nurse Practitioner

## 2018-10-23 ENCOUNTER — Telehealth: Payer: Self-pay | Admitting: Family Medicine

## 2018-10-23 NOTE — Telephone Encounter (Signed)
Please see phone note from November 8th Patient has some outstanding labs Thank you

## 2018-10-27 NOTE — Telephone Encounter (Signed)
Left detailed VM.  

## 2018-10-29 LAB — HEPATIC FUNCTION PANEL
AG RATIO: 1.2 (calc) (ref 1.0–2.5)
ALBUMIN MSPROF: 3.8 g/dL (ref 3.6–5.1)
ALT: 8 U/L — AB (ref 9–46)
AST: 17 U/L (ref 10–35)
Alkaline phosphatase (APISO): 88 U/L (ref 40–115)
Bilirubin, Direct: 0.1 mg/dL (ref 0.0–0.2)
GLOBULIN: 3.1 g/dL (ref 1.9–3.7)
Indirect Bilirubin: 0.3 mg/dL (calc) (ref 0.2–1.2)
Total Bilirubin: 0.4 mg/dL (ref 0.2–1.2)
Total Protein: 6.9 g/dL (ref 6.1–8.1)

## 2018-10-29 LAB — CBC WITH DIFFERENTIAL/PLATELET
ABSOLUTE MONOCYTES: 648 {cells}/uL (ref 200–950)
BASOS PCT: 0.3 %
Basophils Absolute: 11 cells/uL (ref 0–200)
EOS ABS: 141 {cells}/uL (ref 15–500)
Eosinophils Relative: 3.8 %
HCT: 31.6 % — ABNORMAL LOW (ref 38.5–50.0)
HEMOGLOBIN: 10.4 g/dL — AB (ref 13.2–17.1)
LYMPHS ABS: 851 {cells}/uL (ref 850–3900)
MCH: 31.2 pg (ref 27.0–33.0)
MCHC: 32.9 g/dL (ref 32.0–36.0)
MCV: 94.9 fL (ref 80.0–100.0)
MPV: 11.1 fL (ref 7.5–12.5)
Monocytes Relative: 17.5 %
NEUTROS ABS: 2050 {cells}/uL (ref 1500–7800)
Neutrophils Relative %: 55.4 %
Platelets: 250 10*3/uL (ref 140–400)
RBC: 3.33 10*6/uL — AB (ref 4.20–5.80)
RDW: 12.8 % (ref 11.0–15.0)
Total Lymphocyte: 23 %
WBC: 3.7 10*3/uL — ABNORMAL LOW (ref 3.8–10.8)

## 2018-10-29 LAB — MICROALBUMIN / CREATININE URINE RATIO
Creatinine, Urine: 93 mg/dL (ref 20–320)
Microalb Creat Ratio: 6 mcg/mg creat (ref <30)
Microalb, Ur: 0.6 mg/dL

## 2018-10-30 ENCOUNTER — Telehealth: Payer: Self-pay

## 2018-10-30 DIAGNOSIS — D649 Anemia, unspecified: Secondary | ICD-10-CM

## 2018-10-30 DIAGNOSIS — D709 Neutropenia, unspecified: Secondary | ICD-10-CM

## 2018-10-30 NOTE — Telephone Encounter (Signed)
-----   Message from Arnetha Courser, MD sent at 10/30/2018 12:21 PM EST ----- Roselyn Reef, please let the patient know that he is not spilling excessive protein through his kidneys (good news) His liver enzymes are okay He is still anemic, with low white blood cell count; this appears to be chronic, and was first documented in February 2012 (almost 8 years ago) Has he ever seen a hematologist? If not, please recommend referral and REFER for neutropenia and anemia

## 2018-10-31 ENCOUNTER — Telehealth (INDEPENDENT_AMBULATORY_CARE_PROVIDER_SITE_OTHER): Payer: Self-pay

## 2018-10-31 ENCOUNTER — Telehealth: Payer: Self-pay

## 2018-10-31 NOTE — Telephone Encounter (Signed)
We will still proceed with referral to hematologist

## 2018-10-31 NOTE — Telephone Encounter (Signed)
Copied from [place] [id number]. Topic: General - Other >> Oct 31, 2018  8:48 AM Antonieta Iba C wrote: Reason for CRM: pt's spouse called in to update CMA / PCP that pt has been anemic before. She said that they received a call yesterday from the office. She forgot when asked.

## 2018-10-31 NOTE — Telephone Encounter (Signed)
Samuel Bryan called and left a message on the triage line and stated she would like to have the patients next appointment left on her voicemail. I left the message for her with his next appointment which per DPR in chart is allowed by patient.

## 2018-11-10 ENCOUNTER — Inpatient Hospital Stay: Payer: Medicare Other | Attending: Oncology | Admitting: Oncology

## 2018-11-10 ENCOUNTER — Encounter (INDEPENDENT_AMBULATORY_CARE_PROVIDER_SITE_OTHER): Payer: Self-pay

## 2018-11-10 ENCOUNTER — Inpatient Hospital Stay: Payer: Medicare Other

## 2018-11-10 ENCOUNTER — Encounter: Payer: Self-pay | Admitting: Oncology

## 2018-11-10 VITALS — BP 155/73 | HR 67 | Temp 97.4°F | Resp 18 | Ht 68.5 in | Wt 138.0 lb

## 2018-11-10 DIAGNOSIS — D509 Iron deficiency anemia, unspecified: Secondary | ICD-10-CM | POA: Diagnosis present

## 2018-11-10 DIAGNOSIS — E785 Hyperlipidemia, unspecified: Secondary | ICD-10-CM | POA: Diagnosis not present

## 2018-11-10 DIAGNOSIS — N183 Chronic kidney disease, stage 3 (moderate): Secondary | ICD-10-CM | POA: Diagnosis not present

## 2018-11-10 DIAGNOSIS — E119 Type 2 diabetes mellitus without complications: Secondary | ICD-10-CM | POA: Insufficient documentation

## 2018-11-10 DIAGNOSIS — Z87891 Personal history of nicotine dependence: Secondary | ICD-10-CM | POA: Insufficient documentation

## 2018-11-10 DIAGNOSIS — Z79899 Other long term (current) drug therapy: Secondary | ICD-10-CM | POA: Diagnosis not present

## 2018-11-10 DIAGNOSIS — D72819 Decreased white blood cell count, unspecified: Secondary | ICD-10-CM | POA: Diagnosis not present

## 2018-11-10 DIAGNOSIS — I10 Essential (primary) hypertension: Secondary | ICD-10-CM | POA: Insufficient documentation

## 2018-11-10 DIAGNOSIS — D649 Anemia, unspecified: Secondary | ICD-10-CM | POA: Diagnosis not present

## 2018-11-10 DIAGNOSIS — I739 Peripheral vascular disease, unspecified: Secondary | ICD-10-CM | POA: Insufficient documentation

## 2018-11-10 DIAGNOSIS — Z7982 Long term (current) use of aspirin: Secondary | ICD-10-CM

## 2018-11-10 LAB — COMPREHENSIVE METABOLIC PANEL
ALT: 11 U/L (ref 0–44)
ANION GAP: 6 (ref 5–15)
AST: 19 U/L (ref 15–41)
Albumin: 4.1 g/dL (ref 3.5–5.0)
Alkaline Phosphatase: 81 U/L (ref 38–126)
BUN: 37 mg/dL — ABNORMAL HIGH (ref 8–23)
CHLORIDE: 104 mmol/L (ref 98–111)
CO2: 31 mmol/L (ref 22–32)
Calcium: 9.5 mg/dL (ref 8.9–10.3)
Creatinine, Ser: 1.66 mg/dL — ABNORMAL HIGH (ref 0.61–1.24)
GFR calc non Af Amer: 36 mL/min — ABNORMAL LOW (ref >60)
GFR, EST AFRICAN AMERICAN: 42 mL/min — AB (ref >60)
Glucose, Bld: 115 mg/dL — ABNORMAL HIGH (ref 70–99)
Potassium: 4.5 mmol/L (ref 3.5–5.1)
SODIUM: 141 mmol/L (ref 135–145)
Total Bilirubin: 0.7 mg/dL (ref 0.3–1.2)
Total Protein: 8 g/dL (ref 6.5–8.1)

## 2018-11-10 LAB — IRON AND TIBC
Iron: 72 ug/dL (ref 45–182)
Saturation Ratios: 23 % (ref 17.9–39.5)
TIBC: 318 ug/dL (ref 250–450)
UIBC: 246 ug/dL

## 2018-11-10 LAB — CBC WITH DIFFERENTIAL/PLATELET
Abs Immature Granulocytes: 0.01 10*3/uL (ref 0.00–0.07)
BASOS ABS: 0 10*3/uL (ref 0.0–0.1)
Basophils Relative: 0 %
EOS ABS: 0.2 10*3/uL (ref 0.0–0.5)
Eosinophils Relative: 4 %
HCT: 35.2 % — ABNORMAL LOW (ref 39.0–52.0)
HEMOGLOBIN: 10.7 g/dL — AB (ref 13.0–17.0)
IMMATURE GRANULOCYTES: 0 %
LYMPHS ABS: 0.7 10*3/uL (ref 0.7–4.0)
LYMPHS PCT: 21 %
MCH: 30.2 pg (ref 26.0–34.0)
MCHC: 30.4 g/dL (ref 30.0–36.0)
MCV: 99.4 fL (ref 80.0–100.0)
Monocytes Absolute: 0.7 10*3/uL (ref 0.1–1.0)
Monocytes Relative: 20 %
NEUTROS PCT: 55 %
NRBC: 0 % (ref 0.0–0.2)
Neutro Abs: 1.9 10*3/uL (ref 1.7–7.7)
Platelets: 230 10*3/uL (ref 150–400)
RBC: 3.54 MIL/uL — ABNORMAL LOW (ref 4.22–5.81)
RDW: 13.7 % (ref 11.5–15.5)
WBC: 3.5 10*3/uL — ABNORMAL LOW (ref 4.0–10.5)

## 2018-11-10 LAB — RETICULOCYTES
Immature Retic Fract: 5 % (ref 2.3–15.9)
RBC.: 3.54 MIL/uL — ABNORMAL LOW (ref 4.22–5.81)
Retic Count, Absolute: 42.1 10*3/uL (ref 19.0–186.0)
Retic Ct Pct: 1.2 % (ref 0.4–3.1)

## 2018-11-10 LAB — FOLATE: Folate: 32 ng/mL (ref >5.9)

## 2018-11-10 LAB — FERRITIN: FERRITIN: 37 ng/mL (ref 24–336)

## 2018-11-10 LAB — TECHNOLOGIST SMEAR REVIEW: Plt Morphology: ADEQUATE

## 2018-11-10 LAB — VITAMIN B12: Vitamin B-12: 253 pg/mL (ref 180–914)

## 2018-11-10 NOTE — Progress Notes (Deleted)
Hematology/Oncology Consult note [facility]  [phone] [phone]  Patient Care Team: Arnetha Courser, MD as PCP - General (Family Medicine) Lavonia Dana, MD as Consulting Physician (Internal Medicine) Yolonda Kida, MD as Consulting Physician (Cardiology) Anell Barr, OD (Optometry) Sanda Klein Satira Anis, MD as Attending Physician (Family Medicine) Bary Castilla, Forest Gleason, MD (General Surgery) Delana Meyer, Dolores Lory, MD (Vascular Surgery)   Name of the patient: Samuel Bryan  [id number]  1927/10/06   Date of visit: 11/10/18  Diagnosis- ***  Chief complaint/ Reason for visit- ***  Heme/Onc history: ***  Interval history- ***  ECOG PS- *** Pain scale- *** Opioid associated constipation- ***  Review of systems- ROS   Current treatment- ***  No Known Allergies   Past Medical History:  Diagnosis Date  . Atherosclerosis of renal artery ([place])   . Essential hypertension, benign   . Occlusion and stenosis of carotid artery without mention of cerebral infarction    moderate left ICA stenosis  . Peripheral vascular disease, unspecified ([place])    mild lifestyle limiting claudication  . Pure hypercholesterolemia   . Renal insufficiency   . Type 2 diabetes mellitus ([place])      Past Surgical History:  Procedure Laterality Date  . APPENDECTOMY  1970's  . COLONOSCOPY    . coronary artery stenting  1999   at [facility]   . ENDARTERECTOMY Left 12/18/2017   Procedure: ENDARTERECTOMY CAROTID WITH PATCH;  Surgeon: Delana Meyer Dolores Lory, MD;  Location: [facility] ORS;  Service: Vascular;  Laterality: Left;  . FACIAL LACERATION REPAIR Left 12/13/2015   performed at [facility] by Dr. Marcial Pacas  . renal angiography    . VISCERAL ANGIOGRAPHY N/A 08/19/2018   Procedure: VISCERAL ANGIOGRAPHY;  Surgeon: Katha Cabal, MD;  Location: [place] [facility];  Service: Cardiovascular;  Laterality: N/A;    Social History   Socioeconomic History  .  Marital status: Widowed    Spouse name: Not on file  . Number of children: Not on file  . Years of education: Not on file  . Highest education level: Not on file  Occupational History  . Not on file  Social Needs  . Financial resource strain: Not on file  . Food insecurity:    Worry: Not on file    Inability: Not on file  . Transportation needs:    Medical: Not on file    Non-medical: Not on file  Tobacco Use  . Smoking status: Former Smoker    Types: Cigarettes    Last attempt to quit: 10/29/1970    Years since quitting: 48.0  . Smokeless tobacco: Never Used  Substance and Sexual Activity  . Alcohol use: No    Alcohol/week: 0.0 standard drinks  . Drug use: No  . Sexual activity: Not Currently  Lifestyle  . Physical activity:    Days per week: Not on file    Minutes per session: Not on file  . Stress: Not on file  Relationships  . Social connections:    Talks on phone: Not on file    Gets together: Not on file    Attends religious service: Not on file    Active member of club or organization: Not on file    Attends meetings of clubs or organizations: Not on file    Relationship status: Not on file  . Intimate partner violence:    Fear of current or ex partner: Not on file    Emotionally abused: Not  on file    Physically abused: Not on file    Forced sexual activity: Not on file  Other Topics Concern  . Not on file  Social History Narrative   Married, all children are healthy.    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     Family History  Problem Relation Age of Onset  . Diabetes Mother   . Heart disease Mother   . Hypertension Brother   . Hyperlipidemia Brother   . Diabetes Brother   . Hypertension Son   . Hypertension Son   . Birth defects Neg Hx   . Stroke Neg Hx      Current Outpatient Medications:  .  amLODipine (NORVASC) 2.5 MG tablet, Take 1 tablet (2.5 mg total) by mouth daily., Disp: 90 tablet,  Rfl: 3 .  aspirin EC 81 MG tablet, Take 81 mg by mouth daily. , Disp: , Rfl:  .  atorvastatin (LIPITOR) 40 MG tablet, Take 1 tablet (40 mg total) by mouth at bedtime., Disp: 90 tablet, Rfl: 3 .  cholecalciferol (VITAMIN D) 1000 units tablet, Take 1,000 Units by mouth daily., Disp: , Rfl:  .  clopidogrel (PLAVIX) 75 MG tablet, Take 1 tablet (75 mg total) by mouth daily., Disp: 30 tablet, Rfl: 4 .  furosemide (LASIX) 40 MG tablet, Take 40 mg by mouth daily., Disp: , Rfl:  .  isosorbide mononitrate (IMDUR) 30 MG 24 hr tablet, Take 1 tablet (30 mg total) by mouth daily., Disp: , Rfl:  .  latanoprost (XALATAN) 0.005 % ophthalmic solution, Place 1 drop into both eyes at bedtime., Disp: , Rfl:  .  linagliptin (TRADJENTA) 5 MG TABS tablet, Take 1 tablet (5 mg total) by mouth daily. For diabetes, Disp: 90 tablet, Rfl: 3 .  losartan (COZAAR) 100 MG tablet, Take 100 mg by mouth daily., Disp: , Rfl:  .  nitroGLYCERIN (NITROSTAT) 0.4 MG SL tablet, Place 1 tablet (0.4 mg total) under the tongue every 5 (five) minutes as needed for chest pain. Max of 3 pills; call 911, Disp: 25 tablet, Rfl: 3 .  Omega-3 Fatty Acids (FISH OIL) 1000 MG CAPS, Take 1,000 mg by mouth daily. , Disp: , Rfl:  .  timolol (BETIMOL) 0.5 % ophthalmic solution, Place 1 drop into both eyes 2 (two) times daily. (Patient taking differently: Place 1 drop into both eyes at bedtime. ), Disp: 10 mL, Rfl: 12  Physical exam:  Vitals:   11/10/18 1055  BP: (!) 155/73  Pulse: 67  Resp: 18  Temp: (!) 97.4 F (36.3 C)  TempSrc: Tympanic  Weight: 138 lb (62.6 kg)  Height: 5' 8.5" (1.74 m)   Physical Exam   CMP Latest Ref Rng & Units 11/10/2018  Glucose 70 - 99 mg/dL 115(H)  BUN 8 - 23 mg/dL 37(H)  Creatinine 0.61 - 1.24 mg/dL 1.66(H)  Sodium 135 - 145 mmol/L 141  Potassium 3.5 - 5.1 mmol/L 4.5  Chloride 98 - 111 mmol/L 104  CO2 22 - 32 mmol/L 31  Calcium 8.9 - 10.3 mg/dL 9.5  Total Protein 6.5 - 8.1 g/dL 8.0  Total Bilirubin 0.3 - 1.2  mg/dL 0.7  Alkaline Phos 38 - 126 U/L 81  AST 15 - 41 U/L 19  ALT 0 - 44 U/L 11   CBC Latest Ref Rng & Units 11/10/2018  WBC 4.0 - 10.5 K/uL 3.5(L)  Hemoglobin 13.0 - 17.0 g/dL 10.7(L)  Hematocrit 39.0 - 52.0 % 35.2(L)  Platelets 150 - 400 K/uL 230    No images are attached to the encounter.  No results found.   Assessment and plan- Patient is a 85 y.o. male ***   Visit Diagnosis 1. Normocytic anemia   2. Leukopenia, unspecified type      Dr. Randa Evens, MD, MPH [facility] at [facility] [phone] 11/10/2018 1:07 PM

## 2018-11-10 NOTE — Progress Notes (Signed)
Hematology/Oncology Consult note [facility] Telephone:([phone] [phone]  Patient Care Team: Arnetha Courser, MD as PCP - General (Family Medicine) Lavonia Dana, MD as Consulting Physician (Internal Medicine) Yolonda Kida, MD as Consulting Physician (Cardiology) Anell Barr, OD (Optometry) Sanda Klein Satira Anis, MD as Attending Physician (Family Medicine) Bary Castilla Forest Gleason, MD (General Surgery) Delana Meyer, Dolores Lory, MD (Vascular Surgery)   Name of the patient: Samuel Bryan  [id number]  1927/03/30    Reason for referral-leukopenia   Referring physician-Dr. Harlow Mares  Date of visit: 11/10/18   History of presenting illness-patient is an 85 year old African-American male with a past medical history significant for CKD stage III, peripheral vascular disease, hypertension hyperlipidemia among other medical problems.He has been referred to [place] for evaluation of leukopenia.  Recent CBC from 10/28/2018 showed white count of 3.7, H&H of 10.4/31.6 and a platelet count of 250.  Looking back at his prior white count patient has had a few low values of 3.41 dating back to 2013 and his white count waxes and wanes.  His hemoglobin of late has been around 10 likely secondary to his chronic kidney disease.  He is active for his age and lives with his daughter.  He is independent of his ADLs.  Reports that he has a good appetite and denies any unintentional weight loss.  Denies any recurrent infections or hospitalizations.  ECOG PS- 1  Pain scale- 0   Review of systems- Review of Systems  Constitutional: Positive for malaise/fatigue. Negative for chills, fever and weight loss.  HENT: Negative for congestion, ear discharge and nosebleeds.   Eyes: Negative for blurred vision.  Respiratory: Negative for cough, hemoptysis, sputum production, shortness of breath and wheezing.   Cardiovascular: Negative for chest pain, palpitations, orthopnea and claudication.    Gastrointestinal: Negative for abdominal pain, blood in stool, constipation, diarrhea, heartburn, melena, nausea and vomiting.  Genitourinary: Negative for dysuria, flank pain, frequency, hematuria and urgency.  Musculoskeletal: Negative for back pain, joint pain and myalgias.  Skin: Negative for rash.  Neurological: Negative for dizziness, tingling, focal weakness, seizures, weakness and headaches.  Endo/Heme/Allergies: Does not bruise/bleed easily.  Psychiatric/Behavioral: Negative for depression and suicidal ideas. The patient does not have insomnia.     No Known Allergies  Patient Active Problem List   Diagnosis Date Noted  . Aortic atherosclerosis ([place]) 07/28/2018  . Cholelithiasis 07/28/2018  . Pancreatic lesion 07/28/2018  . Chronic mesenteric ischemia ([place]) 07/28/2018  . Full code status 01/23/2018  . Memory loss 01/23/2018  . Carotid stenosis, asymptomatic, left 12/18/2017  . Degenerative arthritis of left shoulder region 08/19/2017  . Microalbuminuria 08/19/2017  . On angiotensin receptor blockers (ARB) 08/19/2017  . Type 2 diabetes mellitus ([place]) 08/13/2017  . Preventative health care 12/11/2016  . Abdominal mass 04/11/2016  . Abdominal mass, right upper quadrant 04/03/2016  . Bilateral carotid bruits 04/03/2016  . Medication monitoring encounter 04/03/2016  . Facial injury 10/26/2015  . 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 Arteriosclerosis of coronary artery 10/11/2015  . H/O angina pectoris 10/11/2015  . Hyperlipidemia 09/13/2015  . Chronic kidney disease, stage III (moderate) ([place]) 09/13/2015  . RENAL ATHEROSCLEROSIS 03/16/2009  . Essential hypertension 03/15/2009  . Coronary atherosclerosis 03/15/2009  . Carotid arterial disease ([place]) 03/15/2009  . Peripheral vascular disease ([place]) 03/15/2009  . Anemia in chronic kidney disease (CKD) 03/15/2009     Past Medical History:  Diagnosis Date  . Atherosclerosis of renal artery ([place])   . Essential  hypertension, benign   .  Occlusion and stenosis of carotid artery without mention of cerebral infarction    moderate left ICA stenosis  . Peripheral vascular disease, unspecified ([place])    mild lifestyle limiting claudication  . Pure hypercholesterolemia   . Renal insufficiency   . Type 2 diabetes mellitus ([place])      Past Surgical History:  Procedure Laterality Date  . APPENDECTOMY  1970's  . COLONOSCOPY    . coronary artery stenting  1999   at [facility]   . ENDARTERECTOMY Left 12/18/2017   Procedure: ENDARTERECTOMY CAROTID WITH PATCH;  Surgeon: Delana Meyer Dolores Lory, MD;  Location: [facility] ORS;  Service: Vascular;  Laterality: Left;  . FACIAL LACERATION REPAIR Left 12/13/2015   performed at [facility] by Dr. Marcial Pacas  . renal angiography    . VISCERAL ANGIOGRAPHY N/A 08/19/2018   Procedure: VISCERAL ANGIOGRAPHY;  Surgeon: Katha Cabal, MD;  Location: [place] [facility];  Service: Cardiovascular;  Laterality: N/A;    Social History   Socioeconomic History  . Marital status: Widowed    Spouse name: Not on file  . Number of children: Not on file  . Years of education: Not on file  . Highest education level: Not on file  Occupational History  . Not on file  Social Needs  . Financial resource strain: Not on file  . Food insecurity:    Worry: Not on file    Inability: Not on file  . Transportation needs:    Medical: Not on file    Non-medical: Not on file  Tobacco Use  . Smoking status: Former Smoker    Types: Cigarettes    Last attempt to quit: 10/29/1970    Years since quitting: 48.0  . Smokeless tobacco: Never Used  Substance and Sexual Activity  . Alcohol use: No    Alcohol/week: 0.0 standard drinks  . Drug use: No  . Sexual activity: Not Currently  Lifestyle  . Physical activity:    Days per week: Not on file    Minutes per session: Not on file  . Stress: Not on file  Relationships  . Social connections:    Talks on phone: Not on file    Gets together: Not on file     Attends religious service: Not on file    Active member of club or organization: Not on file    Attends meetings of clubs or organizations: Not on file    Relationship status: Not on file  . Intimate partner violence:    Fear of current or ex partner: Not on file    Emotionally abused: Not on file    Physically abused: Not on file    Forced sexual activity: Not on file  Other Topics Concern  . Not on file  Social History Narrative   Married, all children are healthy.    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      Family History  Problem Relation Age of Onset  . Diabetes Mother   . Heart disease Mother   . Hypertension Brother   . Hyperlipidemia Brother   . Diabetes Brother   . Hypertension Son   . Hypertension Son   . Birth defects Neg Hx   . Stroke Neg Hx      Current Outpatient Medications:  .  amLODipine (NORVASC) 2.5 MG tablet, Take 1 tablet (2.5 mg total) by mouth daily., Disp: 90 tablet, Rfl: 3 .  aspirin EC 81 MG tablet, Take  81 mg by mouth daily. , Disp: , Rfl:  .  atorvastatin (LIPITOR) 40 MG tablet, Take 1 tablet (40 mg total) by mouth at bedtime., Disp: 90 tablet, Rfl: 3 .  cholecalciferol (VITAMIN D) 1000 units tablet, Take 1,000 Units by mouth daily., Disp: , Rfl:  .  clopidogrel (PLAVIX) 75 MG tablet, Take 1 tablet (75 mg total) by mouth daily., Disp: 30 tablet, Rfl: 4 .  furosemide (LASIX) 40 MG tablet, Take 40 mg by mouth daily., Disp: , Rfl:  .  isosorbide mononitrate (IMDUR) 30 MG 24 hr tablet, Take 1 tablet (30 mg total) by mouth daily., Disp: , Rfl:  .  latanoprost (XALATAN) 0.005 % ophthalmic solution, Place 1 drop into both eyes at bedtime., Disp: , Rfl:  .  linagliptin (TRADJENTA) 5 MG TABS tablet, Take 1 tablet (5 mg total) by mouth daily. For diabetes, Disp: 90 tablet, Rfl: 3 .  losartan (COZAAR) 100 MG tablet, Take 100 mg by mouth daily., Disp: , Rfl:  .  nitroGLYCERIN (NITROSTAT) 0.4 MG SL  tablet, Place 1 tablet (0.4 mg total) under the tongue every 5 (five) minutes as needed for chest pain. Max of 3 pills; call 911, Disp: 25 tablet, Rfl: 3 .  Omega-3 Fatty Acids (FISH OIL) 1000 MG CAPS, Take 1,000 mg by mouth daily. , Disp: , Rfl:  .  timolol (BETIMOL) 0.5 % ophthalmic solution, Place 1 drop into both eyes 2 (two) times daily. (Patient taking differently: Place 1 drop into both eyes at bedtime. ), Disp: 10 mL, Rfl: 12   Physical exam:  Vitals:   11/10/18 1055  BP: (!) 155/73  Pulse: 67  Resp: 18  Temp: (!) 97.4 F (36.3 C)  TempSrc: Tympanic  Weight: 138 lb (62.6 kg)  Height: 5' 8.5" (1.74 m)   Physical Exam Constitutional:      Comments: Thin elderly gentleman in no acute distress  HENT:     Head: Normocephalic and atraumatic.  Eyes:     Pupils: Pupils are equal, round, and reactive to light.  Neck:     Musculoskeletal: Normal range of motion.  Cardiovascular:     Rate and Rhythm: Normal rate and regular rhythm.     Heart sounds: Normal heart sounds.  Pulmonary:     Effort: Pulmonary effort is normal.     Breath sounds: Normal breath sounds.  Abdominal:     General: Bowel sounds are normal. There is no distension.     Palpations: Abdomen is soft.     Tenderness: There is no abdominal tenderness.     Comments: No palpable splenomegaly  Lymphadenopathy:     Comments: No palpable cervical, supraclavicular, axillary or inguinal adenopathy   Skin:    General: Skin is warm and dry.  Neurological:     Mental Status: He is alert and oriented to person, place, and time.        CMP Latest Ref Rng & Units 11/10/2018  Glucose 70 - 99 mg/dL 115(H)  BUN 8 - 23 mg/dL 37(H)  Creatinine 0.61 - 1.24 mg/dL 1.66(H)  Sodium 135 - 145 mmol/L 141  Potassium 3.5 - 5.1 mmol/L 4.5  Chloride 98 - 111 mmol/L 104  CO2 22 - 32 mmol/L 31  Calcium 8.9 - 10.3 mg/dL 9.5  Total Protein 6.5 - 8.1 g/dL 8.0  Total Bilirubin 0.3 - 1.2 mg/dL 0.7  Alkaline Phos 38 - 126 U/L 81    AST 15 - 41 U/L 19  ALT 0 - 44 U/L  11   CBC Latest Ref Rng & Units 11/10/2018  WBC 4.0 - 10.5 K/uL 3.5(L)  Hemoglobin 13.0 - 17.0 g/dL 10.7(L)  Hematocrit 39.0 - 52.0 % 35.2(L)  Platelets 150 - 400 K/uL 230    Assessment and plan- Patient is a 84 y.o. male referred for leukopenia  On review of prior CBCs patient has had mild intermittent leukopenia with his white count around 3.5.  Differential mainly shows mild neutropenia and/or lymphopenia.  I suspect this is benign and can be monitored conservatively without the need for bone marrow biopsy unless his white count is consistently trending down.  Today I will get a repeat CBC with differential smear review and HIV testing.  He has had prior hepatitis testing which has been negative.  I will also check his B12 and folate levels.  Normocytic anemia: Suspect secondary to chronic kidney disease.  I will check CBC, ferritin and iron studies, B12 and folate, reticulocyte count, haptoglobin and myeloma panel.  I will see the patient back in 2 weeks time to discuss the results of blood work and further management   Thank you for this kind referral and the opportunity to participate in the care of this patient   Visit Diagnosis 1. Normocytic anemia   2. Leukopenia, unspecified type     Dr. Randa Evens, MD, MPH [facility] at [facility] [phone] 11/10/2018 1:19 PM

## 2018-11-10 NOTE — Progress Notes (Signed)
Patient knows he is here to today for low blood counts. Appetite is good. Staes his energy is low, he feels weak a lot of the time. Sleeps good at night. His son, Juleen China is with patient today.

## 2018-11-11 LAB — HAPTOGLOBIN: Haptoglobin: 169 mg/dL (ref 38–329)

## 2018-11-11 LAB — HIV ANTIBODY (ROUTINE TESTING W REFLEX): HIV Screen 4th Generation wRfx: NONREACTIVE

## 2018-11-12 LAB — MULTIPLE MYELOMA PANEL, SERUM
ALBUMIN/GLOB SERPL: 1.2 (ref 0.7–1.7)
ALPHA 1: 0.2 g/dL (ref 0.0–0.4)
Albumin SerPl Elph-Mcnc: 3.9 g/dL (ref 2.9–4.4)
Alpha2 Glob SerPl Elph-Mcnc: 1 g/dL (ref 0.4–1.0)
B-Globulin SerPl Elph-Mcnc: 0.9 g/dL (ref 0.7–1.3)
GAMMA GLOB SERPL ELPH-MCNC: 1.4 g/dL (ref 0.4–1.8)
GLOBULIN, TOTAL: 3.4 g/dL (ref 2.2–3.9)
IGA: 246 mg/dL (ref 61–437)
IGM (IMMUNOGLOBULIN M), SRM: 36 mg/dL (ref 15–143)
IgG (Immunoglobin G), Serum: 1527 mg/dL (ref 700–1600)
Total Protein ELP: 7.3 g/dL (ref 6.0–8.5)

## 2018-11-24 ENCOUNTER — Encounter: Payer: Self-pay | Admitting: Oncology

## 2018-11-24 ENCOUNTER — Other Ambulatory Visit: Payer: Self-pay

## 2018-11-24 ENCOUNTER — Inpatient Hospital Stay (HOSPITAL_BASED_OUTPATIENT_CLINIC_OR_DEPARTMENT_OTHER): Payer: Medicare Other | Admitting: Oncology

## 2018-11-24 VITALS — BP 168/73 | HR 63 | Temp 97.9°F | Resp 12 | Ht 65.5 in | Wt 142.9 lb

## 2018-11-24 DIAGNOSIS — D709 Neutropenia, unspecified: Secondary | ICD-10-CM

## 2018-11-24 DIAGNOSIS — D509 Iron deficiency anemia, unspecified: Secondary | ICD-10-CM | POA: Diagnosis not present

## 2018-11-24 DIAGNOSIS — D72819 Decreased white blood cell count, unspecified: Secondary | ICD-10-CM | POA: Diagnosis not present

## 2018-11-24 DIAGNOSIS — E538 Deficiency of other specified B group vitamins: Secondary | ICD-10-CM | POA: Diagnosis not present

## 2018-11-25 NOTE — Progress Notes (Signed)
Hematology/Oncology Consult note [facility]  Telephone:([phone] [phone]  Patient Care Team: Arnetha Courser, MD as PCP - General (Family Medicine) Lavonia Dana, MD as Consulting Physician (Internal Medicine) Yolonda Kida, MD as Consulting Physician (Cardiology) Anell Barr, OD (Optometry) Sanda Klein Satira Anis, MD as Attending Physician (Family Medicine) Bary Castilla, Forest Gleason, MD (General Surgery) Delana Meyer, Dolores Lory, MD (Vascular Surgery)   Name of the patient: Samuel Bryan  [id number]  July 31, 1927   Date of visit: 11/25/18  Diagnosis-leukopenia likely benign  Chief complaint/ Reason for visit-routine follow-up of leukopenia  Heme/Onc history: Patient is a 85 year old African-American male with a past medical history significant for CKD stage III, peripheral vascular disease, hypertension hyperlipidemia among other medical problems.He has been referred to [place] for evaluation of leukopenia.  Recent CBC from 10/28/2018 showed white count of 3.7, H&H of 10.4/31.6 and a platelet count of 250.  Looking back at his prior white count patient has had a few low values of 3.41 dating back to 2013 and his white count waxes and wanes.  His hemoglobin of late has been around 10 likely secondary to his chronic kidney disease.  He is active for his age and lives with his daughter.  He is independent of his ADLs.  Reports that he has a good appetite and denies any unintentional weight loss.  Denies any recurrent infections or hospitalizations.  Interval history-overall he feels well and denies any specific complaints today other than mild fatigue.  Reports mild intermittent cough  ECOG PS- 1 Pain scale- 0 Opioid associated constipation- no  Review of systems- Review of Systems  Constitutional: Positive for malaise/fatigue. Negative for chills, fever and weight loss.  HENT: Negative for congestion, ear discharge and nosebleeds.   Eyes: Negative for  blurred vision.  Respiratory: Positive for cough. Negative for hemoptysis, sputum production, shortness of breath and wheezing.   Cardiovascular: Negative for chest pain, palpitations, orthopnea and claudication.  Gastrointestinal: Negative for abdominal pain, blood in stool, constipation, diarrhea, heartburn, melena, nausea and vomiting.  Genitourinary: Negative for dysuria, flank pain, frequency, hematuria and urgency.  Musculoskeletal: Negative for back pain, joint pain and myalgias.  Skin: Negative for rash.  Neurological: Negative for dizziness, tingling, focal weakness, seizures, weakness and headaches.  Endo/Heme/Allergies: Does not bruise/bleed easily.  Psychiatric/Behavioral: Negative for depression and suicidal ideas. The patient does not have insomnia.        No Known Allergies   Past Medical History:  Diagnosis Date  . Atherosclerosis of renal artery ([place])   . Essential hypertension, benign   . Occlusion and stenosis of carotid artery without mention of cerebral infarction    moderate left ICA stenosis  . Peripheral vascular disease, unspecified ([place])    mild lifestyle limiting claudication  . Pure hypercholesterolemia   . Renal insufficiency   . Type 2 diabetes mellitus ([place])      Past Surgical History:  Procedure Laterality Date  . APPENDECTOMY  1970's  . COLONOSCOPY    . coronary artery stenting  1999   at [facility]   . ENDARTERECTOMY Left 12/18/2017   Procedure: ENDARTERECTOMY CAROTID WITH PATCH;  Surgeon: Delana Meyer Dolores Lory, MD;  Location: [facility] ORS;  Service: Vascular;  Laterality: Left;  . FACIAL LACERATION REPAIR Left 12/13/2015   performed at [facility] by Dr. Marcial Pacas  . renal angiography    . VISCERAL ANGIOGRAPHY N/A 08/19/2018   Procedure: VISCERAL ANGIOGRAPHY;  Surgeon: Katha Cabal, MD;  Location: [facility];  Service: Cardiovascular;  Laterality: N/A;    Social History   Socioeconomic History  . Marital status: Widowed    Spouse name:  Not on file  . Number of children: Not on file  . Years of education: Not on file  . Highest education level: Not on file  Occupational History  . Not on file  Social Needs  . Financial resource strain: Not on file  . Food insecurity:    Worry: Not on file    Inability: Not on file  . Transportation needs:    Medical: Not on file    Non-medical: Not on file  Tobacco Use  . Smoking status: Former Smoker    Types: Cigarettes    Last attempt to quit: 10/29/1970    Years since quitting: 48.1  . Smokeless tobacco: Never Used  Substance and Sexual Activity  . Alcohol use: No    Alcohol/week: 0.0 standard drinks  . Drug use: No  . Sexual activity: Not Currently  Lifestyle  . Physical activity:    Days per week: Not on file    Minutes per session: Not on file  . Stress: Not on file  Relationships  . Social connections:    Talks on phone: Not on file    Gets together: Not on file    Attends religious service: Not on file    Active member of club or organization: Not on file    Attends meetings of clubs or organizations: Not on file    Relationship status: Not on file  . Intimate partner violence:    Fear of current or ex partner: Not on file    Emotionally abused: Not on file    Physically abused: Not on file    Forced sexual activity: Not on file  Other Topics Concern  . Not on file  Social History Narrative   Married, all children are healthy.    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     Family History  Problem Relation Age of Onset  . Diabetes Mother   . Heart disease Mother   . Hypertension Brother   . Hyperlipidemia Brother   . Diabetes Brother   . Hypertension Son   . Hypertension Son   . Birth defects Neg Hx   . Stroke Neg Hx      Current Outpatient Medications:  .  amLODipine (NORVASC) 2.5 MG tablet, Take 1 tablet (2.5 mg total) by mouth daily., Disp: 90 tablet, Rfl: 3 .  aspirin EC 81 MG tablet, Take  81 mg by mouth daily. , Disp: , Rfl:  .  atorvastatin (LIPITOR) 40 MG tablet, Take 1 tablet (40 mg total) by mouth at bedtime., Disp: 90 tablet, Rfl: 3 .  cholecalciferol (VITAMIN D) 1000 units tablet, Take 1,000 Units by mouth daily., Disp: , Rfl:  .  clopidogrel (PLAVIX) 75 MG tablet, Take 1 tablet (75 mg total) by mouth daily., Disp: 30 tablet, Rfl: 4 .  furosemide (LASIX) 40 MG tablet, Take 40 mg by mouth daily., Disp: , Rfl:  .  isosorbide mononitrate (IMDUR) 30 MG 24 hr tablet, Take 1 tablet (30 mg total) by mouth daily., Disp: , Rfl:  .  latanoprost (XALATAN) 0.005 % ophthalmic solution, Place 1 drop into both eyes at bedtime., Disp: , Rfl:  .  linagliptin (TRADJENTA) 5 MG TABS tablet, Take 1 tablet (5 mg total) by mouth daily. For diabetes, Disp: 90 tablet, Rfl: 3 .  losartan (COZAAR) 100 MG tablet, Take 100 mg by mouth daily., Disp: , Rfl:  .  nitroGLYCERIN (NITROSTAT) 0.4 MG SL tablet, Place 1 tablet (0.4 mg total) under the tongue every 5 (five) minutes as needed for chest pain. Max of 3 pills; call 911, Disp: 25 tablet, Rfl: 3 .  Omega-3 Fatty Acids (FISH OIL) 1000 MG CAPS, Take 1,000 mg by mouth daily. , Disp: , Rfl:  .  timolol (BETIMOL) 0.5 % ophthalmic solution, Place 1 drop into both eyes 2 (two) times daily. (Patient taking differently: Place 1 drop into both eyes at bedtime. ), Disp: 10 mL, Rfl: 12  Physical exam:  Vitals:   11/24/18 0922 11/24/18 0925  BP:  (!) 168/73  Pulse:  63  Resp: 12   Temp:  97.9 F (36.6 C)  TempSrc:  Tympanic  Weight: 142 lb 14.4 oz (64.8 kg)   Height: 5' 5.5" (1.664 m)    Physical Exam Constitutional:      Comments: Thin elderly gentleman in no acute distress  HENT:     Head: Normocephalic and atraumatic.  Eyes:     Pupils: Pupils are equal, round, and reactive to light.  Neck:     Musculoskeletal: Normal range of motion.  Cardiovascular:     Rate and Rhythm: Normal rate and regular rhythm.     Heart sounds: Normal heart sounds.    Pulmonary:     Effort: Pulmonary effort is normal.     Breath sounds: Normal breath sounds.  Abdominal:     General: Bowel sounds are normal.     Palpations: Abdomen is soft.  Skin:    General: Skin is warm and dry.  Neurological:     Mental Status: He is alert and oriented to person, place, and time.      CMP Latest Ref Rng & Units 11/10/2018  Glucose 70 - 99 mg/dL 115(H)  BUN 8 - 23 mg/dL 37(H)  Creatinine 0.61 - 1.24 mg/dL 1.66(H)  Sodium 135 - 145 mmol/L 141  Potassium 3.5 - 5.1 mmol/L 4.5  Chloride 98 - 111 mmol/L 104  CO2 22 - 32 mmol/L 31  Calcium 8.9 - 10.3 mg/dL 9.5  Total Protein 6.5 - 8.1 g/dL 8.0  Total Bilirubin 0.3 - 1.2 mg/dL 0.7  Alkaline Phos 38 - 126 U/L 81  AST 15 - 41 U/L 19  ALT 0 - 44 U/L 11   CBC Latest Ref Rng & Units 11/10/2018  WBC 4.0 - 10.5 K/uL 3.5(L)  Hemoglobin 13.0 - 17.0 g/dL 10.7(L)  Hematocrit 39.0 - 52.0 % 35.2(L)  Platelets 150 - 400 K/uL 230      Assessment and plan- Patient is a 85 y.o. male referred for leukopenia likely benign  Leukopenia: Patient noted to have a mild leukopenia with a white count of 3.5 with an absolute neutrophil count of 1.9.  His B12 levels were low normal at 253 and we have asked him to start taking oral B12 thousand micrograms p.o. daily but I do not think that this is contributing to his leukopenia.  Smear review was unremarkable.  I will continue to monitor his CBC every 6 months.  Patient also noted to have normocytic anemia with a hemoglobin of 10.7.  He does have a low ferritin of 37.  Reticulocyte count was low at 1.2% indicating a hypoproliferative anemia.  Haptoglobin normal at 169.  I will give him a trial of IV iron Feraheme 510 mg x 2 doses weekly.  Discussed risks and  benefits of Feraheme including all but not limited to headache, leg swelling and possible risk of infusion reaction.  Patient understands and agrees to proceed as planned.  I will see him back in 2 months time with repeat CBC with  differential ferritin and iron studies as well as B12 levels   Visit Diagnosis 1. Neutropenia, unspecified type ([place])   2. Iron deficiency anemia, unspecified iron deficiency anemia type   3. B12 deficiency      Dr. Randa Evens, MD, MPH [facility] at [facility] [phone] 11/25/2018 1:15 PM

## 2018-11-28 ENCOUNTER — Inpatient Hospital Stay: Payer: Medicare Other

## 2018-11-28 VITALS — BP 176/79 | HR 66 | Temp 96.4°F | Resp 16

## 2018-11-28 DIAGNOSIS — D72819 Decreased white blood cell count, unspecified: Secondary | ICD-10-CM | POA: Diagnosis not present

## 2018-11-28 DIAGNOSIS — D509 Iron deficiency anemia, unspecified: Secondary | ICD-10-CM

## 2018-11-28 MED ORDER — SODIUM CHLORIDE 0.9 % IV SOLN
Freq: Once | INTRAVENOUS | Status: AC
  Administered 2018-11-28: 13:00:00 via INTRAVENOUS
  Filled 2018-11-28: qty 250

## 2018-11-28 MED ORDER — SODIUM CHLORIDE 0.9 % IV SOLN
510.0000 mg | Freq: Once | INTRAVENOUS | Status: AC
  Administered 2018-11-28: 510 mg via INTRAVENOUS
  Filled 2018-11-28: qty 17

## 2018-12-05 ENCOUNTER — Inpatient Hospital Stay: Payer: Medicare Other | Attending: Oncology

## 2018-12-05 VITALS — BP 151/81 | HR 72 | Resp 18

## 2018-12-05 DIAGNOSIS — D509 Iron deficiency anemia, unspecified: Secondary | ICD-10-CM | POA: Insufficient documentation

## 2018-12-05 MED ORDER — SODIUM CHLORIDE 0.9 % IV SOLN
Freq: Once | INTRAVENOUS | Status: AC
  Administered 2018-12-05: 14:00:00 via INTRAVENOUS
  Filled 2018-12-05: qty 250

## 2018-12-05 MED ORDER — SODIUM CHLORIDE 0.9 % IV SOLN
510.0000 mg | Freq: Once | INTRAVENOUS | Status: AC
  Administered 2018-12-05: 510 mg via INTRAVENOUS
  Filled 2018-12-05: qty 17

## 2018-12-11 ENCOUNTER — Other Ambulatory Visit (INDEPENDENT_AMBULATORY_CARE_PROVIDER_SITE_OTHER): Payer: Self-pay | Admitting: Vascular Surgery

## 2018-12-11 DIAGNOSIS — Z95820 Peripheral vascular angioplasty status with implants and grafts: Secondary | ICD-10-CM

## 2018-12-15 ENCOUNTER — Encounter (INDEPENDENT_AMBULATORY_CARE_PROVIDER_SITE_OTHER): Payer: Medicare Other

## 2018-12-15 ENCOUNTER — Ambulatory Visit (INDEPENDENT_AMBULATORY_CARE_PROVIDER_SITE_OTHER): Payer: Medicare Other | Admitting: Vascular Surgery

## 2018-12-17 ENCOUNTER — Encounter (INDEPENDENT_AMBULATORY_CARE_PROVIDER_SITE_OTHER): Payer: Medicare Other

## 2018-12-17 ENCOUNTER — Ambulatory Visit (INDEPENDENT_AMBULATORY_CARE_PROVIDER_SITE_OTHER): Payer: Medicare Other | Admitting: Nurse Practitioner

## 2018-12-31 ENCOUNTER — Encounter (INDEPENDENT_AMBULATORY_CARE_PROVIDER_SITE_OTHER): Payer: Self-pay | Admitting: Nurse Practitioner

## 2018-12-31 ENCOUNTER — Ambulatory Visit (INDEPENDENT_AMBULATORY_CARE_PROVIDER_SITE_OTHER): Payer: Medicare Other

## 2018-12-31 ENCOUNTER — Ambulatory Visit (INDEPENDENT_AMBULATORY_CARE_PROVIDER_SITE_OTHER): Payer: Medicare Other | Admitting: Nurse Practitioner

## 2018-12-31 ENCOUNTER — Other Ambulatory Visit (INDEPENDENT_AMBULATORY_CARE_PROVIDER_SITE_OTHER): Payer: Self-pay | Admitting: Vascular Surgery

## 2018-12-31 ENCOUNTER — Other Ambulatory Visit: Payer: Self-pay

## 2018-12-31 VITALS — BP 137/61 | HR 60 | Resp 12 | Ht 66.0 in | Wt 140.0 lb

## 2018-12-31 DIAGNOSIS — Z87891 Personal history of nicotine dependence: Secondary | ICD-10-CM

## 2018-12-31 DIAGNOSIS — Z95820 Peripheral vascular angioplasty status with implants and grafts: Secondary | ICD-10-CM | POA: Diagnosis not present

## 2018-12-31 DIAGNOSIS — K551 Chronic vascular disorders of intestine: Secondary | ICD-10-CM

## 2018-12-31 DIAGNOSIS — I739 Peripheral vascular disease, unspecified: Secondary | ICD-10-CM

## 2018-12-31 DIAGNOSIS — I779 Disorder of arteries and arterioles, unspecified: Secondary | ICD-10-CM

## 2018-12-31 DIAGNOSIS — I129 Hypertensive chronic kidney disease with stage 1 through stage 4 chronic kidney disease, or unspecified chronic kidney disease: Secondary | ICD-10-CM

## 2018-12-31 DIAGNOSIS — Z7984 Long term (current) use of oral hypoglycemic drugs: Secondary | ICD-10-CM

## 2018-12-31 DIAGNOSIS — Z7902 Long term (current) use of antithrombotics/antiplatelets: Secondary | ICD-10-CM

## 2018-12-31 DIAGNOSIS — E1122 Type 2 diabetes mellitus with diabetic chronic kidney disease: Secondary | ICD-10-CM

## 2018-12-31 DIAGNOSIS — I10 Essential (primary) hypertension: Secondary | ICD-10-CM

## 2018-12-31 DIAGNOSIS — N183 Chronic kidney disease, stage 3 (moderate): Secondary | ICD-10-CM

## 2018-12-31 DIAGNOSIS — Z79899 Other long term (current) drug therapy: Secondary | ICD-10-CM

## 2018-12-31 NOTE — Progress Notes (Signed)
SUBJECTIVE:  Patient ID: Samuel Bryan., male    DOB: [birth year], 85 y.o.   MRN: [id number] Chief Complaint  Patient presents with  . Follow-up    HPI  Samuel Bryan. is a 85 y.o. 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 He is also being seen for carotid stenosis.  There have been no interval changes in lower extremity symptoms. No interval shortening of the patient's claudication distance or development of rest pain symptoms. No new ulcers or wounds have occurred since the last visit.  The son was present at this visit and also echoed the patient's statements.  he patient denies abdominal pain or postprandial symptoms.  The patient denies weight loss as well as nausea.  The patient does not substantiate food fear, particular foods do not seem to aggravate or alleviate the symptoms.  The patient denies bloody bowel movements or diarrhea.  The patient has a history of colonoscopy which was not diagnostic.  No history of peptic ulcer disease.   There have been no significant changes to the patient's overall health care.  The patient denies amaurosis fugax or recent TIA symptoms. There are no recent neurological changes noted. The patient denies history of DVT, PE or superficial thrombophlebitis. The patient denies recent episodes of angina or shortness of breath.   Duplex ultrasound of the the patient underwent a lower extremity arterial duplex study which revealed 50 to 74% stenosis noted in the right common femoral artery.  No significant stenosis in the left.  Both stents are patent.  He has biphasic waveforms throughout the bilateral lower extremities with the exception of the distal anterior tibial artery has monophasic waveforms.   Carotid Duplex done today shows 1-39% stenosis in the right  carotid artery with no stenosis in the left..  No change compared to last study in 04/07/2018    Past Medical History:  Diagnosis Date  . Atherosclerosis of renal artery ([place])   . Essential hypertension, benign   . Occlusion and stenosis of carotid artery without mention of cerebral infarction    moderate left ICA stenosis  . Peripheral vascular disease, unspecified ([place])    mild lifestyle limiting claudication  . Pure hypercholesterolemia   . Renal insufficiency   . Type 2 diabetes mellitus ([place])     Past Surgical History:  Procedure Laterality Date  . APPENDECTOMY  1970's  . COLONOSCOPY    . coronary artery stenting  1999   at [facility]   . ENDARTERECTOMY Left 12/18/2017   Procedure: ENDARTERECTOMY CAROTID WITH PATCH;  Surgeon: Delana Meyer Dolores Lory, MD;  Location: [facility] ORS;  Service: Vascular;  Laterality: Left;  . FACIAL LACERATION REPAIR Left 12/13/2015   performed at [facility] by Dr. Marcial Pacas  . renal angiography    . VISCERAL ANGIOGRAPHY N/A 08/19/2018   Procedure: VISCERAL ANGIOGRAPHY;  Surgeon: Katha Cabal, MD;  Location: [place] [facility];  Service: Cardiovascular;  Laterality: N/A;    Social History   Socioeconomic History  . Marital status: Widowed    Spouse name: Not on file  . Number of children: Not on file  . Years of education: Not on file  . Highest education level: Not on file  Occupational History  . Not on file  Social Needs  . Financial resource strain: Not on file  . Food insecurity:  Worry: Not on file    Inability: Not on file  . Transportation needs:    Medical: Not on file    Non-medical: Not on file  Tobacco Use  . Smoking status: Former Smoker    Types: Cigarettes    Last attempt to quit: 10/29/1970    Years since quitting: 48.2  . Smokeless tobacco: Never Used  Substance and Sexual Activity  . Alcohol use: No    Alcohol/week: 0.0 standard drinks  . Drug use: No  . Sexual activity: Not Currently  Lifestyle  . Physical activity:      Days per week: Not on file    Minutes per session: Not on file  . Stress: Not on file  Relationships  . Social connections:    Talks on phone: Not on file    Gets together: Not on file    Attends religious service: Not on file    Active member of club or organization: Not on file    Attends meetings of clubs or organizations: Not on file    Relationship status: Not on file  . Intimate partner violence:    Fear of current or ex partner: Not on file    Emotionally abused: Not on file    Physically abused: Not on file    Forced sexual activity: Not on file  Other Topics Concern  . Not on file  Social History Narrative   Married, all children are healthy.    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     Family History  Problem Relation Age of Onset  . Diabetes Mother   . Heart disease Mother   . Hypertension Brother   . Hyperlipidemia Brother   . Diabetes Brother   . Hypertension Son   . Hypertension Son   . Birth defects Neg Hx   . Stroke Neg Hx     No Known Allergies   Review of Systems   Review of Systems: Negative Unless Checked Constitutional: [] Weight loss  [] Fever  [] Chills Cardiac: [] Chest pain   []  Atrial Fibrillation  [] Palpitations   [] Shortness of breath when laying flat   [] Shortness of breath with exertion. [] Shortness of breath at rest Vascular:  [] Pain in legs with walking   [] Pain in legs with standing [] Pain in legs when laying flat   [x] Claudication    [] Pain in feet when laying flat    [] History of DVT   [] Phlebitis   [] Swelling in legs   [] Varicose veins   [] Non-healing ulcers Pulmonary:   [] Uses home oxygen   [] Productive cough   [] Hemoptysis   [] Wheeze  [] COPD   [] Asthma Neurologic:  [] Dizziness   [] Seizures  [] Blackouts [] History of stroke   [] History of TIA  [] Aphasia   [] Temporary Blindness   [] Weakness or numbness in arm   [] Weakness or numbness in leg Musculoskeletal:   [] Joint swelling    [] Joint pain   [] Low back pain  []  History of Knee Replacement [x] Arthritis [] back Surgeries  []  Spinal Stenosis    Hematologic:  [] Easy bruising  [] Easy bleeding   [] Hypercoagulable state   [] Anemic Gastrointestinal:  [] Diarrhea   [] Vomiting  [] Gastroesophageal reflux/heartburn   [] Difficulty swallowing. [] Abdominal pain Genitourinary:  [] Chronic kidney disease   [] Difficult urination  [] Anuric   [] Blood in urine [] Frequent urination  [] Burning with urination   [] Hematuria Skin:  [] Rashes   [] Ulcers [] Wounds Psychological:  [] History of anxiety   []  History of major depression  [  x] Memory Difficulties      OBJECTIVE:   Physical Exam  BP 137/61 (BP Location: Left Arm, Patient Position: Sitting, Cuff Size: Small)   Pulse 60   Resp 12   Ht 5\' 6"  (1.676 m)   Wt 140 lb (63.5 kg)   BMI 22.60 kg/m   Gen: WD/WN, NAD Head: [place]/AT, No temporalis wasting.  Ear/Nose/Throat: Hearing grossly intact, nares w/o erythema or drainage Eyes: PER, EOMI, sclera nonicteric.  Neck: Supple, no masses.  No JVD.  Pulmonary:  Good air movement, no use of accessory muscles.  Cardiac: RRR Vascular:  Vessel Right Left  Radial Palpable Palpable  Dorsalis Pedis Not Palpable Not Palpable  Posterior Tibial Not Palpable Not Palpable   Gastrointestinal: soft, non-distended. No guarding/no peritoneal signs.  Musculoskeletal: M/S 5/5 throughout.  No deformity or atrophy.  Neurologic: Pain and light touch intact in extremities.  Symmetrical.  Speech is fluent. Motor exam as listed above. Psychiatric: Judgment intact, Mood & affect appropriate for pt's clinical situation. Dermatologic: No Venous rashes. No Ulcers Noted.  No changes consistent with cellulitis. Lymph : No Cervical lymphadenopathy, no lichenification or skin changes of chronic lymphedema.       ASSESSMENT AND PLAN:  1. Chronic mesenteric ischemia (HCC) Recommend:  The patient has evidence of chronic asymptomatic mesenteric atherosclerosis.  The  patient denies lifestyle limiting changes at this point in time.  Given the lack of symptoms no intervention is warranted at this time.   No further invasive studies, angiography or surgery at this time  The patient should continue walking and begin a more formal exercise program.   The patient should continue antiplatelet therapy and aggressive treatment of the lipid abnormalities  Unfortunately due to the fact that the patient ate prior to his studies, we were not able to effectively visualize the celiac arteries.  However based upon the patient voicing no lifestyle limiting changes, we will assess in 6 months.  Patient advised to contact us sooner should symptoms develop. - VAS [place] MESENTERIC; Future  2. Peripheral vascular disease ([place])  Recommend:  The patient has evidence of atherosclerosis of the lower extremities with claudication.  The patient does not voice lifestyle limiting changes at this point in time.  Noninvasive studies do not suggest clinically significant change.  No invasive studies, angiography or surgery at this time The patient should continue walking and begin a more formal exercise program.  The patient should continue antiplatelet therapy and aggressive treatment of the lipid abnormalities  No changes in the patient's medications at this time  The patient should continue wearing graduated compression socks 10-15 mmHg strength to control the mild edema.   Patient will follow-up in 6 months with noninvasive studies. - VAS [place] ABI WITH/WO TBI; Future  3. Bilateral carotid artery disease, unspecified type ([place]) Recommend:  Given the patient's asymptomatic subcritical stenosis no further invasive testing or surgery at this time.  Carotid Duplex done today shows 1-39% stenosis in the right carotid artery with no stenosis in the left..  Continue antiplatelet therapy as prescribed Continue management of CAD, HTN and Hyperlipidemia Healthy heart diet,  encouraged  exercise at least 4 times per week Follow up in 12 months with duplex ultrasound and physical exam   4. Essential hypertension Continue antihypertensive medications as already ordered, these medications have been reviewed and there are no changes at this time.   5. Type 2 diabetes mellitus with stage 3 chronic kidney disease, without long-term current use of insulin (HCC) Continue hypoglycemic  medications as already ordered, these medications have been reviewed and there are no changes at this time.  Hgb A1C to be monitored as already arranged by primary service    Current Outpatient Medications on File Prior to Visit  Medication Sig Dispense Refill  . amLODipine (NORVASC) 2.5 MG tablet Take 1 tablet (2.5 mg total) by mouth daily. 90 tablet 3  . aspirin EC 81 MG tablet Take 81 mg by mouth daily.     Marland Kitchen atorvastatin (LIPITOR) 40 MG tablet Take 1 tablet (40 mg total) by mouth at bedtime. 90 tablet 3  . cholecalciferol (VITAMIN D) 1000 units tablet Take 1,000 Units by mouth daily.    . clopidogrel (PLAVIX) 75 MG tablet Take 1 tablet (75 mg total) by mouth daily. 30 tablet 4  . furosemide (LASIX) 40 MG tablet Take 40 mg by mouth daily.    . isosorbide mononitrate (IMDUR) 30 MG 24 hr tablet Take 1 tablet (30 mg total) by mouth daily.    Marland Kitchen latanoprost (XALATAN) 0.005 % ophthalmic solution Place 1 drop into both eyes at bedtime.    Marland Kitchen linagliptin (TRADJENTA) 5 MG TABS tablet Take 1 tablet (5 mg total) by mouth daily. For diabetes 90 tablet 3  . losartan (COZAAR) 100 MG tablet Take 100 mg by mouth daily.    . Omega-3 Fatty Acids (FISH OIL) 1000 MG CAPS Take 1,000 mg by mouth daily.     . timolol (BETIMOL) 0.5 % ophthalmic solution Place 1 drop into both eyes 2 (two) times daily. (Patient taking differently: Place 1 drop into both eyes at bedtime. ) 10 mL 12  . vitamin B-12 (CYANOCOBALAMIN) 1000 MCG tablet Take 1,000 mcg by mouth daily.    . nitroGLYCERIN (NITROSTAT) 0.4 MG SL tablet Place 1 tablet  (0.4 mg total) under the tongue every 5 (five) minutes as needed for chest pain. Max of 3 pills; call 911 25 tablet 3   No current facility-administered medications on file prior to visit.     Patient Instructions  Chronic Mesenteric Ischemia  Mesenteric ischemia is poor blood flow (circulation) in the vessels that supply blood to the stomach, intestines, and liver (mesenteric organs). Chronic mesenteric ischemia, also called mesenteric angina or intestinal angina, is a long-term (chronic) condition. It happens when an artery or vein that provides blood to the mesenteric organs gradually becomes blocked or narrow, restricting the blood supply to the organs. When the blood supply is severely restricted, the mesenteric organs cannot work properly. What are the causes? This condition is commonly caused by fatty deposits that build up in an artery (plaque), which can narrow the artery and restrict blood flow. Other causes include:  Weakened areas in blood vessel walls (aneurysms).  Conditions that cause twisting or inflammation of blood vessels, such as fibromuscular dysplasia or arteritis.  A disorder in which blood clots form in the veins (venous thrombosis).  Scarring and thickening (fibrosis) of blood vessels caused by radiation therapy.  A tear in the aorta, the body's main artery (aortic dissection).  Blood vessel problems after illegal drug use, such as use of cocaine.  Tumors in the nervous system (neurofibromatosis).  Certain autoimmune diseases, such as lupus. What increases the risk? The following factors may make you more likely to develop this condition:  Being male.  Being over age 67, especially if you have a history of heart problems.  Smoking.  Congestive heart failure.  Irregular heartbeat (arrhythmia).  Having a history of heart attack or stroke.  Diabetes.  High cholesterol.  High blood pressure (hypertension).  Being overweight or obese.  Kidney  disease (renal disease) requiring dialysis. What are the signs or symptoms? Symptoms of this condition include:  Abdomen (abdominal) pain or cramps that develop 15-60 minutes after a meal. This pain may last for 1-3 hours. Some people may develop a fear of eating because of this symptom.  Weight loss.  Diarrhea.  Bloody stool.  Nausea.  Vomiting.  Bloating.  Abdominal pain after stress or with exercise. How is this diagnosed? This condition is diagnosed based on:  Your medical history.  A physical exam.  Tests, such as: ? Ultrasound. ? CT scan. ? Blood tests. ? Urine tests. ? An imaging test that involves injecting a dye into your arteries to show blood flow through blood vessels (angiogram). This can help to show if there are any blockages in the vessels that lead to the intestines. ? Passing a small probe through the mouth and into the stomach to measure the output of carbon dioxide (gastric tonometry). This can help to indicate whether there is decreased blood flow to the stomach and intestines. How is this treated? This condition may be treated with:  Dietary changes such as eating smaller, low-fat, meals more frequently.  Lifestyle changes to treat underlying conditions that contribute to the disease, such as high cholesterol and high blood pressure.  Medicines to reduce blood clotting and increase blood flow.  Surgery to remove the blockage, repair arteries or veins, and restore blood flow. This may involve: ? Angioplasty. This is surgery to widen the affected artery, reduce the blockage, and sometimes insert a small, mesh tube (stent). ? Bypass surgery. This may be done to go around (bypass) the blockage and reconnect healthy arteries or veins. ? Placing a stent in the affected area. This may be done to help keep blocked arteries open. Follow these instructions at home: Eating and drinking  Eat a heart-healthy diet. This includes fresh fruits and vegetables,  whole grains, and lean proteins like chicken, fish, eggs, and beans.  Avoid foods that contain a lot of: ? Salt (sodium). ? Sugar. ? Saturated fat (such as red meat). ? Trans fat (such as fried foods).  Stay hydrated. Drink enough fluid to keep your urine clear or pale yellow. Lifestyle  Stay active and get regular exercise as told by your health care provider. Aim for 150 minutes of moderate activity or 75 minutes of vigorous activity a week. Ask your health care provider what activities and forms of exercise are safe for you.  Maintain a healthy weight.  Work with your health care provider to manage your cholesterol.  Manage any other health problems you have, such as high blood pressure, diabetes, or heart rhythm problems.  Do not use any products that contain nicotine or tobacco, such as cigarettes and e-cigarettes. If you need help quitting, ask your health care provider. General instructions  Take over-the-counter and prescription medicines only as told by your health care provider.  Keep all follow-up visits as told by your health care provider. This is important. Contact a health care provider if:  Your symptoms do not improve or they return after treatment.  You have a fever. Get help right away if:  You have severe abdominal pain.  You have severe chest pain.  You have shortness of breath.  You feel weak or dizzy.  You have palpitations.  You have numbness or weakness in your face, arm, or leg.  You are confused.  You  have trouble speaking or people have trouble understanding what you are saying.  You are constipated.  You have trouble urinating.  You have blood in your stool.  You have severe nausea, vomiting, or persistent diarrhea. Summary  Mesenteric ischemia is poor circulation in the vessels that supply blood to the the stomach, intestines, and liver (mesenteric organs).  This condition happens when an artery or vein that provides blood to  the mesenteric organs gradually becomes blocked or narrow, restricting the blood supply to the organs.  This condition is commonly caused by fatty deposits that build up in an artery (plaque), which can narrow the artery and restrict blood flow.  You are more likely to develop this condition if you are over age 51 and have a history of heart problems, high blood pressure, diabetes, or high cholesterol.  This condition is usually treated with medicines, dietary and lifestyle changes, and surgery to remove the blockage, repair arteries or veins, and restore blood flow. This information is not intended to replace advice given to you by your health care provider. Make sure you discuss any questions you have with your health care provider. Document Released: 06/04/2011 Document Revised: 09/29/2016 Document Reviewed: 09/29/2016 Elsevier Interactive Patient Education  [employer].   Return in about 6 months (around 07/03/2019).   Kris Hartmann, NP  This note was completed with Sales executive.  Any errors are purely unintentional.

## 2018-12-31 NOTE — Patient Instructions (Signed)
Chronic Mesenteric Ischemia  Mesenteric ischemia is poor blood flow (circulation) in the vessels that supply blood to the stomach, intestines, and liver (mesenteric organs). Chronic mesenteric ischemia, also called mesenteric angina or intestinal angina, is a long-term (chronic) condition. It happens when an artery or vein that provides blood to the mesenteric organs gradually becomes blocked or narrow, restricting the blood supply to the organs. When the blood supply is severely restricted, the mesenteric organs cannot work properly. What are the causes? This condition is commonly caused by fatty deposits that build up in an artery (plaque), which can narrow the artery and restrict blood flow. Other causes include:  Weakened areas in blood vessel walls (aneurysms).  Conditions that cause twisting or inflammation of blood vessels, such as fibromuscular dysplasia or arteritis.  A disorder in which blood clots form in the veins (venous thrombosis).  Scarring and thickening (fibrosis) of blood vessels caused by radiation therapy.  A tear in the aorta, the body's main artery (aortic dissection).  Blood vessel problems after illegal drug use, such as use of cocaine.  Tumors in the nervous system (neurofibromatosis).  Certain autoimmune diseases, such as lupus. What increases the risk? The following factors may make you more likely to develop this condition:  Being male.  Being over age 50, especially if you have a history of heart problems.  Smoking.  Congestive heart failure.  Irregular heartbeat (arrhythmia).  Having a history of heart attack or stroke.  Diabetes.  High cholesterol.  High blood pressure (hypertension).  Being overweight or obese.  Kidney disease (renal disease) requiring dialysis. What are the signs or symptoms? Symptoms of this condition include:  Abdomen (abdominal) pain or cramps that develop 15-60 minutes after a meal. This pain may last for 1-3  hours. Some people may develop a fear of eating because of this symptom.  Weight loss.  Diarrhea.  Bloody stool.  Nausea.  Vomiting.  Bloating.  Abdominal pain after stress or with exercise. How is this diagnosed? This condition is diagnosed based on:  Your medical history.  A physical exam.  Tests, such as: ? Ultrasound. ? CT scan. ? Blood tests. ? Urine tests. ? An imaging test that involves injecting a dye into your arteries to show blood flow through blood vessels (angiogram). This can help to show if there are any blockages in the vessels that lead to the intestines. ? Passing a small probe through the mouth and into the stomach to measure the output of carbon dioxide (gastric tonometry). This can help to indicate whether there is decreased blood flow to the stomach and intestines. How is this treated? This condition may be treated with:  Dietary changes such as eating smaller, low-fat, meals more frequently.  Lifestyle changes to treat underlying conditions that contribute to the disease, such as high cholesterol and high blood pressure.  Medicines to reduce blood clotting and increase blood flow.  Surgery to remove the blockage, repair arteries or veins, and restore blood flow. This may involve: ? Angioplasty. This is surgery to widen the affected artery, reduce the blockage, and sometimes insert a small, mesh tube (stent). ? Bypass surgery. This may be done to go around (bypass) the blockage and reconnect healthy arteries or veins. ? Placing a stent in the affected area. This may be done to help keep blocked arteries open. Follow these instructions at home: Eating and drinking  Eat a heart-healthy diet. This includes fresh fruits and vegetables, whole grains, and lean proteins like chicken, fish, eggs,   and beans.  Avoid foods that contain a lot of: ? Salt (sodium). ? Sugar. ? Saturated fat (such as red meat). ? Trans fat (such as fried foods).  Stay  hydrated. Drink enough fluid to keep your urine clear or pale yellow. Lifestyle  Stay active and get regular exercise as told by your health care provider. Aim for 150 minutes of moderate activity or 75 minutes of vigorous activity a week. Ask your health care provider what activities and forms of exercise are safe for you.  Maintain a healthy weight.  Work with your health care provider to manage your cholesterol.  Manage any other health problems you have, such as high blood pressure, diabetes, or heart rhythm problems.  Do not use any products that contain nicotine or tobacco, such as cigarettes and e-cigarettes. If you need help quitting, ask your health care provider. General instructions  Take over-the-counter and prescription medicines only as told by your health care provider.  Keep all follow-up visits as told by your health care provider. This is important. Contact a health care provider if:  Your symptoms do not improve or they return after treatment.  You have a fever. Get help right away if:  You have severe abdominal pain.  You have severe chest pain.  You have shortness of breath.  You feel weak or dizzy.  You have palpitations.  You have numbness or weakness in your face, arm, or leg.  You are confused.  You have trouble speaking or people have trouble understanding what you are saying.  You are constipated.  You have trouble urinating.  You have blood in your stool.  You have severe nausea, vomiting, or persistent diarrhea. Summary  Mesenteric ischemia is poor circulation in the vessels that supply blood to the the stomach, intestines, and liver (mesenteric organs).  This condition happens when an artery or vein that provides blood to the mesenteric organs gradually becomes blocked or narrow, restricting the blood supply to the organs.  This condition is commonly caused by fatty deposits that build up in an artery (plaque), which can narrow the  artery and restrict blood flow.  You are more likely to develop this condition if you are over age 50 and have a history of heart problems, high blood pressure, diabetes, or high cholesterol.  This condition is usually treated with medicines, dietary and lifestyle changes, and surgery to remove the blockage, repair arteries or veins, and restore blood flow. This information is not intended to replace advice given to you by your health care provider. Make sure you discuss any questions you have with your health care provider. Document Released: 06/04/2011 Document Revised: 09/29/2016 Document Reviewed: 09/29/2016 Elsevier Interactive Patient Education  2019 Elsevier Inc.  

## 2019-01-07 ENCOUNTER — Other Ambulatory Visit (INDEPENDENT_AMBULATORY_CARE_PROVIDER_SITE_OTHER): Payer: Self-pay | Admitting: Vascular Surgery

## 2019-01-21 ENCOUNTER — Other Ambulatory Visit: Payer: Self-pay | Admitting: Family Medicine

## 2019-01-23 ENCOUNTER — Inpatient Hospital Stay: Payer: Medicare Other | Admitting: Oncology

## 2019-01-23 ENCOUNTER — Inpatient Hospital Stay: Payer: Medicare Other

## 2019-01-27 ENCOUNTER — Encounter: Payer: Medicare Other | Admitting: Family Medicine

## 2019-01-27 ENCOUNTER — Ambulatory Visit: Payer: Medicare Other

## 2019-01-29 ENCOUNTER — Ambulatory Visit (INDEPENDENT_AMBULATORY_CARE_PROVIDER_SITE_OTHER): Payer: Medicare Other

## 2019-01-29 ENCOUNTER — Ambulatory Visit: Payer: Medicare Other

## 2019-01-29 VITALS — BP 148/77 | Ht 66.0 in | Wt 142.0 lb

## 2019-01-29 DIAGNOSIS — Z0000 Encounter for general adult medical examination without abnormal findings: Secondary | ICD-10-CM

## 2019-01-29 NOTE — Progress Notes (Addendum)
Subjective:   Samuel Bryan. is a 85 y.o. male who presents for Medicare Annual/Subsequent preventive examination.  This visit is being conducted via virtual visit due to the COVID-19 pandemic. This patient has given me verbal consent via video chat to conduct this visit. Some vital signs may be absent or patient reported.    Review of Systems:   Cardiac Risk Factors include: advanced age (>7men, >75 women);diabetes mellitus;dyslipidemia;hypertension;male gender     Objective:    Vitals: BP (!) 148/77   Ht 5\' 6"  (1.676 m)   Wt 142 lb (64.4 kg)   BMI 22.92 kg/m   Body mass index is 22.92 kg/m.  Advanced Directives 01/29/2019 11/24/2018 11/10/2018 08/19/2018 07/25/2018 12/18/2017 12/10/2017  Does Patient Have a Medical Advance Directive? Yes Yes Yes No Yes Yes Yes  Type of Advance Directive Living will;Healthcare Power of Attorney Living will [place];Living will [place];Living will [place];Living will  Does patient want to make changes to medical advance directive? - No - Patient declined No - Patient declined - - No - Patient declined No - Patient declined  Copy of [place] in Chart? No - copy requested No - copy requested No - copy requested - - No - copy requested No - copy requested  Would patient like information on creating a medical advance directive? - - - No - Patient declined No - Patient declined - -    Tobacco Social History   Tobacco Use  Smoking Status Former Smoker  . Types: Cigarettes  . Last attempt to quit: 10/29/1970  . Years since quitting: 48.2  Smokeless Tobacco Never Used     Counseling given: Not Answered   Clinical Intake:  Pre-visit preparation completed: Yes  Pain : No/denies pain     BMI - recorded: 22.92 Nutritional Status: BMI of 19-24  Normal Nutritional Risks: None Diabetes: Yes CBG done?: No CBG resulted in Enter/ Edit  results?: No Did pt. bring in CBG monitor from home?: Yes Glucose Meter Downloaded?: Comment(virtual visit)  How often do you need to have someone help you when you read instructions, pamphlets, or other written materials from your doctor or pharmacy?: 1 - Never  Interpreter Needed?: No  Information entered by :: Clemetine Marker LPN  Past Medical History:  Diagnosis Date  . Anemia   . Atherosclerosis of renal artery ([place])   . Essential hypertension, benign   . Occlusion and stenosis of carotid artery without mention of cerebral infarction    moderate left ICA stenosis  . Peripheral vascular disease, unspecified ([place])    mild lifestyle limiting claudication  . Pure hypercholesterolemia   . Renal insufficiency   . Type 2 diabetes mellitus ([place])    Past Surgical History:  Procedure Laterality Date  . APPENDECTOMY  1970's  . COLONOSCOPY    . coronary artery stenting  1999   at [facility]   . ENDARTERECTOMY Left 12/18/2017   Procedure: ENDARTERECTOMY CAROTID WITH PATCH;  Surgeon: Delana Meyer Dolores Lory, MD;  Location: [facility] ORS;  Service: Vascular;  Laterality: Left;  . FACIAL LACERATION REPAIR Left 12/13/2015   performed at [facility] by Dr. Marcial Pacas  . renal angiography    . VISCERAL ANGIOGRAPHY N/A 08/19/2018   Procedure: VISCERAL ANGIOGRAPHY;  Surgeon: Katha Cabal, MD;  Location: [facility];  Service: Cardiovascular;  Laterality: N/A;   Family History  Problem Relation Age of Onset  . Diabetes  Mother   . Heart disease Mother   . Hypertension Brother   . Hyperlipidemia Brother   . Diabetes Brother   . Hypertension Son   . Hypertension Son   . Birth defects Neg Hx   . Stroke Neg Hx    Social History   Socioeconomic History  . Marital status: Widowed    Spouse name: Not on file  . Number of children: Not on file  . Years of education: Not on file  . Highest education level: Not on file  Occupational History  . Occupation: retired  Scientific laboratory technician  . Financial  resource strain: Not hard at all  . Food insecurity:    Worry: Never true    Inability: Never true  . Transportation needs:    Medical: No    Non-medical: No  Tobacco Use  . Smoking status: Former Smoker    Types: Cigarettes    Last attempt to quit: 10/29/1970    Years since quitting: 48.2  . Smokeless tobacco: Never Used  Substance and Sexual Activity  . Alcohol use: No    Alcohol/week: 0.0 standard drinks  . Drug use: No  . Sexual activity: Not Currently  Lifestyle  . Physical activity:    Days per week: 0 days    Minutes per session: 0 min  . Stress: Not at all  Relationships  . Social connections:    Talks on phone: More than three times a week    Gets together: Three times a week    Attends religious service: More than 4 times per year    Active member of club or organization: No    Attends meetings of clubs or organizations: Never    Relationship status: Widowed  Other Topics Concern  . Not on file  Social History Narrative   Married, all children are healthy.    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     Outpatient Encounter Medications as of 01/29/2019  Medication Sig  . amLODipine (NORVASC) 2.5 MG tablet Take 1 tablet (2.5 mg total) by mouth daily.  Marland Kitchen aspirin EC 81 MG tablet Take 81 mg by mouth daily.   Marland Kitchen atorvastatin (LIPITOR) 40 MG tablet Take 1 tablet (40 mg total) by mouth at bedtime.  . cholecalciferol (VITAMIN D) 1000 units tablet Take 1,000 Units by mouth every other day.   . clopidogrel (PLAVIX) 75 MG tablet TAKE 1 TABLET DAILY.  . furosemide (LASIX) 40 MG tablet Take 40 mg by mouth daily.  . isosorbide mononitrate (IMDUR) 30 MG 24 hr tablet Take 1 tablet (30 mg total) by mouth daily.  Marland Kitchen latanoprost (XALATAN) 0.005 % ophthalmic solution Place 1 drop into both eyes at bedtime.  Marland Kitchen linagliptin (TRADJENTA) 5 MG TABS tablet Take 1 tablet (5 mg total) by mouth daily. For diabetes  . losartan (COZAAR) 100 MG  tablet Take 100 mg by mouth daily.  . nitroGLYCERIN (NITROSTAT) 0.4 MG SL tablet Place 1 tablet (0.4 mg total) under the tongue every 5 (five) minutes as needed for chest pain. Max of 3 pills; call 911  . Omega-3 Fatty Acids (FISH OIL) 1000 MG CAPS Take 1,000 mg by mouth daily.   . timolol (BETIMOL) 0.5 % ophthalmic solution Place 1 drop into both eyes 2 (two) times daily. (Patient taking differently: Place 1 drop into both eyes at bedtime. )  . vitamin B-12 (CYANOCOBALAMIN) 1000 MCG tablet Take 1,000 mcg by mouth daily.   No  facility-administered encounter medications on file as of 01/29/2019.     Activities of Daily Living In your present state of health, do you have any difficulty performing the following activities: 01/29/2019 08/21/2018  Hearing? Y Y  Comment hearing aids -  Vision? N N  Difficulty concentrating or making decisions? N N  Walking or climbing stairs? N N  Dressing or bathing? N N  Doing errands, shopping? N N  Preparing Food and eating ? N -  Using the Toilet? N -  In the past six months, have you accidently leaked urine? N -  Do you have problems with loss of bowel control? N -  Managing your Medications? N -  Managing your Finances? N -  Housekeeping or managing your Housekeeping? N -  Some recent data might be hidden    Patient Care Team: Lada, Satira Anis, MD as PCP - General (Family Medicine) Lavonia Dana, MD as Consulting Physician (Internal Medicine) Yolonda Kida, MD as Consulting Physician (Cardiology) Anell Barr, OD (Optometry) Sanda Klein Satira Anis, MD as Attending Physician (Family Medicine) Bary Castilla Forest Gleason, MD (General Surgery) Schnier, Dolores Lory, MD (Vascular Surgery)   Assessment:   This is a routine wellness examination for Miles.  Exercise Activities and Dietary recommendations Current Exercise Habits: The patient does not participate in regular exercise at present, Exercise limited by: cardiac condition(s);orthopedic condition(s)   Goals    . LIFESTYLE - DECREASE FALLS RISK       Fall Risk Fall Risk  01/29/2019 08/21/2018 07/28/2018 07/25/2018 01/23/2018  Falls in the past year? 0 No No No No  Number falls in past yr: 0 - - - -  Injury with Fall? 0 - - - -  Follow up Falls prevention discussed - - - -   FALL RISK PREVENTION PERTAINING TO THE HOME:  Any stairs in or around the home? No  If so, do they handrails? No   Home free of loose throw rugs in walkways, pet beds, electrical cords, etc? Yes  Adequate lighting in your home to reduce risk of falls? Yes   ASSISTIVE DEVICES UTILIZED TO PREVENT FALLS:  Life alert? No  Use of a cane, walker or w/c? No  Grab bars in the bathroom? No Shower chair or bench in shower? No Elevated toilet seat or a handicapped toilet? No   DME ORDERS:  DME order needed?  No   TIMED UP AND GO:  Was the test performed? No .  Length of time to ambulate 10 feet:  sec.   GAIT:  Appearance of gait: Gait stead-fast and without the use of an assistive device.  Education: Fall risk prevention has been discussed.  Intervention(s) required? No    Depression Screen PHQ 2/9 Scores 01/29/2019 08/21/2018 07/25/2018 01/23/2018  PHQ - 2 Score 0 0 0 0    Cognitive Function 6CIT not completed today as patient had a hard time hearing me over FaceTime call.      6CIT Screen 01/23/2018  What Year? 4 points  What month? 0 points  What time? 0 points  Count back from 20 4 points  Months in reverse 4 points  Repeat phrase 10 points  Total Score 22    Immunization History  Administered Date(s) Administered  . Influenza, High Dose Seasonal PF 09/13/2015, 08/08/2016, 08/09/2017  . Influenza,inj,Quad PF,6+ Mos 07/08/2014  . Influenza-Unspecified 08/04/2007, 08/10/2008, 08/06/2012, 06/19/2013  . Pneumococcal Conjugate-13 04/20/2014  . Pneumococcal Polysaccharide-23 09/13/2015  . Tdap 06/27/2011  . Zoster 07/12/2008  Qualifies for Shingles Vaccine? Yes  Zostavax completed 2009. Due  for Shingrix. Education has been provided regarding the importance of this vaccine. Pt has been advised to call insurance company to determine out of pocket expense. Advised may also receive vaccine at local pharmacy or Health Dept. Verbalized acceptance and understanding.  Tdap: Up to date  Flu Vaccine: Due for Flu vaccine. Does the patient want to receive this vaccine today?  No . Education has been provided regarding the importance of this vaccine but still declined. Advised may receive this vaccine at local pharmacy or Health Dept. Aware to provide a copy of the vaccination record if obtained from local pharmacy or Health Dept. Verbalized acceptance and understanding.  Pneumococcal Vaccine: Up to date   Screening Tests Health Maintenance  Topic Date Due  . HEMOGLOBIN A1C  02/20/2019  . OPHTHALMOLOGY EXAM  07/29/2019  . FOOT EXAM  08/22/2019  . TETANUS/TDAP  06/26/2021  . PNA vac Low Risk Adult  Completed  . INFLUENZA VACCINE  Discontinued   Cancer Screenings:  Colorectal Screening:  No longer required.   Lung Cancer Screening: (Low Dose CT Chest recommended if Age 40-80 years, 30 pack-year currently smoking OR have quit w/in 15years.) does not qualify.   Additional Screening:  Hepatitis C Screening: no longer required  Vision Screening: Recommended annual ophthalmology exams for early detection of glaucoma and other disorders of the eye. Is the patient up to date with their annual eye exam?  Yes  Who is the provider or what is the name of the office in which the pt attends annual eye exams? Dr. Ellin Mayhew  Dental Screening: Recommended annual dental exams for proper oral hygiene  Community Resource Referral:  CRR required this visit?  No       Plan:    I have personally reviewed and addressed the Medicare Annual Wellness questionnaire and have noted the following in the patient's chart:  A. Medical and social history B. Use of alcohol, tobacco or illicit drugs  C.  Current medications and supplements D. Functional ability and status E.  Nutritional status F.  Physical activity G. Advance directives H. List of other physicians I.  Hospitalizations, surgeries, and ER visits in previous 12 months J.  [place] such as hearing and vision if needed, cognitive and depression L. Referrals and appointments   In addition, I have reviewed and discussed with patient certain preventive protocols, quality metrics, and best practice recommendations. A written personalized care plan for preventive services as well as general preventive health recommendations were provided to patient.   Signed,  Clemetine Marker, LPN Nurse Health Advisor   Nurse Notes: pt doing well and appreciative of visit today conducted via FaceTime. His son and daughter were also present. Bp slightly elevated with home blood pressure monitor, however, patient stated he had not taken his medicine yet today. Advised to take medication and check again later today to make sure it is within normal range and if not to contact our office.

## 2019-01-29 NOTE — Patient Instructions (Signed)
Mr. Samuel Bryan , Thank you for taking time to come for your Medicare Wellness Visit. I appreciate your ongoing commitment to your health goals. Please review the following plan we discussed and let me know if I can assist you in the future.   Screening recommendations/referrals: Colonoscopy: no longer required Recommended yearly ophthalmology/optometry visit for glaucoma screening and checkup Recommended yearly dental visit for hygiene and checkup  Vaccinations: Influenza vaccine: postponed Pneumococcal vaccine: done 08/13/15 Tdap vaccine: done 06/27/11 Shingles vaccine: Shingrix discussed. Please contact your pharmacy for coverage information.   Advanced directives: Please bring a copy of your health care power of attorney and living will to the office at your convenience.  Conditions/risks identified: Recommend drinking 6-8 glasses of water per day.   Next appointment: Please follow up in one year for your Medicare Annual Wellness visit.   Preventive Care 83 Years and Older, Male Preventive care refers to lifestyle choices and visits with your health care provider that can promote health and wellness. What does preventive care include?  A yearly physical exam. This is also called an annual well check.  Dental exams once or twice a year.  Routine eye exams. Ask your health care provider how often you should have your eyes checked.  Personal lifestyle choices, including:  Daily care of your teeth and gums.  Regular physical activity.  Eating a healthy diet.  Avoiding tobacco and drug use.  Limiting alcohol use.  Practicing safe sex.  Taking low doses of aspirin every day.  Taking vitamin and mineral supplements as recommended by your health care provider. What happens during an annual well check? The services and screenings done by your health care provider during your annual well check will depend on your age, overall health, lifestyle risk factors, and family history of  disease. Counseling  Your health care provider may ask you questions about your:  Alcohol use.  Tobacco use.  Drug use.  Emotional well-being.  Home and relationship well-being.  Sexual activity.  Eating habits.  History of falls.  Memory and ability to understand (cognition).  Work and work Statistician. Screening  You may have the following tests or measurements:  Height, weight, and BMI.  Blood pressure.  Lipid and cholesterol levels. These may be checked every 5 years, or more frequently if you are over 67 years old.  Skin check.  Lung cancer screening. You may have this screening every year starting at age 83 if you have a 30-pack-year history of smoking and currently smoke or have quit within the past 15 years.  Fecal occult blood test (FOBT) of the stool. You may have this test every year starting at age 5.  Flexible sigmoidoscopy or colonoscopy. You may have a sigmoidoscopy every 5 years or a colonoscopy every 10 years starting at age 83.  Prostate cancer screening. Recommendations will vary depending on your family history and other risks.  Hepatitis C blood test.  Hepatitis B blood test.  Sexually transmitted disease (STD) testing.  Diabetes screening. This is done by checking your blood sugar (glucose) after you have not eaten for a while (fasting). You may have this done every 1-3 years.  Abdominal aortic aneurysm (AAA) screening. You may need this if you are a current or former smoker.  Osteoporosis. You may be screened starting at age 6 if you are at high risk. Talk with your health care provider about your test results, treatment options, and if necessary, the need for more tests. Vaccines  Your health care provider  may recommend certain vaccines, such as:  Influenza vaccine. This is recommended every year.  Tetanus, diphtheria, and acellular pertussis (Tdap, Td) vaccine. You may need a Td booster every 10 years.  Zoster vaccine. You may  need this after age 39.  Pneumococcal 13-valent conjugate (PCV13) vaccine. One dose is recommended after age 56.  Pneumococcal polysaccharide (PPSV23) vaccine. One dose is recommended after age 45. Talk to your health care provider about which screenings and vaccines you need and how often you need them. This information is not intended to replace advice given to you by your health care provider. Make sure you discuss any questions you have with your health care provider. Document Released: 11/11/2015 Document Revised: 07/04/2016 Document Reviewed: 08/16/2015 Elsevier Interactive Patient Education  2017 [place] Prevention in the Home Falls can cause injuries. They can happen to people of all ages. There are many things you can do to make your home safe and to help prevent falls. What can I do on the outside of my home?  Regularly fix the edges of walkways and driveways and fix any cracks.  Remove anything that might make you trip as you walk through a door, such as a raised step or threshold.  Trim any bushes or trees on the path to your home.  Use bright outdoor lighting.  Clear any walking paths of anything that might make someone trip, such as rocks or tools.  Regularly check to see if handrails are loose or broken. Make sure that both sides of any steps have handrails.  Any raised decks and porches should have guardrails on the edges.  Have any leaves, snow, or ice cleared regularly.  Use sand or salt on walking paths during winter.  Clean up any spills in your garage right away. This includes oil or grease spills. What can I do in the bathroom?  Use night lights.  Install grab bars by the toilet and in the tub and shower. Do not use towel bars as grab bars.  Use non-skid mats or decals in the tub or shower.  If you need to sit down in the shower, use a plastic, non-slip stool.  Keep the floor dry. Clean up any water that spills on the floor as soon as it  happens.  Remove soap buildup in the tub or shower regularly.  Attach bath mats securely with double-sided non-slip rug tape.  Do not have throw rugs and other things on the floor that can make you trip. What can I do in the bedroom?  Use night lights.  Make sure that you have a light by your bed that is easy to reach.  Do not use any sheets or blankets that are too big for your bed. They should not hang down onto the floor.  Have a firm chair that has side arms. You can use this for support while you get dressed.  Do not have throw rugs and other things on the floor that can make you trip. What can I do in the kitchen?  Clean up any spills right away.  Avoid walking on wet floors.  Keep items that you use a lot in easy-to-reach places.  If you need to reach something above you, use a strong step stool that has a grab bar.  Keep electrical cords out of the way.  Do not use floor polish or wax that makes floors slippery. If you must use wax, use non-skid floor wax.  Do not have throw rugs  and other things on the floor that can make you trip. What can I do with my stairs?  Do not leave any items on the stairs.  Make sure that there are handrails on both sides of the stairs and use them. Fix handrails that are broken or loose. Make sure that handrails are as long as the stairways.  Check any carpeting to make sure that it is firmly attached to the stairs. Fix any carpet that is loose or worn.  Avoid having throw rugs at the top or bottom of the stairs. If you do have throw rugs, attach them to the floor with carpet tape.  Make sure that you have a light switch at the top of the stairs and the bottom of the stairs. If you do not have them, ask someone to add them for you. What else can I do to help prevent falls?  Wear shoes that:  Do not have high heels.  Have rubber bottoms.  Are comfortable and fit you well.  Are closed at the toe. Do not wear sandals.  If you  use a stepladder:  Make sure that it is fully opened. Do not climb a closed stepladder.  Make sure that both sides of the stepladder are locked into place.  Ask someone to hold it for you, if possible.  Clearly mark and make sure that you can see:  Any grab bars or handrails.  First and last steps.  Where the edge of each step is.  Use tools that help you move around (mobility aids) if they are needed. These include:  Canes.  Walkers.  Scooters.  Crutches.  Turn on the lights when you go into a dark area. Replace any light bulbs as soon as they burn out.  Set up your furniture so you have a clear path. Avoid moving your furniture around.  If any of your floors are uneven, fix them.  If there are any pets around you, be aware of where they are.  Review your medicines with your doctor. Some medicines can make you feel dizzy. This can increase your chance of falling. Ask your doctor what other things that you can do to help prevent falls. This information is not intended to replace advice given to you by your health care provider. Make sure you discuss any questions you have with your health care provider. Document Released: 08/11/2009 Document Revised: 03/22/2016 Document Reviewed: 11/19/2014 Elsevier Interactive Patient Education  2017 [employer].

## 2019-02-04 ENCOUNTER — Telehealth: Payer: Self-pay

## 2019-02-04 DIAGNOSIS — E1122 Type 2 diabetes mellitus with diabetic chronic kidney disease: Secondary | ICD-10-CM

## 2019-02-04 DIAGNOSIS — L600 Ingrowing nail: Secondary | ICD-10-CM

## 2019-02-04 DIAGNOSIS — N183 Chronic kidney disease, stage 3 (moderate): Secondary | ICD-10-CM

## 2019-02-04 NOTE — Telephone Encounter (Signed)
Copied from [place] [id number]. Topic: General - Other >> Feb 04, 2019  9:35 AM Antonieta Iba C wrote: Reason for CRM: pt's daughter called in to request a referral. Pt is diabetic and has a engrown toe nail.   CB: [phone]

## 2019-02-04 NOTE — Telephone Encounter (Signed)
I entered referral Note does not say how urgent, which foot, or if infected Please work with referral coordinator to make sure he is seen in a timely fashion based on how severe his ingrown toenail is Thank you

## 2019-02-04 NOTE — Telephone Encounter (Signed)
Notified, they state is not infected

## 2019-02-12 ENCOUNTER — Ambulatory Visit: Payer: Medicare Other | Admitting: Podiatry

## 2019-02-16 ENCOUNTER — Ambulatory Visit: Payer: Medicare Other | Admitting: Family Medicine

## 2019-03-06 ENCOUNTER — Ambulatory Visit (INDEPENDENT_AMBULATORY_CARE_PROVIDER_SITE_OTHER): Payer: Medicare Other | Admitting: Nurse Practitioner

## 2019-03-06 ENCOUNTER — Other Ambulatory Visit: Payer: Self-pay

## 2019-03-06 ENCOUNTER — Encounter: Payer: Self-pay | Admitting: Nurse Practitioner

## 2019-03-06 VITALS — BP 141/58

## 2019-03-06 DIAGNOSIS — S61411A Laceration without foreign body of right hand, initial encounter: Secondary | ICD-10-CM | POA: Diagnosis not present

## 2019-03-06 DIAGNOSIS — R5383 Other fatigue: Secondary | ICD-10-CM

## 2019-03-06 DIAGNOSIS — D509 Iron deficiency anemia, unspecified: Secondary | ICD-10-CM

## 2019-03-06 DIAGNOSIS — R6889 Other general symptoms and signs: Secondary | ICD-10-CM

## 2019-03-06 DIAGNOSIS — R2241 Localized swelling, mass and lump, right lower limb: Secondary | ICD-10-CM | POA: Diagnosis not present

## 2019-03-06 NOTE — Patient Instructions (Signed)
High iron Foods include: Animal- Chicken liver,Oysters, Clams, Beef liver, Beef (chuck roast, lean ground beef), [place] leg, Tuna Eggs Shrimp Leg of lamb Plant- Raisin bran (enriched), Instant oatmeal,Beans (kidney, lima, [place]),Tofu, Lentils, Molasses, Spinach, Whole wheat bread, Peanut butter, Brown rice

## 2019-03-06 NOTE — Progress Notes (Signed)
Virtual Visit via Video Note  I connected with Rod Can on 03/06/19 at 10:20 AM EDT by a video enabled telemedicine application and verified that I am speaking with the correct person using two identifiers.   Staff discussed the limitations of evaluation and management by telemedicine and the availability of in person appointments. The patient expressed understanding and agreed to proceed.  Patient location: home  My location: home office Other people present: Duriel Deery (daughter)   HPI  On Saturday he cut his hand on the side of the tractor, on a metal piece. Last TDAP was 2012; right hand laceration is closed- no pain, drainage or redness. States it was a little bit itchy yesterday  Right foot swelling started a few weeks ago was instructed by nephrologist recently to take double lasix for 4 days. Has completed course and swelling has resolved. Denies injury, pain or redness.  Family and patient also note that he has had fatigue and stays cold over the past few months. He is taking frequent naps. Taking B12 supplements, no iron supplements. Denies blood in stools. No shortness of breath, chest pain.   PHQ2/9: Depression screen [facility] 2/9 03/06/2019 01/29/2019 08/21/2018 07/25/2018 01/23/2018  Decreased Interest 0 0 0 0 0  Down, Depressed, Hopeless 0 0 0 0 0  PHQ - 2 Score 0 0 0 0 0  Altered sleeping 0 - - - -  Tired, decreased energy 0 - - - -  Change in appetite 0 - - - -  Feeling bad or failure about yourself  0 - - - -  Trouble concentrating 0 - - - -  Moving slowly or fidgety/restless 0 - - - -  Suicidal thoughts 0 - - - -  PHQ-9 Score 0 - - - -  Difficult doing work/chores Not difficult at all - - - -    PHQ reviewed. Negative  Patient Active Problem List   Diagnosis Date Noted  . Iron deficiency anemia 11/24/2018  . Aortic atherosclerosis ([place]) 07/28/2018  . Cholelithiasis 07/28/2018  . Pancreatic lesion 07/28/2018  . Chronic mesenteric ischemia ([place]) 07/28/2018  .  Full code status 01/23/2018  . Memory loss 01/23/2018  . Carotid stenosis, asymptomatic, left 12/18/2017  . Degenerative arthritis of left shoulder region 08/19/2017  . Microalbuminuria 08/19/2017  . On angiotensin receptor blockers (ARB) 08/19/2017  . Type 2 diabetes mellitus ([place]) 08/13/2017  . Preventative health care 12/11/2016  . Abdominal mass 04/11/2016  . Abdominal mass, right upper quadrant 04/03/2016  . Bilateral carotid bruits 04/03/2016  . Medication monitoring encounter 04/03/2016  . Facial injury 10/26/2015  . 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 Arteriosclerosis of coronary artery 10/11/2015  . H/O angina pectoris 10/11/2015  . Hyperlipidemia 09/13/2015  . Chronic kidney disease, stage III (moderate) ([place]) 09/13/2015  . RENAL ATHEROSCLEROSIS 03/16/2009  . Essential hypertension 03/15/2009  . Coronary atherosclerosis 03/15/2009  . Carotid arterial disease ([place]) 03/15/2009  . Peripheral vascular disease ([place]) 03/15/2009  . Anemia in chronic kidney disease (CKD) 03/15/2009    Past Medical History:  Diagnosis Date  . Anemia   . Atherosclerosis of renal artery ([place])   . Essential hypertension, benign   . Occlusion and stenosis of carotid artery without mention of cerebral infarction    moderate left ICA stenosis  . Peripheral vascular disease, unspecified ([place])    mild lifestyle limiting claudication  . Pure hypercholesterolemia   . Renal insufficiency   . Type 2 diabetes mellitus ([facility])     Past  Surgical History:  Procedure Laterality Date  . APPENDECTOMY  1970's  . COLONOSCOPY    . coronary artery stenting  1999   at [facility]   . ENDARTERECTOMY Left 12/18/2017   Procedure: ENDARTERECTOMY CAROTID WITH PATCH;  Surgeon: Delana Meyer Dolores Lory, MD;  Location: [facility] ORS;  Service: Vascular;  Laterality: Left;  . FACIAL LACERATION REPAIR Left 12/13/2015   performed at [facility] by Dr. Marcial Pacas  . renal angiography    . VISCERAL ANGIOGRAPHY N/A 08/19/2018   Procedure:  VISCERAL ANGIOGRAPHY;  Surgeon: Katha Cabal, MD;  Location: [place] [facility];  Service: Cardiovascular;  Laterality: N/A;    Social History   Tobacco Use  . Smoking status: Former Smoker    Types: Cigarettes    Last attempt to quit: 10/29/1970    Years since quitting: 48.3  . Smokeless tobacco: Never Used  Substance Use Topics  . Alcohol use: No    Alcohol/week: 0.0 standard drinks     Current Outpatient Medications:  .  amLODipine (NORVASC) 2.5 MG tablet, Take 1 tablet (2.5 mg total) by mouth daily., Disp: 90 tablet, Rfl: 3 .  aspirin EC 81 MG tablet, Take 81 mg by mouth daily. , Disp: , Rfl:  .  atorvastatin (LIPITOR) 40 MG tablet, Take 1 tablet (40 mg total) by mouth at bedtime., Disp: 90 tablet, Rfl: 0 .  clopidogrel (PLAVIX) 75 MG tablet, TAKE 1 TABLET DAILY., Disp: 90 tablet, Rfl: 0 .  furosemide (LASIX) 40 MG tablet, Take 40 mg by mouth daily., Disp: , Rfl:  .  isosorbide mononitrate (IMDUR) 30 MG 24 hr tablet, Take 1 tablet (30 mg total) by mouth daily., Disp: , Rfl:  .  latanoprost (XALATAN) 0.005 % ophthalmic solution, Place 1 drop into both eyes at bedtime., Disp: , Rfl:  .  linagliptin (TRADJENTA) 5 MG TABS tablet, Take 1 tablet (5 mg total) by mouth daily. For diabetes, Disp: 90 tablet, Rfl: 3 .  losartan (COZAAR) 100 MG tablet, Take 100 mg by mouth daily., Disp: , Rfl:  .  nitroGLYCERIN (NITROSTAT) 0.4 MG SL tablet, Place 1 tablet (0.4 mg total) under the tongue every 5 (five) minutes as needed for chest pain. Max of 3 pills; call 911, Disp: 25 tablet, Rfl: 3 .  Omega-3 Fatty Acids (FISH OIL) 1000 MG CAPS, Take 1,000 mg by mouth daily. , Disp: , Rfl:  .  timolol (BETIMOL) 0.5 % ophthalmic solution, Place 1 drop into both eyes 2 (two) times daily. (Patient taking differently: Place 1 drop into both eyes at bedtime. ), Disp: 10 mL, Rfl: 12 .  vitamin B-12 (CYANOCOBALAMIN) 1000 MCG tablet, Take 1,000 mcg by mouth daily., Disp: , Rfl:  .  cholecalciferol (VITAMIN D)  1000 units tablet, Take 1,000 Units by mouth every other day. , Disp: , Rfl:   No Known Allergies  ROS   No other specific complaints in a complete review of systems (except as listed in HPI above).  Objective  Vitals:   03/06/19 0953  BP: (!) 141/58    There is no height or weight on file to calculate BMI.  Nursing Note and Vital Signs reviewed.  Physical Exam  Constitutional: Patient appears well-developed and well-nourished. No distress.  HENT: Head: Normocephalic and atraumatic. Pulmonary/Chest: Effort normal  Musculoskeletal: Normal range of motion,  Neurological: alert and oriented, speech normal.  Skin: small closed laceration without redness or drainage. No erythema.  Psychiatric: Patient has a normal mood and affect. behavior is normal. Judgment and  thought content normal.    Assessment & Plan  1. Laceration of right hand without foreign body, initial encounter tdap up to date no signs of infection, plan to keep clean and dry use bacitracin   2. Localized swelling of right foot Resolved return to normal lasix amount as directed by nephrologist   3. Fatigue, unspecified type Discussed nutrition, rest, hydration  4. Cold feeling Consider anemia, due to pandemic do not recommend labs at this time will try to increase supplementation follow up in 2 weeks. Will reach out to his hematologist.   5. Iron deficiency anemia, unspecified iron deficiency anemia type Increase iron in diet ; last levels were normal    Follow Up Instructions: 2 weeks for fatigue   I discussed the assessment and treatment plan with the patient. The patient was provided an opportunity to ask questions and all were answered. The patient agreed with the plan and demonstrated an understanding of the instructions.   The patient was advised to call back or seek an in-person evaluation if the symptoms worsen or if the condition fails to improve as anticipated.  I provided 23 minutes of  non-face-to-face time during this encounter.   Fredderick Severance, NP

## 2019-03-09 ENCOUNTER — Other Ambulatory Visit: Payer: Self-pay | Admitting: Oncology

## 2019-03-09 DIAGNOSIS — D509 Iron deficiency anemia, unspecified: Secondary | ICD-10-CM

## 2019-03-09 DIAGNOSIS — D72819 Decreased white blood cell count, unspecified: Secondary | ICD-10-CM

## 2019-03-09 NOTE — Progress Notes (Signed)
cc

## 2019-03-10 ENCOUNTER — Other Ambulatory Visit: Payer: Self-pay

## 2019-03-10 ENCOUNTER — Inpatient Hospital Stay: Payer: Medicare Other | Attending: Oncology

## 2019-03-10 DIAGNOSIS — D509 Iron deficiency anemia, unspecified: Secondary | ICD-10-CM | POA: Insufficient documentation

## 2019-03-10 DIAGNOSIS — D72819 Decreased white blood cell count, unspecified: Secondary | ICD-10-CM | POA: Insufficient documentation

## 2019-03-10 DIAGNOSIS — E538 Deficiency of other specified B group vitamins: Secondary | ICD-10-CM | POA: Diagnosis not present

## 2019-03-10 LAB — CBC WITH DIFFERENTIAL/PLATELET
Abs Immature Granulocytes: 0.01 10*3/uL (ref 0.00–0.07)
Basophils Absolute: 0 10*3/uL (ref 0.0–0.1)
Basophils Relative: 0 %
Eosinophils Absolute: 0.1 10*3/uL (ref 0.0–0.5)
Eosinophils Relative: 1 %
HCT: 34 % — ABNORMAL LOW (ref 39.0–52.0)
Hemoglobin: 10.8 g/dL — ABNORMAL LOW (ref 13.0–17.0)
Immature Granulocytes: 0 %
Lymphocytes Relative: 19 %
Lymphs Abs: 0.7 10*3/uL (ref 0.7–4.0)
MCH: 32.4 pg (ref 26.0–34.0)
MCHC: 31.8 g/dL (ref 30.0–36.0)
MCV: 102.1 fL — ABNORMAL HIGH (ref 80.0–100.0)
Monocytes Absolute: 0.7 10*3/uL (ref 0.1–1.0)
Monocytes Relative: 19 %
Neutro Abs: 2.1 10*3/uL (ref 1.7–7.7)
Neutrophils Relative %: 61 %
Platelets: 173 10*3/uL (ref 150–400)
RBC: 3.33 MIL/uL — ABNORMAL LOW (ref 4.22–5.81)
RDW: 13.1 % (ref 11.5–15.5)
WBC: 3.5 10*3/uL — ABNORMAL LOW (ref 4.0–10.5)
nRBC: 0 % (ref 0.0–0.2)

## 2019-03-10 LAB — VITAMIN B12: Vitamin B-12: 913 pg/mL (ref 180–914)

## 2019-03-10 LAB — IRON AND TIBC
Iron: 59 ug/dL (ref 45–182)
Saturation Ratios: 25 % (ref 17.9–39.5)
TIBC: 236 ug/dL — ABNORMAL LOW (ref 250–450)
UIBC: 177 ug/dL

## 2019-03-10 LAB — FERRITIN: Ferritin: 281 ng/mL (ref 24–336)

## 2019-03-20 ENCOUNTER — Ambulatory Visit: Payer: Medicare Other | Admitting: Nurse Practitioner

## 2019-04-02 ENCOUNTER — Ambulatory Visit: Payer: Medicare Other

## 2019-04-08 ENCOUNTER — Ambulatory Visit (INDEPENDENT_AMBULATORY_CARE_PROVIDER_SITE_OTHER): Payer: Medicare Other | Admitting: Podiatry

## 2019-04-08 ENCOUNTER — Encounter: Payer: Self-pay | Admitting: Podiatry

## 2019-04-08 ENCOUNTER — Other Ambulatory Visit: Payer: Self-pay

## 2019-04-08 ENCOUNTER — Ambulatory Visit: Payer: Medicare Other | Admitting: Podiatry

## 2019-04-08 VITALS — BP 172/68 | HR 53 | Temp 98.1°F | Resp 16

## 2019-04-08 DIAGNOSIS — D689 Coagulation defect, unspecified: Secondary | ICD-10-CM

## 2019-04-08 DIAGNOSIS — M79676 Pain in unspecified toe(s): Secondary | ICD-10-CM

## 2019-04-08 DIAGNOSIS — B351 Tinea unguium: Secondary | ICD-10-CM

## 2019-04-08 NOTE — Progress Notes (Signed)
Subjective:  Patient ID: Samuel Lope., male    DOB: April 04, 1927,  MRN: [id number] HPI Chief Complaint  Patient presents with  . Toe Pain    Hallux right - medial border, tender x few weeks, ingrown, no treatment  . Diabetes    Last a1c was 6.5  . New Patient (Initial Visit)    85 y.o. male presents with the above complaint.   ROS: He denies fever chills nausea vomiting muscle aches pains calf pain back pain chest pain shortness of breath.  Past Medical History:  Diagnosis Date  . Anemia   . Atherosclerosis of renal artery ([place])   . Essential hypertension, benign   . Occlusion and stenosis of carotid artery without mention of cerebral infarction    moderate left ICA stenosis  . Peripheral vascular disease, unspecified ([place])    mild lifestyle limiting claudication  . Pure hypercholesterolemia   . Renal insufficiency   . Type 2 diabetes mellitus ([place])    Past Surgical History:  Procedure Laterality Date  . APPENDECTOMY  1970's  . COLONOSCOPY    . coronary artery stenting  1999   at [facility]   . ENDARTERECTOMY Left 12/18/2017   Procedure: ENDARTERECTOMY CAROTID WITH PATCH;  Surgeon: Delana Meyer Dolores Lory, MD;  Location: [facility] ORS;  Service: Vascular;  Laterality: Left;  . FACIAL LACERATION REPAIR Left 12/13/2015   performed at [facility] by Dr. Marcial Pacas  . renal angiography    . VISCERAL ANGIOGRAPHY N/A 08/19/2018   Procedure: VISCERAL ANGIOGRAPHY;  Surgeon: Katha Cabal, MD;  Location: [place] [facility];  Service: Cardiovascular;  Laterality: N/A;    Current Outpatient Medications:  .  amLODipine (NORVASC) 2.5 MG tablet, Take 1 tablet (2.5 mg total) by mouth daily., Disp: 90 tablet, Rfl: 3 .  aspirin EC 81 MG tablet, Take 81 mg by mouth daily. , Disp: , Rfl:  .  atorvastatin (LIPITOR) 40 MG tablet, Take 1 tablet (40 mg total) by mouth at bedtime., Disp: 90 tablet, Rfl: 0 .  cholecalciferol (VITAMIN D) 1000 units tablet, Take 1,000 Units by mouth every other day. , Disp:  , Rfl:  .  clopidogrel (PLAVIX) 75 MG tablet, TAKE 1 TABLET DAILY., Disp: 90 tablet, Rfl: 0 .  furosemide (LASIX) 40 MG tablet, Take 40 mg by mouth daily., Disp: , Rfl:  .  isosorbide mononitrate (IMDUR) 30 MG 24 hr tablet, Take 1 tablet (30 mg total) by mouth daily., Disp: , Rfl:  .  latanoprost (XALATAN) 0.005 % ophthalmic solution, Place 1 drop into both eyes at bedtime., Disp: , Rfl:  .  linagliptin (TRADJENTA) 5 MG TABS tablet, Take 1 tablet (5 mg total) by mouth daily. For diabetes, Disp: 90 tablet, Rfl: 3 .  losartan (COZAAR) 100 MG tablet, Take 100 mg by mouth daily., Disp: , Rfl:  .  nitroGLYCERIN (NITROSTAT) 0.4 MG SL tablet, Place 1 tablet (0.4 mg total) under the tongue every 5 (five) minutes as needed for chest pain. Sowmya Partridge of 3 pills; call 911, Disp: 25 tablet, Rfl: 3 .  Omega-3 Fatty Acids (FISH OIL) 1000 MG CAPS, Take 1,000 mg by mouth daily. , Disp: , Rfl:  .  timolol (BETIMOL) 0.5 % ophthalmic solution, Place 1 drop into both eyes 2 (two) times daily. (Patient taking differently: Place 1 drop into both eyes at bedtime. ), Disp: 10 mL, Rfl: 12 .  vitamin B-12 (CYANOCOBALAMIN) 1000 MCG tablet, Take 1,000 mcg by mouth daily., Disp: , Rfl:   No Known Allergies Review  of Systems Objective:   Vitals:   04/08/19 1059  BP: (!) 172/68  Pulse: (!) 53  Resp: 16  Temp: 98.1 F (36.7 C)    General: Well developed, nourished, in no acute distress, alert and oriented x3   Dermatological: Skin is warm, dry and supple bilateral. Nails x 10 are well maintained; remaining integument appears unremarkable at this time. There are no open sores, no preulcerative lesions, no rash or signs of infection present.  Toenails are thick yellow dystrophic onychomycotic sharply incurvated tibial border hallux right was debrided demonstrates no erythema edema cellulitis drainage odor no purulence no malodor no iatrogenic lesions.  Vascular: Dorsalis Pedis artery and Posterior Tibial artery pedal pulses are  2/4 bilateral with immedate capillary fill time. Pedal hair growth present. No varicosities and no lower extremity edema present bilateral.   Neruologic: Grossly intact via light touch bilateral. Vibratory intact via tuning fork bilateral. Protective threshold with Semmes Wienstein monofilament intact to all pedal sites bilateral. Patellar and Achilles deep tendon reflexes 2+ bilateral. No Babinski or clonus noted bilateral.   Musculoskeletal: No gross boney pedal deformities bilateral. No pain, crepitus, or limitation noted with foot and ankle range of motion bilateral. Muscular strength 5/5 in all groups tested bilateral.  Gait: Unassisted, Nonantalgic.    Radiographs:  None taken  Assessment & Plan:   Assessment: Ingrown toenails with thick yellow dystrophic onychomycotic nails painful in nature.  Plan: Debridement of toenails 1 through 5 bilateral.  He will follow-up with Dr. Sharyon Cable for routine nail debridement.     Pancho Rushing T. [place], [place]

## 2019-04-13 ENCOUNTER — Encounter (INDEPENDENT_AMBULATORY_CARE_PROVIDER_SITE_OTHER): Payer: Medicare Other

## 2019-04-13 ENCOUNTER — Ambulatory Visit (INDEPENDENT_AMBULATORY_CARE_PROVIDER_SITE_OTHER): Payer: Medicare Other | Admitting: Vascular Surgery

## 2019-05-20 ENCOUNTER — Other Ambulatory Visit: Payer: Self-pay

## 2019-05-21 ENCOUNTER — Inpatient Hospital Stay: Payer: Medicare Other | Attending: Oncology

## 2019-05-21 ENCOUNTER — Other Ambulatory Visit: Payer: Self-pay | Admitting: *Deleted

## 2019-05-21 ENCOUNTER — Encounter: Payer: Self-pay | Admitting: Oncology

## 2019-05-21 ENCOUNTER — Inpatient Hospital Stay (HOSPITAL_BASED_OUTPATIENT_CLINIC_OR_DEPARTMENT_OTHER): Payer: Medicare Other | Admitting: Oncology

## 2019-05-21 ENCOUNTER — Other Ambulatory Visit: Payer: Self-pay

## 2019-05-21 VITALS — BP 130/50 | Temp 97.6°F | Resp 16 | Wt 134.6 lb

## 2019-05-21 DIAGNOSIS — D709 Neutropenia, unspecified: Secondary | ICD-10-CM

## 2019-05-21 DIAGNOSIS — I129 Hypertensive chronic kidney disease with stage 1 through stage 4 chronic kidney disease, or unspecified chronic kidney disease: Secondary | ICD-10-CM | POA: Diagnosis not present

## 2019-05-21 DIAGNOSIS — E1122 Type 2 diabetes mellitus with diabetic chronic kidney disease: Secondary | ICD-10-CM | POA: Diagnosis not present

## 2019-05-21 DIAGNOSIS — D631 Anemia in chronic kidney disease: Secondary | ICD-10-CM

## 2019-05-21 DIAGNOSIS — D509 Iron deficiency anemia, unspecified: Secondary | ICD-10-CM

## 2019-05-21 DIAGNOSIS — N183 Chronic kidney disease, stage 3 (moderate): Secondary | ICD-10-CM | POA: Diagnosis not present

## 2019-05-21 LAB — CBC
HCT: 33.5 % — ABNORMAL LOW (ref 39.0–52.0)
Hemoglobin: 10.5 g/dL — ABNORMAL LOW (ref 13.0–17.0)
MCH: 32.5 pg (ref 26.0–34.0)
MCHC: 31.3 g/dL (ref 30.0–36.0)
MCV: 103.7 fL — ABNORMAL HIGH (ref 80.0–100.0)
Platelets: 155 10*3/uL (ref 150–400)
RBC: 3.23 MIL/uL — ABNORMAL LOW (ref 4.22–5.81)
RDW: 13 % (ref 11.5–15.5)
WBC: 4.3 10*3/uL (ref 4.0–10.5)
nRBC: 0 % (ref 0.0–0.2)

## 2019-05-21 LAB — FERRITIN: Ferritin: 214 ng/mL (ref 24–336)

## 2019-05-21 LAB — IRON AND TIBC
Iron: 44 ug/dL — ABNORMAL LOW (ref 45–182)
Saturation Ratios: 18 % (ref 17.9–39.5)
TIBC: 239 ug/dL — ABNORMAL LOW (ref 250–450)
UIBC: 195 ug/dL

## 2019-05-21 NOTE — Progress Notes (Signed)
Patient is here today for follow up, mentions he feels weak, legs feel tired

## 2019-05-21 NOTE — Progress Notes (Signed)
Hematology/Oncology Consult note [facility]  [phone] [phone]  Patient Care Team: Arnetha Courser, MD as PCP - General (Family Medicine) Lavonia Dana, MD as Consulting Physician (Internal Medicine) Yolonda Kida, MD as Consulting Physician (Cardiology) Anell Barr, OD (Optometry) Sanda Klein Satira Anis, MD as Attending Physician (Family Medicine) Bary Castilla Forest Gleason, MD (General Surgery) Delana Meyer, Dolores Lory, MD (Vascular Surgery)   Name of the patient: Samuel Bryan  [id number]  [birth year]   Date of visit: 05/21/19  Diagnosis- 1.  Leukopenia/neutropenia likely benign 2.  Normocytic anemia likely secondary to chronic kidney disease  Chief complaint/ Reason for visit-routine follow-up of anemia and leukopenia  Heme/Onc history: Patient is a 85 year old African-American male with a past medical history significant for CKD stage III, peripheral vascular disease, hypertension hyperlipidemia among other medical problems.He has been referred to [place] for evaluation of leukopenia. Recent CBC from 10/28/2018 showed white count of 3.7, H&H of 10.4/31.6 and a platelet count of 250. Looking back at his prior white count patient has had a few low values of 3.41 dating back to 2013 and his white count waxes and wanes. His hemoglobin of late has been around 10 likely secondary to his chronic kidney disease. He is active for his age and lives with his daughter. He is independent of his ADLs. Reports that he has a good appetite and denies any unintentional weight loss. Denies any recurrent infections or hospitalizations.  Interval history-patient states that he feels fatigued during the day and most part of the afternoon.  At around 5 or 6 PM in the evening he started feeling better.  He continues to be active for his age and mows his own lawn  ECOG PS- 1 Pain scale- 0   Review of systems- Review of Systems  Constitutional: Positive for  malaise/fatigue. Negative for chills, fever and weight loss.  HENT: Negative for congestion, ear discharge and nosebleeds.   Eyes: Negative for blurred vision.  Respiratory: Negative for cough, hemoptysis, sputum production, shortness of breath and wheezing.   Cardiovascular: Negative for chest pain, palpitations, orthopnea and claudication.  Gastrointestinal: Negative for abdominal pain, blood in stool, constipation, diarrhea, heartburn, melena, nausea and vomiting.  Genitourinary: Negative for dysuria, flank pain, frequency, hematuria and urgency.  Musculoskeletal: Negative for back pain, joint pain and myalgias.  Skin: Negative for rash.  Neurological: Negative for dizziness, tingling, focal weakness, seizures, weakness and headaches.  Endo/Heme/Allergies: Does not bruise/bleed easily.  Psychiatric/Behavioral: Negative for depression and suicidal ideas. The patient does not have insomnia.       No Known Allergies   Past Medical History:  Diagnosis Date  . Anemia   . Atherosclerosis of renal artery ([place])   . Essential hypertension, benign   . Occlusion and stenosis of carotid artery without mention of cerebral infarction    moderate left ICA stenosis  . Peripheral vascular disease, unspecified ([place])    mild lifestyle limiting claudication  . Pure hypercholesterolemia   . Renal insufficiency   . Type 2 diabetes mellitus ([place])      Past Surgical History:  Procedure Laterality Date  . APPENDECTOMY  1970's  . COLONOSCOPY    . coronary artery stenting  1999   at [facility]   . ENDARTERECTOMY Left 12/18/2017   Procedure: ENDARTERECTOMY CAROTID WITH PATCH;  Surgeon: Delana Meyer Dolores Lory, MD;  Location: [facility] ORS;  Service: Vascular;  Laterality: Left;  . FACIAL LACERATION REPAIR Left 12/13/2015   performed at [facility] by Dr. Flonnie Overman  Bhatt  . renal angiography    . VISCERAL ANGIOGRAPHY N/A 08/19/2018   Procedure: VISCERAL ANGIOGRAPHY;  Surgeon: Katha Cabal, MD;  Location: [place]  CV LAB;  Service: Cardiovascular;  Laterality: N/A;    Social History   Socioeconomic History  . Marital status: Widowed    Spouse name: Not on file  . Number of children: 5  . Years of education: Not on file  . Highest education level: Not on file  Occupational History  . Occupation: retired  Scientific laboratory technician  . Financial resource strain: Not hard at all  . Food insecurity    Worry: Never true    Inability: Never true  . Transportation needs    Medical: No    Non-medical: No  Tobacco Use  . Smoking status: Former Smoker    Types: Cigarettes    Quit date: 10/29/1970    Years since quitting: 48.5  . Smokeless tobacco: Never Used  Substance and Sexual Activity  . Alcohol use: No    Alcohol/week: 0.0 standard drinks  . Drug use: No  . Sexual activity: Not Currently  Lifestyle  . Physical activity    Days per week: 7 days    Minutes per session: 30 min  . Stress: Not at all  Relationships  . Social connections    Talks on phone: More than three times a week    Gets together: Three times a week    Attends religious service: More than 4 times per year    Active member of club or organization: No    Attends meetings of clubs or organizations: Never    Relationship status: Widowed  . Intimate partner violence    Fear of current or ex partner: No    Emotionally abused: No    Physically abused: No    Forced sexual activity: No  Other Topics Concern  . Not on file  Social History Narrative   Married, all children are healthy.    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     Family History  Problem Relation Age of Onset  . Diabetes Mother   . Heart disease Mother   . Hypertension Brother   . Hyperlipidemia Brother   . Diabetes Brother   . Hypertension Son   . Hypertension Son   . Birth defects Neg Hx   . Stroke Neg Hx      Current Outpatient Medications:  .  amLODipine (NORVASC) 2.5 MG tablet, Take 1 tablet (2.5 mg  total) by mouth daily., Disp: 90 tablet, Rfl: 3 .  aspirin EC 81 MG tablet, Take 81 mg by mouth daily. , Disp: , Rfl:  .  atorvastatin (LIPITOR) 40 MG tablet, Take 1 tablet (40 mg total) by mouth at bedtime., Disp: 90 tablet, Rfl: 0 .  cholecalciferol (VITAMIN D) 1000 units tablet, Take 1,000 Units by mouth every other day. , Disp: , Rfl:  .  clopidogrel (PLAVIX) 75 MG tablet, TAKE 1 TABLET DAILY., Disp: 90 tablet, Rfl: 0 .  furosemide (LASIX) 40 MG tablet, Take 40 mg by mouth daily., Disp: , Rfl:  .  isosorbide mononitrate (IMDUR) 30 MG 24 hr tablet, Take 1 tablet (30 mg total) by mouth daily., Disp: , Rfl:  .  latanoprost (XALATAN) 0.005 % ophthalmic solution, Place 1 drop into both eyes at bedtime., Disp: , Rfl:  .  linagliptin (TRADJENTA) 5 MG TABS tablet, Take 1 tablet (5 mg total) by mouth  daily. For diabetes, Disp: 90 tablet, Rfl: 3 .  losartan (COZAAR) 100 MG tablet, Take 100 mg by mouth daily., Disp: , Rfl:  .  nitroGLYCERIN (NITROSTAT) 0.4 MG SL tablet, Place 1 tablet (0.4 mg total) under the tongue every 5 (five) minutes as needed for chest pain. Max of 3 pills; call 911, Disp: 25 tablet, Rfl: 3 .  Omega-3 Fatty Acids (FISH OIL) 1000 MG CAPS, Take 1,000 mg by mouth daily. , Disp: , Rfl:  .  timolol (BETIMOL) 0.5 % ophthalmic solution, Place 1 drop into both eyes 2 (two) times daily. (Patient taking differently: Place 1 drop into both eyes at bedtime. ), Disp: 10 mL, Rfl: 12 .  vitamin B-12 (CYANOCOBALAMIN) 1000 MCG tablet, Take 1,000 mcg by mouth daily., Disp: , Rfl:   Physical exam:  Vitals:   05/21/19 0954  BP: (!) 130/50  Resp: 16  Temp: 97.6 F (36.4 C)  TempSrc: Tympanic  Weight: 134 lb 9.6 oz (61.1 kg)   Physical Exam Constitutional:      General: He is not in acute distress.    Comments: He ambulates with a cane  HENT:     Head: Normocephalic and atraumatic.  Eyes:     Pupils: Pupils are equal, round, and reactive to light.  Neck:     Musculoskeletal: Normal range  of motion.  Cardiovascular:     Rate and Rhythm: Normal rate and regular rhythm.     Heart sounds: Normal heart sounds.  Pulmonary:     Effort: Pulmonary effort is normal.     Breath sounds: Normal breath sounds.  Abdominal:     General: Bowel sounds are normal.     Palpations: Abdomen is soft.  Skin:    General: Skin is warm and dry.  Neurological:     Mental Status: He is alert and oriented to person, place, and time.      CMP Latest Ref Rng & Units 11/10/2018  Glucose 70 - 99 mg/dL 115(H)  BUN 8 - 23 mg/dL 37(H)  Creatinine 0.61 - 1.24 mg/dL 1.66(H)  Sodium 135 - 145 mmol/L 141  Potassium 3.5 - 5.1 mmol/L 4.5  Chloride 98 - 111 mmol/L 104  CO2 22 - 32 mmol/L 31  Calcium 8.9 - 10.3 mg/dL 9.5  Total Protein 6.5 - 8.1 g/dL 8.0  Total Bilirubin 0.3 - 1.2 mg/dL 0.7  Alkaline Phos 38 - 126 U/L 81  AST 15 - 41 U/L 19  ALT 0 - 44 U/L 11   CBC Latest Ref Rng & Units 05/21/2019  WBC 4.0 - 10.5 K/uL 4.3  Hemoglobin 13.0 - 17.0 g/dL 10.5(L)  Hematocrit 39.0 - 52.0 % 33.5(L)  Platelets 150 - 400 K/uL 155     Assessment and plan- Patient is a 85 y.o. male with the following issues:  1.  Leukopenia/neutropenia: Today patient's white count is normal at 4.3.  In the past his white count fluctuates around 3.5 with an [place] that ranges between 1.62.  This is likely benign since it is chronic and dating back to last 2 years.  Continue to monitor  2.  Anemia of chronic kidney disease: Patient does have baseline normocytic anemia and his hemoglobin ranges between 10-11 possibly secondary to chronic kidney disease.  He did receive IV iron in the past but has not had much of an improvement in his anemia.  Iron studies from today are pending.  Continue to monitor.  I will see him back in 6 months time with  CBC with differential, ferritin and iron studies and B12   Visit Diagnosis 1. Anemia in stage 3 chronic kidney disease ([place])   2. Neutropenia, unspecified type [facility])      Dr. Randa Evens, MD, MPH [facility] [phone] 05/21/2019 11:11 AM

## 2019-05-29 ENCOUNTER — Encounter: Payer: Medicare Other | Admitting: Family Medicine

## 2019-06-17 ENCOUNTER — Other Ambulatory Visit: Payer: Self-pay

## 2019-06-17 ENCOUNTER — Encounter: Payer: Self-pay | Admitting: Nurse Practitioner

## 2019-06-17 ENCOUNTER — Ambulatory Visit (INDEPENDENT_AMBULATORY_CARE_PROVIDER_SITE_OTHER): Payer: Medicare Other | Admitting: Nurse Practitioner

## 2019-06-17 VITALS — BP 146/60 | HR 64 | Temp 96.9°F | Resp 14 | Ht 69.0 in | Wt 134.4 lb

## 2019-06-17 DIAGNOSIS — N183 Chronic kidney disease, stage 3 unspecified: Secondary | ICD-10-CM

## 2019-06-17 DIAGNOSIS — I700 Atherosclerosis of aorta: Secondary | ICD-10-CM

## 2019-06-17 DIAGNOSIS — E1122 Type 2 diabetes mellitus with diabetic chronic kidney disease: Secondary | ICD-10-CM | POA: Diagnosis not present

## 2019-06-17 DIAGNOSIS — Z0000 Encounter for general adult medical examination without abnormal findings: Secondary | ICD-10-CM

## 2019-06-17 NOTE — Patient Instructions (Addendum)
- increase protein in diet or add 2-3 high protein ensure shakes a week  Preventive Care 65 Years and Older, Male Preventive care refers to lifestyle choices and visits with your health care provider that can promote health and wellness. This includes:  A yearly physical exam. This is also called an annual well check.  Regular dental and eye exams.  Immunizations.  Screening for certain conditions.  Healthy lifestyle choices, such as diet and exercise. What can I expect for my preventive care visit? Physical exam Your health care provider will check:  Height and weight. These may be used to calculate body mass index (BMI), which is a measurement that tells if you are at a healthy weight.  Heart rate and blood pressure.  Your skin for abnormal spots. Counseling Your health care provider may ask you questions about:  Alcohol, tobacco, and drug use.  Emotional well-being.  Home and relationship well-being.  Sexual activity.  Eating habits.  History of falls.  Memory and ability to understand (cognition).  Work and work Statistician. What immunizations do I need?  Influenza (flu) vaccine  This is recommended every year. Tetanus, diphtheria, and pertussis (Tdap) vaccine  You may need a Td booster every 10 years. Varicella (chickenpox) vaccine  You may need this vaccine if you have not already been vaccinated. Zoster (shingles) vaccine  You may need this after age 54. Pneumococcal conjugate (PCV13) vaccine  One dose is recommended after age 4. Pneumococcal polysaccharide (PPSV23) vaccine  One dose is recommended after age 35. Measles, mumps, and rubella (MMR) vaccine  You may need at least one dose of MMR if you were born in 1957 or later. You may also need a second dose. Meningococcal conjugate (MenACWY) vaccine  You may need this if you have certain conditions. Hepatitis A vaccine  You may need this if you have certain conditions or if you travel or  work in places where you may be exposed to hepatitis A. Hepatitis B vaccine  You may need this if you have certain conditions or if you travel or work in places where you may be exposed to hepatitis B. Haemophilus influenzae type b (Hib) vaccine  You may need this if you have certain conditions. You may receive vaccines as individual doses or as more than one vaccine together in one shot (combination vaccines). Talk with your health care provider about the risks and benefits of combination vaccines. What tests do I need? Blood tests  Lipid and cholesterol levels. These may be checked every 5 years, or more frequently depending on your overall health.  Hepatitis C test.  Hepatitis B test. Screening  Lung cancer screening. You may have this screening every year starting at age 8 if you have a 30-pack-year history of smoking and currently smoke or have quit within the past 15 years.  Colorectal cancer screening. All adults should have this screening starting at age 33 and continuing until age 50. Your health care provider may recommend screening at age 47 if you are at increased risk. You will have tests every 1-10 years, depending on your results and the type of screening test.  Prostate cancer screening. Recommendations will vary depending on your family history and other risks.  Diabetes screening. This is done by checking your blood sugar (glucose) after you have not eaten for a while (fasting). You may have this done every 1-3 years.  Abdominal aortic aneurysm (AAA) screening. You may need this if you are a current or former smoker.  Sexually transmitted disease (STD) testing. Follow these instructions at home: Eating and drinking  Eat a diet that includes fresh fruits and vegetables, whole grains, lean protein, and low-fat dairy products. Limit your intake of foods with high amounts of sugar, saturated fats, and salt.  Take vitamin and mineral supplements as recommended by your  health care provider.  Do not drink alcohol if your health care provider tells you not to drink.  If you drink alcohol: ? Limit how much you have to 0-2 drinks a day. ? Be aware of how much alcohol is in your drink. In the [place] one drink equals one 12 oz bottle of beer (355 mL), one 5 oz glass of wine (148 mL), or one 1 oz glass of hard liquor (44 mL). Lifestyle  Take daily care of your teeth and gums.  Stay active. Exercise for at least 30 minutes on 5 or more days each week.  Do not use any products that contain nicotine or tobacco, such as cigarettes, e-cigarettes, and chewing tobacco. If you need help quitting, ask your health care provider.  If you are sexually active, practice safe sex. Use a condom or other form of protection to prevent STIs (sexually transmitted infections).  Talk with your health care provider about taking a low-dose aspirin or statin. What's next?  Visit your health care provider once a year for a well check visit.  Ask your health care provider how often you should have your eyes and teeth checked.  Stay up to date on all vaccines. This information is not intended to replace advice given to you by your health care provider. Make sure you discuss any questions you have with your health care provider. Document Released: 11/11/2015 Document Revised: 10/09/2018 Document Reviewed: 10/09/2018 Elsevier Patient Education  2020 [employer].

## 2019-06-17 NOTE — Progress Notes (Signed)
Name: Samuel Bryan.   MRN: [id number]    DOB: May 03, 1927   Date:06/17/2019       Progress Note  Subjective  Chief Complaint  Chief Complaint  Patient presents with  . Annual Exam  . Fatigue    HPI  Patient presents for annual CPE .  USPSTF grade A and B recommendations:  Diet:  Eats three meals a day Breakfast- cereal and milk, drinks ensure, occasionally a fried egg Lunch- green beans, butter beans, mostly vegetables Dinner- chicken and vegetables,  Drink water- 3-4 bottles of water Exercise:  [place] Mows the lawn, weeds.   Depression: phq 9 is negative Depression screen [facility] 2/9 06/17/2019 03/06/2019 01/29/2019 08/21/2018 07/25/2018  Decreased Interest 0 0 0 0 0  Down, Depressed, Hopeless 0 0 0 0 0  PHQ - 2 Score 0 0 0 0 0  Altered sleeping 0 0 - - -  Tired, decreased energy 0 0 - - -  Change in appetite 0 0 - - -  Feeling bad or failure about yourself  0 0 - - -  Trouble concentrating 0 0 - - -  Moving slowly or fidgety/restless 0 0 - - -  Suicidal thoughts 0 0 - - -  PHQ-9 Score 0 0 - - -  Difficult doing work/chores Not difficult at all Not difficult at all - - -    Hypertension:  BP Readings from Last 3 Encounters:  06/17/19 (!) 146/60  05/21/19 (!) 130/50  04/08/19 (!) 172/68    Obesity: Wt Readings from Last 3 Encounters:  06/17/19 134 lb 6.4 oz (61 kg)  05/21/19 134 lb 9.6 oz (61.1 kg)  01/29/19 142 lb (64.4 kg)   BMI Readings from Last 3 Encounters:  06/17/19 19.85 kg/m  05/21/19 21.73 kg/m  01/29/19 22.92 kg/m     Lipids:  Lab Results  Component Value Date   CHOL 122 08/21/2018   CHOL 125 02/18/2018   CHOL 139 08/09/2017   Lab Results  Component Value Date   HDL 33 (L) 08/21/2018   HDL 38 (L) 02/18/2018   HDL 59 08/09/2017   Lab Results  Component Value Date   LDLCALC 74 08/21/2018   LDLCALC 73 02/18/2018   LDLCALC 66 08/09/2017   Lab Results  Component Value Date   TRIG 74 08/21/2018   TRIG 62 02/18/2018    TRIG 49 08/09/2017   Lab Results  Component Value Date   CHOLHDL 3.7 08/21/2018   CHOLHDL 3.3 02/18/2018   CHOLHDL 2.4 08/09/2017   No results found for: LDLDIRECT Glucose:  Glucose  Date Value Ref Range Status  04/26/2012 105 (H) 65 - 99 mg/dL Final   Glucose, Bld  Date Value Ref Range Status  11/10/2018 115 (H) 70 - 99 mg/dL Final  07/25/2018 169 (H) 70 - 99 mg/dL Final  12/19/2017 116 (H) 65 - 99 mg/dL Final   Glucose-Capillary  Date Value Ref Range Status  08/19/2018 97 70 - 99 mg/dL Final  12/19/2017 90 65 - 99 mg/dL Final  12/18/2017 129 (H) 65 - 99 mg/dL Final      Office Visit from 06/17/2019 in [facility]  AUDIT-C Score  0       Widowed STD testing and prevention (HIV/chl/gon/syphilis): declines Hep C: declines  Skin cancer:discussed  Colorectal cancer: no longer recommend routine screening, no changes in bowel movement  IPSS Questionnaire (AUA-7): Over the past month.   1)  How often have you had  a sensation of not emptying your bladder completely after you finish urinating?  0 - Not at all  2)  How often have you had to urinate again less than two hours after you finished urinating? 0 - Not at all  3)  How often have you found you stopped and started again several times when you urinated?  0 - Not at all  4) How difficult have you found it to postpone urination?  0 - Not at all  5) How often have you had a weak urinary stream?  0 - Not at all  6) How often have you had to push or strain to begin urination?  0 - Not at all  7) How many times did you most typically get up to urinate from the time you went to bed until the time you got up in the morning?  0 - None  Total score:  0-7 mildly symptomatic   8-19 moderately symptomatic   20-35 severely symptomatic    Lung cancer:  Low Dose CT Chest recommended if Age 63-80 years, 30 pack-year currently smoking OR have quit w/in 15years. Patient does not qualify.     Advanced Care  Planning: A voluntary discussion about advance care planning including the explanation and discussion of advance directives.  Discussed health care proxy and Living will, and the patient was able to identify a health care proxy as Son Herchel Mcnitt.  Patient does have a living will at present time. If patient does have living will, I have requested they bring this to the clinic to be scanned in to their chart.  Patient Active Problem List   Diagnosis Date Noted  . Iron deficiency anemia 11/24/2018  . Aortic atherosclerosis ([place]) 07/28/2018  . Cholelithiasis 07/28/2018  . Pancreatic lesion 07/28/2018  . Chronic mesenteric ischemia ([place]) 07/28/2018  . Full code status 01/23/2018  . Memory loss 01/23/2018  . Carotid stenosis, asymptomatic, left 12/18/2017  . Degenerative arthritis of left shoulder region 08/19/2017  . Microalbuminuria 08/19/2017  . On angiotensin receptor blockers (ARB) 08/19/2017  . Type 2 diabetes mellitus ([place]) 08/13/2017  . Preventative health care 12/11/2016  . Abdominal mass 04/11/2016  . Abdominal mass, right upper quadrant 04/03/2016  . Bilateral carotid bruits 04/03/2016  . Medication monitoring encounter 04/03/2016  . Facial injury 10/26/2015  . 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 Arteriosclerosis of coronary artery 10/11/2015  . H/O angina pectoris 10/11/2015  . Hyperlipidemia 09/13/2015  . Chronic kidney disease, stage III (moderate) ([place]) 09/13/2015  . RENAL ATHEROSCLEROSIS 03/16/2009  . Essential hypertension 03/15/2009  . Coronary atherosclerosis 03/15/2009  . Carotid arterial disease ([place]) 03/15/2009  . Peripheral vascular disease ([place]) 03/15/2009  . Anemia in chronic kidney disease (CKD) 03/15/2009    Past Surgical History:  Procedure Laterality Date  . APPENDECTOMY  1970's  . COLONOSCOPY    . coronary artery stenting  1999   at [facility]   . ENDARTERECTOMY Left 12/18/2017   Procedure: ENDARTERECTOMY CAROTID WITH PATCH;  Surgeon: Delana Meyer Dolores Lory, MD;  Location: [facility] ORS;  Service: Vascular;  Laterality: Left;  . FACIAL LACERATION REPAIR Left 12/13/2015   performed at [facility] by Dr. Marcial Pacas  . renal angiography    . VISCERAL ANGIOGRAPHY N/A 08/19/2018   Procedure: VISCERAL ANGIOGRAPHY;  Surgeon: Katha Cabal, MD;  Location: [place] [facility];  Service: Cardiovascular;  Laterality: N/A;    Family History  Problem Relation Age of Onset  . Diabetes Mother   . Heart  disease Mother   . Hypertension Brother   . Hyperlipidemia Brother   . Diabetes Brother   . Hypertension Son   . Hypertension Son   . Birth defects Neg Hx   . Stroke Neg Hx     Social History   Socioeconomic History  . Marital status: Widowed    Spouse name: Not on file  . Number of children: 5  . Years of education: 10  . Highest education level: Not on file  Occupational History  . Occupation: retired  Scientific laboratory technician  . Financial resource strain: Not hard at all  . Food insecurity    Worry: Never true    Inability: Never true  . Transportation needs    Medical: No    Non-medical: No  Tobacco Use  . Smoking status: Former Smoker    Types: Cigarettes    Quit date: 10/29/1970    Years since quitting: 48.6  . Smokeless tobacco: Never Used  Substance and Sexual Activity  . Alcohol use: No    Alcohol/week: 0.0 standard drinks  . Drug use: No  . Sexual activity: Not Currently  Lifestyle  . Physical activity    Days per week: 0 days    Minutes per session: 0 min  . Stress: Not at all  Relationships  . Social connections    Talks on phone: More than three times a week    Gets together: Three times a week    Attends religious service: More than 4 times per year    Active member of club or organization: No    Attends meetings of clubs or organizations: Never    Relationship status: Widowed  . Intimate partner violence    Fear of current or ex partner: No    Emotionally abused: No    Physically abused: No    Forced sexual activity: No   Other Topics Concern  . Not on file  Social History Narrative   widowed, all children are healthy.    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      Current Outpatient Medications:  .  amLODipine (NORVASC) 2.5 MG tablet, Take 1 tablet (2.5 mg total) by mouth daily., Disp: 90 tablet, Rfl: 3 .  aspirin EC 81 MG tablet, Take 81 mg by mouth daily. , Disp: , Rfl:  .  atorvastatin (LIPITOR) 40 MG tablet, Take 1 tablet (40 mg total) by mouth at bedtime., Disp: 90 tablet, Rfl: 0 .  cholecalciferol (VITAMIN D) 1000 units tablet, Take 1,000 Units by mouth every other day. , Disp: , Rfl:  .  clopidogrel (PLAVIX) 75 MG tablet, TAKE 1 TABLET DAILY., Disp: 90 tablet, Rfl: 0 .  ferrous sulfate 325 (65 FE) MG EC tablet, Take 325 mg by mouth 3 (three) times daily with meals., Disp: , Rfl:  .  furosemide (LASIX) 40 MG tablet, Take 40 mg by mouth daily., Disp: , Rfl:  .  isosorbide mononitrate (IMDUR) 30 MG 24 hr tablet, Take 1 tablet (30 mg total) by mouth daily., Disp: , Rfl:  .  latanoprost (XALATAN) 0.005 % ophthalmic solution, Place 1 drop into both eyes at bedtime., Disp: , Rfl:  .  linagliptin (TRADJENTA) 5 MG TABS tablet, Take 1 tablet (5 mg total) by mouth daily. For diabetes, Disp: 90 tablet, Rfl: 3 .  losartan (COZAAR) 100 MG tablet, Take 100 mg by mouth daily., Disp: , Rfl:  .  Multiple Vitamin (MULTIVITAMIN) tablet, Take 1  tablet by mouth daily., Disp: , Rfl:  .  nitroGLYCERIN (NITROSTAT) 0.4 MG SL tablet, Place 1 tablet (0.4 mg total) under the tongue every 5 (five) minutes as needed for chest pain. Max of 3 pills; call 911, Disp: 25 tablet, Rfl: 3 .  Omega-3 Fatty Acids (FISH OIL) 1000 MG CAPS, Take 1,000 mg by mouth daily. , Disp: , Rfl:  .  timolol (BETIMOL) 0.5 % ophthalmic solution, Place 1 drop into both eyes 2 (two) times daily. (Patient taking differently: Place 1 drop into both eyes at bedtime. ), Disp: 10 mL, Rfl: 12 .  vitamin B-12  (CYANOCOBALAMIN) 1000 MCG tablet, Take 1,000 mcg by mouth daily., Disp: , Rfl:   No Known Allergies   Review of Systems  Constitutional: Negative for chills, fever and malaise/fatigue.  Respiratory: Negative for cough and shortness of breath.   Cardiovascular: Negative for chest pain, palpitations and leg swelling.  Gastrointestinal: Negative for abdominal pain.  Musculoskeletal: Negative for joint pain and myalgias.  Skin: Negative for rash.  Neurological: Negative for dizziness and headaches.  Psychiatric/Behavioral: The patient is not nervous/anxious and does not have insomnia.       Objective  Vitals:   06/17/19 1038  BP: (!) 146/60  Pulse: 64  Resp: 14  Temp: (!) 96.9 F (36.1 C)  SpO2: 93%  Weight: 134 lb 6.4 oz (61 kg)  Height: 5\' 9"  (1.753 m)    Body mass index is 19.85 kg/m.  Physical Exam Constitutional: Patient appears well-developed and thin . No distress.  HENT: Head: Normocephalic and atraumatic. Ears: B TMs ok, no erythema or effusion; hearing aid in left ear Eyes: Conjunctivae and EOM are normal. Pupils are equal, round, and reactive to light. No scleral icterus.  Neck: Normal range of motion. Neck supple. No JVD present. No thyromegaly present.  Cardiovascular: Normal rate, regular rhythm and normal heart sounds.  No murmur heard. mild BLE edema. Pulmonary/Chest: Effort normal and breath sounds normal. No respiratory distress. Abdominal: Soft. Bowel sounds are normal, no distension. There is no tenderness. no masses MALE GENITALIA: deferred Musculoskeletal: Normal range of motion, no joint effusions. No gross deformities Neurological: he is alert and oriented to person, place, and time. No cranial nerve deficit. Coordination, balance, strength, speech and gait are normal.  Skin: Skin is warm and dry. No rash noted. No erythema.  Psychiatric: Patient has a normal mood and affect. behavior is normal. Judgment and thought content normal.   Recent  Results (from the past 2160 hour(s))  Iron and TIBC     Status: Abnormal   Collection Time: 05/21/19  9:39 AM  Result Value Ref Range   Iron 44 (L) 45 - 182 ug/dL   TIBC 239 (L) 250 - 450 ug/dL   Saturation Ratios 18 17.9 - 39.5 %   UIBC 195 ug/dL    Comment: Performed at [facility], [address] [place], [place] [address]  Ferritin     Status: None   Collection Time: 05/21/19  9:39 AM  Result Value Ref Range   Ferritin 214 24 - 336 ng/mL    Comment: Performed at [facility], [address] [place], [place] [address]  CBC     Status: Abnormal   Collection Time: 05/21/19  9:39 AM  Result Value Ref Range   WBC 4.3 4.0 - 10.5 K/uL   RBC 3.23 (L) 4.22 - 5.81 MIL/uL   Hemoglobin 10.5 (L) 13.0 - 17.0 g/dL   HCT 33.5 (L) 39.0 -  52.0 %   MCV 103.7 (H) 80.0 - 100.0 fL   MCH 32.5 26.0 - 34.0 pg   MCHC 31.3 30.0 - 36.0 g/dL   RDW 13.0 11.5 - 15.5 %   Platelets 155 150 - 400 K/uL   nRBC 0.0 0.0 - 0.2 %    Comment: Performed at [facility] [place], [address] [place], [place] [address]       Fall Risk: Fall Risk  06/17/2019 03/06/2019 01/29/2019 08/21/2018 07/28/2018  Falls in the past year? 0 0 0 No No  Number falls in past yr: 0 0 0 - -  Injury with Fall? 0 0 0 - -  Follow up - - Falls prevention discussed - -     Functional Status Survey: Is the patient deaf or have difficulty hearing?: No Does the patient have difficulty seeing, even when wearing glasses/contacts?: No Does the patient have difficulty concentrating, remembering, or making decisions?: No Does the patient have difficulty walking or climbing stairs?: No Does the patient have difficulty dressing or bathing?: No Does the patient have difficulty doing errands alone such as visiting a doctor's office or shopping?: No    Assessment & Plan  1. Preventative health care Requesting to get blood work at 3 month follow-up; discussed supplementation, importance of protein in diet and keeping  healthy weight.  - COMPLETE METABOLIC PANEL WITH GFR - Lipid Profile - HgB A1c    -Prostate cancer screening and PSA options (with potential risks and benefits of testing vs not testing) were discussed along with recent recs/guidelines. -USPSTF grade A and B recommendations reviewed with patient; age-appropriate recommendations, preventive care, screening tests, etc discussed and encouraged; healthy living encouraged; see AVS for patient education given to patient -Discussed importance of 150 minutes of physical activity weekly, eat two servings of fish weekly, eat one serving of tree nuts ( cashews, pistachios, pecans, almonds.Marland Kitchen) every other day, eat 6 servings of fruit/vegetables daily and drink plenty of water and avoid sweet beverages.  -Reviewed Health Maintenance: will get flu vaccine at follow up

## 2019-07-09 ENCOUNTER — Encounter: Payer: Self-pay | Admitting: Podiatry

## 2019-07-09 ENCOUNTER — Other Ambulatory Visit: Payer: Self-pay

## 2019-07-09 ENCOUNTER — Ambulatory Visit (INDEPENDENT_AMBULATORY_CARE_PROVIDER_SITE_OTHER): Payer: Medicare Other | Admitting: Podiatry

## 2019-07-09 DIAGNOSIS — E1122 Type 2 diabetes mellitus with diabetic chronic kidney disease: Secondary | ICD-10-CM | POA: Diagnosis not present

## 2019-07-09 DIAGNOSIS — B351 Tinea unguium: Secondary | ICD-10-CM | POA: Diagnosis not present

## 2019-07-09 DIAGNOSIS — N183 Chronic kidney disease, stage 3 (moderate): Secondary | ICD-10-CM

## 2019-07-09 DIAGNOSIS — D689 Coagulation defect, unspecified: Secondary | ICD-10-CM | POA: Diagnosis not present

## 2019-07-09 DIAGNOSIS — M79676 Pain in unspecified toe(s): Secondary | ICD-10-CM

## 2019-07-09 NOTE — Progress Notes (Addendum)
Complaint:  Visit Type: Patient returns to my office for continued preventative foot care services. Complaint: Patient states" my nails have grown long and thick and become painful to walk and wear shoes" Patient has been diagnosed with DM with no foot complications. The patient presents for preventative foot care services. No changes to ROS.  He presents to this office with his daughter [name].. Patient is taking plavix.  Podiatric Exam: Vascular: dorsalis pedis and posterior tibial pulses are palpable bilateral. Capillary return is immediate. Temperature gradient is WNL. Skin turgor WNL  Sensorium: Normal Semmes Weinstein monofilament test. Normal tactile sensation bilaterally. Nail Exam: Pt has thick disfigured discolored nails with subungual debris noted bilateral entire nail hallux through fifth toenails Ulcer Exam: There is no evidence of ulcer or pre-ulcerative changes or infection. Orthopedic Exam: Muscle tone and strength are WNL. No limitations in general ROM. No crepitus or effusions noted. Foot type and digits show no abnormalities. HAV  B/L. Skin: No Porokeratosis. No infection or ulcers  Diagnosis:  Onychomycosis, , Pain in right toe, pain in left toes  Treatment & Plan Procedures and Treatment: Consent by patient was obtained for treatment procedures.   Debridement of mycotic and hypertrophic toenails, 1 through 5 bilateral and clearing of subungual debris. No ulceration, no infection noted. The daughter asked if I provided any treatment for his cuticles.  She also asked if I applied lotion to his feet.  I responded no.   Return Visit-Office Procedure: Patient instructed to return to the office for a follow up visit 3 months for continued preventative foot care services.    Gardiner Barefoot DPM

## 2019-07-10 ENCOUNTER — Other Ambulatory Visit: Payer: Self-pay

## 2019-07-10 MED ORDER — ATORVASTATIN CALCIUM 40 MG PO TABS: 40.0000 mg | ORAL_TABLET | Freq: Every day | ORAL | 0 refills | Status: DC

## 2019-07-10 NOTE — Telephone Encounter (Signed)
Pt needs to come in and have labs done, no lipid since one year ago, no chemistry since January, would like his labs done within the next month then I can return to 90 d supply

## 2019-07-13 ENCOUNTER — Encounter (INDEPENDENT_AMBULATORY_CARE_PROVIDER_SITE_OTHER): Payer: Self-pay | Admitting: Vascular Surgery

## 2019-07-13 ENCOUNTER — Encounter: Payer: Self-pay | Admitting: Family Medicine

## 2019-07-13 ENCOUNTER — Ambulatory Visit (INDEPENDENT_AMBULATORY_CARE_PROVIDER_SITE_OTHER): Payer: Medicare Other

## 2019-07-13 ENCOUNTER — Other Ambulatory Visit: Payer: Self-pay

## 2019-07-13 ENCOUNTER — Ambulatory Visit (INDEPENDENT_AMBULATORY_CARE_PROVIDER_SITE_OTHER): Payer: Medicare Other | Admitting: Vascular Surgery

## 2019-07-13 VITALS — BP 178/69 | HR 78 | Resp 12 | Ht 66.0 in | Wt 129.0 lb

## 2019-07-13 DIAGNOSIS — I739 Peripheral vascular disease, unspecified: Secondary | ICD-10-CM

## 2019-07-13 DIAGNOSIS — E1122 Type 2 diabetes mellitus with diabetic chronic kidney disease: Secondary | ICD-10-CM

## 2019-07-13 DIAGNOSIS — I2510 Atherosclerotic heart disease of native coronary artery without angina pectoris: Secondary | ICD-10-CM

## 2019-07-13 DIAGNOSIS — N2581 Secondary hyperparathyroidism of renal origin: Secondary | ICD-10-CM | POA: Insufficient documentation

## 2019-07-13 DIAGNOSIS — K551 Chronic vascular disorders of intestine: Secondary | ICD-10-CM | POA: Diagnosis not present

## 2019-07-13 DIAGNOSIS — I779 Disorder of arteries and arterioles, unspecified: Secondary | ICD-10-CM

## 2019-07-13 DIAGNOSIS — I10 Essential (primary) hypertension: Secondary | ICD-10-CM | POA: Diagnosis not present

## 2019-07-13 DIAGNOSIS — N183 Chronic kidney disease, stage 3 unspecified: Secondary | ICD-10-CM

## 2019-07-13 NOTE — Progress Notes (Signed)
MRN : [id number]  Knowledge Baquero. is a 85 y.o. (1926-12-11) male who presents with chief complaint of No chief complaint on file. Marland Kitchen  History of Present Illness:   The patient returns to the office for followup and review status post angiogram with intervention on 08/20/2019.  Procedure(s) Performed:             1.  Abdominal aortogram             2.  Bilateral iliofemoral runoff             3.  Percutaneous transluminal angioplasty and stent placement right common iliac artery; "kissing balloon" technique             4.  Percutaneous transluminal and plasty and stent placement left common iliac artery; "kissing balloon" technique             5.  Ultrasound guided access bilateral common femoral arteries             6.  Introduction catheter into celiac artery             7.  Percutaneous transluminal angioplasty and stent placement celiac artery             8.  StarClose closure device bilateral common femoral arteries  The patient notes improvement in the lower extremity symptoms. No interval shortening of the patient's claudication distance or rest pain symptoms. Previous wounds have now healed.  No new ulcers or wounds have occurred since the last visit.  There have been no significant changes to the patient's overall health care.  He is s/p left CEA 12/18/2017.  The patient denies amaurosis fugax or recent TIA symptoms. There are no recent neurological changes noted. The patient denies history of DVT, PE or superficial thrombophlebitis. The patient denies recent episodes of angina or shortness of breath.   ABI's Rt=1.00 and Lt=0.50  (previous ABI's Rt=0.55 and Lt=0.44) Duplex US of the mesenteric arterial system shows patent celiac stent  No outpatient medications have been marked as taking for the 07/13/19 encounter (Appointment) with Delana Meyer, Dolores Lory, MD.    Past Medical History:  Diagnosis Date  . Anemia   . Atherosclerosis of renal artery ([place])   . Essential  hypertension, benign   . Occlusion and stenosis of carotid artery without mention of cerebral infarction    moderate left ICA stenosis  . Peripheral vascular disease, unspecified ([place])    mild lifestyle limiting claudication  . Pure hypercholesterolemia   . Renal insufficiency   . Type 2 diabetes mellitus ([place])     Past Surgical History:  Procedure Laterality Date  . APPENDECTOMY  1970's  . COLONOSCOPY    . coronary artery stenting  1999   at [facility]   . ENDARTERECTOMY Left 12/18/2017   Procedure: ENDARTERECTOMY CAROTID WITH PATCH;  Surgeon: Delana Meyer Dolores Lory, MD;  Location: [facility] ORS;  Service: Vascular;  Laterality: Left;  . FACIAL LACERATION REPAIR Left 12/13/2015   performed at [facility] by Dr. Marcial Pacas  . renal angiography    . VISCERAL ANGIOGRAPHY N/A 08/19/2018   Procedure: VISCERAL ANGIOGRAPHY;  Surgeon: Katha Cabal, MD;  Location: [facility];  Service: Cardiovascular;  Laterality: N/A;    Social History Social History   Tobacco Use  . Smoking status: Former Smoker    Types: Cigarettes    Quit date: 10/29/1970    Years since quitting: 48.7  . Smokeless tobacco: Never Used  Substance Use Topics  .  Alcohol use: No    Alcohol/week: 0.0 standard drinks  . Drug use: No    Family History Family History  Problem Relation Age of Onset  . Diabetes Mother   . Heart disease Mother   . Hypertension Brother   . Hyperlipidemia Brother   . Diabetes Brother   . Hypertension Son   . Hypertension Son   . Birth defects Neg Hx   . Stroke Neg Hx     No Known Allergies   REVIEW OF SYSTEMS (Negative unless checked)  Constitutional: [] Weight loss  [] Fever  [] Chills Cardiac: [] Chest pain   [] Chest pressure   [] Palpitations   [] Shortness of breath when laying flat   [] Shortness of breath with exertion. Vascular:  [] Pain in legs with walking   [] Pain in legs at rest  [] History of DVT   [] Phlebitis   [x] Swelling in legs   [] Varicose veins   [] Non-healing ulcers  Pulmonary:   [] Uses home oxygen   [] Productive cough   [] Hemoptysis   [] Wheeze  [] COPD   [] Asthma Neurologic:  [] Dizziness   [] Seizures   [] History of stroke   [] History of TIA  [] Aphasia   [] Vissual changes   [] Weakness or numbness in arm   [] Weakness or numbness in leg Musculoskeletal:   [] Joint swelling   [] Joint pain   [] Low back pain Hematologic:  [] Easy bruising  [] Easy bleeding   [] Hypercoagulable state   [] Anemic Gastrointestinal:  [] Diarrhea   [] Vomiting  [] Gastroesophageal reflux/heartburn   [] Difficulty swallowing. Genitourinary:  [] Chronic kidney disease   [] Difficult urination  [] Frequent urination   [] Blood in urine Skin:  [] Rashes   [] Ulcers  Psychological:  [] History of anxiety   []  History of major depression.  Physical Examination  There were no vitals filed for this visit. There is no height or weight on file to calculate BMI. Gen: WD/WN, NAD Head: [place]/AT, No temporalis wasting.  Ear/Nose/Throat: Hearing grossly intact, nares w/o erythema or drainage Eyes: PER, EOMI, sclera nonicteric.  Neck: Supple, no large masses.   Pulmonary:  Good air movement, no audible wheezing bilaterally, no use of accessory muscles.  Cardiac: RRR, no JVD Vascular:  Vessel Right Left  PT Not Palpable Not Palpable  DP Not Palpable Not Palpable  Gastrointestinal: Non-distended. No guarding/no peritoneal signs.  Musculoskeletal: M/S 5/5 throughout.  No deformity or atrophy.  Neurologic: CN 2-12 intact. Symmetrical.  Speech is fluent. Motor exam as listed above. Psychiatric: Judgment intact, Mood & affect appropriate for pt's clinical situation. Dermatologic: No rashes or ulcers noted.  No changes consistent with cellulitis. Lymph : No lichenification or skin changes of chronic lymphedema.  CBC Lab Results  Component Value Date   WBC 4.3 05/21/2019   HGB 10.5 (L) 05/21/2019   HCT 33.5 (L) 05/21/2019   MCV 103.7 (H) 05/21/2019   PLT 155 05/21/2019    BMET    Component Value Date/Time    NA 141 11/10/2018 1136   NA 137 04/09/2016 1008   NA 137 04/26/2012 1145   K 4.5 11/10/2018 1136   K 4.0 04/26/2012 1145   CL 104 11/10/2018 1136   CL 104 04/26/2012 1145   CO2 31 11/10/2018 1136   CO2 29 04/26/2012 1145   GLUCOSE 115 (H) 11/10/2018 1136   GLUCOSE 105 (H) 04/26/2012 1145   BUN 37 (H) 11/10/2018 1136   BUN 19 04/09/2016 1008   BUN 16 04/26/2012 1145   CREATININE 1.66 (H) 11/10/2018 1136   CREATININE 1.63 (H) 08/09/2017 1202   CALCIUM 9.5  11/10/2018 1136   CALCIUM 8.9 04/26/2012 1145   GFRNONAA 36 (L) 11/10/2018 1136   GFRNONAA 37 (L) 08/09/2017 1202   GFRAA 42 (L) 11/10/2018 1136   GFRAA 43 (L) 08/09/2017 1202   CrCl cannot be calculated (Patient's most recent lab result is older than the maximum 21 days allowed.).  COAG Lab Results  Component Value Date   INR 1.06 12/10/2017    Radiology No results found.   Assessment/Plan 1. Chronic mesenteric ischemia (HCC) Recommend:  The patient is status post successful angiogram with intervention of the mesenteric vessels.  Celiac stenting was performed.  The patient reports that the abdominal pain is improved and the post prandial symptoms are essentially gone.   The patient denies lifestyle limiting changes at this point in time.  No further invasive studies, angiography or surgery at this time The patient should continue walking and begin a more formal exercise program.  The patient should continue antiplatelet therapy and aggressive treatment of the lipid abnormalities  Patient should undergo noninvasive studies as ordered. The patient will follow up with me after the studies.    2. Peripheral vascular disease ([place])  Recommend:  The patient has evidence of atherosclerosis of the lower extremities with claudication.  The patient does not voice lifestyle limiting changes at this point in time.  Noninvasive studies do not suggest clinically significant change.  No invasive studies, angiography or  surgery at this time The patient should continue walking and begin a more formal exercise program.  The patient should continue antiplatelet therapy and aggressive treatment of the lipid abnormalities  No changes in the patient's medications at this time  The patient should continue wearing graduated compression socks 10-15 mmHg strength to control the mild edema.   - VAS [place] ABI WITH/WO TBI; Future  3. Bilateral carotid artery disease, unspecified type ([place]) Recommend:  Given the patient's asymptomatic subcritical stenosis no further invasive testing or surgery at this time.  Continue antiplatelet therapy as prescribed Continue management of CAD, HTN and Hyperlipidemia Healthy heart diet,  encouraged exercise at least 4 times per week Follow up as arranged  4. Atherosclerosis of native coronary artery of native heart without angina pectoris Continue cardiac and antihypertensive medications as already ordered and reviewed, no changes at this time.  Continue statin as ordered and reviewed, no changes at this time  Nitrates PRN for chest pain   5. Essential hypertension Continue antihypertensive medications as already ordered, these medications have been reviewed and there are no changes at this time.   6. Type 2 diabetes mellitus with stage 3 chronic kidney disease, without long-term current use of insulin (HCC) Continue hypoglycemic medications as already ordered, these medications have been reviewed and there are no changes at this time.  Hgb A1C to be monitored as already arranged by primary service    Hortencia Pilar, MD  07/13/2019 9:02 AM

## 2019-07-14 LAB — COMPLETE METABOLIC PANEL WITH GFR
AG Ratio: 1.3 (calc) (ref 1.0–2.5)
ALT: 13 U/L (ref 9–46)
AST: 18 U/L (ref 10–35)
Albumin: 4.2 g/dL (ref 3.6–5.1)
Alkaline phosphatase (APISO): 54 U/L (ref 35–144)
BUN/Creatinine Ratio: 24 (calc) — ABNORMAL HIGH (ref 6–22)
BUN: 38 mg/dL — ABNORMAL HIGH (ref 7–25)
CO2: 31 mmol/L (ref 20–32)
Calcium: 9.8 mg/dL (ref 8.6–10.3)
Chloride: 102 mmol/L (ref 98–110)
Creat: 1.56 mg/dL — ABNORMAL HIGH (ref 0.70–1.11)
GFR, Est African American: 45 mL/min/{1.73_m2} — ABNORMAL LOW (ref >=60)
GFR, Est Non African American: 39 mL/min/{1.73_m2} — ABNORMAL LOW (ref >=60)
Globulin: 3.3 g/dL (calc) (ref 1.9–3.7)
Glucose, Bld: 141 mg/dL — ABNORMAL HIGH (ref 65–99)
Potassium: 4.8 mmol/L (ref 3.5–5.3)
Sodium: 141 mmol/L (ref 135–146)
Total Bilirubin: 0.7 mg/dL (ref 0.2–1.2)
Total Protein: 7.5 g/dL (ref 6.1–8.1)

## 2019-07-14 LAB — LIPID PANEL
Cholesterol: 148 mg/dL (ref <200)
HDL: 52 mg/dL (ref >=40)
LDL Cholesterol (Calc): 81 mg/dL (calc)
Non-HDL Cholesterol (Calc): 96 mg/dL (calc) (ref <130)
Total CHOL/HDL Ratio: 2.8 (calc) (ref <5.0)
Triglycerides: 69 mg/dL (ref <150)

## 2019-07-24 ENCOUNTER — Other Ambulatory Visit: Payer: Self-pay | Admitting: Family Medicine

## 2019-07-27 ENCOUNTER — Other Ambulatory Visit: Payer: Self-pay

## 2019-07-27 MED ORDER — ATORVASTATIN CALCIUM 40 MG PO TABS: 40.0000 mg | ORAL_TABLET | Freq: Every day | ORAL | 0 refills | Status: DC

## 2019-07-27 NOTE — Telephone Encounter (Signed)
Ins needs 90 day

## 2019-09-06 ENCOUNTER — Other Ambulatory Visit: Payer: Self-pay | Admitting: Family Medicine

## 2019-09-06 ENCOUNTER — Other Ambulatory Visit (INDEPENDENT_AMBULATORY_CARE_PROVIDER_SITE_OTHER): Payer: Self-pay | Admitting: Vascular Surgery

## 2019-09-18 ENCOUNTER — Encounter: Payer: Self-pay | Admitting: Family Medicine

## 2019-09-18 ENCOUNTER — Ambulatory Visit: Payer: Medicare Other | Admitting: Family Medicine

## 2019-09-18 ENCOUNTER — Other Ambulatory Visit: Payer: Self-pay

## 2019-09-18 VITALS — BP 142/76 | HR 76 | Temp 96.9°F | Resp 16 | Ht 66.0 in | Wt 134.1 lb

## 2019-09-18 DIAGNOSIS — E782 Mixed hyperlipidemia: Secondary | ICD-10-CM

## 2019-09-18 DIAGNOSIS — N183 Chronic kidney disease, stage 3 unspecified: Secondary | ICD-10-CM

## 2019-09-18 DIAGNOSIS — I700 Atherosclerosis of aorta: Secondary | ICD-10-CM | POA: Diagnosis not present

## 2019-09-18 DIAGNOSIS — E1122 Type 2 diabetes mellitus with diabetic chronic kidney disease: Secondary | ICD-10-CM

## 2019-09-18 DIAGNOSIS — I10 Essential (primary) hypertension: Secondary | ICD-10-CM | POA: Diagnosis not present

## 2019-09-18 LAB — POCT GLYCOSYLATED HEMOGLOBIN (HGB A1C): HbA1c, POC (controlled diabetic range): 6 % (ref 0.0–7.0)

## 2019-09-18 MED ORDER — AMLODIPINE BESYLATE 2.5 MG PO TABS: 2.5000 mg | ORAL_TABLET | Freq: Every day | ORAL | 1 refills | Status: DC

## 2019-09-18 MED ORDER — LINAGLIPTIN 5 MG PO TABS: ORAL_TABLET | ORAL | 1 refills | Status: DC

## 2019-09-18 MED ORDER — ATORVASTATIN CALCIUM 40 MG PO TABS: 40.0000 mg | ORAL_TABLET | Freq: Every day | ORAL | 1 refills | Status: DC

## 2019-09-18 NOTE — Progress Notes (Signed)
Name: Samuel Bryan.   MRN: [id number]    DOB: 03/29/1927   Date:09/18/2019       Progress Note  Chief Complaint  Patient presents with  . Medication Refill    3 month F/U-Would like 90 day supplies to [employer]  . Hypertension    Denies any symptoms  . Diabetes    Checks periodically Average 112  . Hyperlipidemia  . Chronic Kidney Disease     Subjective:   Samuel Bryan. is a 85 y.o. male, presents to clinic for routine follow up on the conditions listed above.  Pt new to me, he lives with his daughter who helps take care of him, another daughter is here today [name], there is a brother as well who has POA.   Some weight loss over the past couple months - 8 lbs per daughters report -she states he does not eat enough and has been working on very healthy diet does have a lot of vegetables salads or lean meats.  Reviewed weights and BMI over the past several months and last 4 months his weight has been stable but compared to earlier this year he is down a few pounds.  Did discuss supplementing with boost, Ensure or having nutrient rich snacks that are high in fat protein and calories.  For his age I explained that he can have some of the bad foods if that will help him keep his weight on and for him right now be more dangerous to lose weight and it would be to eat foods that might increase his cholesterol a little Wt Readings from Last 5 Encounters:  09/18/19 134 lb 1.6 oz (60.8 kg)  07/13/19 129 lb (58.5 kg)  06/17/19 134 lb 6.4 oz (61 kg)  05/21/19 134 lb 9.6 oz (61.1 kg)  01/29/19 142 lb (64.4 kg)   BMI Readings from Last 5 Encounters:  09/18/19 21.64 kg/m  07/13/19 20.82 kg/m  06/17/19 19.85 kg/m  05/21/19 21.73 kg/m  01/29/19 22.92 kg/m    Diabetes Mellitus Type II: Currently managing with tradjenta  Pt notes good med compliance Pt has no SE from meds. Fasting CBGs typically run 100-130.  No hypoglycemic episodes Denies: Polyuria, polydipsia,  polyphagia, vision changes, or neuropathy  A1C today 6.0 Recent pertinent labs: Lab Results  Component Value Date   HGBA1C 6.5 (H) 08/21/2018   HGBA1C 6.4 (H) 02/18/2018   HGBA1C 6.7 (H) 08/09/2017      Component Value Date/Time   NA 141 07/13/2019 0000   NA 137 04/09/2016 1008   NA 137 04/26/2012 1145   K 4.8 07/13/2019 0000   K 4.0 04/26/2012 1145   CL 102 07/13/2019 0000   CL 104 04/26/2012 1145   CO2 31 07/13/2019 0000   CO2 29 04/26/2012 1145   GLUCOSE 141 (H) 07/13/2019 0000   GLUCOSE 105 (H) 04/26/2012 1145   BUN 38 (H) 07/13/2019 0000   BUN 19 04/09/2016 1008   BUN 16 04/26/2012 1145   CREATININE 1.56 (H) 07/13/2019 0000   CALCIUM 9.8 07/13/2019 0000   CALCIUM 8.9 04/26/2012 1145   PROT 7.5 07/13/2019 0000   PROT 6.3 04/09/2016 1008   ALBUMIN 4.1 11/10/2018 1136   ALBUMIN 3.5 04/09/2016 1008   AST 18 07/13/2019 0000   ALT 13 07/13/2019 0000   ALKPHOS 81 11/10/2018 1136   BILITOT 0.7 07/13/2019 0000   BILITOT 0.3 04/09/2016 1008   GFRNONAA 39 (L) 07/13/2019 0000   GFRAA 45 (L) 07/13/2019 0000  UTD on DM foot exam and eye exam ACEI/ARB: Yes Statin: Yes  Hypertension:  Patient is on Norvasc 2.5 mg, Lasix, imdur, losartan.  Compliant with medications he does not have any side effects or concerns at this time.  He denies lightheadedness, hypotension, syncope. Blood pressure today is well controlled for his age. BP Readings from Last 3 Encounters:  09/18/19 (!) 142/76  07/13/19 (!) 178/69  06/17/19 (!) 146/60  Patient is new to me he and his daughter deny going to any specialist for blood pressure control or past cardiac history Pt denies CP, SOB, exertional sx, LE edema, palpitation, Ha's, visual disturbances  Hyperlipidemia: Current Medication Regimen:  lipitor Last Lipids: Lab Results  Component Value Date   CHOL 148 07/13/2019   HDL 52 07/13/2019   LDLCALC 81 07/13/2019   TRIG 69 07/13/2019   CHOLHDL 2.8 07/13/2019  He is compliant with his  medications 40 mg Lipitor daily not skipping doses, no side effects or concerns, careful with his diet attempted to discuss that for his age we can probably start to decrease medicines but will no longer make a significant difference to his quantity of life but may improve his quality of life.  Patient is very pleasant but difficult to talk to his daughter is not caregiver she was very difficult historian asking me to do talk to him about the same things over and over again she had several questions which I did encourage her to come back with the patient for separate visit to address new concerns or acute issues.  I did want to monitor his weight but they also did not want a return anytime soon to recheck his weight or his chronic conditions.     Patient Active Problem List   Diagnosis Date Noted  . Iron deficiency anemia 11/24/2018  . Aortic atherosclerosis ([place]) 07/28/2018  . Cholelithiasis 07/28/2018  . Pancreatic lesion 07/28/2018  . Chronic mesenteric ischemia ([place]) 07/28/2018  . Full code status 01/23/2018  . Memory loss 01/23/2018  . Carotid stenosis, asymptomatic, left 12/18/2017  . Degenerative arthritis of left shoulder region 08/19/2017  . Microalbuminuria 08/19/2017  . Type 2 diabetes mellitus ([place]) 08/13/2017  . Preventative health care 12/11/2016  . Abdominal mass 04/11/2016  . Abdominal mass, right upper quadrant 04/03/2016  . Bilateral carotid bruits 04/03/2016  . Facial injury 10/26/2015  . 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 Arteriosclerosis of coronary artery 10/11/2015  . H/O angina pectoris 10/11/2015  . Hyperlipidemia 09/13/2015  . Chronic kidney disease, stage III (moderate) ([place]) 09/13/2015  . RENAL ATHEROSCLEROSIS 03/16/2009  . Essential hypertension 03/15/2009  . Coronary atherosclerosis 03/15/2009  . Carotid arterial disease ([place]) 03/15/2009  . Peripheral vascular disease ([place]) 03/15/2009  . Anemia in chronic kidney disease (CKD) 03/15/2009    Past  Surgical History:  Procedure Laterality Date  . APPENDECTOMY  1970's  . COLONOSCOPY    . coronary artery stenting  1999   at [facility]   . ENDARTERECTOMY Left 12/18/2017   Procedure: ENDARTERECTOMY CAROTID WITH PATCH;  Surgeon: Delana Meyer Dolores Lory, MD;  Location: [facility] ORS;  Service: Vascular;  Laterality: Left;  . FACIAL LACERATION REPAIR Left 12/13/2015   performed at [facility] by Dr. Marcial Pacas  . renal angiography    . VISCERAL ANGIOGRAPHY N/A 08/19/2018   Procedure: VISCERAL ANGIOGRAPHY;  Surgeon: Katha Cabal, MD;  Location: [facility];  Service: Cardiovascular;  Laterality: N/A;    Family History  Problem Relation Age of Onset  . Diabetes  Mother   . Heart disease Mother   . Hypertension Brother   . Hyperlipidemia Brother   . Diabetes Brother   . Hypertension Son   . Hypertension Son   . Birth defects Neg Hx   . Stroke Neg Hx     Social History   Socioeconomic History  . Marital status: Widowed    Spouse name: Not on file  . Number of children: 5  . Years of education: 10  . Highest education level: Not on file  Occupational History  . Occupation: retired  Scientific laboratory technician  . Financial resource strain: Not hard at all  . Food insecurity    Worry: Never true    Inability: Never true  . Transportation needs    Medical: No    Non-medical: No  Tobacco Use  . Smoking status: Former Smoker    Types: Cigarettes    Quit date: 10/29/1970    Years since quitting: 48.9  . Smokeless tobacco: Never Used  Substance and Sexual Activity  . Alcohol use: No    Alcohol/week: 0.0 standard drinks  . Drug use: No  . Sexual activity: Not Currently  Lifestyle  . Physical activity    Days per week: 5 days    Minutes per session: 60 min  . Stress: Not at all  Relationships  . Social connections    Talks on phone: More than three times a week    Gets together: Three times a week    Attends religious service: More than 4 times per year    Active member of club or  organization: No    Attends meetings of clubs or organizations: Never    Relationship status: Widowed  . Intimate partner violence    Fear of current or ex partner: No    Emotionally abused: No    Physically abused: No    Forced sexual activity: No  Other Topics Concern  . Not on file  Social History Narrative   widowed, all children are healthy.    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      Current Outpatient Medications:  .  amLODipine (NORVASC) 2.5 MG tablet, Take 1 tablet (2.5 mg total) by mouth daily., Disp: 90 tablet, Rfl: 0 .  aspirin EC 81 MG tablet, Take 81 mg by mouth daily. , Disp: , Rfl:  .  atorvastatin (LIPITOR) 40 MG tablet, Take 1 tablet (40 mg total) by mouth at bedtime., Disp: 90 tablet, Rfl: 0 .  cholecalciferol (VITAMIN D) 1000 units tablet, Take 1,000 Units by mouth every other day. , Disp: , Rfl:  .  clopidogrel (PLAVIX) 75 MG tablet, TAKE 1 TABLET DAILY., Disp: 90 tablet, Rfl: 0 .  docusate sodium (COLACE) 100 MG capsule, Take 100 mg by mouth daily as needed for mild constipation., Disp: , Rfl:  .  ferrous sulfate 325 (65 FE) MG EC tablet, Take 325 mg by mouth 3 (three) times daily with meals., Disp: , Rfl:  .  furosemide (LASIX) 40 MG tablet, Take 40 mg by mouth daily., Disp: , Rfl:  .  isosorbide mononitrate (IMDUR) 30 MG 24 hr tablet, Take 1 tablet (30 mg total) by mouth daily., Disp: , Rfl:  .  latanoprost (XALATAN) 0.005 % ophthalmic solution, Place 1 drop into both eyes at bedtime., Disp: , Rfl:  .  losartan (COZAAR) 100 MG tablet, Take 100 mg by mouth daily., Disp: , Rfl:  .  Multiple Vitamin (  MULTIVITAMIN) tablet, Take 1 tablet by mouth daily., Disp: , Rfl:  .  nitroGLYCERIN (NITROSTAT) 0.4 MG SL tablet, Place 1 tablet (0.4 mg total) under the tongue every 5 (five) minutes as needed for chest pain. Max of 3 pills; call 911, Disp: 25 tablet, Rfl: 3 .  Omega-3 Fatty Acids (FISH OIL) 1000 MG CAPS, Take  1,000 mg by mouth daily. , Disp: , Rfl:  .  timolol (BETIMOL) 0.5 % ophthalmic solution, Place 1 drop into both eyes 2 (two) times daily. (Patient taking differently: Place 1 drop into both eyes at bedtime. ), Disp: 10 mL, Rfl: 12 .  timolol (TIMOPTIC) 0.5 % ophthalmic solution, , Disp: , Rfl:  .  TRADJENTA 5 MG TABS tablet, Take 1 tablet (5 mg total) by mouth daily. For diabetes, Disp: 90 tablet, Rfl: 0 .  vitamin B-12 (CYANOCOBALAMIN) 1000 MCG tablet, Take 1,000 mcg by mouth daily., Disp: , Rfl:   No Known Allergies  I personally reviewed active problem list, medication list, allergies, family history, social history, health maintenance, notes from last encounter, lab results with the patient/caregiver today.  Review of Systems  Constitutional: Negative.   HENT: Negative.   Eyes: Negative.   Respiratory: Negative.   Cardiovascular: Negative.   Gastrointestinal: Negative.   Endocrine: Negative.   Genitourinary: Negative.   Musculoskeletal: Negative.   Skin: Negative.   Allergic/Immunologic: Negative.   Neurological: Negative.   Hematological: Negative.   Psychiatric/Behavioral: Negative.   All other systems reviewed and are negative.    Objective:    Vitals:   09/18/19 0901  BP: (!) 142/76  Pulse: 76  Resp: 16  Temp: (!) 96.9 F (36.1 C)  TempSrc: Temporal  SpO2: 95%  Weight: 134 lb 1.6 oz (60.8 kg)  Height: 5\' 6"  (1.676 m)    Body mass index is 21.64 kg/m.  Physical Exam Vitals signs and nursing note reviewed.  Constitutional:      General: He is not in acute distress.    Appearance: Normal appearance. He is well-developed. He is not ill-appearing, toxic-appearing or diaphoretic.     Comments: Slightly thin appearing elderly male, well-appearing, alert, no acute distress hard of hearing  HENT:     Head: Normocephalic and atraumatic.     Nose: Nose normal.     Mouth/Throat:     Mouth: Mucous membranes are moist.     Pharynx: Oropharynx is clear.  Eyes:      General:        Right eye: No discharge.        Left eye: No discharge.     Conjunctiva/sclera: Conjunctivae normal.  Neck:     Trachea: No tracheal deviation.  Cardiovascular:     Rate and Rhythm: Normal rate and regular rhythm.     Pulses: Normal pulses.     Heart sounds: Normal heart sounds.  Pulmonary:     Effort: Pulmonary effort is normal. No respiratory distress.     Breath sounds: Normal breath sounds. No stridor. No wheezing or rhonchi.  Abdominal:     General: Bowel sounds are normal. There is no distension.     Tenderness: There is no abdominal tenderness. There is no right CVA tenderness or left CVA tenderness.  Musculoskeletal: Normal range of motion.     Right lower leg: No edema.     Left lower leg: No edema.  Skin:    General: Skin is warm and dry.     Capillary Refill: Capillary refill takes less than 2  seconds.     Coloration: Skin is not jaundiced or pale.     Findings: No rash.  Neurological:     Mental Status: He is alert. Mental status is at baseline.     Motor: No abnormal muscle tone.     Coordination: Coordination normal.  Psychiatric:        Mood and Affect: Mood normal.        Behavior: Behavior normal.       Recent Results (from the past 2160 hour(s))  COMPLETE METABOLIC PANEL WITH GFR     Status: Abnormal   Collection Time: 07/13/19 12:00 AM  Result Value Ref Range   Glucose, Bld 141 (H) 65 - 99 mg/dL    Comment: .            Fasting reference interval . For someone without known diabetes, a glucose value >125 mg/dL indicates that they may have diabetes and this should be confirmed with a follow-up test. .    BUN 38 (H) 7 - 25 mg/dL   Creat 1.56 (H) 0.70 - 1.11 mg/dL    Comment: For patients >39 years of age, the reference limit for Creatinine is approximately 13% higher for people identified as African-American. .    GFR, Est Non African American 39 (L) > OR = 60 mL/min/1.51m2   GFR, Est African American 45 (L) > OR = 60  mL/min/1.26m2   BUN/Creatinine Ratio 24 (H) 6 - 22 (calc)   Sodium 141 135 - 146 mmol/L   Potassium 4.8 3.5 - 5.3 mmol/L   Chloride 102 98 - 110 mmol/L   CO2 31 20 - 32 mmol/L   Calcium 9.8 8.6 - 10.3 mg/dL   Total Protein 7.5 6.1 - 8.1 g/dL   Albumin 4.2 3.6 - 5.1 g/dL   Globulin 3.3 1.9 - 3.7 g/dL (calc)   AG Ratio 1.3 1.0 - 2.5 (calc)   Total Bilirubin 0.7 0.2 - 1.2 mg/dL   Alkaline phosphatase (APISO) 54 35 - 144 U/L   AST 18 10 - 35 U/L   ALT 13 9 - 46 U/L  Lipid Profile     Status: None   Collection Time: 07/13/19 12:00 AM  Result Value Ref Range   Cholesterol 148 <200 mg/dL   HDL 52 > OR = 40 mg/dL   Triglycerides 69 <150 mg/dL   LDL Cholesterol (Calc) 81 mg/dL (calc)    Comment: Reference range: <100 . Desirable range <100 mg/dL for primary prevention;   <70 mg/dL for patients with CHD or diabetic patients  with > or = 2 CHD risk factors. Marland Kitchen LDL-C is now calculated using the Martin-Hopkins  calculation, which is a validated novel method providing  better accuracy than the Friedewald equation in the  estimation of LDL-C.  Cresenciano Genre et al. Annamaria Helling. [id number]): 2061-2068  (http://education.[facility].com/faq/FAQ164)    Total CHOL/HDL Ratio 2.8 <5.0 (calc)   Non-HDL Cholesterol (Calc) 96 <130 mg/dL (calc)    Comment: For patients with diabetes plus 1 major ASCVD risk  factor, treating to a non-HDL-C goal of <100 mg/dL  (LDL-C of <70 mg/dL) is considered a therapeutic  option.     Diabetic Foot Exam:  Done April 2020 Diabetic Foot Exam - Simple   No data filed       PHQ2/9: Depression screen [facility] 2/9 09/18/2019 06/17/2019 03/06/2019 01/29/2019 08/21/2018  Decreased Interest 0 0 0 0 0  Down, Depressed, Hopeless 0 0 0 0 0  PHQ - 2 Score 0 0 0  0 0  Altered sleeping 0 0 0 - -  Tired, decreased energy 0 0 0 - -  Change in appetite 0 0 0 - -  Feeling bad or failure about yourself  0 0 0 - -  Trouble concentrating 0 0 0 - -  Moving slowly or fidgety/restless 0 0  0 - -  Suicidal thoughts 0 0 0 - -  PHQ-9 Score 0 0 0 - -  Difficult doing work/chores Not difficult at all Not difficult at all Not difficult at all - -    phq 9 is negative Reviewed today  Fall Risk: Fall Risk  09/18/2019 06/17/2019 03/06/2019 01/29/2019 08/21/2018  Falls in the past year? 0 0 0 0 No  Number falls in past yr: 0 0 0 0 -  Injury with Fall? 0 0 0 0 -  Follow up - - - Falls prevention discussed -    Functional Status Survey: Is the patient deaf or have difficulty hearing?: Yes Does the patient have difficulty seeing, even when wearing glasses/contacts?: No Does the patient have difficulty concentrating, remembering, or making decisions?: No Does the patient have difficulty walking or climbing stairs?: No Does the patient have difficulty dressing or bathing?: No Does the patient have difficulty doing errands alone such as visiting a doctor's office or shopping?: Yes    Assessment & Plan:     1. Type 2 diabetes mellitus with stage 3 chronic kidney disease, without long-term current use of insulin, unspecified whether stage 3a or 3b CKD (HCC) Hemoglobin 6.0 has been very well controlled and even lower today than his last labs could consider discontinuing his medication as long as his A1c stays under 7, his blood pressure readings are in range no hypoglycemia no side effects - linagliptin (TRADJENTA) 5 MG TABS tablet; Take 1 tablet (5 mg total) by mouth daily. For diabetes  Dispense: 90 tablet; Refill: 1 - POCT HgB A1C  2. Essential hypertension Blood pressure well controlled for his age recheck kidney function - amLODipine (NORVASC) 2.5 MG tablet; Take 1 tablet (2.5 mg total) by mouth daily.  Dispense: 90 tablet; Refill: 1   3. Mixed hyperlipidemia Compliant with Lipitor and eating very healthy diet, did attempt to discuss with him that may be able to stop statin medication and avoid side effects - atorvastatin (LIPITOR) 40 MG tablet; Take 1 tablet (40 mg total) by  mouth at bedtime.  Dispense: 90 tablet; Refill: 1  4. Aortic atherosclerosis (HCC) - atorvastatin (LIPITOR) 40 MG tablet; Take 1 tablet (40 mg total) by mouth at bedtime.  Dispense: 90 tablet; Refill: 1  Daughter concerned about recent weight loss, his weight has been stable for the past 6 months, did discuss making sure he has enough protein and calories in his diet, but asked him to come back in the next 1 to 2 months so that I can monitor his weight but they refused to do so they would rather come back in 6 months, patient's daughter who is here was not his caregiver but she said that they will watch his weight at home and call if they need the office visit if he has any further weight loss   Return in about 6 months (around 03/17/2020) for Routine follow-up DM, AND 2 month weight recheck virtual.   Delsa Grana, PA-C 09/18/19 9:16 AM

## 2019-09-29 ENCOUNTER — Encounter: Payer: Self-pay | Admitting: Family Medicine

## 2019-10-01 ENCOUNTER — Ambulatory Visit: Payer: Medicare Other | Admitting: Podiatry

## 2019-10-08 ENCOUNTER — Ambulatory Visit: Payer: Medicare Other | Admitting: Podiatry

## 2019-11-19 ENCOUNTER — Other Ambulatory Visit: Payer: Self-pay

## 2019-11-19 ENCOUNTER — Inpatient Hospital Stay: Payer: Medicare PPO | Attending: Oncology

## 2019-11-19 DIAGNOSIS — N1831 Chronic kidney disease, stage 3a: Secondary | ICD-10-CM | POA: Insufficient documentation

## 2019-11-19 DIAGNOSIS — D631 Anemia in chronic kidney disease: Secondary | ICD-10-CM | POA: Diagnosis present

## 2019-11-19 LAB — IRON AND TIBC
Iron: 56 ug/dL (ref 45–182)
Saturation Ratios: 21 % (ref 17.9–39.5)
TIBC: 270 ug/dL (ref 250–450)
UIBC: 214 ug/dL

## 2019-11-19 LAB — CBC WITH DIFFERENTIAL/PLATELET
Abs Immature Granulocytes: 0.01 10*3/uL (ref 0.00–0.07)
Basophils Absolute: 0 10*3/uL (ref 0.0–0.1)
Basophils Relative: 0 %
Eosinophils Absolute: 0.1 10*3/uL (ref 0.0–0.5)
Eosinophils Relative: 2 %
HCT: 34.4 % — ABNORMAL LOW (ref 39.0–52.0)
Hemoglobin: 10.3 g/dL — ABNORMAL LOW (ref 13.0–17.0)
Immature Granulocytes: 0 %
Lymphocytes Relative: 22 %
Lymphs Abs: 0.7 10*3/uL (ref 0.7–4.0)
MCH: 31.5 pg (ref 26.0–34.0)
MCHC: 29.9 g/dL — ABNORMAL LOW (ref 30.0–36.0)
MCV: 105.2 fL — ABNORMAL HIGH (ref 80.0–100.0)
Monocytes Absolute: 0.7 10*3/uL (ref 0.1–1.0)
Monocytes Relative: 22 %
Neutro Abs: 1.8 10*3/uL (ref 1.7–7.7)
Neutrophils Relative %: 54 %
Platelets: 178 10*3/uL (ref 150–400)
RBC: 3.27 MIL/uL — ABNORMAL LOW (ref 4.22–5.81)
RDW: 13.1 % (ref 11.5–15.5)
WBC: 3.4 10*3/uL — ABNORMAL LOW (ref 4.0–10.5)
nRBC: 0 % (ref 0.0–0.2)

## 2019-11-19 LAB — VITAMIN B12: Vitamin B-12: 1508 pg/mL — ABNORMAL HIGH (ref 180–914)

## 2019-11-19 LAB — FERRITIN: Ferritin: 62 ng/mL (ref 24–336)

## 2019-11-23 ENCOUNTER — Encounter: Payer: Self-pay | Admitting: Oncology

## 2019-11-23 ENCOUNTER — Other Ambulatory Visit: Payer: Self-pay

## 2019-11-23 ENCOUNTER — Inpatient Hospital Stay (HOSPITAL_BASED_OUTPATIENT_CLINIC_OR_DEPARTMENT_OTHER): Payer: Medicare PPO | Admitting: Oncology

## 2019-11-23 ENCOUNTER — Inpatient Hospital Stay: Payer: Medicare PPO

## 2019-11-23 DIAGNOSIS — N1831 Chronic kidney disease, stage 3a: Secondary | ICD-10-CM

## 2019-11-23 DIAGNOSIS — D631 Anemia in chronic kidney disease: Secondary | ICD-10-CM | POA: Diagnosis not present

## 2019-11-23 NOTE — Progress Notes (Signed)
Patient stated that he had been doing well with no complaints. 

## 2019-11-23 NOTE — Progress Notes (Signed)
I connected with Samuel Bryan on 11/23/19 at 10:30 AM EST by video enabled telemedicine visit and verified that I am speaking with the correct person using two identifiers.   I discussed the limitations, risks, security and privacy concerns of performing an evaluation and management service by telemedicine and the availability of in-person appointments. I also discussed with the patient that there may be a patient responsible charge related to this service. The patient expressed understanding and agreed to proceed.  Other persons participating in the visit and their role in the encounter:  Patients daughter  Patient's location:  home Provider's location:  work  Risk analyst Complaint:  Routine f/u of anemia and leukopenia  History of present illness: Patient is a 86 year old African-American male with a past medical history significant for CKD stage III, peripheral vascular disease, hypertension hyperlipidemia among other medical problems.He has been referred to [place] for evaluation of leukopenia. Recent CBC from 10/28/2018 showed white count of 3.7, H&H of 10.4/31.6 and a platelet count of 250. Looking back at his prior white count patient has had a few low values of 3.41 dating back to 2013 and his white count waxes and wanes. His hemoglobin of late has been around 10 likely secondary to his chronic kidney disease.    Interval history . Patient and daughter report he is doing well overall. No recent hospitalizations   Review of Systems  Constitutional: Positive for malaise/fatigue. Negative for chills, fever and weight loss.  HENT: Negative for congestion, ear discharge and nosebleeds.   Eyes: Negative for blurred vision.  Respiratory: Negative for cough, hemoptysis, sputum production, shortness of breath and wheezing.   Cardiovascular: Negative for chest pain, palpitations, orthopnea and claudication.  Gastrointestinal: Negative for abdominal pain, blood in stool, constipation, diarrhea,  heartburn, melena, nausea and vomiting.  Genitourinary: Negative for dysuria, flank pain, frequency, hematuria and urgency.  Musculoskeletal: Negative for back pain, joint pain and myalgias.  Skin: Negative for rash.  Neurological: Negative for dizziness, tingling, focal weakness, seizures, weakness and headaches.  Endo/Heme/Allergies: Does not bruise/bleed easily.  Psychiatric/Behavioral: Negative for depression and suicidal ideas. The patient does not have insomnia.     No Known Allergies  Past Medical History:  Diagnosis Date  . Anemia   . Atherosclerosis of renal artery ([place])   . Essential hypertension, benign   . Occlusion and stenosis of carotid artery without mention of cerebral infarction    moderate left ICA stenosis  . Peripheral vascular disease, unspecified ([place])    mild lifestyle limiting claudication  . Pure hypercholesterolemia   . Renal insufficiency   . Type 2 diabetes mellitus ([place])     Past Surgical History:  Procedure Laterality Date  . APPENDECTOMY  1970's  . COLONOSCOPY    . coronary artery stenting  1999   at [facility]   . ENDARTERECTOMY Left 12/18/2017   Procedure: ENDARTERECTOMY CAROTID WITH PATCH;  Surgeon: Delana Meyer Dolores Lory, MD;  Location: [facility] ORS;  Service: Vascular;  Laterality: Left;  . FACIAL LACERATION REPAIR Left 12/13/2015   performed at [facility] by Dr. Marcial Pacas  . renal angiography    . VISCERAL ANGIOGRAPHY N/A 08/19/2018   Procedure: VISCERAL ANGIOGRAPHY;  Surgeon: Katha Cabal, MD;  Location: [place] [facility];  Service: Cardiovascular;  Laterality: N/A;    Social History   Socioeconomic History  . Marital status: Widowed    Spouse name: Not on file  . Number of children: 5  . Years of education: 10  . Highest education level: Not  on file  Occupational History  . Occupation: retired  Tobacco Use  . Smoking status: Former Smoker    Types: Cigarettes    Quit date: 10/29/1970    Years since quitting: 49.1  . Smokeless tobacco:  Never Used  Substance and Sexual Activity  . Alcohol use: No    Alcohol/week: 0.0 standard drinks  . Drug use: No  . Sexual activity: Not Currently  Other Topics Concern  . Not on file  Social History Narrative   widowed, all children are healthy.    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    Social Determinants of Health   Financial Resource Strain: Low Risk   . Difficulty of Paying Living Expenses: Not hard at all  Food Insecurity: No Food Insecurity  . Worried About Charity fundraiser in the Last Year: Never true  . Ran Out of Food in the Last Year: Never true  Transportation Needs: No Transportation Needs  . Lack of Transportation (Medical): No  . Lack of Transportation (Non-Medical): No  Physical Activity: Sufficiently Active  . Days of Exercise per Week: 5 days  . Minutes of Exercise per Session: 60 min  Stress: No Stress Concern Present  . Feeling of Stress : Not at all  Social Connections: Somewhat Isolated  . Frequency of Communication with Friends and Family: More than three times a week  . Frequency of Social Gatherings with Friends and Family: Three times a week  . Attends Religious Services: More than 4 times per year  . Active Member of Clubs or Organizations: No  . Attends Archivist Meetings: Never  . Marital Status: Widowed  Intimate Partner Violence: Not At Risk  . Fear of Current or Ex-Partner: No  . Emotionally Abused: No  . Physically Abused: No  . Sexually Abused: No    Family History  Problem Relation Age of Onset  . Diabetes Mother   . Heart disease Mother   . Hypertension Brother   . Hyperlipidemia Brother   . Diabetes Brother   . Hypertension Son   . Hypertension Son   . Birth defects Neg Hx   . Stroke Neg Hx      Current Outpatient Medications:  .  amLODipine (NORVASC) 2.5 MG tablet, Take 1 tablet (2.5 mg total) by mouth daily., Disp: 90 tablet, Rfl: 1 .  aspirin EC 81 MG  tablet, Take 81 mg by mouth daily. , Disp: , Rfl:  .  atorvastatin (LIPITOR) 40 MG tablet, Take 1 tablet (40 mg total) by mouth at bedtime., Disp: 90 tablet, Rfl: 1 .  cholecalciferol (VITAMIN D) 1000 units tablet, Take 1,000 Units by mouth every other day. , Disp: , Rfl:  .  clopidogrel (PLAVIX) 75 MG tablet, TAKE 1 TABLET DAILY., Disp: 90 tablet, Rfl: 0 .  docusate sodium (COLACE) 100 MG capsule, Take 100 mg by mouth daily as needed for mild constipation., Disp: , Rfl:  .  ferrous sulfate 325 (65 FE) MG EC tablet, Take 325 mg by mouth daily. , Disp: , Rfl:  .  furosemide (LASIX) 40 MG tablet, Take 40 mg by mouth every other day. , Disp: , Rfl:  .  isosorbide mononitrate (IMDUR) 30 MG 24 hr tablet, Take 1 tablet (30 mg total) by mouth daily., Disp: , Rfl:  .  latanoprost (XALATAN) 0.005 % ophthalmic solution, Place 1 drop into both eyes at bedtime., Disp: , Rfl:  .  linagliptin (TRADJENTA)  5 MG TABS tablet, Take 1 tablet (5 mg total) by mouth daily. For diabetes, Disp: 90 tablet, Rfl: 1 .  losartan (COZAAR) 100 MG tablet, Take 100 mg by mouth daily., Disp: , Rfl:  .  Multiple Vitamin (MULTIVITAMIN) tablet, Take 1 tablet by mouth daily., Disp: , Rfl:  .  Omega-3 Fatty Acids (FISH OIL) 1000 MG CAPS, Take 1,000 mg by mouth daily. , Disp: , Rfl:  .  timolol (BETIMOL) 0.5 % ophthalmic solution, Place 1 drop into both eyes 2 (two) times daily. (Patient taking differently: Place 1 drop into both eyes at bedtime. ), Disp: 10 mL, Rfl: 12 .  timolol (TIMOPTIC) 0.5 % ophthalmic solution, , Disp: , Rfl:  .  vitamin B-12 (CYANOCOBALAMIN) 1000 MCG tablet, Take 1,000 mcg by mouth daily., Disp: , Rfl:  .  nitroGLYCERIN (NITROSTAT) 0.4 MG SL tablet, Place 1 tablet (0.4 mg total) under the tongue every 5 (five) minutes as needed for chest pain. Max of 3 pills; call 911 (Patient not taking: Reported on 11/23/2019), Disp: 25 tablet, Rfl: 3  No results found.  No images are attached to the encounter.   CMP Latest  Ref Rng & Units 07/13/2019  Glucose 65 - 99 mg/dL 141(H)  BUN 7 - 25 mg/dL 38(H)  Creatinine 0.70 - 1.11 mg/dL 1.56(H)  Sodium 135 - 146 mmol/L 141  Potassium 3.5 - 5.3 mmol/L 4.8  Chloride 98 - 110 mmol/L 102  CO2 20 - 32 mmol/L 31  Calcium 8.6 - 10.3 mg/dL 9.8  Total Protein 6.1 - 8.1 g/dL 7.5  Total Bilirubin 0.2 - 1.2 mg/dL 0.7  Alkaline Phos 38 - 126 U/L -  AST 10 - 35 U/L 18  ALT 9 - 46 U/L 13   CBC Latest Ref Rng & Units 11/19/2019  WBC 4.0 - 10.5 K/uL 3.4(L)  Hemoglobin 13.0 - 17.0 g/dL 10.3(L)  Hematocrit 39.0 - 52.0 % 34.4(L)  Platelets 150 - 400 K/uL 178     Observation/objective:appears in no acute distress over video visit today. Breathing is non labored.   Assessment and plan: patient is a 86 year old male with leukopenia and anemia of ckd here for routine f/u  1. Leukopenia- overall stable. anc waxes and wanes between 1.8-2.4. continue to monitor  2. Anemia- he has developed macrocytosis over last 7 months. Also has ckd. Currently anemia stable around 10 over last 1 year. I will see him in 6 months with labs cbc, ferritin and iron studies, b12 folate and tsh prior  Follow-up instructions:  I discussed the assessment and treatment plan with the patient. The patient was provided an opportunity to ask questions and all were answered. The patient agreed with the plan and demonstrated an understanding of the instructions.   The patient was advised to call back or seek an in-person evaluation if the symptoms worsen or if the condition fails to improve as anticipated.    Visit Diagnosis: 1. Anemia in stage 3a chronic kidney disease     Dr. Randa Evens, MD, MPH [facility] at [facility] Tel- [phone] 11/23/2019 4:48 PM

## 2020-01-06 DIAGNOSIS — I129 Hypertensive chronic kidney disease with stage 1 through stage 4 chronic kidney disease, or unspecified chronic kidney disease: Secondary | ICD-10-CM | POA: Insufficient documentation

## 2020-01-07 ENCOUNTER — Encounter (INDEPENDENT_AMBULATORY_CARE_PROVIDER_SITE_OTHER): Payer: Self-pay | Admitting: Vascular Surgery

## 2020-01-07 ENCOUNTER — Other Ambulatory Visit: Payer: Self-pay

## 2020-01-07 ENCOUNTER — Ambulatory Visit (INDEPENDENT_AMBULATORY_CARE_PROVIDER_SITE_OTHER): Payer: Medicare PPO

## 2020-01-07 ENCOUNTER — Ambulatory Visit (INDEPENDENT_AMBULATORY_CARE_PROVIDER_SITE_OTHER): Payer: Medicare Other | Admitting: Vascular Surgery

## 2020-01-07 VITALS — BP 179/69 | HR 65 | Ht 66.0 in | Wt 140.0 lb

## 2020-01-07 DIAGNOSIS — K551 Chronic vascular disorders of intestine: Secondary | ICD-10-CM

## 2020-01-07 DIAGNOSIS — I739 Peripheral vascular disease, unspecified: Secondary | ICD-10-CM

## 2020-01-07 DIAGNOSIS — N183 Chronic kidney disease, stage 3 unspecified: Secondary | ICD-10-CM

## 2020-01-07 DIAGNOSIS — I2510 Atherosclerotic heart disease of native coronary artery without angina pectoris: Secondary | ICD-10-CM

## 2020-01-07 DIAGNOSIS — E1122 Type 2 diabetes mellitus with diabetic chronic kidney disease: Secondary | ICD-10-CM

## 2020-01-07 DIAGNOSIS — I10 Essential (primary) hypertension: Secondary | ICD-10-CM | POA: Diagnosis not present

## 2020-01-07 DIAGNOSIS — I6522 Occlusion and stenosis of left carotid artery: Secondary | ICD-10-CM | POA: Diagnosis not present

## 2020-01-07 NOTE — Progress Notes (Signed)
MRN : [id number]  Samuel Bryan. is a 86 y.o. ([birth year]) male who presents with chief complaint of No chief complaint on file. Marland Kitchen  History of Present Illness:   The patient returns to the office for followup and review status post angiogram with intervention on 08/20/2019.  Procedure(s) Performed: 1. Abdominal aortogram 2. Bilateral iliofemoralrunoff 3. Percutaneous transluminal angioplasty and stent placement right common iliac artery; "kissing balloon" technique 4. Percutaneous transluminal and plasty and stent placement left common iliac artery; "kissing balloon" technique 5. Ultrasound guided access bilateral common femoral arteries 6.Introduction catheter into celiac artery 7.Percutaneous transluminal angioplasty and stent placement celiac artery 8.StarClose closure device bilateral common femoral arteries  The patient notes improvement in the lower extremity symptoms. No interval shortening of the patient's claudication distance or rest pain symptoms. Previous wounds have now healed.  No new ulcers or wounds have occurred since the last visit.  There have been no significant changes to the patient's overall health care.  He is s/p left CEA 12/18/2017.  The patient denies amaurosis fugax or recent TIA symptoms. There are no recent neurological changes noted. The patient denies history of DVT, PE or superficial thrombophlebitis. The patient denies recent episodes of angina or shortness of breath.   ABI's Rt=[place] and Lt=[place]  (previous Rt=1.00 and Lt=0.50) Previous duplex US of the mesenteric arterial system shows patent celiac stent  No outpatient medications have been marked as taking for the 01/07/20 encounter (Appointment) with Delana Meyer, Dolores Lory, MD.    Past Medical History:  Diagnosis Date  . Anemia   . Atherosclerosis of renal artery ([place])   . Essential  hypertension, benign   . Occlusion and stenosis of carotid artery without mention of cerebral infarction    moderate left ICA stenosis  . Peripheral vascular disease, unspecified ([place])    mild lifestyle limiting claudication  . Pure hypercholesterolemia   . Renal insufficiency   . Type 2 diabetes mellitus ([place])     Past Surgical History:  Procedure Laterality Date  . APPENDECTOMY  1970's  . COLONOSCOPY    . coronary artery stenting  1999   at [facility]   . ENDARTERECTOMY Left 12/18/2017   Procedure: ENDARTERECTOMY CAROTID WITH PATCH;  Surgeon: Delana Meyer Dolores Lory, MD;  Location: [facility] ORS;  Service: Vascular;  Laterality: Left;  . FACIAL LACERATION REPAIR Left 12/13/2015   performed at [facility] by Dr. Marcial Pacas  . renal angiography    . VISCERAL ANGIOGRAPHY N/A 08/19/2018   Procedure: VISCERAL ANGIOGRAPHY;  Surgeon: Katha Cabal, MD;  Location: [place] [facility];  Service: Cardiovascular;  Laterality: N/A;    Social History Social History   Tobacco Use  . Smoking status: Former Smoker    Types: Cigarettes    Quit date: 10/29/1970    Years since quitting: 49.2  . Smokeless tobacco: Never Used  Substance Use Topics  . Alcohol use: No    Alcohol/week: 0.0 standard drinks  . Drug use: No    Family History Family History  Problem Relation Age of Onset  . Diabetes Mother   . Heart disease Mother   . Hypertension Brother   . Hyperlipidemia Brother   . Diabetes Brother   . Hypertension Son   . Hypertension Son   . Birth defects Neg Hx   . Stroke Neg Hx     No Known Allergies   REVIEW OF SYSTEMS (Negative unless checked)  Constitutional: [] Weight loss  [] Fever  [] Chills Cardiac: [] Chest pain   [] Chest pressure   []   Palpitations   [] Shortness of breath when laying flat   [] Shortness of breath with exertion. Vascular:  [x] Pain in legs with walking   [] Pain in legs at rest  [] History of DVT   [] Phlebitis   [] Swelling in legs   [] Varicose veins   [] Non-healing  ulcers Pulmonary:   [] Uses home oxygen   [] Productive cough   [] Hemoptysis   [] Wheeze  [x] COPD   [] Asthma Neurologic:  [] Dizziness   [] Seizures   [] History of stroke   [] History of TIA  [] Aphasia   [] Vissual changes   [] Weakness or numbness in arm   [] Weakness or numbness in leg Musculoskeletal:   [] Joint swelling   [x] Joint pain   [] Low back pain Hematologic:  [] Easy bruising  [] Easy bleeding   [] Hypercoagulable state   [] Anemic Gastrointestinal:  [] Diarrhea   [] Vomiting  [] Gastroesophageal reflux/heartburn   [] Difficulty swallowing. Genitourinary:  [] Chronic kidney disease   [] Difficult urination  [] Frequent urination   [] Blood in urine Skin:  [] Rashes   [] Ulcers  Psychological:  [] History of anxiety   []  History of major depression.  Physical Examination  There were no vitals filed for this visit. There is no height or weight on file to calculate BMI. Gen: WD/WN, NAD Head: [place]/AT, No temporalis wasting.  Ear/Nose/Throat: Hearing grossly intact, nares w/o erythema or drainage Eyes: PER, EOMI, sclera nonicteric.  Neck: Supple, no large masses.   Pulmonary:  Good air movement, no audible wheezing bilaterally, no use of accessory muscles.  Cardiac: RRR, no JVD Vascular:  Vessel Right Left  Radial Palpable Palpable  PT Not Palpable Not Palpable  DP Not Palpable Not Palpable  Gastrointestinal: Non-distended. No guarding/no peritoneal signs.  Musculoskeletal: M/S 5/5 throughout.  No deformity or atrophy.  Neurologic: CN 2-12 intact. Symmetrical.  Speech is fluent. Motor exam as listed above. Psychiatric: Judgment intact, Mood & affect appropriate for pt's clinical situation. Dermatologic: No rashes or ulcers noted.  No changes consistent with cellulitis.   CBC Lab Results  Component Value Date   WBC 3.4 (L) 11/19/2019   HGB 10.3 (L) 11/19/2019   HCT 34.4 (L) 11/19/2019   MCV 105.2 (H) 11/19/2019   PLT 178 11/19/2019    BMET    Component Value Date/Time   NA 141 07/13/2019  0000   NA 137 04/09/2016 1008   NA 137 04/26/2012 1145   K 4.8 07/13/2019 0000   K 4.0 04/26/2012 1145   CL 102 07/13/2019 0000   CL 104 04/26/2012 1145   CO2 31 07/13/2019 0000   CO2 29 04/26/2012 1145   GLUCOSE 141 (H) 07/13/2019 0000   GLUCOSE 105 (H) 04/26/2012 1145   BUN 38 (H) 07/13/2019 0000   BUN 19 04/09/2016 1008   BUN 16 04/26/2012 1145   CREATININE 1.56 (H) 07/13/2019 0000   CALCIUM 9.8 07/13/2019 0000   CALCIUM 8.9 04/26/2012 1145   GFRNONAA 39 (L) 07/13/2019 0000   GFRAA 45 (L) 07/13/2019 0000   CrCl cannot be calculated (Patient's most recent lab result is older than the maximum 21 days allowed.).  COAG Lab Results  Component Value Date   INR 1.06 12/10/2017    Radiology No results found.  Assessment/Plan 1. Peripheral vascular disease ([place])  Recommend:  The patient has evidence of atherosclerosis of the lower extremities with claudication.  The patient does not voice lifestyle limiting changes at this point in time.  Noninvasive studies do not suggest clinically significant change.  No invasive studies, angiography or surgery at this time The patient should  continue walking and begin a more formal exercise program.  The patient should continue antiplatelet therapy and aggressive treatment of the lipid abnormalities  No changes in the patient's medications at this time  - VAS [place] ABI WITH/WO TBI; Future  2. Chronic mesenteric ischemia (HCC) Recommend:  The patient has evidence of chronic asymptomatic mesenteric atherosclerosis.  The patient denies lifestyle limiting changes at this point in time.  Given the lack of symptoms no intervention is warranted at this time.   No further invasive studies, angiography or surgery at this time  The patient should continue walking and begin a more formal exercise program.   The patient should continue antiplatelet therapy and aggressive treatment of the lipid abnormalities  Patient should undergo noninvasive  studies as ordered. The patient will follow up with me after the studies.    3. Carotid stenosis, asymptomatic, left Recommend:  Given the patient's asymptomatic subcritical stenosis no further invasive testing or surgery at this time.  Continue antiplatelet therapy as prescribed Continue management of CAD, HTN and Hyperlipidemia Healthy heart diet,  encouraged exercise at least 4 times per week Follow up as arranged - VAS US CAROTID; Future  4. Essential hypertension Continue antihypertensive medications as already ordered, these medications have been reviewed and there are no changes at this time.   5. Atherosclerosis of native coronary artery of native heart without angina pectoris Continue cardiac and antihypertensive medications as already ordered and reviewed, no changes at this time.  Continue statin as ordered and reviewed, no changes at this time  Nitrates PRN for chest pain   6. Type 2 diabetes mellitus with stage 3 chronic kidney disease, without long-term current use of insulin, unspecified whether stage 3a or 3b CKD ([place]) Continue hypoglycemic medications as already ordered, these medications have been reviewed and there are no changes at this time.  Hgb A1C to be monitored as already arranged by primary service   Hortencia Pilar, MD  01/07/2020 9:22 AM

## 2020-01-12 ENCOUNTER — Other Ambulatory Visit: Payer: Self-pay

## 2020-01-12 ENCOUNTER — Emergency Department: Payer: Medicare PPO

## 2020-01-12 ENCOUNTER — Emergency Department
Admission: EM | Admit: 2020-01-12 | Discharge: 2020-01-12 | Disposition: A | Discharge status: Home / Self Care | Payer: Medicare PPO | Attending: Emergency Medicine | Admitting: Emergency Medicine

## 2020-01-12 DIAGNOSIS — I2510 Atherosclerotic heart disease of native coronary artery without angina pectoris: Secondary | ICD-10-CM | POA: Insufficient documentation

## 2020-01-12 DIAGNOSIS — Z87891 Personal history of nicotine dependence: Secondary | ICD-10-CM | POA: Diagnosis not present

## 2020-01-12 DIAGNOSIS — E1122 Type 2 diabetes mellitus with diabetic chronic kidney disease: Secondary | ICD-10-CM | POA: Insufficient documentation

## 2020-01-12 DIAGNOSIS — Z7902 Long term (current) use of antithrombotics/antiplatelets: Secondary | ICD-10-CM | POA: Diagnosis not present

## 2020-01-12 DIAGNOSIS — Z7982 Long term (current) use of aspirin: Secondary | ICD-10-CM | POA: Insufficient documentation

## 2020-01-12 DIAGNOSIS — N183 Chronic kidney disease, stage 3 unspecified: Secondary | ICD-10-CM | POA: Insufficient documentation

## 2020-01-12 DIAGNOSIS — I10 Essential (primary) hypertension: Secondary | ICD-10-CM

## 2020-01-12 DIAGNOSIS — I129 Hypertensive chronic kidney disease with stage 1 through stage 4 chronic kidney disease, or unspecified chronic kidney disease: Secondary | ICD-10-CM | POA: Insufficient documentation

## 2020-01-12 DIAGNOSIS — Z79899 Other long term (current) drug therapy: Secondary | ICD-10-CM | POA: Diagnosis not present

## 2020-01-12 DIAGNOSIS — R1084 Generalized abdominal pain: Secondary | ICD-10-CM

## 2020-01-12 DIAGNOSIS — I700 Atherosclerosis of aorta: Secondary | ICD-10-CM

## 2020-01-12 LAB — CBC
HCT: 38 % — ABNORMAL LOW (ref 39.0–52.0)
Hemoglobin: 11.9 g/dL — ABNORMAL LOW (ref 13.0–17.0)
MCH: 31.6 pg (ref 26.0–34.0)
MCHC: 31.3 g/dL (ref 30.0–36.0)
MCV: 101.1 fL — ABNORMAL HIGH (ref 80.0–100.0)
Platelets: 204 10*3/uL (ref 150–400)
RBC: 3.76 MIL/uL — ABNORMAL LOW (ref 4.22–5.81)
RDW: 12.5 % (ref 11.5–15.5)
WBC: 7.3 10*3/uL (ref 4.0–10.5)
nRBC: 0 % (ref 0.0–0.2)

## 2020-01-12 LAB — COMPREHENSIVE METABOLIC PANEL
ALT: 22 U/L (ref 0–44)
AST: 24 U/L (ref 15–41)
Albumin: 4 g/dL (ref 3.5–5.0)
Alkaline Phosphatase: 69 U/L (ref 38–126)
Anion gap: 11 (ref 5–15)
BUN: 24 mg/dL — ABNORMAL HIGH (ref 8–23)
CO2: 28 mmol/L (ref 22–32)
Calcium: 9.3 mg/dL (ref 8.9–10.3)
Chloride: 101 mmol/L (ref 98–111)
Creatinine, Ser: 1.47 mg/dL — ABNORMAL HIGH (ref 0.61–1.24)
GFR calc Af Amer: 48 mL/min — ABNORMAL LOW (ref >60)
GFR calc non Af Amer: 41 mL/min — ABNORMAL LOW (ref >60)
Glucose, Bld: 208 mg/dL — ABNORMAL HIGH (ref 70–99)
Potassium: 4.7 mmol/L (ref 3.5–5.1)
Sodium: 140 mmol/L (ref 135–145)
Total Bilirubin: 0.8 mg/dL (ref 0.3–1.2)
Total Protein: 7.9 g/dL (ref 6.5–8.1)

## 2020-01-12 LAB — URINALYSIS, COMPLETE (UACMP) WITH MICROSCOPIC
Bilirubin Urine: NEGATIVE
Glucose, UA: NEGATIVE mg/dL
Ketones, ur: NEGATIVE mg/dL
Leukocytes,Ua: NEGATIVE
Nitrite: NEGATIVE
Protein, ur: >=300 mg/dL — AB
Specific Gravity, Urine: 1.019 (ref 1.005–1.030)
pH: 5 (ref 5.0–8.0)

## 2020-01-12 LAB — LIPASE, BLOOD: Lipase: 36 U/L (ref 11–51)

## 2020-01-12 LAB — LACTIC ACID, PLASMA: Lactic Acid, Venous: 1.2 mmol/L (ref 0.5–1.9)

## 2020-01-12 MED ORDER — AMLODIPINE BESYLATE 5 MG PO TABS
5.0000 mg | ORAL_TABLET | Freq: Once | ORAL | Status: AC
  Administered 2020-01-12: 5 mg via ORAL
  Filled 2020-01-12: qty 1

## 2020-01-12 MED ORDER — AMLODIPINE BESYLATE 5 MG PO TABS: 5.0000 mg | ORAL_TABLET | Freq: Every day | ORAL | 1 refills | Status: DC

## 2020-01-12 MED ORDER — MORPHINE SULFATE (PF) 4 MG/ML IV SOLN
4.0000 mg | Freq: Once | INTRAVENOUS | Status: AC
  Administered 2020-01-12: 4 mg via INTRAVENOUS
  Filled 2020-01-12: qty 1

## 2020-01-12 MED ORDER — CLONIDINE HCL 0.1 MG PO TABS
0.1000 mg | ORAL_TABLET | Freq: Once | ORAL | Status: AC
  Administered 2020-01-12: 0.1 mg via ORAL
  Filled 2020-01-12: qty 1

## 2020-01-12 MED ORDER — SODIUM CHLORIDE 0.9 % IV BOLUS
500.0000 mL | Freq: Once | INTRAVENOUS | Status: AC
  Administered 2020-01-12: 500 mL via INTRAVENOUS

## 2020-01-12 MED ORDER — IOHEXOL 350 MG/ML SOLN
75.0000 mL | Freq: Once | INTRAVENOUS | Status: AC | PRN
  Administered 2020-01-12: 75 mL via INTRAVENOUS

## 2020-01-12 MED ORDER — SODIUM CHLORIDE 0.9% FLUSH: 3.0000 mL | Freq: Once | INTRAVENOUS | Status: DC

## 2020-01-12 MED ORDER — ONDANSETRON HCL 4 MG/2ML IJ SOLN
4.0000 mg | Freq: Once | INTRAMUSCULAR | Status: AC
  Administered 2020-01-12: 4 mg via INTRAVENOUS
  Filled 2020-01-12: qty 2

## 2020-01-12 NOTE — ED Notes (Signed)
This RN attempted to update [name] on pt plan of care. She had left her number with ED secretary. Phone call rung twice and then went to voicemail. Will attempt again later.

## 2020-01-12 NOTE — ED Notes (Signed)
Pt head of bed adjusted. Pt has no further needs at this time.

## 2020-01-12 NOTE — ED Provider Notes (Signed)
[facility] Emergency Department Provider Note  ____________________________________________  Time seen: Approximately 10:44 AM  I have reviewed the triage vital signs and the nursing notes.   HISTORY  Chief Complaint Abdominal Pain    HPI Samuel Bryan. is a 86 y.o. male with a history of hypertension atherosclerosis renal insufficiency diabetes who comes the ED complaining of generalized abdominal pain for the past 2 days, severe, nonradiating, no aggravating or alleviating factors.  Associated with  nausea and vomiting, no diarrhea constipation or black or bloody stool.     Past Medical History:  Diagnosis Date  . Anemia   . Atherosclerosis of renal artery ([place])   . Essential hypertension, benign   . Occlusion and stenosis of carotid artery without mention of cerebral infarction    moderate left ICA stenosis  . Peripheral vascular disease, unspecified ([place])    mild lifestyle limiting claudication  . Pure hypercholesterolemia   . Renal insufficiency   . Type 2 diabetes mellitus [facility])      Patient Active Problem List   Diagnosis Date Noted  . Benign hypertensive kidney disease with chronic kidney disease 01/06/2020  . Secondary hyperparathyroidism of renal origin ([place]) 07/13/2019  . Iron deficiency anemia 11/24/2018  . Aortic atherosclerosis ([place]) 07/28/2018  . Cholelithiasis 07/28/2018  . Pancreatic lesion 07/28/2018  . Chronic mesenteric ischemia ([place]) 07/28/2018  . Full code status 01/23/2018  . Memory loss 01/23/2018  . Carotid stenosis, asymptomatic, left 12/18/2017  . Degenerative arthritis of left shoulder region 08/19/2017  . Microalbuminuria 08/19/2017  . Type 2 diabetes mellitus ([place]) 08/13/2017  . Preventative health care 12/11/2016  . Abdominal mass 04/11/2016  . Abdominal mass, right upper quadrant 04/03/2016  . Bilateral carotid bruits 04/03/2016  . Facial injury 10/26/2015  . 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  Arteriosclerosis of coronary artery 10/11/2015  . H/O angina pectoris 10/11/2015  . Hyperlipidemia 09/13/2015  . Chronic kidney disease, stage III (moderate) ([place]) 09/13/2015  . RENAL ATHEROSCLEROSIS 03/16/2009  . Essential hypertension 03/15/2009  . Coronary atherosclerosis 03/15/2009  . Carotid arterial disease ([place]) 03/15/2009  . Peripheral vascular disease ([place]) 03/15/2009  . Anemia in chronic kidney disease (CKD) 03/15/2009     Past Surgical History:  Procedure Laterality Date  . APPENDECTOMY  1970's  . COLONOSCOPY    . coronary artery stenting  1999   at [facility]   . ENDARTERECTOMY Left 12/18/2017   Procedure: ENDARTERECTOMY CAROTID WITH PATCH;  Surgeon: Delana Meyer Dolores Lory, MD;  Location: [facility] ORS;  Service: Vascular;  Laterality: Left;  . FACIAL LACERATION REPAIR Left 12/13/2015   performed at [facility] by Dr. Marcial Pacas  . renal angiography    . VISCERAL ANGIOGRAPHY N/A 08/19/2018   Procedure: VISCERAL ANGIOGRAPHY;  Surgeon: Katha Cabal, MD;  Location: [facility];  Service: Cardiovascular;  Laterality: N/A;     Prior to Admission medications   Medication Sig Start Date End Date Taking? Authorizing Provider  aspirin EC 81 MG tablet Take 81 mg by mouth daily.    Yes [provider]  atorvastatin (LIPITOR) 40 MG tablet Take 1 tablet (40 mg total) by mouth at bedtime. 09/18/19  Yes Delsa Grana, PA-C  cholecalciferol (VITAMIN D) 1000 units tablet Take 1,000 Units by mouth every other day.    Yes [provider]  clopidogrel (PLAVIX) 75 MG tablet TAKE 1 TABLET DAILY. Patient taking differently: Take 75 mg by mouth daily.  09/07/19  Yes Schnier, Dolores Lory, MD  docusate sodium (  COLACE) 100 MG capsule Take 100 mg by mouth daily as needed for mild constipation.   Yes [provider]  ferrous sulfate 325 (65 FE) MG EC tablet Take 325 mg by mouth daily.    Yes [provider]  furosemide (LASIX) 40 MG tablet Take 40 mg by mouth every other  day.  07/10/16  Yes [provider]  isosorbide mononitrate (IMDUR) 30 MG 24 hr tablet Take 1 tablet (30 mg total) by mouth daily. 08/19/17  Yes Lada, Satira Anis, MD  latanoprost (XALATAN) 0.005 % ophthalmic solution Place 1 drop into both eyes at bedtime.   Yes [provider]  linagliptin (TRADJENTA) 5 MG TABS tablet Take 1 tablet (5 mg total) by mouth daily. For diabetes Patient taking differently: Take 5 mg by mouth daily.  09/18/19  Yes Delsa Grana, PA-C  losartan (COZAAR) 100 MG tablet Take 100 mg by mouth daily. 07/10/16  Yes [provider]  Multiple Vitamin (MULTIVITAMIN) tablet Take 1 tablet by mouth daily.   Yes [provider]  Omega-3 Fatty Acids (FISH OIL) 1000 MG CAPS Take 1,000 mg by mouth daily.    Yes [provider]  timolol (BETIMOL) 0.5 % ophthalmic solution Place 1 drop into both eyes 2 (two) times daily. 11/11/15  Yes Ashok Norris, MD  vitamin B-12 (CYANOCOBALAMIN) 1000 MCG tablet Take 1,000 mcg by mouth daily.   Yes [provider]  amLODipine (NORVASC) 5 MG tablet Take 1 tablet (5 mg total) by mouth daily. 01/12/20   Carrie Mew, MD     Allergies Patient has no known allergies.   Family History  Problem Relation Age of Onset  . Diabetes Mother   . Heart disease Mother   . Hypertension Brother   . Hyperlipidemia Brother   . Diabetes Brother   . Hypertension Son   . Hypertension Son   . Birth defects Neg Hx   . Stroke Neg Hx     Social History Social History   Tobacco Use  . Smoking status: Former Smoker    Types: Cigarettes    Quit date: 10/29/1970    Years since quitting: 49.2  . Smokeless tobacco: Never Used  Substance Use Topics  . Alcohol use: No    Alcohol/week: 0.0 standard drinks  . Drug use: No    Review of Systems  Constitutional:   No fever or chills.  ENT:   No sore throat. No rhinorrhea. Cardiovascular:   No chest pain or syncope. Respiratory:   No dyspnea or  cough. Gastrointestinal: Positive as above for abdominal pain and vomiting Musculoskeletal:   Negative for focal pain or swelling All other systems reviewed and are negative except as documented above in ROS and HPI.  ____________________________________________   PHYSICAL EXAM:  VITAL SIGNS: ED Triage Vitals  Enc Vitals Group     BP 01/12/20 0747 (!) 172/76     Pulse Rate 01/12/20 0747 63     Resp 01/12/20 0747 16     Temp 01/12/20 0747 97.6 F (36.4 C)     Temp Source 01/12/20 0747 Oral     SpO2 01/12/20 0747 99 %     Weight 01/12/20 0750 160 lb (72.6 kg)     Height 01/12/20 0750 5\' 6"  (1.676 m)     Head Circumference --      Peak Flow --      Pain Score 01/12/20 0750 10     Pain Loc --      Pain Edu? --  Excl. in [place]? --     Vital signs reviewed, nursing assessments reviewed.   Constitutional:   Alert and oriented. Non-toxic appearance. Eyes:   Conjunctivae are normal. EOMI. PERRL. ENT      Head:   Normocephalic and atraumatic.      Nose:   Wearing a mask.      Mouth/Throat:   Wearing a mask.      Neck:   No meningismus. Full ROM. Hematological/Lymphatic/Immunilogical:   No cervical lymphadenopathy. Cardiovascular:   RRR. Symmetric bilateral radial and DP pulses.  No murmurs. Cap refill less than 2 seconds. Respiratory:   Normal respiratory effort without tachypnea/retractions. Breath sounds are clear and equal bilaterally. No wheezes/rales/rhonchi. Gastrointestinal:   Soft and nontender. Non distended. There is no CVA tenderness.  No rebound, rigidity, or guarding.  No hernias Musculoskeletal:   Normal range of motion in all extremities. No joint effusions.  No lower extremity tenderness.  No edema. Neurologic:   Normal speech and language.  Motor grossly intact. No acute focal neurologic deficits are appreciated.  Skin:    Skin is warm, dry and intact. No rash noted.  No petechiae, purpura, or bullae.  ____________________________________________    LABS  (pertinent positives/negatives) (all labs ordered are listed, but only abnormal results are displayed) Labs Reviewed  COMPREHENSIVE METABOLIC PANEL - Abnormal; Notable for the following components:      Result Value   Glucose, Bld 208 (*)    BUN 24 (*)    Creatinine, Ser 1.47 (*)    GFR calc non Af Amer 41 (*)    GFR calc Af Amer 48 (*)    All other components within normal limits  CBC - Abnormal; Notable for the following components:   RBC 3.76 (*)    Hemoglobin 11.9 (*)    HCT 38.0 (*)    MCV 101.1 (*)    All other components within normal limits  URINALYSIS, COMPLETE (UACMP) WITH MICROSCOPIC - Abnormal; Notable for the following components:   Color, Urine YELLOW (*)    APPearance HAZY (*)    Hgb urine dipstick SMALL (*)    Protein, ur >=300 (*)    Bacteria, UA RARE (*)    All other components within normal limits  LIPASE, BLOOD  LACTIC ACID, PLASMA   ____________________________________________   EKG  Interpreted by me Sinus rhythm rate of 61, normal axis and intervals.  Normal QRS ST segments and T waves.  3 PVCs on the strip.  ____________________________________________    RADIOLOGY  CT Angio Abd/Pel W and/or Wo Contrast  Result Date: 01/12/2020 CLINICAL DATA:  86 year old male with generalized abdominal pain, nausea and vomiting. EXAM: CTA ABDOMEN AND PELVIS WITHOUT AND WITH CONTRAST TECHNIQUE: Multidetector CT imaging of the abdomen and pelvis was performed using the standard protocol during bolus administration of intravenous contrast. Multiplanar reconstructed images and MIPs were obtained and reviewed to evaluate the vascular anatomy. CONTRAST:  62mL OMNIPAQUE IOHEXOL 350 MG/ML SOLN COMPARISON:  Prior CTA of the abdomen and pelvis 07/25/2018 FINDINGS: VASCULAR Aorta: Fairly extensive calcified atherosclerotic plaque throughout the abdominal aorta. Slight interval progression of a penetrating atherosclerotic ulcer resulting in a chronic non flow limiting dissection  of the infrarenal segment. The aorta remains within normal limits for size. Celiac: Status post stenting of the stenosis at the origin of the celiac artery. The stent appears to be widely patent. No evidence of in stent or edge stenosis. 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 The vessel  reconstitutes via collateral flow. Renals: Calcified atherosclerotic plaque at the origin of the bilateral solitary renal arteries. On the right, this results in mild focal stenosis. On the left, there is moderate to high-grade focal stenosis. No evidence of dissection or aneurysm. IMA: The IMA remains patent. The origin is relatively obscured secondary to calcified atherosclerotic plaque, however there does appear to be a moderate focal stenosis at the origin. Inflow: Interval placement of kissing iliac stents. The stents are patent, however bulky calcified plaque results in external compression of the proximal aspect of the right common iliac stent resulting in approximately 40% diameter loss. No evidence of in stent or edge stenosis. The external iliac arteries are relatively spared from disease. Proximal Outflow: Patent bilaterally. Calcified atherosclerotic plaque present in both common femoral arteries. Veins: No focal venous abnormality. Review of the MIP images confirms the above findings. NON-VASCULAR Lower chest: The heart is normal in size. No pericardial effusion. Atherosclerotic calcifications throughout the native coronary arteries. Unremarkable distal thoracic esophagus. Chronic elevation of the right hemidiaphragm. Mild dependent atelectasis. No focal airspace consolidation, evidence of pulmonary edema, pleural effusion or pneumothorax. No suspicious pulmonary mass or nodule. Hepatobiliary: Normal hepatic contour and morphology. No discrete hepatic lesion. Gallstones are present within the gallbladder lumen. No intra or extrahepatic biliary ductal dilatation. Pancreas: Stable mild  pancreatic ductal dilatation up to 6 mm. Focal low-attenuation lesion in the mid pancreatic body measures up to 7 mm and demonstrates no interval change compared to prior imaging. This is likely a benign finding. No discrete pancreatic mass or inflammatory changes. Spleen: Normal in size without focal abnormality. Adrenals/Urinary Tract: Normal adrenal glands. No enhancing lesion, hydronephrosis or nephrolithiasis. Tiny circumscribed low-attenuation lesions in the kidneys bilaterally are nonspecific but unchanged compared to prior imaging and highly likely to represent benign cysts. Ureters and bladder are within normal limits for age. Stomach/Bowel: No focal bowel wall thickening or evidence of obstruction. Mild colonic interpositioning. Moderately large rectal stool ball. Lymphatic: No suspicious lymphadenopathy. Reproductive: Within normal limits for age. Other: No abdominal wall hernia or abnormality. No abdominopelvic ascites. Musculoskeletal: No acute or significant osseous findings. Multilevel degenerative disc disease. Minimal grade 1 anterolisthesis of L4 on L5. IMPRESSION: VASCULAR 1. Patent stent in the origin of the celiac axis without evidence of in stent or edge stenosis. 2. Slight interval enlargement of penetrating atherosclerotic ulcer arising along the posterior wall of the infrarenal abdominal aorta and resulting in a short segment non flow limiting dissection. No evidence of aneurysmal change, wall thickening or irregularity. 3. Stable chronic occlusion of the SMA. 4. Probable moderate focal stenosis at the origin of the IMA. 5. Moderate to advanced left and mild right renal artery stenoses. 6. Interval placement of kissing iliac stents. Bulky calcified plaque results in some external compression of the proximal aspect of the right stent resulting in approximately 40% decreased diameter compared to the remainder of the stent. NON-VASCULAR 1. No evidence of focal bowel wall thickening,  inflammatory changes or obstruction. 2. Cholelithiasis with mild gallbladder distention but no wall thickening or pericholecystic fluid. 3. Moderate rectal stool ball suggests constipation. 4. Additional ancillary findings as above with out significant interval change. Signed, Criselda Peaches, MD, [facility] Radiology Electronically Signed   By: Jacqulynn Cadet M.D.   On: 01/12/2020 12:22    ____________________________________________   PROCEDURES Procedures  ____________________________________________  DIFFERENTIAL DIAGNOSIS   Mesenteric ischemia, abdominal aneurysm, diverticulitis, small bowel obstruction  CLINICAL IMPRESSION / ASSESSMENT AND PLAN /  ED COURSE  Medications ordered in the ED: Medications  sodium chloride 0.9 % bolus 500 mL (0 mLs Intravenous Stopped 01/12/20 1410)  ondansetron (ZOFRAN) injection 4 mg (4 mg Intravenous Given 01/12/20 1023)  morphine 4 MG/ML injection 4 mg (4 mg Intravenous Given 01/12/20 1023)  iohexol (OMNIPAQUE) 350 MG/ML injection 75 mL (75 mLs Intravenous Contrast Given 01/12/20 1041)  amLODipine (NORVASC) tablet 5 mg (5 mg Oral Given 01/12/20 1412)  cloNIDine (CATAPRES) tablet 0.1 mg (0.1 mg Oral Given 01/12/20 1412)  cloNIDine (CATAPRES) tablet 0.1 mg (0.1 mg Oral Given 01/12/20 1500)    Pertinent labs & imaging results that were available during my care of the patient were reviewed by me and considered in my medical decision making (see chart for details).  Jay Johnie Makki. was evaluated in Emergency Department on 01/12/2020 for the symptoms described in the history of present illness. He was evaluated in the context of the global COVID-19 pandemic, which necessitated consideration that the patient might be at risk for infection with the SARS-CoV-2 virus that causes COVID-19. Institutional protocols and algorithms that pertain to the evaluation of patients at risk for COVID-19 are in a state  of rapid change based on information released by regulatory bodies including the CDC and federal and state organizations. These policies and algorithms were followed during the patient's care in the ED.   86 year old male with multiple comorbidities presents with abdominal pain for the past 2 days, pain out of proportion on exam.  Will give IV fluids, IV morphine 4 mg for pain control, IV Zofran 4 mg for nausea control.  Obtain CT angiogram of the abdomen and pelvis to evaluate for vascular pathology.   ----------------------------------------- 2:09 PM on 01/12/2020 -----------------------------------------  CT shows patent stents to bilateral iliacs and celiac trunk.  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  Moderate stenosis of IMA origin.  Small segment of posterior wall atherosclerotic ulceration and dissection of abdominal aorta.  Discussed with vascular surgery Dr. Delana Meyer who notes that in absence of back pain this atherosclerotic finding is unlikely significance.  Patient's blood pressure is severely elevated.  We will focus on blood pressure control prior to discharge from the ED, increase his amlodipine and have him follow-up with his doctor.  Patient is feeling better, pain resolved.  Other vital signs are unremarkable.  No other acute findings on CT or labs.  Lactate level is normal.  ----------------------------------------- 3:25 PM on 01/12/2020 -----------------------------------------  Hypertension persistent despite clonidine and increased amlodipine given in the ED.  However, he is currently asymptomatic, feeling well so do not think that he needs to be hospitalized for acute blood pressure management.  Prescription for increased amlodipine given, instructed to follow-up with primary care for further blood pressure evaluation.     ____________________________________________   FINAL CLINICAL IMPRESSION(S) / ED DIAGNOSES    Final diagnoses:  Generalized abdominal pain   Atherosclerosis of abdominal aorta ([place])  Hypertension   ED Discharge Orders         Ordered    amLODipine (NORVASC) 5 MG tablet  Daily     01/12/20 1407          Portions of this note were generated with dragon dictation software. Dictation errors may occur despite best attempts at proofreading.   Carrie Mew, MD 01/12/20 1526

## 2020-01-12 NOTE — Discharge Instructions (Signed)
Your CT scan was overall reassuring and showed good blood flow to all of your abdominal organs.  There is a small area of ulceration to the back of your aorta.  You should return to the hospital right away if you have sudden severe back pain or unremitting abdominal pain.  Continue taking all of your medications.  We are increasing your blood pressure medicine to better control your blood pressure.  You should follow-up with your primary care doctor in 3 to 7 days to recheck your blood pressure and continue adjusting medications as needed.

## 2020-01-12 NOTE — ED Notes (Signed)
Pt in CT at this time.

## 2020-01-12 NOTE — ED Triage Notes (Signed)
Pt c/o generalized abd pain with N/v for the past 2 days,. Denies diarrhea. States he was not able to sleep any last night due to the pain. Pt is HOH .

## 2020-01-13 ENCOUNTER — Telehealth: Payer: Self-pay | Admitting: Family Medicine

## 2020-01-13 NOTE — Telephone Encounter (Signed)
Please schedule follow-up to discuss ER visit

## 2020-01-13 NOTE — Telephone Encounter (Signed)
Pt was in the ER yesterday and they increased his BP meds and before that he seen another provider(kidney Dr) that  gave him and increased BP meds because his BP was high  / Pt wants PCP to know about what is causing his stomach pain / he was advised if his BP goes down he should ease pain in stomach/ please call for more information   [name] stated he said he is feeling weak and she wanted PCP to read his chart notes from ER

## 2020-01-14 ENCOUNTER — Telehealth: Payer: Self-pay | Admitting: *Deleted

## 2020-01-14 NOTE — Telephone Encounter (Signed)
Spoke with patient's daughter, [name], again to try and get patient to come in for a visit on tomorrow, 01/15/20.  [name] stated that patient would like to wait to see how is blood pressure will be in the morning before scheduling an appointment.  [name] stated that Mr. [place] blood pressure on yesterday, 01/13/20 was 128/65 and this evening it is 102/56 and patient prefers to do.  [name] stated that she will call our office back in the morning by 11 AM to left [place] know if we can schedule him for tomorrow, 01/15/20.

## 2020-01-14 NOTE — Telephone Encounter (Signed)
Please call pt and see if we can get him in to recheck BP and med adjustments-  even if with Dr. Roxan Hockey to be checked, since this is an appropriate pt for MD to be managing, and I have only seen him once.  His BP is usually a little higher and his recent med changes during times of pain may be too strong for him.   I would want to get him in tomorrow before the weekend please

## 2020-01-14 NOTE — Telephone Encounter (Signed)
Spoke to patient's daughter. He continues to feel weak and has had adjustments to blood pressure medications recently. Was seen in ER 2 days ago with SBP > 200, now around 120s. I reviewed his labs from ER which looked stable to improved and feel symptoms unlikely secondary to anemia and leukopenia that we follow him for. Would like him to be evaluated by his PCP. If symptoms persist we can see him in the future. [name] agreeable with plan and will call PCP.

## 2020-01-14 NOTE — Telephone Encounter (Signed)
Called and spoke with patient's daughter, [name], to get Mr. Samuel Bryan in our office for an appointment to check is blood pressure per Delsa Grana.  [name] stated that she would go to dad's home to see if he wants to come in tomorrow.  [name] asked if I would call her back in 20 minutes so she can let me know his response.  I informed [name] that I would.

## 2020-01-14 NOTE — Telephone Encounter (Signed)
Can one of you please call him. He is a reasonably fit 86 yr old but I see him for anemia/leukopenia. Not sure How we can help.

## 2020-01-14 NOTE — Telephone Encounter (Signed)
Samuel Bryan called reporting that patient has been very weak for past 2 days and would like a return call. Please advise

## 2020-01-14 NOTE — Telephone Encounter (Signed)
I called and spoke to daughter Samuel Bryan and she said he had 1st covid vaccine 2/28. His b/p was up and they saw kidney doctor and they increase b/p med. Hen day before yest. He went to ER because of stomach pain and ct scan. His b/p was still up so daughter states inc. B/p again. Then yest she checked on him and his b/p was 128/65.  I told daughter that I would pass info on and call bac. Ander Purpura is calling daughter back.

## 2020-01-15 ENCOUNTER — Telehealth: Payer: Self-pay | Admitting: Family Medicine

## 2020-01-15 NOTE — Telephone Encounter (Signed)
Pt daughter notified, He is scheduled for 1st available April 1st

## 2020-01-15 NOTE — Telephone Encounter (Signed)
Pt did not schedule appt, would be happy to eval if they will schedule appt

## 2020-01-15 NOTE — Telephone Encounter (Signed)
Kepler Mccabe is calling to let leisa know patient bp today is 120/61. Pt is not having any symptoms. Please advise

## 2020-01-15 NOTE — Telephone Encounter (Signed)
Thank you for checking on him!

## 2020-01-15 NOTE — Telephone Encounter (Signed)
Pt refuses appt at this time

## 2020-01-28 ENCOUNTER — Ambulatory Visit: Payer: Medicare Other | Admitting: Family Medicine

## 2020-02-02 ENCOUNTER — Telehealth: Payer: Self-pay | Admitting: Family Medicine

## 2020-03-01 LAB — HM DIABETES EYE EXAM

## 2020-03-04 ENCOUNTER — Telehealth: Payer: Self-pay | Admitting: Family Medicine

## 2020-03-04 NOTE — Telephone Encounter (Signed)
Left message for patient to schedule Annual Wellness Visit.  Please schedule with Nurse Health Advisor Clemetine Marker, RN.   Disregard telephone message left on 02/02/20

## 2020-03-18 ENCOUNTER — Telehealth: Payer: Self-pay | Admitting: Primary Care

## 2020-03-18 ENCOUNTER — Other Ambulatory Visit: Payer: Self-pay

## 2020-03-18 ENCOUNTER — Ambulatory Visit (INDEPENDENT_AMBULATORY_CARE_PROVIDER_SITE_OTHER): Payer: Medicare PPO | Admitting: Family Medicine

## 2020-03-18 ENCOUNTER — Ambulatory Visit: Payer: Medicare PPO

## 2020-03-18 ENCOUNTER — Encounter: Payer: Self-pay | Admitting: Family Medicine

## 2020-03-18 VITALS — BP 92/52 | HR 63 | Temp 97.5°F | Resp 14 | Ht 66.0 in | Wt 136.3 lb

## 2020-03-18 DIAGNOSIS — E1122 Type 2 diabetes mellitus with diabetic chronic kidney disease: Secondary | ICD-10-CM | POA: Diagnosis not present

## 2020-03-18 DIAGNOSIS — N183 Chronic kidney disease, stage 3 unspecified: Secondary | ICD-10-CM

## 2020-03-18 DIAGNOSIS — D509 Iron deficiency anemia, unspecified: Secondary | ICD-10-CM | POA: Diagnosis not present

## 2020-03-18 DIAGNOSIS — I700 Atherosclerosis of aorta: Secondary | ICD-10-CM

## 2020-03-18 DIAGNOSIS — I10 Essential (primary) hypertension: Secondary | ICD-10-CM

## 2020-03-18 DIAGNOSIS — Z5181 Encounter for therapeutic drug level monitoring: Secondary | ICD-10-CM

## 2020-03-18 DIAGNOSIS — E782 Mixed hyperlipidemia: Secondary | ICD-10-CM

## 2020-03-18 DIAGNOSIS — R634 Abnormal weight loss: Secondary | ICD-10-CM

## 2020-03-18 MED ORDER — LINAGLIPTIN 5 MG PO TABS: ORAL_TABLET | ORAL | 3 refills | Status: AC

## 2020-03-18 NOTE — Telephone Encounter (Signed)
Spoke with patient's daughter [name] regarding Palliative services and all questions were answered.  Daughter wanted to discuss this with her brother first before scheduling the appointment.  I have emailed her the Palliative Brochure for their review.  Daughter will call me back after talking with her brother.

## 2020-03-18 NOTE — Patient Instructions (Addendum)
I am happy with his blood pressure >100/60 and less than 150/90  Keep taking the meds the same and monitor for the next 2 weeks and follow up     How to Take Your Blood Pressure You can take your blood pressure at home with a machine. You may need to check your blood pressure at home:  To check if you have high blood pressure (hypertension).  To check your blood pressure over time.  To make sure your blood pressure medicine is working. Supplies needed: You will need a blood pressure machine, or monitor. You can buy one at a drugstore or online. When choosing one:  Choose one with an arm cuff.  Choose one that wraps around your upper arm. Only one finger should fit between your arm and the cuff.  Do not choose one that measures your blood pressure from your wrist or finger. Your doctor can suggest a monitor. How to prepare Avoid these things for 30 minutes before checking your blood pressure:  Drinking caffeine.  Drinking alcohol.  Eating.  Smoking.  Exercising. Five minutes before checking your blood pressure:  Pee.  Sit in a dining chair. Avoid sitting in a soft couch or armchair.  Be quiet. Do not talk. How to take your blood pressure Follow the instructions that came with your machine. If you have a digital blood pressure monitor, these may be the instructions: 1. Sit up straight. 2. Place your feet on the floor. Do not cross your ankles or legs. 3. Rest your left arm at the level of your heart. You may rest it on a table, desk, or chair. 4. Pull up your shirt sleeve. 5. Wrap the blood pressure cuff around the upper part of your left arm. The cuff should be 1 inch (2.5 cm) above your elbow. It is best to wrap the cuff around bare skin. 6. Fit the cuff snugly around your arm. You should be able to place only one finger between the cuff and your arm. 7. Put the cord inside the groove of your elbow. 8. Press the power button. 9. Sit quietly while the cuff fills  with air and loses air. 10. Write down the numbers on the screen. 11. Wait 2-3 minutes and then repeat steps 1-10. What do the numbers mean? Two numbers make up your blood pressure. The first number is called systolic pressure. The second is called diastolic pressure. An example of a blood pressure reading is "120 over 80" (or 120/80). If you are an adult and do not have a medical condition, use this guide to find out if your blood pressure is normal: Normal  First number: below 120.  Second number: below 80. Elevated  First number: 120-129.  Second number: below 80. Hypertension stage 1  First number: 130-139.  Second number: 80-89. Hypertension stage 2  First number: 140 or above.  Second number: 93 or above. Your blood pressure is above normal even if only the top or bottom number is above normal. Follow these instructions at home:  Check your blood pressure as often as your doctor tells you to.  Take your monitor to your next doctor's appointment. Your doctor will: ? Make sure you are using it correctly. ? Make sure it is working right.  Make sure you understand what your blood pressure numbers should be.  Tell your doctor if your medicines are causing side effects. Contact a doctor if:  Your blood pressure keeps being high. Get help right away if:  Your first blood pressure number is higher than 180.  Your second blood pressure number is higher than 120. This information is not intended to replace advice given to you by your health care provider. Make sure you discuss any questions you have with your health care provider. Document Revised: 09/27/2017 Document Reviewed: 03/23/2016 Elsevier Patient Education  2020 [employer].

## 2020-03-18 NOTE — Progress Notes (Signed)
Patient ID: Samuel Bryan., male    DOB: [birth year], 86 y.o.   MRN: [id number]  PCP: Delsa Grana, PA-C  Chief Complaint  Patient presents with  . Follow-up  . Hypertension  . Diabetes  . Hyperlipidemia    Subjective:   Samuel Bryan. is a 86 y.o. male, presents to clinic with CC of the following:  HPI   HTN:  Here for HTN - have been labile, very high and then bottoming out Currently managed on imdur 30 mg daily, lasix 40 mg, losartan 100 mg and amlodipine Some lightheaded episodes Blood pressure today is fairly well controlled, but has been lower at home, sometimes diastolic is 86-57  Was 846/96 at home  BP Readings from Last 10 Encounters:  03/18/20 (!) 132/56  01/12/20 (!) 213/74  01/07/20 (!) 179/69  09/18/19 (!) 142/76  07/13/19 (!) 178/69  06/17/19 (!) 146/60  05/21/19 (!) 130/50  04/08/19 (!) 172/68  03/06/19 (!) 141/58  01/29/19 (!) 148/77  Just saw cardiologist amlodipine from 2.5 to 5 mg daily - lower BP readings at home lower, pt getting lightheaded and tired easily, they are concerned with changed in BP, no syncope, HA, CP, LE edema  Questions about ER visit and abdominal pain -have reviewed the ER visit with them today reviewed their findings and reviewed most recent imaging Pancreatic lesion 7 mm and mild ductal dilation stable over 2 years with scans No ab pain or complaints  No weight loss - reviewed imaging with pt and family/daughter  HLD-   On lipitor 40 mg takes at bedtime, tolerates, no myalgias or SE   Iron deficiency - on iron supplement every other day cannot tolerate every day and doing stool softner    Patient Active Problem List   Diagnosis Date Noted  . Benign hypertensive kidney disease with chronic kidney disease 01/06/2020  . Secondary hyperparathyroidism of renal origin ([place]) 07/13/2019  . Iron deficiency anemia 11/24/2018  . Aortic atherosclerosis ([place]) 07/28/2018  . Cholelithiasis 07/28/2018  . Pancreatic lesion  07/28/2018  . Chronic mesenteric ischemia ([place]) 07/28/2018  . Full code status 01/23/2018  . Memory loss 01/23/2018  . Carotid stenosis, asymptomatic, left 12/18/2017  . Degenerative arthritis of left shoulder region 08/19/2017  . Microalbuminuria 08/19/2017  . Type 2 diabetes mellitus ([place]) 08/13/2017  . Preventative health care 12/11/2016  . Abdominal mass 04/11/2016  . Abdominal mass, right upper quadrant 04/03/2016  . Bilateral carotid bruits 04/03/2016  . Facial injury 10/26/2015  . 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 Arteriosclerosis of coronary artery 10/11/2015  . H/O angina pectoris 10/11/2015  . Hyperlipidemia 09/13/2015  . Chronic kidney disease, stage III (moderate) ([place]) 09/13/2015  . RENAL ATHEROSCLEROSIS 03/16/2009  . Essential hypertension 03/15/2009  . Coronary atherosclerosis 03/15/2009  . Carotid arterial disease ([place]) 03/15/2009  . Peripheral vascular disease ([place]) 03/15/2009  . Anemia in chronic kidney disease (CKD) 03/15/2009      Current Outpatient Medications:  .  amLODipine (NORVASC) 5 MG tablet, Take 1 tablet (5 mg total) by mouth daily., Disp: 30 tablet, Rfl: 1 .  aspirin EC 81 MG tablet, Take 81 mg by mouth daily. , Disp: , Rfl:  .  atorvastatin (LIPITOR) 40 MG tablet, Take 1 tablet (40 mg total) by mouth at bedtime., Disp: 90 tablet, Rfl: 1 .  clopidogrel (PLAVIX) 75 MG tablet, TAKE 1 TABLET DAILY. (Patient taking differently: Take 75 mg by mouth daily. ), Disp: 90 tablet, Rfl: 0 .  docusate sodium (COLACE)  100 MG capsule, Take 100 mg by mouth daily as needed for mild constipation., Disp: , Rfl:  .  ferrous sulfate 325 (65 FE) MG EC tablet, Take 325 mg by mouth daily. , Disp: , Rfl:  .  furosemide (LASIX) 40 MG tablet, Take 40 mg by mouth every other day. , Disp: , Rfl:  .  isosorbide mononitrate (IMDUR) 30 MG 24 hr tablet, Take 1 tablet (30 mg total) by mouth daily., Disp: , Rfl:  .  latanoprost (XALATAN) 0.005 % ophthalmic solution, Place 1 drop  into both eyes at bedtime., Disp: , Rfl:  .  linagliptin (TRADJENTA) 5 MG TABS tablet, Take 1 tablet (5 mg total) by mouth daily. For diabetes (Patient taking differently: Take 5 mg by mouth daily. ), Disp: 90 tablet, Rfl: 1 .  losartan (COZAAR) 100 MG tablet, Take 100 mg by mouth daily., Disp: , Rfl:  .  Multiple Vitamin (MULTIVITAMIN) tablet, Take 1 tablet by mouth daily., Disp: , Rfl:  .  Omega-3 Fatty Acids (FISH OIL) 1000 MG CAPS, Take 1,000 mg by mouth daily. , Disp: , Rfl:  .  timolol (BETIMOL) 0.5 % ophthalmic solution, Place 1 drop into both eyes 2 (two) times daily., Disp: 10 mL, Rfl: 12   No Known Allergies   Family History  Problem Relation Age of Onset  . Diabetes Mother   . Heart disease Mother   . Hypertension Brother   . Hyperlipidemia Brother   . Diabetes Brother   . Hypertension Son   . Hypertension Son   . Birth defects Neg Hx   . Stroke Neg Hx      Social History   Socioeconomic History  . Marital status: Widowed    Spouse name: Not on file  . Number of children: 5  . Years of education: 10  . Highest education level: Not on file  Occupational History  . Occupation: retired  Tobacco Use  . Smoking status: Former Smoker    Types: Cigarettes    Quit date: 10/29/1970    Years since quitting: 49.4  . Smokeless tobacco: Never Used  Substance and Sexual Activity  . Alcohol use: No    Alcohol/week: 0.0 standard drinks  . Drug use: No  . Sexual activity: Not Currently  Other Topics Concern  . Not on file  Social History Narrative   widowed, all children are healthy.    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    Social Determinants of Health   Financial Resource Strain:   . Difficulty of Paying Living Expenses:   Food Insecurity:   . Worried About Charity fundraiser in the Last Year:   . Arboriculturist in the Last Year:   Transportation Needs:   . Film/video editor (Medical):   Marland Kitchen Lack of  Transportation (Non-Medical):   Physical Activity: Sufficiently Active  . Days of Exercise per Week: 5 days  . Minutes of Exercise per Session: 60 min  Stress:   . Feeling of Stress :   Social Connections:   . Frequency of Communication with Friends and Family:   . Frequency of Social Gatherings with Friends and Family:   . Attends Religious Services:   . Active Member of Clubs or Organizations:   . Attends Archivist Meetings:   Marland Kitchen Marital Status:   Intimate Partner Violence:   . Fear of Current or Ex-Partner:   . Emotionally Abused:   .  Physically Abused:   . Sexually Abused:     Chart Review Today: I personally reviewed active problem list, medication list, allergies, family history, social history, health maintenance, notes from last encounter, lab results, imaging with the patient/caregiver today.   Review of Systems 10 Systems reviewed and are negative for acute change except as noted in the HPI.     Objective:   Vitals:   03/18/20 0856  BP: (!) 132/56  Pulse: 63  Resp: 14  Temp: (!) 97.5 F (36.4 C)  SpO2: 100%  Weight: 136 lb 4.8 oz (61.8 kg)  Height: 5\' 6"  (1.676 m)    Body mass index is 22 kg/m.  Physical Exam Vitals and nursing note reviewed.  Constitutional:      General: He is not in acute distress.    Appearance: Normal appearance. He is well-developed. He is not ill-appearing, toxic-appearing or diaphoretic.     Comments: Slightly thin appearing elderly male, well-appearing, alert, no acute distress hard of hearing  HENT:     Head: Normocephalic and atraumatic.     Nose: Nose normal.     Mouth/Throat:     Mouth: Mucous membranes are moist.     Pharynx: Oropharynx is clear.  Eyes:     General:        Right eye: No discharge.        Left eye: No discharge.     Conjunctiva/sclera: Conjunctivae normal.  Neck:     Trachea: No tracheal deviation.  Cardiovascular:     Rate and Rhythm: Normal rate and regular rhythm.     Pulses: Normal  pulses.     Heart sounds: Normal heart sounds.  Pulmonary:     Effort: Pulmonary effort is normal. No respiratory distress.     Breath sounds: Normal breath sounds. No stridor. No wheezing or rhonchi.  Abdominal:     General: Bowel sounds are normal. There is no distension.     Tenderness: There is no abdominal tenderness. There is no right CVA tenderness or left CVA tenderness.  Musculoskeletal:        General: Normal range of motion.     Right lower leg: No edema.     Left lower leg: No edema.  Skin:    General: Skin is warm and dry.     Capillary Refill: Capillary refill takes less than 2 seconds.     Coloration: Skin is not jaundiced or pale.     Findings: No rash.  Neurological:     Mental Status: He is alert. Mental status is at baseline.     Motor: No abnormal muscle tone.     Coordination: Coordination normal.  Psychiatric:        Mood and Affect: Mood normal.        Behavior: Behavior normal.      Results for orders placed or performed in visit on 03/02/20  HM DIABETES EYE EXAM  Result Value Ref Range   HM Diabetic Eye Exam No Retinopathy No Retinopathy        Assessment & Plan:    Patient and his daughter present today for follow-up on chronic conditions, concerns of abnormal and labile blood pressure readings, follow-up on weight loss, and had multiple questions about his most recent ER visit for abdominal pain.  ER visit, records, lab results and imaging results and past imaging results were reviewed with the patient and his daughter today He has a history of pancreatic lesion which has been stable over the  past several years, stable pancreatic ductal dilatation, they have consulted with specialist in the past regarding this, I did not see any new or concerning findings or changes and is currently asymptomatic denies any abdominal pain.  He is more worried about eating healthy foods instead of worried about adequate nutrition, we have discussed this before when  patient and his daughter were concerned about weight loss.  He was encouraged to make sure he has adequate calories, supplement with boost or Ensure.  Recheck of his weight today -I last saw him November 2020 with family Amber who had the exact same concerns concern for weight loss, I encouraged closer follow-up and had referred to specialist at that time but they refused to talk to dietitian, chronic care management or palliative care at that time, she chose to bring him back for 21-month follow-up, his weight has gone up 2 pounds from November to May.  Reviewed again with him adequate nutrition and calories. Wt Readings from Last 5 Encounters:  03/18/20 136 lb 4.8 oz (61.8 kg)  01/12/20 160 lb (72.6 kg)  01/07/20 140 lb (63.5 kg)  09/18/19 134 lb 1.6 oz (60.8 kg)   For the patient's blood pressure encourage them to for now keep medications the same and monitor at home I gave blood pressure parameters 100/60-150/90 that I was happy with as long as pt was not having any concerning symptoms, I did encourage him to follow-up with cardiology if they had additional concerns or questions about recent med changes.  Did have a lengthy discussion with the patient and his daughter today about establishing with palliative care or establishing with one of the physicians or an internal medicine specialist to help him with appropriate management of chronic conditions and his last decades of life.  Discussed goals of care -quantity of life versus quality -and also discussed with them that medications and treatment goals change with older age- for example I did feel that he would be able to stop his statin for his age.      ICD-10-CM   1. Essential hypertension  G26 COMPLETE METABOLIC PANEL WITH GFR   labile, very high readings with meds increased by cardiology, home readings BP low to soft, mildly symptomatic, today BP okay and map okay, monitor/close f/up  2. Type 2 diabetes mellitus with stage 3 chronic kidney  disease, without long-term current use of insulin, unspecified whether stage 3a or 3b CKD (HCC)  R48.54 COMPLETE METABOLIC PANEL WITH GFR   N18.30 Lipid panel    Hemoglobin A1c    linagliptin (TRADJENTA) 5 MG TABS tablet   due for labs, meds refilled  3. Aortic atherosclerosis ([facility])  O27.0 COMPLETE METABOLIC PANEL WITH GFR    Lipid panel   On statins, monitoring, sees vascular specialist  4. Iron deficiency anemia, unspecified iron deficiency anemia type  D50.9 CBC with Differential/Platelet    Iron, TIBC and Ferritin Panel   some GI sx and intolerance of PO iron supplement, encouraged to take every other day if cannot tolerate daily, recheck labs  5. Recent unexplained weight loss  R63.4 Amb ref to Medical Nutrition Therapy-MNT    Amb Referral to Palliative Care   Weight loss and some changes in appetite, with patient's age, becoming more frail and losing weight suggested they consult with palliative care  6. Mixed hyperlipidemia  E78.2    On Lipitor 40 mg, takes at bedtime, for age could discontinue this medication  7. Encounter for medication monitoring  Z51.81 CBC with Differential/Platelet  COMPLETE METABOLIC PANEL WITH GFR    Lipid panel    Iron, TIBC and Ferritin Panel    Hemoglobin A1c        Delsa Grana, PA-C 03/18/20 9:35 AM

## 2020-03-19 ENCOUNTER — Other Ambulatory Visit: Payer: Self-pay | Admitting: Family Medicine

## 2020-03-19 LAB — CBC WITH DIFFERENTIAL/PLATELET
Absolute Monocytes: 788 cells/uL (ref 200–950)
Basophils Absolute: 9 cells/uL (ref 0–200)
Basophils Relative: 0.2 %
Eosinophils Absolute: 79 cells/uL (ref 15–500)
Eosinophils Relative: 1.8 %
HCT: 30.7 % — ABNORMAL LOW (ref 38.5–50.0)
Hemoglobin: 9.8 g/dL — ABNORMAL LOW (ref 13.2–17.1)
Lymphs Abs: 708 cells/uL — ABNORMAL LOW (ref 850–3900)
MCH: 31.8 pg (ref 27.0–33.0)
MCHC: 31.9 g/dL — ABNORMAL LOW (ref 32.0–36.0)
MCV: 99.7 fL (ref 80.0–100.0)
MPV: 11.9 fL (ref 7.5–12.5)
Monocytes Relative: 17.9 %
Neutro Abs: 2816 cells/uL (ref 1500–7800)
Neutrophils Relative %: 64 %
Platelets: 185 10*3/uL (ref 140–400)
RBC: 3.08 10*6/uL — ABNORMAL LOW (ref 4.20–5.80)
RDW: 12.4 % (ref 11.0–15.0)
Total Lymphocyte: 16.1 %
WBC: 4.4 10*3/uL (ref 3.8–10.8)

## 2020-03-19 LAB — LIPID PANEL
Cholesterol: 130 mg/dL (ref <200)
HDL: 48 mg/dL (ref >=40)
LDL Cholesterol (Calc): 69 mg/dL (calc)
Non-HDL Cholesterol (Calc): 82 mg/dL (calc) (ref <130)
Total CHOL/HDL Ratio: 2.7 (calc) (ref <5.0)
Triglycerides: 58 mg/dL (ref <150)

## 2020-03-19 LAB — IRON,TIBC AND FERRITIN PANEL
%SAT: 16 % (calc) — ABNORMAL LOW (ref 20–48)
Ferritin: 97 ng/mL (ref 24–380)
Iron: 40 ug/dL — ABNORMAL LOW (ref 50–180)
TIBC: 243 mcg/dL (calc) — ABNORMAL LOW (ref 250–425)

## 2020-03-19 LAB — COMPLETE METABOLIC PANEL WITH GFR
AG Ratio: 1.3 (calc) (ref 1.0–2.5)
ALT: 7 U/L — ABNORMAL LOW (ref 9–46)
AST: 17 U/L (ref 10–35)
Albumin: 3.9 g/dL (ref 3.6–5.1)
Alkaline phosphatase (APISO): 70 U/L (ref 35–144)
BUN/Creatinine Ratio: 21 (calc) (ref 6–22)
BUN: 39 mg/dL — ABNORMAL HIGH (ref 7–25)
CO2: 33 mmol/L — ABNORMAL HIGH (ref 20–32)
Calcium: 9.5 mg/dL (ref 8.6–10.3)
Chloride: 102 mmol/L (ref 98–110)
Creat: 1.82 mg/dL — ABNORMAL HIGH (ref 0.70–1.11)
GFR, Est African American: 37 mL/min/{1.73_m2} — ABNORMAL LOW (ref >=60)
GFR, Est Non African American: 32 mL/min/{1.73_m2} — ABNORMAL LOW (ref >=60)
Globulin: 3.1 g/dL (calc) (ref 1.9–3.7)
Glucose, Bld: 126 mg/dL — ABNORMAL HIGH (ref 65–99)
Potassium: 4.8 mmol/L (ref 3.5–5.3)
Sodium: 141 mmol/L (ref 135–146)
Total Bilirubin: 0.4 mg/dL (ref 0.2–1.2)
Total Protein: 7 g/dL (ref 6.1–8.1)

## 2020-03-19 LAB — HEMOGLOBIN A1C
Hgb A1c MFr Bld: 6 % of total Hgb — ABNORMAL HIGH (ref <5.7)
Mean Plasma Glucose: 126 (calc)
eAG (mmol/L): 7 (calc)

## 2020-03-19 MED ORDER — AMLODIPINE BESYLATE 5 MG PO TABS: 2.5000 mg | ORAL_TABLET | Freq: Every day | ORAL | 1 refills | Status: DC

## 2020-03-22 ENCOUNTER — Other Ambulatory Visit: Payer: Self-pay | Admitting: *Deleted

## 2020-03-22 DIAGNOSIS — D72819 Decreased white blood cell count, unspecified: Secondary | ICD-10-CM

## 2020-03-22 DIAGNOSIS — N1831 Chronic kidney disease, stage 3a: Secondary | ICD-10-CM

## 2020-03-22 DIAGNOSIS — D509 Iron deficiency anemia, unspecified: Secondary | ICD-10-CM

## 2020-03-30 ENCOUNTER — Telehealth: Payer: Self-pay | Admitting: Primary Care

## 2020-03-30 NOTE — Telephone Encounter (Signed)
Called daughter back to f/u with her to see if she spoke with her brother about Palliative services and to see if they wanted to pursue Palliative services for their Dad, no answer - left message with my name and contact number.

## 2020-04-15 ENCOUNTER — Telehealth: Payer: Self-pay | Admitting: Primary Care

## 2020-04-15 NOTE — Telephone Encounter (Signed)
Called daughter to f/u with her to see if she wanted to schedule the Palliative Consult, daughter was asleep and I woke her up when I called and she wanted to know if she could call me back, I told her I would be here until 4:30 - she said she had my number.

## 2020-04-18 ENCOUNTER — Other Ambulatory Visit: Payer: Self-pay

## 2020-04-18 ENCOUNTER — Inpatient Hospital Stay: Payer: Medicare PPO | Attending: Oncology

## 2020-04-18 ENCOUNTER — Inpatient Hospital Stay: Payer: Medicare PPO

## 2020-04-18 ENCOUNTER — Inpatient Hospital Stay (HOSPITAL_BASED_OUTPATIENT_CLINIC_OR_DEPARTMENT_OTHER): Payer: Medicare PPO | Admitting: Oncology

## 2020-04-18 ENCOUNTER — Encounter: Payer: Self-pay | Admitting: Oncology

## 2020-04-18 ENCOUNTER — Inpatient Hospital Stay: Payer: Medicare PPO | Admitting: Oncology

## 2020-04-18 VITALS — BP 125/69 | HR 68 | Temp 98.3°F | Resp 16 | Wt 131.9 lb

## 2020-04-18 DIAGNOSIS — N189 Chronic kidney disease, unspecified: Secondary | ICD-10-CM | POA: Diagnosis not present

## 2020-04-18 DIAGNOSIS — E785 Hyperlipidemia, unspecified: Secondary | ICD-10-CM | POA: Insufficient documentation

## 2020-04-18 DIAGNOSIS — Z79899 Other long term (current) drug therapy: Secondary | ICD-10-CM | POA: Diagnosis not present

## 2020-04-18 DIAGNOSIS — D709 Neutropenia, unspecified: Secondary | ICD-10-CM | POA: Insufficient documentation

## 2020-04-18 DIAGNOSIS — Z7982 Long term (current) use of aspirin: Secondary | ICD-10-CM | POA: Insufficient documentation

## 2020-04-18 DIAGNOSIS — Z955 Presence of coronary angioplasty implant and graft: Secondary | ICD-10-CM | POA: Insufficient documentation

## 2020-04-18 DIAGNOSIS — D72819 Decreased white blood cell count, unspecified: Secondary | ICD-10-CM

## 2020-04-18 DIAGNOSIS — N183 Chronic kidney disease, stage 3 unspecified: Secondary | ICD-10-CM | POA: Diagnosis not present

## 2020-04-18 DIAGNOSIS — E1151 Type 2 diabetes mellitus with diabetic peripheral angiopathy without gangrene: Secondary | ICD-10-CM | POA: Diagnosis not present

## 2020-04-18 DIAGNOSIS — I129 Hypertensive chronic kidney disease with stage 1 through stage 4 chronic kidney disease, or unspecified chronic kidney disease: Secondary | ICD-10-CM | POA: Insufficient documentation

## 2020-04-18 DIAGNOSIS — Z87891 Personal history of nicotine dependence: Secondary | ICD-10-CM | POA: Diagnosis not present

## 2020-04-18 DIAGNOSIS — Z7984 Long term (current) use of oral hypoglycemic drugs: Secondary | ICD-10-CM | POA: Insufficient documentation

## 2020-04-18 DIAGNOSIS — E7800 Pure hypercholesterolemia, unspecified: Secondary | ICD-10-CM | POA: Insufficient documentation

## 2020-04-18 DIAGNOSIS — D631 Anemia in chronic kidney disease: Secondary | ICD-10-CM | POA: Diagnosis not present

## 2020-04-18 DIAGNOSIS — N1831 Chronic kidney disease, stage 3a: Secondary | ICD-10-CM

## 2020-04-18 DIAGNOSIS — E1122 Type 2 diabetes mellitus with diabetic chronic kidney disease: Secondary | ICD-10-CM | POA: Diagnosis not present

## 2020-04-18 LAB — CBC WITH DIFFERENTIAL/PLATELET
Abs Immature Granulocytes: 0.02 10*3/uL (ref 0.00–0.07)
Basophils Absolute: 0 10*3/uL (ref 0.0–0.1)
Basophils Relative: 0 %
Eosinophils Absolute: 0.1 10*3/uL (ref 0.0–0.5)
Eosinophils Relative: 1 %
HCT: 33.8 % — ABNORMAL LOW (ref 39.0–52.0)
Hemoglobin: 10.7 g/dL — ABNORMAL LOW (ref 13.0–17.0)
Immature Granulocytes: 0 %
Lymphocytes Relative: 11 %
Lymphs Abs: 0.8 10*3/uL (ref 0.7–4.0)
MCH: 32.6 pg (ref 26.0–34.0)
MCHC: 31.7 g/dL (ref 30.0–36.0)
MCV: 103 fL — ABNORMAL HIGH (ref 80.0–100.0)
Monocytes Absolute: 0.9 10*3/uL (ref 0.1–1.0)
Monocytes Relative: 13 %
Neutro Abs: 5.1 10*3/uL (ref 1.7–7.7)
Neutrophils Relative %: 75 %
Platelets: 180 10*3/uL (ref 150–400)
RBC: 3.28 MIL/uL — ABNORMAL LOW (ref 4.22–5.81)
RDW: 13.2 % (ref 11.5–15.5)
WBC: 6.8 10*3/uL (ref 4.0–10.5)
nRBC: 0 % (ref 0.0–0.2)

## 2020-04-18 LAB — FERRITIN: Ferritin: 49 ng/mL (ref 24–336)

## 2020-04-18 LAB — IRON AND TIBC
Iron: 89 ug/dL (ref 45–182)
Saturation Ratios: 33 % (ref 17.9–39.5)
TIBC: 267 ug/dL (ref 250–450)
UIBC: 178 ug/dL

## 2020-04-18 NOTE — Progress Notes (Signed)
Pt feels weak, if he gets up he feels some better. He states that he eats 3 meals a day and snack. Drinks ensure wither 2-3 times a day lost 5 lbs from a month from today.

## 2020-04-20 ENCOUNTER — Encounter: Payer: Self-pay | Admitting: Family Medicine

## 2020-04-20 ENCOUNTER — Telehealth: Payer: Self-pay | Admitting: Primary Care

## 2020-04-20 NOTE — Telephone Encounter (Signed)
Spoke with daughter again to see if they wanted to schedule the Palliative Consult, daughter asked me again what Palliative services were and so I explained this to her and answered all her questions.  Daughter wanted to discuss this with the patient as well as with her brother first.  I also told her that I had emailed her a Palliative brochure back on 03/18/20 for her to review.  I asked daughter to call me back by this Friday to let me know what they have decided to do and she agreed to do this.

## 2020-04-21 ENCOUNTER — Encounter: Payer: Self-pay | Admitting: Oncology

## 2020-04-21 ENCOUNTER — Encounter: Payer: Self-pay | Admitting: Family Medicine

## 2020-04-21 ENCOUNTER — Other Ambulatory Visit: Payer: Self-pay

## 2020-04-21 ENCOUNTER — Ambulatory Visit (INDEPENDENT_AMBULATORY_CARE_PROVIDER_SITE_OTHER): Payer: Medicare PPO | Admitting: Family Medicine

## 2020-04-21 VITALS — BP 128/62 | HR 40 | Temp 97.8°F | Resp 16 | Ht 66.0 in | Wt 134.0 lb

## 2020-04-21 DIAGNOSIS — N183 Chronic kidney disease, stage 3 unspecified: Secondary | ICD-10-CM

## 2020-04-21 DIAGNOSIS — D709 Neutropenia, unspecified: Secondary | ICD-10-CM | POA: Diagnosis not present

## 2020-04-21 DIAGNOSIS — E1122 Type 2 diabetes mellitus with diabetic chronic kidney disease: Secondary | ICD-10-CM

## 2020-04-21 DIAGNOSIS — E46 Unspecified protein-calorie malnutrition: Secondary | ICD-10-CM | POA: Diagnosis not present

## 2020-04-21 DIAGNOSIS — N185 Chronic kidney disease, stage 5: Secondary | ICD-10-CM

## 2020-04-21 DIAGNOSIS — I10 Essential (primary) hypertension: Secondary | ICD-10-CM | POA: Diagnosis not present

## 2020-04-21 DIAGNOSIS — N2581 Secondary hyperparathyroidism of renal origin: Secondary | ICD-10-CM

## 2020-04-21 DIAGNOSIS — D689 Coagulation defect, unspecified: Secondary | ICD-10-CM

## 2020-04-21 MED ORDER — ENSURE ACTIVE HIGH PROTEIN PO LIQD: 1.0000 | Freq: Two times a day (BID) | ORAL | 5 refills | Status: AC

## 2020-04-21 NOTE — Patient Instructions (Signed)
Check with your insurance to see if they cover nutritional supplements for protein calorie malnutrition -  Try boost or ensure 2 drinks a day in ADDITION to 3 other well balanced meals  We will recheck your labs and diabetes in November.  I would focus on good nutrition and enough calories to maintain weight, and do not worry as much about blood sugars.  Check sugar if you suspect it is high or low, but you do not have to check daily, we will monitor that for you and it is well controlled.   Protein-Energy Malnutrition Protein-energy malnutrition is when a person does not eat enough protein, fat, and calories. When this happens over time, it can lead to severe loss of muscle tissue (muscle wasting). This condition also affects the body's defense system (immune system) and can lead to other health problems. What are the causes? This condition may be caused by:  Not eating enough protein, fat, or calories.  Having certain chronic medical conditions.  Eating too little. What increases the risk? The following factors may make you more likely to develop this condition:  Living in poverty.  Long-term hospitalization.  Alcohol or drug dependency. Addiction often leads to a lifestyle in which proper diet is ignored. Dependency can also hurt the metabolism and the body's ability to absorb nutrients.  Eating disorders, such as anorexia nervosa or bulimia.  Chewing or swallowing problems. People with these disorders may not eat enough.  Having certain conditions, such as: ? Inflammatory bowel disease. Inflammation of the intestines makes it difficult for the body to absorb nutrients. ? Cancer or AIDS. These diseases can cause a loss of appetite. ? Chronic heart failure. This interferes with how the body uses nutrients. ? Cystic fibrosis. This disease can make it difficult for the body to absorb nutrients.  Eating a diet that extremely restricts protein, fat, or calorie intake. What are the  signs or symptoms? Symptoms of this condition include:  Fatigue.  Weakness.  Dizziness.  Fainting.  Weight loss.  Loss of muscle tone and muscle mass.  Poor immune response.  Lack of menstruation.  Poor memory.  Hair loss.  Skin changes. How is this diagnosed? This condition may be diagnosed based on:  Your medical and dietary history.  A physical exam. This may include a measurement of your body mass index (BMI).  Blood tests. How is this treated? This condition may be managed with:  Nutrition therapy. This may include working with a diet and nutrition specialist (dietitian).  Treatment for underlying conditions. People with severe protein-energy malnutrition may need to be treated in a hospital. This may involve receiving nutrition and fluids through an IV. Follow these instructions at home:   Eat a balanced diet. In each meal, include at least one food that is high in protein. Foods that are high in protein include: ? Meat. ? Poultry. ? Fish. ? Eggs. ? Cheese. ? Milk. ? Beans. ? Nuts.  Eat nutrient-rich foods that are easy to swallow and digest, such as: ? Fruit and yogurt smoothies. ? Oatmeal with nut butter.  Try to eat six small meals each day instead of three large meals.  Take vitamin and protein supplements as told by your health care provider or dietitian.  Follow your health care provider's recommendations about exercise and activity.  Keep all follow-up visits as told by your health care provider. This is important. Contact a health care provider if you:  Have increased weakness or fatigue.  Faint.  Are  a woman and you stop having your period (menstruating).  Have rapid hair loss.  Have unexpected weight loss.  Have diarrhea.  Have nausea and vomiting. Get help right away if you have:  Difficulty breathing.  Chest pain. Summary  Protein-energy malnutrition is when a person does not eat enough protein, fat, and  calories.  Protein-energy malnutrition can lead to severe loss of muscle tissue (muscle wasting). This condition also affects the body's defense system (immune system) and can lead to other health problems.  Talk with your health care provider about treatment for this condition. Effective treatment depends on the underlying cause of the malnutrition. This information is not intended to replace advice given to you by your health care provider. Make sure you discuss any questions you have with your health care provider. Document Revised: 10/30/2017 Document Reviewed: 10/30/2017 Elsevier Patient Education  2020 [employer].

## 2020-04-21 NOTE — Progress Notes (Signed)
Patient ID: Samuel Wolters., male    DOB: 1927-08-28, 86 y.o.   MRN: [id number]  PCP: Samuel Grana, PA-C  Chief Complaint  Patient presents with  . Follow-up  . Weight Check  . Hypertension    Subjective:   Samuel Simonetti. is a 86 y.o. male, presents to clinic with CC of the following:  F/up on BP and weight recheck Previously BP was low with hypotensive episodes here in clinic and at home - since last visit no lightheaded episodes or syncope Cardiology changed BP meds due to high readings in office, pt presented about 2 weeks ago with low BP in office and at home, amlodipine was changed to 5 mg daily, now they say nephrology and cardiology have advised them to take 2.5 mg and not change meds unless instructed by them.   BP reading today in clinic well controlled, improved from prior Daughter brings in multiple BP readings scribbled on various papers BP Readings from Last 3 Encounters:  04/21/20 128/62  04/18/20 125/69  03/18/20 (!) 92/52   Weight f/up Pt has lost about 10 lbs since one year ago, down 2 lbs since our last OV He reports eating more and feeling a little better and having more energy. Wt Readings from Last 5 Encounters:  04/21/20 134 lb (60.8 kg)  04/18/20 131 lb 14.4 oz (59.8 kg)  03/18/20 136 lb 4.8 oz (61.8 kg)  01/12/20 160 lb (72.6 kg)  01/07/20 140 lb (63.5 kg)   BMI Readings from Last 5 Encounters:  04/21/20 21.63 kg/m  04/18/20 21.29 kg/m  03/18/20 22.00 kg/m  01/12/20 25.82 kg/m  01/07/20 22.60 kg/m   Pt is here with his daughter again, discussed nutrition/calories again, discussed palliative care referral/nutritionists referral.  Patient Active Problem List   Diagnosis Date Noted  . Benign hypertensive kidney disease with chronic kidney disease 01/06/2020  . Secondary hyperparathyroidism of renal origin ([place]) 07/13/2019  . Iron deficiency anemia 11/24/2018  . Aortic atherosclerosis ([place]) 07/28/2018  . Cholelithiasis 07/28/2018  .  Pancreatic lesion 07/28/2018  . Chronic mesenteric ischemia ([place]) 07/28/2018  . Full code status 01/23/2018  . Memory loss 01/23/2018  . Carotid stenosis, asymptomatic, left 12/18/2017  . Degenerative arthritis of left shoulder region 08/19/2017  . Microalbuminuria 08/19/2017  . Type 2 diabetes mellitus ([place]) 08/13/2017  . Preventative health care 12/11/2016  . Abdominal mass 04/11/2016  . Abdominal mass, right upper quadrant 04/03/2016  . Bilateral carotid bruits 04/03/2016  . Facial injury 10/26/2015  . 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 Arteriosclerosis of coronary artery 10/11/2015  . H/O angina pectoris 10/11/2015  . Hyperlipidemia 09/13/2015  . Chronic kidney disease, stage III (moderate) ([place]) 09/13/2015  . RENAL ATHEROSCLEROSIS 03/16/2009  . Essential hypertension 03/15/2009  . Coronary atherosclerosis 03/15/2009  . Carotid arterial disease ([place]) 03/15/2009  . Peripheral vascular disease ([place]) 03/15/2009  . Anemia in chronic kidney disease (CKD) 03/15/2009      Current Outpatient Medications:  .  amLODipine (NORVASC) 5 MG tablet, Take 0.5 tablets (2.5 mg total) by mouth daily., Disp: 30 tablet, Rfl: 1 .  aspirin EC 81 MG tablet, Take 81 mg by mouth daily. , Disp: , Rfl:  .  atorvastatin (LIPITOR) 40 MG tablet, Take 1 tablet (40 mg total) by mouth at bedtime., Disp: 90 tablet, Rfl: 1 .  Cholecalciferol (VITAMIN D3) 1.25 MG (50000 UT) TABS, Take 1 tablet by mouth daily., Disp: , Rfl:  .  clopidogrel (PLAVIX) 75 MG tablet, TAKE 1  TABLET DAILY. (Patient taking differently: Take 75 mg by mouth daily. ), Disp: 90 tablet, Rfl: 0 .  docusate sodium (COLACE) 100 MG capsule, Take 100 mg by mouth daily as needed for mild constipation., Disp: , Rfl:  .  ferrous sulfate 325 (65 FE) MG EC tablet, Take 325 mg by mouth daily. , Disp: , Rfl:  .  furosemide (LASIX) 40 MG tablet, Take 40 mg by mouth daily. , Disp: , Rfl:  .  isosorbide mononitrate (IMDUR) 30 MG 24 hr tablet, Take 1  tablet (30 mg total) by mouth daily., Disp: , Rfl:  .  latanoprost (XALATAN) 0.005 % ophthalmic solution, Place 1 drop into both eyes at bedtime., Disp: , Rfl:  .  linagliptin (TRADJENTA) 5 MG TABS tablet, Take 1 tablet (5 mg total) by mouth daily. For diabetes, Disp: 90 tablet, Rfl: 3 .  losartan (COZAAR) 100 MG tablet, Take 100 mg by mouth daily., Disp: , Rfl:  .  Multiple Vitamin (MULTIVITAMIN) tablet, Take 1 tablet by mouth daily. , Disp: , Rfl:  .  timolol (BETIMOL) 0.5 % ophthalmic solution, Place 1 drop into both eyes 2 (two) times daily., Disp: 10 mL, Rfl: 12 .  vitamin B-12 (CYANOCOBALAMIN) 1000 MCG tablet, Take 1,000 mcg by mouth daily., Disp: , Rfl:    No Known Allergies   Social History   Tobacco Use  . Smoking status: Former Smoker    Types: Cigarettes    Quit date: 10/29/1970    Years since quitting: 49.5  . Smokeless tobacco: Never Used  Vaping Use  . Vaping Use: Never used  Substance Use Topics  . Alcohol use: No    Alcohol/week: 0.0 standard drinks  . Drug use: No      Chart Review Today: I personally reviewed active problem list, medication list, allergies, family history, social history, health maintenance, notes from last encounter, lab results, imaging with the patient/caregiver today.   Review of Systems 10 Systems reviewed and are negative for acute change except as noted in the HPI.     Objective:   Vitals:   04/21/20 0859  BP: 128/62  Pulse: (!) 40  Resp: 16  Temp: 97.8 F (36.6 C)  TempSrc: Temporal  SpO2: 98%  Weight: 134 lb (60.8 kg)  Height: 5\' 6"  (1.676 m)    Body mass index is 21.63 kg/m.  Physical Exam Vitals and nursing note reviewed.  Constitutional:      General: He is not in acute distress.    Appearance: Normal appearance. He is well-developed. He is not ill-appearing, toxic-appearing or diaphoretic.     Comments: thin appearing elderly male, well-appearing, alert, no acute distress, hard of hearing  HENT:     Head:  Normocephalic and atraumatic.  Eyes:     General:        Right eye: No discharge.        Left eye: No discharge.     Conjunctiva/sclera: Conjunctivae normal.  Neck:     Trachea: No tracheal deviation.  Cardiovascular:     Rate and Rhythm: Regular rhythm.     Pulses: Normal pulses.     Heart sounds: Normal heart sounds.  Pulmonary:     Effort: Pulmonary effort is normal. No respiratory distress.     Breath sounds: Normal breath sounds. No stridor. No wheezing or rhonchi.  Abdominal:     General: Bowel sounds are normal. There is no distension.     Tenderness: There is no abdominal tenderness.  Musculoskeletal:  Right lower leg: No edema.     Left lower leg: No edema.  Skin:    General: Skin is warm and dry.     Coloration: Skin is not jaundiced or pale.     Findings: No bruising or rash.  Neurological:     Mental Status: He is alert. Mental status is at baseline.     Motor: No abnormal muscle tone.  Psychiatric:        Mood and Affect: Mood normal.        Behavior: Behavior normal.      Results for orders placed or performed in visit on 04/18/20  Iron and TIBC  Result Value Ref Range   Iron 89 45 - 182 ug/dL   TIBC 267 250 - 450 ug/dL   Saturation Ratios 33 17.9 - 39.5 %   UIBC 178 ug/dL  Ferritin  Result Value Ref Range   Ferritin 49 24 - 336 ng/mL  CBC with Differential/Platelet  Result Value Ref Range   WBC 6.8 4.0 - 10.5 K/uL   RBC 3.28 (L) 4.22 - 5.81 MIL/uL   Hemoglobin 10.7 (L) 13.0 - 17.0 g/dL   HCT 33.8 (L) 39 - 52 %   MCV 103.0 (H) 80.0 - 100.0 fL   MCH 32.6 26.0 - 34.0 pg   MCHC 31.7 30.0 - 36.0 g/dL   RDW 13.2 11.5 - 15.5 %   Platelets 180 150 - 400 K/uL   nRBC 0.0 0.0 - 0.2 %   Neutrophils Relative % 75 %   Neutro Abs 5.1 1.7 - 7.7 K/uL   Lymphocytes Relative 11 %   Lymphs Abs 0.8 0.7 - 4.0 K/uL   Monocytes Relative 13 %   Monocytes Absolute 0.9 0 - 1 K/uL   Eosinophils Relative 1 %   Eosinophils Absolute 0.1 0 - 0 K/uL   Basophils  Relative 0 %   Basophils Absolute 0.0 0 - 0 K/uL   Immature Granulocytes 0 %   Abs Immature Granulocytes 0.02 0.00 - 0.07 K/uL       Assessment & Plan:     ICD-10-CM   1. Essential hypertension  I10    BP at goal, less hypotensive episodes with decrease dose of new meds - per cardiology - they do not want [place] changing meds, pt encouraged to f/up with them  2. Type 2 diabetes mellitus with stage 3 chronic kidney disease, without long-term current use of insulin, unspecified whether stage 3a or 3b CKD ([place])  E11.22    N18.30    discussed DM goals for pt age, again encouraged nutrition and not loosing weight as goal more important than tight glycemic control  3. Protein-calorie malnutrition, unspecified severity ([place])  E46 Nutritional Supplements (ENSURE ACTIVE HIGH PROTEIN) LIQD   discussed calories and nutrition again, encouraged ensure or boost added to 3 meals again, again declined nutritionist referral or palliative care referral  4. Neutropenia, unspecified type ([place])  D70.9    per hematology  5. Type 2 diabetes mellitus with stage 5 chronic kidney disease not on chronic dialysis, without long-term current use of insulin [facility]) Chronic E11.22    N18.5    sees nephrology   6. Secondary hyperparathyroidism of renal origin ([place]) Chronic N25.81    per nephrology  7. Coagulation defect ([place]) Chronic D68.9    per hematology, no current bleeding concerns or sx, recent labs reviewed, on plavix and ASA   Printed Rx for ensure per pt's daughters request - but explained that  they will need to call and see what insurance covers as far as supplements.  I again attempted to explain the role and benefit of palliative care services and a nutritional referral that would be per insurance with pts other dx, but the daughter refused.  The pt is very HOH, daughter tends to make most of the decisions for him    Pt with answer direct questions with few answers - such as answer that appetite is "good" or yes  or no answers to direct questions.  He cannot provide much history and does not give details or hx.  Nearly all history is provided by his daughter. Pt is relatively new to me and when asked to return sooner with my concerns of weight loss, the pt daughter refused and they returned 6 months later.  They have refused all referrals and recommendations that I have given.  I would like SW/palliative care and even neurology to assist in pt's care and assessment.  I am not sure if he has POA? And it is very difficult to make progress with discussing goals of care with the pt and his daughter.  Will discuss with Supervision Physician Dr. Ancil Boozer and see if they can have a f/up with MD    With further chart review was able to find prior PCP medicare visit from 2019 with following info: Advanced Directives Does patient have a HCPOA?    yes If yes, name and contact information: Rider Ermis, telephone [phone] Does patient have a living will or MOST form?  yes  Full code; but would not want to be left on machines if terminal and no hope  Will need to call and see if Trilby Drummer can come to f/up appt - since daughter comes but is not designated at [facility] and has not been receptive to any of my medical recommendations.  Samuel Grana, PA-C 04/21/20 9:32 AM

## 2020-04-21 NOTE — Progress Notes (Signed)
Hematology/Oncology Consult note [facility]  Telephone:([phone] [phone]  Patient Care Team: Delsa Grana, PA-C as PCP - General (Family Medicine) Lavonia Dana, MD as Consulting Physician (Internal Medicine) Yolonda Kida, MD as Consulting Physician (Cardiology) Anell Barr, OD (Optometry) Sanda Klein Satira Anis, MD as Attending Physician (Family Medicine) Bary Castilla Forest Gleason, MD (General Surgery) Delana Meyer, Dolores Lory, MD (Vascular Surgery)   Name of the patient: Samuel Bryan  [id number]  Jul 28, 1927   Date of visit: 04/21/20  Diagnosis- 1.  Leukopenia/neutropenia likely benign 2.  Normocytic anemia likely secondary to chronic kidney disease   Chief complaint/ Reason for visit- routine follow-up of anemia and leukopenia  Heme/Onc history: Patient is a 86 year old African-American male with a past medical history significant for CKD stage III, peripheral vascular disease, hypertension hyperlipidemia among other medical problems.He has been referred to [place] for evaluation of leukopenia. Recent CBC from 10/28/2018 showed white count of 3.7, H&H of 10.4/31.6 and a platelet count of 250. Looking back at his prior white count patient has had a few low values of 3.41 dating back to 2013 and his white count waxes and wanes. His hemoglobin of late has been around 10 likely secondary to his chronic kidney disease. He is active for his age and lives with his daughter. He is independent of his ADLs. Reports that he has a good appetite and denies any unintentional weight loss. Denies any recurrent infections or hospitalizations.  Interval history- Patient is here with his daughter today.  His appetite has been poor and he has been losing weight.  He has lost 9 pounds in the last 3 months.  He remains active and independent of his ADLs and IADLs.  He mows his own lawn  ECOG PS- 1 Pain scale- 0   Review of systems- Review of Systems   Constitutional: Positive for malaise/fatigue. Negative for chills, fever and weight loss.  HENT: Negative for congestion, ear discharge and nosebleeds.   Eyes: Negative for blurred vision.  Respiratory: Negative for cough, hemoptysis, sputum production, shortness of breath and wheezing.   Cardiovascular: Negative for chest pain, palpitations, orthopnea and claudication.  Gastrointestinal: Negative for abdominal pain, blood in stool, constipation, diarrhea, heartburn, melena, nausea and vomiting.  Genitourinary: Negative for dysuria, flank pain, frequency, hematuria and urgency.  Musculoskeletal: Negative for back pain, joint pain and myalgias.  Skin: Negative for rash.  Neurological: Negative for dizziness, tingling, focal weakness, seizures, weakness and headaches.  Endo/Heme/Allergies: Does not bruise/bleed easily.  Psychiatric/Behavioral: Negative for depression and suicidal ideas. The patient does not have insomnia.       No Known Allergies   Past Medical History:  Diagnosis Date  . Anemia   . Atherosclerosis of renal artery ([place])   . Essential hypertension, benign   . Occlusion and stenosis of carotid artery without mention of cerebral infarction    moderate left ICA stenosis  . Peripheral vascular disease, unspecified ([place])    mild lifestyle limiting claudication  . Pure hypercholesterolemia   . Renal insufficiency   . Type 2 diabetes mellitus ([place])      Past Surgical History:  Procedure Laterality Date  . APPENDECTOMY  1970's  . COLONOSCOPY    . coronary artery stenting  1999   at [facility]   . ENDARTERECTOMY Left 12/18/2017   Procedure: ENDARTERECTOMY CAROTID WITH PATCH;  Surgeon: Delana Meyer Dolores Lory, MD;  Location: [facility] ORS;  Service: Vascular;  Laterality: Left;  . FACIAL LACERATION REPAIR Left 12/13/2015  performed at [facility] by Dr. Marcial Pacas  . renal angiography    . VISCERAL ANGIOGRAPHY N/A 08/19/2018   Procedure: VISCERAL ANGIOGRAPHY;  Surgeon: Katha Cabal, MD;  Location: [place] [facility];  Service: Cardiovascular;  Laterality: N/A;    Social History   Socioeconomic History  . Marital status: Widowed    Spouse name: Not on file  . Number of children: 5  . Years of education: 10  . Highest education level: Not on file  Occupational History  . Occupation: retired  Tobacco Use  . Smoking status: Former Smoker    Types: Cigarettes    Quit date: 10/29/1970    Years since quitting: 49.5  . Smokeless tobacco: Never Used  Vaping Use  . Vaping Use: Never used  Substance and Sexual Activity  . Alcohol use: No    Alcohol/week: 0.0 standard drinks  . Drug use: No  . Sexual activity: Not Currently  Other Topics Concern  . Not on file  Social History Narrative   widowed, all children are healthy.    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    Social Determinants of Health   Financial Resource Strain:   . Difficulty of Paying Living Expenses:   Food Insecurity:   . Worried About Charity fundraiser in the Last Year:   . Arboriculturist in the Last Year:   Transportation Needs:   . Film/video editor (Medical):   Marland Kitchen Lack of Transportation (Non-Medical):   Physical Activity: Sufficiently Active  . Days of Exercise per Week: 5 days  . Minutes of Exercise per Session: 60 min  Stress:   . Feeling of Stress :   Social Connections:   . Frequency of Communication with Friends and Family:   . Frequency of Social Gatherings with Friends and Family:   . Attends Religious Services:   . Active Member of Clubs or Organizations:   . Attends Archivist Meetings:   Marland Kitchen Marital Status:   Intimate Partner Violence:   . Fear of Current or Ex-Partner:   . Emotionally Abused:   Marland Kitchen Physically Abused:   . Sexually Abused:     Family History  Problem Relation Age of Onset  . Diabetes Mother   . Heart disease Mother   . Hypertension Brother   . Hyperlipidemia Brother   . Diabetes  Brother   . Hypertension Son   . Hypertension Son   . Birth defects Neg Hx   . Stroke Neg Hx      Current Outpatient Medications:  .  amLODipine (NORVASC) 5 MG tablet, Take 0.5 tablets (2.5 mg total) by mouth daily., Disp: 30 tablet, Rfl: 1 .  aspirin EC 81 MG tablet, Take 81 mg by mouth daily. , Disp: , Rfl:  .  atorvastatin (LIPITOR) 40 MG tablet, Take 1 tablet (40 mg total) by mouth at bedtime., Disp: 90 tablet, Rfl: 1 .  Cholecalciferol (VITAMIN D3) 1.25 MG (50000 UT) TABS, Take 1 tablet by mouth daily., Disp: , Rfl:  .  clopidogrel (PLAVIX) 75 MG tablet, TAKE 1 TABLET DAILY. (Patient taking differently: Take 75 mg by mouth daily. ), Disp: 90 tablet, Rfl: 0 .  docusate sodium (COLACE) 100 MG capsule, Take 100 mg by mouth daily as needed for mild constipation., Disp: , Rfl:  .  ferrous sulfate 325 (65 FE) MG EC tablet, Take 325 mg by mouth daily. , Disp: , Rfl:  .  furosemide (LASIX) 40 MG tablet, Take 40 mg by mouth daily. , Disp: , Rfl:  .  isosorbide mononitrate (IMDUR) 30 MG 24 hr tablet, Take 1 tablet (30 mg total) by mouth daily., Disp: , Rfl:  .  latanoprost (XALATAN) 0.005 % ophthalmic solution, Place 1 drop into both eyes at bedtime., Disp: , Rfl:  .  linagliptin (TRADJENTA) 5 MG TABS tablet, Take 1 tablet (5 mg total) by mouth daily. For diabetes, Disp: 90 tablet, Rfl: 3 .  losartan (COZAAR) 100 MG tablet, Take 100 mg by mouth daily., Disp: , Rfl:  .  vitamin B-12 (CYANOCOBALAMIN) 1000 MCG tablet, Take 1,000 mcg by mouth daily., Disp: , Rfl:  .  Multiple Vitamin (MULTIVITAMIN) tablet, Take 1 tablet by mouth daily. , Disp: , Rfl:  .  Nutritional Supplements (ENSURE ACTIVE HIGH PROTEIN) LIQD, Take 1 Can by mouth in the morning and at bedtime., Disp: 14220 mL, Rfl: 5 .  timolol (BETIMOL) 0.5 % ophthalmic solution, Place 1 drop into both eyes 2 (two) times daily., Disp: 10 mL, Rfl: 12  Physical exam:  Vitals:   04/18/20 1448  BP: 125/69  Pulse: 68  Resp: 16  Temp: 98.3 F  (36.8 C)  TempSrc: Tympanic  Weight: 131 lb 14.4 oz (59.8 kg)   Physical Exam Constitutional:      General: He is not in acute distress. Cardiovascular:     Rate and Rhythm: Normal rate and regular rhythm.     Heart sounds: Normal heart sounds.  Pulmonary:     Effort: Pulmonary effort is normal.     Breath sounds: Normal breath sounds.  Abdominal:     General: Bowel sounds are normal.     Palpations: Abdomen is soft.  Lymphadenopathy:     Comments: No palpable cervical, supraclavicular, axillary or inguinal adenopathy   Skin:    General: Skin is warm and dry.  Neurological:     Mental Status: He is alert and oriented to person, place, and time.      CMP Latest Ref Rng & Units 03/18/2020  Glucose 65 - 99 mg/dL 126(H)  BUN 7 - 25 mg/dL 39(H)  Creatinine 0.70 - 1.11 mg/dL 1.82(H)  Sodium 135 - 146 mmol/L 141  Potassium 3.5 - 5.3 mmol/L 4.8  Chloride 98 - 110 mmol/L 102  CO2 20 - 32 mmol/L 33(H)  Calcium 8.6 - 10.3 mg/dL 9.5  Total Protein 6.1 - 8.1 g/dL 7.0  Total Bilirubin 0.2 - 1.2 mg/dL 0.4  Alkaline Phos 38 - 126 U/L -  AST 10 - 35 U/L 17  ALT 9 - 46 U/L 7(L)   CBC Latest Ref Rng & Units 04/18/2020  WBC 4.0 - 10.5 K/uL 6.8  Hemoglobin 13.0 - 17.0 g/dL 10.7(L)  Hematocrit 39 - 52 % 33.8(L)  Platelets 150 - 400 K/uL 180     Assessment and plan- Patient is a 86 y.o. male with anemia and leukopenia and ongoing weight loss  1. Leukopenia: White cell count waxes and wanes between 3 to normal.  Today at 6.8.  Differential in the past has shown mild neutropenia and lymphopenia.  This is relatively stable.  Platelet counts are normal.  Hemoglobin has been between 10-11 since the last 4 years and is essentially stable.  There is also probably a component of chronic kidney disease but age-related MDS given macrocytosis is a possibility as well.  2.  Weight loss:Patient recently had angio abdomen pelvis CT which did not show any evidence of acute pathology  or malignancy.  We  discussed getting CT chest and weight loss continues.  At this time patient and family would like to hold off  I will see him back in 6 months with CBC CMP ferritin iron studies B12 and folate   Visit Diagnosis 1. Anemia in chronic kidney disease, unspecified CKD stage   2. Leukopenia, unspecified type      Dr. Randa Evens, MD, MPH [facility] [phone] 04/21/2020 1:45 PM

## 2020-04-27 ENCOUNTER — Telehealth: Payer: Self-pay | Admitting: Primary Care

## 2020-04-27 NOTE — Telephone Encounter (Signed)
Called daughter back to see if she spoke with patient and brother about Palliative services and to see what they had decided to do, no answer.  Left message requesting a call back by this Friday to let us know if they wish to pursue Palliative services or not and if I did not hear back that we would cancel the referral and notify MD.  Left my name and contact number requesting a return call.

## 2020-05-23 ENCOUNTER — Other Ambulatory Visit: Payer: Medicare PPO

## 2020-05-24 ENCOUNTER — Telehealth: Payer: Medicare PPO | Admitting: Oncology

## 2020-05-28 ENCOUNTER — Encounter: Payer: Self-pay | Admitting: Family Medicine

## 2020-05-28 DIAGNOSIS — D709 Neutropenia, unspecified: Secondary | ICD-10-CM | POA: Insufficient documentation

## 2020-06-16 NOTE — Progress Notes (Signed)
Patient: Samuel Bryan., Male    DOB: 18-Oct-1927, 86 y.o.   MRN: [id number] Delsa Grana, PA-C Visit Date: 06/17/2020  Today's Provider: Delsa Grana, PA-C   Chief Complaint  Patient presents with  . Annual Exam   Subjective:   Annual physical exam:  Samuel Bryan. is a 86 y.o. male who presents today for health maintenance and annual & complete physical exam.   Exercise/Activity:  mows and works on farm  Diet/nutrition:  healthy  Sleep: 7hrs per night  USPSTF grade A and B recommendations - reviewed and addressed today  Depression:  Phq 9 completed today by patient, was reviewed by me with patient in the room, score is negative, pt feels well/mood good PHQ 2/9 Scores 06/17/2020 04/21/2020 03/18/2020 09/18/2019  PHQ - 2 Score 0 0 0 0  PHQ- 9 Score 2 0 0 0   Depression screen [facility] 2/9 06/17/2020 04/21/2020 03/18/2020 09/18/2019 06/17/2019  Decreased Interest 0 0 0 0 0  Down, Depressed, Hopeless 0 0 0 0 0  PHQ - 2 Score 0 0 0 0 0  Altered sleeping 0 0 0 0 0  Tired, decreased energy 1 0 0 0 0  Change in appetite 1 0 0 0 0  Feeling bad or failure about yourself  0 0 0 0 0  Trouble concentrating 0 0 0 0 0  Moving slowly or fidgety/restless 0 0 0 0 0  Suicidal thoughts 0 0 0 0 0  PHQ-9 Score 2 0 0 0 0  Difficult doing work/chores Not difficult at all Not difficult at all Not difficult at all Not difficult at all Not difficult at all  Some recent data might be hidden   Hep C Screening: declines STD testing and prevention (HIV/chl/gon/syphilis): 11/10/2018 Intimate partner violence:  Widowed, safe at home, daughter does a lot of his care, but son is POA  Prostate cancer: psa not indicated per age No results found for: PSA   Advanced Care Planning:  Advanced Care Planning: A voluntary discussion about advance care planning including the explanation and discussion of advance directives.  Discussed health care proxy and Living will, and the patient was able to identify a  health care proxy as Son Errik Mitchelle.  Patient does have a living will at present time. If patient does have living will, I have requested they bring this to the clinic to be scanned in to their chart.  Health Maintenance  Topic Date Due  . HEMOGLOBIN A1C  09/18/2020  . OPHTHALMOLOGY EXAM  03/01/2021  . FOOT EXAM  06/17/2021  . TETANUS/TDAP  06/26/2021  . COVID-19 Vaccine  Completed  . PNA vac Low Risk Adult  Completed  . INFLUENZA VACCINE  Discontinued   Skin cancer:  Pt reports no hx of skin cancer, suspicious lesions/biopsies in the past.  Colorectal cancer:  colonoscopy no longer recommend routine screening, no changes in bowel movement Pt denies melena, hematochezia  Lung cancer:  Low Dose CT Chest recommended if Age 38-80 years, 20 pack-year currently smoking OR have quit w/in 15years. Patient does not qualify.   Social History   Tobacco Use  . Smoking status: Former Smoker    Types: Cigarettes    Quit date: 10/29/1970    Years since quitting: 49.6  . Smokeless tobacco: Never Used  Substance Use Topics  . Alcohol use: No    Alcohol/week: 0.0 standard drinks     Alcohol screening:   Office Visit from 06/17/2020 in [facility]  AUDIT-C Score 0      AAA:n/a The USPSTF recommends one-time screening with ultrasonography in men ages 66 to 23 years who have ever smoked  ECG: not indicated  Blood pressure/Hypertension: BP Readings from Last 3 Encounters:  06/17/20 138/82  04/21/20 128/62  04/18/20 125/69   Weight/Obesity: Wt Readings from Last 3 Encounters:  06/17/20 131 lb 6.4 oz (59.6 kg)  04/21/20 134 lb (60.8 kg)  04/18/20 131 lb 14.4 oz (59.8 kg)   BMI Readings from Last 3 Encounters:  06/17/20 21.21 kg/m  04/21/20 21.63 kg/m  04/18/20 21.29 kg/m    Lipids:  Lab Results  Component Value Date   CHOL 130 03/18/2020   CHOL 148 07/13/2019   CHOL 122 08/21/2018   Lab Results  Component Value Date   HDL 48 03/18/2020   HDL 52  07/13/2019   HDL 33 (L) 08/21/2018   Lab Results  Component Value Date   LDLCALC 69 03/18/2020   LDLCALC 81 07/13/2019   LDLCALC 74 08/21/2018   Lab Results  Component Value Date   TRIG 58 03/18/2020   TRIG 69 07/13/2019   TRIG 74 08/21/2018   Lab Results  Component Value Date   CHOLHDL 2.7 03/18/2020   CHOLHDL 2.8 07/13/2019   CHOLHDL 3.7 08/21/2018   No results found for: LDLDIRECT Based on the results of lipid panel his/her cardiovascular risk factor ( using [facility] )  in the next 10 years is : The ASCVD Risk score Mikey Bussing DC Jr., et al., 2013) failed to calculate for the following reasons:   The 2013 ASCVD risk score is only valid for ages 23 to 109 Glucose:  Glucose  Date Value Ref Range Status  04/26/2012 105 (H) 65 - 99 mg/dL Final   Glucose, Bld  Date Value Ref Range Status  03/18/2020 126 (H) 65 - 99 mg/dL Final    Comment:    .            Fasting reference interval . For someone without known diabetes, a glucose value >125 mg/dL indicates that they may have diabetes and this should be confirmed with a follow-up test. .   01/12/2020 208 (H) 70 - 99 mg/dL Final    Comment:    Glucose reference range applies only to samples taken after fasting for at least 8 hours.  07/13/2019 141 (H) 65 - 99 mg/dL Final    Comment:    .            Fasting reference interval . For someone without known diabetes, a glucose value >125 mg/dL indicates that they may have diabetes and this should be confirmed with a follow-up test. .    Glucose-Capillary  Date Value Ref Range Status  08/19/2018 97 70 - 99 mg/dL Final  12/19/2017 90 65 - 99 mg/dL Final  12/18/2017 129 (H) 65 - 99 mg/dL Final    Social History      He  reports that he quit smoking about 49 years ago. His smoking use included cigarettes. He has never used smokeless tobacco. He reports that he does not drink alcohol and does not use drugs.       Social History   Socioeconomic History  . Marital  status: Widowed    Spouse name: Not on file  . Number of children: 5  . Years of education: 10  . Highest education level: Not on file  Occupational History  . Occupation: retired  Tobacco Use  . Smoking status: Former  Smoker    Types: Cigarettes    Quit date: 10/29/1970    Years since quitting: 49.6  . Smokeless tobacco: Never Used  Vaping Use  . Vaping Use: Never used  Substance and Sexual Activity  . Alcohol use: No    Alcohol/week: 0.0 standard drinks  . Drug use: No  . Sexual activity: Not Currently  Other Topics Concern  . Not on file  Social History Narrative   widowed, all children are healthy.    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    Social Determinants of Health   Financial Resource Strain:   . Difficulty of Paying Living Expenses: Not on file  Food Insecurity:   . Worried About Charity fundraiser in the Last Year: Not on file  . Ran Out of Food in the Last Year: Not on file  Transportation Needs:   . Lack of Transportation (Medical): Not on file  . Lack of Transportation (Non-Medical): Not on file  Physical Activity:   . Days of Exercise per Week: Not on file  . Minutes of Exercise per Session: Not on file  Stress:   . Feeling of Stress : Not on file  Social Connections:   . Frequency of Communication with Friends and Family: Not on file  . Frequency of Social Gatherings with Friends and Family: Not on file  . Attends Religious Services: Not on file  . Active Member of Clubs or Organizations: Not on file  . Attends Archivist Meetings: Not on file  . Marital Status: Not on file     Family History        Family Status  Relation Name Status  . Father  Deceased at age 73       old age  . Mother  Deceased at age 52       MI  . Brother  Deceased at age 53s       coronary disease  . Brother  Deceased at age 40       heart trouble  . Sister  Deceased at age 69s       heart disease  .  Sister  Deceased at age 3       heart disease   . Daughter  Alive  . Son  Alive  . Daughter  Alive  . Son  Alive  . Son  Alive  . Neg Hx  (Not Specified)        His family history includes Diabetes in his brother and mother; Heart disease in his mother; Hyperlipidemia in his brother; Hypertension in his brother, son, and son. There is no history of Birth defects or Stroke.       Family History  Problem Relation Age of Onset  . Diabetes Mother   . Heart disease Mother   . Hypertension Brother   . Hyperlipidemia Brother   . Diabetes Brother   . Hypertension Son   . Hypertension Son   . Birth defects Neg Hx   . Stroke Neg Hx     Patient Active Problem List   Diagnosis Date Noted  . Neutropenia, unspecified type ([place]) 05/28/2020  . Protein-calorie malnutrition ([place]) 04/21/2020  . Benign hypertensive kidney disease with chronic kidney disease 01/06/2020  . Secondary hyperparathyroidism of renal origin ([place]) 07/13/2019  . Iron deficiency anemia 11/24/2018  . Aortic atherosclerosis ([place]) 07/28/2018  . Cholelithiasis 07/28/2018  . Pancreatic lesion 07/28/2018  . Chronic  mesenteric ischemia ([place]) 07/28/2018  . Full code status 01/23/2018  . Memory loss 01/23/2018  . Carotid stenosis, asymptomatic, left 12/18/2017  . Degenerative arthritis of left shoulder region 08/19/2017  . Microalbuminuria 08/19/2017  . Type 2 diabetes mellitus ([place]) 08/13/2017  . Preventative health care 12/11/2016  . Abdominal mass 04/11/2016  . Abdominal mass, right upper quadrant 04/03/2016  . Bilateral carotid bruits 04/03/2016  . Facial injury 10/26/2015  . 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 Arteriosclerosis of coronary artery 10/11/2015  . H/O angina pectoris 10/11/2015  . Hyperlipidemia 09/13/2015  . Chronic kidney disease, stage III (moderate) ([place]) 09/13/2015  . RENAL ATHEROSCLEROSIS 03/16/2009  . Essential hypertension 03/15/2009  . Coronary atherosclerosis 03/15/2009  . Carotid  arterial disease ([place]) 03/15/2009  . Peripheral vascular disease ([place]) 03/15/2009  . Anemia in chronic kidney disease (CKD) 03/15/2009    Past Surgical History:  Procedure Laterality Date  . APPENDECTOMY  1970's  . COLONOSCOPY    . coronary artery stenting  1999   at [facility]   . ENDARTERECTOMY Left 12/18/2017   Procedure: ENDARTERECTOMY CAROTID WITH PATCH;  Surgeon: Delana Meyer Dolores Lory, MD;  Location: [facility] ORS;  Service: Vascular;  Laterality: Left;  . FACIAL LACERATION REPAIR Left 12/13/2015   performed at [facility] by Dr. Marcial Pacas  . renal angiography    . VISCERAL ANGIOGRAPHY N/A 08/19/2018   Procedure: VISCERAL ANGIOGRAPHY;  Surgeon: Katha Cabal, MD;  Location: [facility];  Service: Cardiovascular;  Laterality: N/A;     Current Outpatient Medications:  .  amLODipine (NORVASC) 5 MG tablet, Take 0.5 tablets (2.5 mg total) by mouth daily., Disp: 30 tablet, Rfl: 1 .  aspirin EC 81 MG tablet, Take 81 mg by mouth daily. , Disp: , Rfl:  .  atorvastatin (LIPITOR) 40 MG tablet, Take 1 tablet (40 mg total) by mouth at bedtime., Disp: 90 tablet, Rfl: 1 .  Cholecalciferol (VITAMIN D3) 1.25 MG (50000 UT) TABS, Take 1 tablet by mouth daily., Disp: , Rfl:  .  clopidogrel (PLAVIX) 75 MG tablet, TAKE 1 TABLET DAILY. (Patient taking differently: Take 75 mg by mouth daily. ), Disp: 90 tablet, Rfl: 0 .  docusate sodium (COLACE) 100 MG capsule, Take 100 mg by mouth daily as needed for mild constipation., Disp: , Rfl:  .  ferrous sulfate 325 (65 FE) MG EC tablet, Take 325 mg by mouth daily. , Disp: , Rfl:  .  furosemide (LASIX) 40 MG tablet, Take 40 mg by mouth daily. , Disp: , Rfl:  .  isosorbide mononitrate (IMDUR) 30 MG 24 hr tablet, Take 1 tablet (30 mg total) by mouth daily., Disp: , Rfl:  .  latanoprost (XALATAN) 0.005 % ophthalmic solution, Place 1 drop into both eyes at bedtime., Disp: , Rfl:  .  linagliptin (TRADJENTA) 5 MG TABS tablet, Take 1 tablet (5 mg total) by mouth daily. For  diabetes, Disp: 90 tablet, Rfl: 3 .  losartan (COZAAR) 100 MG tablet, Take 100 mg by mouth daily., Disp: , Rfl:  .  Multiple Vitamin (MULTIVITAMIN) tablet, Take 1 tablet by mouth daily. , Disp: , Rfl:  .  Nutritional Supplements (ENSURE ACTIVE HIGH PROTEIN) LIQD, Take 1 Can by mouth in the morning and at bedtime., Disp: 14220 mL, Rfl: 5 .  timolol (BETIMOL) 0.5 % ophthalmic solution, Place 1 drop into both eyes 2 (two) times daily., Disp: 10 mL, Rfl: 12 .  vitamin B-12 (CYANOCOBALAMIN) 1000 MCG tablet, Take 1,000 mcg by mouth daily., Disp: ,  Rfl:   No Known Allergies  Patient Care Team: Delsa Grana, PA-C as PCP - General (Family Medicine) Lavonia Dana, MD as Consulting Physician (Internal Medicine) Yolonda Kida, MD as Consulting Physician (Cardiology) Anell Barr, OD (Optometry) Bary Castilla, Forest Gleason, MD (General Surgery) Schnier, Dolores Lory, MD (Vascular Surgery) Sindy Guadeloupe, MD as Consulting Physician (Oncology)   Chart Review: I personally reviewed active problem list, medication list, allergies, family history, social history, health maintenance, notes from last encounter, lab results, imaging with the patient/caregiver today.   Review of Systems  10 Systems reviewed and are negative for acute change except as noted in the HPI.       Objective:   Vitals:  Vitals:   06/17/20 0858  BP: 138/82  Pulse: 76  Resp: 16  Temp: (!) 97.5 F (36.4 C)  SpO2: 94%  Weight: 131 lb 6.4 oz (59.6 kg)  Height: 5\' 6"  (1.676 m)    Body mass index is 21.21 kg/m.  Physical Exam Vitals and nursing note reviewed.  Constitutional:      General: He is not in acute distress.    Appearance: Normal appearance. He is well-developed. He is not ill-appearing, toxic-appearing or diaphoretic.     Comments: thin appearing elderly male, well-appearing, alert, no acute distress, hard of hearing  HENT:     Head: Normocephalic and atraumatic.     Right Ear: External ear normal.     Left  Ear: External ear normal.  Eyes:     General:        Right eye: No discharge.        Left eye: No discharge.     Conjunctiva/sclera: Conjunctivae normal.  Neck:     Trachea: No tracheal deviation.  Cardiovascular:     Rate and Rhythm: Normal rate and regular rhythm. Frequent extrasystoles are present.    Chest Wall: PMI is displaced.     Pulses: Normal pulses. No decreased pulses.     Heart sounds: Heart sounds are distant.  Pulmonary:     Effort: Pulmonary effort is normal. No tachypnea, accessory muscle usage or respiratory distress.     Breath sounds: Decreased air movement present. No stridor. No wheezing, rhonchi or rales.  Abdominal:     General: Bowel sounds are normal. There is no distension.     Tenderness: There is no abdominal tenderness.  Musculoskeletal:     Right lower leg: No edema.     Left lower leg: No edema.  Skin:    General: Skin is warm and dry.     Coloration: Skin is not jaundiced or pale.     Findings: No bruising or rash.  Neurological:     Mental Status: He is alert. Mental status is at baseline.     Motor: No abnormal muscle tone.  Psychiatric:        Mood and Affect: Mood normal.        Behavior: Behavior normal.    Diabetic Foot Exam: Diabetic Foot Exam - Simple   Simple Foot Form Diabetic Foot exam was performed with the following findings: Yes 06/17/2020  9:30 AM  Visual Inspection Sensation Testing Pulse Check Comments      Recent Results (from the past 2160 hour(s))  Iron and TIBC     Status: None   Collection Time: 04/18/20  2:15 PM  Result Value Ref Range   Iron 89 45 - 182 ug/dL   TIBC 267 250 - 450 ug/dL   Saturation Ratios 33  17.9 - 39.5 %   UIBC 178 ug/dL    Comment: Performed at [facility], [address] [place], [place] [address]  Ferritin     Status: None   Collection Time: 04/18/20  2:15 PM  Result Value Ref Range   Ferritin 49 24 - 336 ng/mL    Comment: Performed at [facility], [address] [place], [place] [address]  CBC with Differential/Platelet     Status: Abnormal   Collection Time: 04/18/20  2:15 PM  Result Value Ref Range   WBC 6.8 4.0 - 10.5 K/uL   RBC 3.28 (L) 4.22 - 5.81 MIL/uL   Hemoglobin 10.7 (L) 13.0 - 17.0 g/dL   HCT 33.8 (L) 39 - 52 %   MCV 103.0 (H) 80.0 - 100.0 fL   MCH 32.6 26.0 - 34.0 pg   MCHC 31.7 30.0 - 36.0 g/dL   RDW 13.2 11.5 - 15.5 %   Platelets 180 150 - 400 K/uL   nRBC 0.0 0.0 - 0.2 %   Neutrophils Relative % 75 %   Neutro Abs 5.1 1.7 - 7.7 K/uL   Lymphocytes Relative 11 %   Lymphs Abs 0.8 0.7 - 4.0 K/uL   Monocytes Relative 13 %   Monocytes Absolute 0.9 0 - 1 K/uL   Eosinophils Relative 1 %   Eosinophils Absolute 0.1 0 - 0 K/uL   Basophils Relative 0 %   Basophils Absolute 0.0 0 - 0 K/uL   Immature Granulocytes 0 %   Abs Immature Granulocytes 0.02 0.00 - 0.07 K/uL    Comment: Performed at [facility], [address] [place], [place] [address]     Fall Risk: Fall Risk  06/17/2020 04/21/2020 03/18/2020 09/18/2019 06/17/2019  Falls in the past year? 0 0 0 0 0  Number falls in past yr: 0 0 0 0 0  Injury with Fall? 0 0 0 0 0  Follow up - - - - -    Functional Status Survey: Is the patient deaf or have difficulty hearing?: Yes Does the patient have difficulty seeing, even when wearing glasses/contacts?: Yes Does the patient have difficulty concentrating, remembering, or making decisions?: No Does the patient have difficulty walking or climbing stairs?: No Does the patient have difficulty dressing or bathing?: No Does the patient have difficulty doing errands alone such as visiting a doctor's office or shopping?: No   Assessment & Plan:    CPE completed today  . PSA not indicated per age  . USPSTF grade A and B recommendations reviewed with patient; age-appropriate recommendations, preventive care, screening tests, etc discussed and encouraged; healthy living encouraged; see AVS for patient education given to  patient  . Discussed importance exercising as able  . Discussed importance of healthy diet:  eating lean meats and proteins, avoiding trans fats and saturated fats, avoid simple sugars and excessive carbs in diet, eat 6 servings of fruit/vegetables daily and drink plenty of water and avoid sweet beverages.  DASH diet reviewed if pt has HTN - pt supplementing with bost  . Recommended pt to do annual eye exam and routine dental exams/cleanings  . Reviewed Health Maintenance: Health Maintenance  Topic Date Due  . INFLUENZA VACCINE  05/29/2020  . HEMOGLOBIN A1C  09/18/2020  . OPHTHALMOLOGY EXAM  03/01/2021  . FOOT EXAM  06/17/2021  . TETANUS/TDAP  06/26/2021  . COVID-19 Vaccine  Completed  . PNA vac Low Risk Adult  Completed    . Immunizations: Immunization History  Administered Date(s) Administered  . Influenza, High Dose Seasonal PF 09/13/2015, 08/08/2016, 08/09/2017  . Influenza,inj,Quad PF,6+ Mos 07/08/2014  . Influenza-Unspecified 08/04/2007, 08/10/2008, 08/06/2012, 06/19/2013  . Moderna SARS-COVID-2 Vaccination 12/24/2019, 01/21/2020  . Pneumococcal Conjugate-13 04/20/2014  . Pneumococcal Polysaccharide-23 09/13/2015  . Tdap 06/27/2011  . Zoster 07/12/2008         ICD-10-CM   1. Preventative health care  Z00.00   2. Essential hypertension  I10    BP at goal today, pt feels good, cardiology and nephrology managing HTN and CKD  3. Type 2 diabetes mellitus with stage 3 chronic kidney disease, without long-term current use of insulin, unspecified whether stage 3a or 3b CKD ([place])  E11.22    N18.30    foot exam done today, pt has long thick nails, due for podiatry f/up, daughter calling now to make appt with podiatrist  4. Protein-calorie malnutrition, unspecified severity ([place])  E46    weight has been stable, no weight loss  5. Iron deficiency anemia, unspecified iron deficiency anemia type  D50.9    seeing Dr. Janese Banks, on iron supplement, last labs show improvement, has f/up  labs ordered with hematology     Delsa Grana, PA-C 06/17/20 9:59 AM  [facility]

## 2020-06-17 ENCOUNTER — Other Ambulatory Visit: Payer: Self-pay

## 2020-06-17 ENCOUNTER — Ambulatory Visit: Payer: Medicare PPO

## 2020-06-17 ENCOUNTER — Ambulatory Visit (INDEPENDENT_AMBULATORY_CARE_PROVIDER_SITE_OTHER): Payer: Medicare PPO | Admitting: Family Medicine

## 2020-06-17 ENCOUNTER — Encounter: Payer: Self-pay | Admitting: Family Medicine

## 2020-06-17 VITALS — BP 138/82 | HR 76 | Temp 97.5°F | Resp 16 | Ht 66.0 in | Wt 131.4 lb

## 2020-06-17 DIAGNOSIS — E1122 Type 2 diabetes mellitus with diabetic chronic kidney disease: Secondary | ICD-10-CM

## 2020-06-17 DIAGNOSIS — Z0000 Encounter for general adult medical examination without abnormal findings: Secondary | ICD-10-CM | POA: Diagnosis not present

## 2020-06-17 DIAGNOSIS — E46 Unspecified protein-calorie malnutrition: Secondary | ICD-10-CM | POA: Diagnosis not present

## 2020-06-17 DIAGNOSIS — N183 Chronic kidney disease, stage 3 unspecified: Secondary | ICD-10-CM

## 2020-06-17 DIAGNOSIS — D509 Iron deficiency anemia, unspecified: Secondary | ICD-10-CM

## 2020-06-17 DIAGNOSIS — I10 Essential (primary) hypertension: Secondary | ICD-10-CM

## 2020-06-17 NOTE — Patient Instructions (Signed)
Health Maintenance After Age 84 After age 84, you are at a higher risk for certain long-term diseases and infections as well as injuries from falls. Falls are a major cause of broken bones and head injuries in people who are older than age 84. Getting regular preventive care can help to keep you healthy and well. Preventive care includes getting regular testing and making lifestyle changes as recommended by your health care provider. Talk with your health care provider about:  Which screenings and tests you should have. A screening is a test that checks for a disease when you have no symptoms.  A diet and exercise plan that is right for you. What should I know about screenings and tests to prevent falls? Screening and testing are the best ways to find a health problem early. Early diagnosis and treatment give you the best chance of managing medical conditions that are common after age 84. Certain conditions and lifestyle choices may make you more likely to have a fall. Your health care provider may recommend:  Regular vision checks. Poor vision and conditions such as cataracts can make you more likely to have a fall. If you wear glasses, make sure to get your prescription updated if your vision changes.  Medicine review. Work with your health care provider to regularly review all of the medicines you are taking, including over-the-counter medicines. Ask your health care provider about any side effects that may make you more likely to have a fall. Tell your health care provider if any medicines that you take make you feel dizzy or sleepy.  Osteoporosis screening. Osteoporosis is a condition that causes the bones to get weaker. This can make the bones weak and cause them to break more easily.  Blood pressure screening. Blood pressure changes and medicines to control blood pressure can make you feel dizzy.  Strength and balance checks. Your health care provider may recommend certain tests to check your  strength and balance while standing, walking, or changing positions.  Foot health exam. Foot pain and numbness, as well as not wearing proper footwear, can make you more likely to have a fall.  Depression screening. You may be more likely to have a fall if you have a fear of falling, feel emotionally low, or feel unable to do activities that you used to do.  Alcohol use screening. Using too much alcohol can affect your balance and may make you more likely to have a fall. What actions can I take to lower my risk of falls? General instructions  Talk with your health care provider about your risks for falling. Tell your health care provider if: ? You fall. Be sure to tell your health care provider about all falls, even ones that seem minor. ? You feel dizzy, sleepy, or off-balance.  Take over-the-counter and prescription medicines only as told by your health care provider. These include any supplements.  Eat a healthy diet and maintain a healthy weight. A healthy diet includes low-fat dairy products, low-fat (lean) meats, and fiber from whole grains, beans, and lots of fruits and vegetables. Home safety  Remove any tripping hazards, such as rugs, cords, and clutter.  Install safety equipment such as grab bars in bathrooms and safety rails on stairs.  Keep rooms and walkways well-lit. Activity   Follow a regular exercise program to stay fit. This will help you maintain your balance. Ask your health care provider what types of exercise are appropriate for you.  If you need a cane or   walker, use it as recommended by your health care provider.  Wear supportive shoes that have nonskid soles. Lifestyle  Do not drink alcohol if your health care provider tells you not to drink.  If you drink alcohol, limit how much you have: ? 0-1 drink a day for women. ? 0-2 drinks a day for men.  Be aware of how much alcohol is in your drink. In the [place] one drink equals one typical bottle of beer (12  oz), one-half glass of wine (5 oz), or one shot of hard liquor (1 oz).  Do not use any products that contain nicotine or tobacco, such as cigarettes and e-cigarettes. If you need help quitting, ask your health care provider. Summary  Having a healthy lifestyle and getting preventive care can help to protect your health and wellness after age 82.  Screening and testing are the best way to find a health problem early and help you avoid having a fall. Early diagnosis and treatment give you the best chance for managing medical conditions that are more common for people who are older than age 25.  Falls are a major cause of broken bones and head injuries in people who are older than age 73. Take precautions to prevent a fall at home.  Work with your health care provider to learn what changes you can make to improve your health and wellness and to prevent falls. This information is not intended to replace advice given to you by your health care provider. Make sure you discuss any questions you have with your health care provider. Document Revised: 02/05/2019 Document Reviewed: 08/28/2017 Elsevier Patient Education  2020 [place] care involves care of body, mind, and spirit in order to improve a person's quality of life. Palliative care services are offered to people dealing with serious and life-threatening illnesses, often in the hospital or in a long-term care setting. The specific services are different for each person and are based on the person's needs and preferences. Palliative care requires a team of people who ensure:  Control of pain and other symptoms.  Family support.  Spiritual support.  Emotional and social support.  Comfort. Palliative care is a way to bring comfort and peace of mind to a person and his or her family. It can have a positive impact on the person's quality of life and the course of the illness. What is the difference between  palliative care and hospice? Palliative care and hospice have similar goals of managing symptoms, promoting comfort, improving quality of life, and maintaining a person's dignity. However, palliative care may be offered during any phase of a serious illness, while hospice care is usually offered when a person is expected to live for 6 months or less. Who can receive palliative care services? Palliative care is offered to children and adults who are seriously ill. It is often offered in cases where a person:  Is not responding well to treatment.  Needs pain management.  Had treatment or surgery and developed symptoms that are difficult to manage.  Is undergoing a treatment to cure a condition (active treatment), like chemotherapy.  Has a diagnosis of an advanced disease, or a disease that shortens his or her life. A health care provider will usually recommend palliative care services when more support would be helpful. Family members and friends may also receive palliative care services to cope with stress and other concerns. Who makes up the palliative care team? The following people make  up a palliative care team:  The person receiving care and his or her family.  Physicians, including primary health care providers and specialists.  Nurses.  A Education officer, museum. Depending on a person's needs, the following people may also be included on a palliative care team:  A pain specialist.  A hospice specialist.  A financial or Research officer, trade union.  Religious or spiritual leaders.  A care coordinator or case manager.  A bereavement coordinator. The team will talk with the person and his or her family about:  The role of the pain specialist and the hospice specialist.  The person's physical symptoms, such as pain, nausea, vomiting, and shortness of breath.  Stress, depression, and anxiety symptoms.  Function and mobility issues and how to stay as active as possible.  Treatment  options and how they may affect life.  Spiritual wishes, such as rituals and prayer.  Legacy and memory making activities.  Life and death as a normal process.  Advance directives or living wills, health care proxies, and end-of-life care.  Any other concerns or issues. The palliative care team will make it okay to talk about difficult issues and topics. They will address spiritual and emotional concerns and give the person's preferences high importance. Summary  Palliative care involves care of body, mind, and spirit in order to improve a person's quality of life.  The specific services are different for each person and are based on the person's needs and preferences.  Palliative care is a way to bring comfort and peace of mind to a person and his or her family. This information is not intended to replace advice given to you by your health care provider. Make sure you discuss any questions you have with your health care provider. Document Revised: 11/12/2017 Document Reviewed: 12/31/2016 Elsevier Patient Education  [place].

## 2020-06-23 ENCOUNTER — Encounter: Payer: Self-pay | Admitting: Podiatry

## 2020-06-23 ENCOUNTER — Ambulatory Visit: Payer: Medicare PPO | Admitting: Podiatry

## 2020-06-23 ENCOUNTER — Other Ambulatory Visit: Payer: Self-pay

## 2020-06-23 DIAGNOSIS — M79676 Pain in unspecified toe(s): Secondary | ICD-10-CM | POA: Diagnosis not present

## 2020-06-23 DIAGNOSIS — D689 Coagulation defect, unspecified: Secondary | ICD-10-CM

## 2020-06-23 DIAGNOSIS — B351 Tinea unguium: Secondary | ICD-10-CM

## 2020-06-23 DIAGNOSIS — N183 Chronic kidney disease, stage 3 unspecified: Secondary | ICD-10-CM

## 2020-06-23 NOTE — Progress Notes (Signed)
This patient returns to my office for at risk foot care.  This patient requires this care by a professional since this patient will be at risk due to having diabetes, CKD and coagulation defect.  Patient is taking plavix.  This patient is unable to cut nails himself since the patient cannot reach his nails.These nails are painful walking and wearing shoes.  This patient presents for at risk foot care today.  General Appearance  Alert, conversant and in no acute stress.  Vascular  Dorsalis pedis and posterior tibial  pulses are weakly  palpable  bilaterally.  Capillary return is within normal limits  bilaterally. Temperature is within normal limits  bilaterally.  Neurologic  Senn-Weinstein monofilament wire test within normal limits  bilaterally. Muscle power within normal limits bilaterally.  Nails Thick disfigured discolored nails with subungual debris  from hallux to fifth toes bilaterally. No evidence of bacterial infection or drainage bilaterally.  Orthopedic  No limitations of motion  feet .  No crepitus or effusions noted.  No bony pathology or digital deformities noted.  HAV  B/L.  Skin  normotropic skin with no porokeratosis noted bilaterally.  No signs of infections or ulcers noted.     Onychomycosis  Pain in right toes  Pain in left toes  Consent was obtained for treatment procedures.   Mechanical debridement of nails 1-5  bilaterally performed with a nail nipper.  Filed with dremel without incident.    Return office visit    prn                 Told patient to return for periodic foot care and evaluation due to potential at risk complications.   Gardiner Barefoot DPM

## 2020-07-12 ENCOUNTER — Ambulatory Visit (INDEPENDENT_AMBULATORY_CARE_PROVIDER_SITE_OTHER): Payer: Medicare PPO

## 2020-07-12 DIAGNOSIS — Z0000 Encounter for general adult medical examination without abnormal findings: Secondary | ICD-10-CM | POA: Diagnosis not present

## 2020-07-12 NOTE — Patient Instructions (Signed)
Samuel Bryan , Thank you for taking time to come for your Medicare Wellness Visit. I appreciate your ongoing commitment to your health goals. Please review the following plan we discussed and let me know if I can assist you in the future.   Screening recommendations/referrals: Colonoscopy: no longer required Recommended yearly ophthalmology/optometry visit for glaucoma screening and checkup Recommended yearly dental visit for hygiene and checkup  Vaccinations: Influenza vaccine: due Pneumococcal vaccine: done 09/13/15 Tdap vaccine: done 06/27/11 Shingles vaccine: Shingrix discussed. Please contact your pharmacy for coverage information.  Covid-19:  Done 12/24/19 & 01/21/20  Advanced directives: Please bring a copy of your health care power of attorney and living will to the office at your convenience.  Conditions/risks identified: Recommend preventing future falls  Next appointment: Follow up in one year for your annual wellness visit.   Preventive Care 18 Years and Older, Male Preventive care refers to lifestyle choices and visits with your health care provider that can promote health and wellness. What does preventive care include?  A yearly physical exam. This is also called an annual well check.  Dental exams once or twice a year.  Routine eye exams. Ask your health care provider how often you should have your eyes checked.  Personal lifestyle choices, including:  Daily care of your teeth and gums.  Regular physical activity.  Eating a healthy diet.  Avoiding tobacco and drug use.  Limiting alcohol use.  Practicing safe sex.  Taking low doses of aspirin every day.  Taking vitamin and mineral supplements as recommended by your health care provider. What happens during an annual well check? The services and screenings done by your health care provider during your annual well check will depend on your age, overall health, lifestyle risk factors, and family history of  disease. Counseling  Your health care provider may ask you questions about your:  Alcohol use.  Tobacco use.  Drug use.  Emotional well-being.  Home and relationship well-being.  Sexual activity.  Eating habits.  History of falls.  Memory and ability to understand (cognition).  Work and work Statistician. Screening  You may have the following tests or measurements:  Height, weight, and BMI.  Blood pressure.  Lipid and cholesterol levels. These may be checked every 5 years, or more frequently if you are over 14 years old.  Skin check.  Lung cancer screening. You may have this screening every year starting at age 60 if you have a 30-pack-year history of smoking and currently smoke or have quit within the past 15 years.  Fecal occult blood test (FOBT) of the stool. You may have this test every year starting at age 21.  Flexible sigmoidoscopy or colonoscopy. You may have a sigmoidoscopy every 5 years or a colonoscopy every 10 years starting at age 67.  Prostate cancer screening. Recommendations will vary depending on your family history and other risks.  Hepatitis C blood test.  Hepatitis B blood test.  Sexually transmitted disease (STD) testing.  Diabetes screening. This is done by checking your blood sugar (glucose) after you have not eaten for a while (fasting). You may have this done every 1-3 years.  Abdominal aortic aneurysm (AAA) screening. You may need this if you are a current or former smoker.  Osteoporosis. You may be screened starting at age 51 if you are at high risk. Talk with your health care provider about your test results, treatment options, and if necessary, the need for more tests. Vaccines  Your health care provider may  recommend certain vaccines, such as:  Influenza vaccine. This is recommended every year.  Tetanus, diphtheria, and acellular pertussis (Tdap, Td) vaccine. You may need a Td booster every 10 years.  Zoster vaccine. You may  need this after age 36.  Pneumococcal 13-valent conjugate (PCV13) vaccine. One dose is recommended after age 63.  Pneumococcal polysaccharide (PPSV23) vaccine. One dose is recommended after age 2. Talk to your health care provider about which screenings and vaccines you need and how often you need them. This information is not intended to replace advice given to you by your health care provider. Make sure you discuss any questions you have with your health care provider. Document Released: 11/11/2015 Document Revised: 07/04/2016 Document Reviewed: 08/16/2015 Elsevier Interactive Patient Education  2017 [place] Prevention in the Home Falls can cause injuries. They can happen to people of all ages. There are many things you can do to make your home safe and to help prevent falls. What can I do on the outside of my home?  Regularly fix the edges of walkways and driveways and fix any cracks.  Remove anything that might make you trip as you walk through a door, such as a raised step or threshold.  Trim any bushes or trees on the path to your home.  Use bright outdoor lighting.  Clear any walking paths of anything that might make someone trip, such as rocks or tools.  Regularly check to see if handrails are loose or broken. Make sure that both sides of any steps have handrails.  Any raised decks and porches should have guardrails on the edges.  Have any leaves, snow, or ice cleared regularly.  Use sand or salt on walking paths during winter.  Clean up any spills in your garage right away. This includes oil or grease spills. What can I do in the bathroom?  Use night lights.  Install grab bars by the toilet and in the tub and shower. Do not use towel bars as grab bars.  Use non-skid mats or decals in the tub or shower.  If you need to sit down in the shower, use a plastic, non-slip stool.  Keep the floor dry. Clean up any water that spills on the floor as soon as it  happens.  Remove soap buildup in the tub or shower regularly.  Attach bath mats securely with double-sided non-slip rug tape.  Do not have throw rugs and other things on the floor that can make you trip. What can I do in the bedroom?  Use night lights.  Make sure that you have a light by your bed that is easy to reach.  Do not use any sheets or blankets that are too big for your bed. They should not hang down onto the floor.  Have a firm chair that has side arms. You can use this for support while you get dressed.  Do not have throw rugs and other things on the floor that can make you trip. What can I do in the kitchen?  Clean up any spills right away.  Avoid walking on wet floors.  Keep items that you use a lot in easy-to-reach places.  If you need to reach something above you, use a strong step stool that has a grab bar.  Keep electrical cords out of the way.  Do not use floor polish or wax that makes floors slippery. If you must use wax, use non-skid floor wax.  Do not have throw rugs and  other things on the floor that can make you trip. What can I do with my stairs?  Do not leave any items on the stairs.  Make sure that there are handrails on both sides of the stairs and use them. Fix handrails that are broken or loose. Make sure that handrails are as long as the stairways.  Check any carpeting to make sure that it is firmly attached to the stairs. Fix any carpet that is loose or worn.  Avoid having throw rugs at the top or bottom of the stairs. If you do have throw rugs, attach them to the floor with carpet tape.  Make sure that you have a light switch at the top of the stairs and the bottom of the stairs. If you do not have them, ask someone to add them for you. What else can I do to help prevent falls?  Wear shoes that:  Do not have high heels.  Have rubber bottoms.  Are comfortable and fit you well.  Are closed at the toe. Do not wear sandals.  If you  use a stepladder:  Make sure that it is fully opened. Do not climb a closed stepladder.  Make sure that both sides of the stepladder are locked into place.  Ask someone to hold it for you, if possible.  Clearly mark and make sure that you can see:  Any grab bars or handrails.  First and last steps.  Where the edge of each step is.  Use tools that help you move around (mobility aids) if they are needed. These include:  Canes.  Walkers.  Scooters.  Crutches.  Turn on the lights when you go into a dark area. Replace any light bulbs as soon as they burn out.  Set up your furniture so you have a clear path. Avoid moving your furniture around.  If any of your floors are uneven, fix them.  If there are any pets around you, be aware of where they are.  Review your medicines with your doctor. Some medicines can make you feel dizzy. This can increase your chance of falling. Ask your doctor what other things that you can do to help prevent falls. This information is not intended to replace advice given to you by your health care provider. Make sure you discuss any questions you have with your health care provider. Document Released: 08/11/2009 Document Revised: 03/22/2016 Document Reviewed: 11/19/2014 Elsevier Interactive Patient Education  2017 [employer].

## 2020-07-12 NOTE — Progress Notes (Signed)
Subjective:   Westyn Driggers. is a 86 y.o. male who presents for Medicare Annual/Subsequent preventive examination.   Virtual Visit via Telephone Note  I connected with  Desmond Lope. on 07/12/20 at  2:50 PM EDT by telephone and verified that I am speaking with the correct person using two identifiers.  Medicare Annual Wellness visit completed telephonically due to Covid-19 pandemic.   Location: Patient: home Provider: [place]   I discussed the limitations, risks, security and privacy concerns of performing an evaluation and management service by telephone and the availability of in person appointments. The patient expressed understanding and agreed to proceed.  Unable to perform video visit due to video visit attempted and failed and/or patient does not have video capability.   Some vital signs may be absent or patient reported.   Clemetine Marker, LPN   Review of Systems     Cardiac Risk Factors include: advanced age (>31men, >82 women);dyslipidemia;diabetes mellitus;hypertension;male gender     Objective:    There were no vitals filed for this visit. There is no height or weight on file to calculate BMI.  Advanced Directives 07/12/2020 04/18/2020 01/12/2020 11/23/2019 05/21/2019 01/29/2019 11/24/2018  Does Patient Have a Medical Advance Directive? Yes Yes No Yes Yes Yes Yes  Type of Paramedic of [place];Living will [place];Living will - [place];Living will Living will;Healthcare Power of Attorney Living will;Healthcare Power of Attorney Living will  Does patient want to make changes to medical advance directive? - No - Patient declined - No - Patient declined - - No - Patient declined  Copy of [place] in Chart? No - copy requested No - copy requested - No - copy requested - No - copy requested No - copy requested  Would patient like information on creating a medical advance directive? - - No -  Patient declined - - - -    Current Medications (verified) Outpatient Encounter Medications as of 07/12/2020  Medication Sig  . amLODipine (NORVASC) 2.5 MG tablet   . aspirin EC 81 MG tablet Take 81 mg by mouth daily.   Marland Kitchen atorvastatin (LIPITOR) 40 MG tablet Take 1 tablet (40 mg total) by mouth at bedtime.  . Cholecalciferol (VITAMIN D3) 1.25 MG (50000 UT) TABS Take 1 tablet by mouth daily.  . clopidogrel (PLAVIX) 75 MG tablet TAKE 1 TABLET DAILY. (Patient taking differently: Take 75 mg by mouth daily. )  . docusate sodium (COLACE) 100 MG capsule Take 100 mg by mouth daily as needed for mild constipation.  . ferrous sulfate 325 (65 FE) MG EC tablet Take 325 mg by mouth daily.   . furosemide (LASIX) 40 MG tablet Take 40 mg by mouth daily.   . isosorbide mononitrate (IMDUR) 30 MG 24 hr tablet Take 1 tablet (30 mg total) by mouth daily.  Marland Kitchen latanoprost (XALATAN) 0.005 % ophthalmic solution Place 1 drop into both eyes at bedtime.  Marland Kitchen linagliptin (TRADJENTA) 5 MG TABS tablet Take 1 tablet (5 mg total) by mouth daily. For diabetes  . losartan (COZAAR) 100 MG tablet Take 100 mg by mouth daily.  . Multiple Vitamin (MULTIVITAMIN) tablet Take 1 tablet by mouth daily.   . Nutritional Supplements (ENSURE ACTIVE HIGH PROTEIN) LIQD Take 1 Can by mouth in the morning and at bedtime.  . timolol (BETIMOL) 0.5 % ophthalmic solution Place 1 drop into both eyes 2 (two) times daily.  . vitamin B-12 (CYANOCOBALAMIN) 1000 MCG tablet Take 1,000 mcg  by mouth daily.  . [DISCONTINUED] timolol (TIMOPTIC) 0.5 % ophthalmic solution    No facility-administered encounter medications on file as of 07/12/2020.    Allergies (verified) Patient has no known allergies.   History: Past Medical History:  Diagnosis Date  . Anemia   . Atherosclerosis of renal artery ([place])   . Essential hypertension, benign   . Occlusion and stenosis of carotid artery without mention of cerebral infarction    moderate left ICA stenosis  .  Peripheral vascular disease, unspecified ([place])    mild lifestyle limiting claudication  . Pure hypercholesterolemia   . Renal insufficiency   . Type 2 diabetes mellitus ([place])    Past Surgical History:  Procedure Laterality Date  . APPENDECTOMY  1970's  . COLONOSCOPY    . coronary artery stenting  1999   at [facility]   . ENDARTERECTOMY Left 12/18/2017   Procedure: ENDARTERECTOMY CAROTID WITH PATCH;  Surgeon: Delana Meyer Dolores Lory, MD;  Location: [facility] ORS;  Service: Vascular;  Laterality: Left;  . FACIAL LACERATION REPAIR Left 12/13/2015   performed at [facility] by Dr. Marcial Pacas  . renal angiography    . VISCERAL ANGIOGRAPHY N/A 08/19/2018   Procedure: VISCERAL ANGIOGRAPHY;  Surgeon: Katha Cabal, MD;  Location: [facility];  Service: Cardiovascular;  Laterality: N/A;   Family History  Problem Relation Age of Onset  . Diabetes Mother   . Heart disease Mother   . Hypertension Brother   . Hyperlipidemia Brother   . Diabetes Brother   . Hypertension Son   . Hypertension Son   . Birth defects Neg Hx   . Stroke Neg Hx    Social History   Socioeconomic History  . Marital status: Widowed    Spouse name: Not on file  . Number of children: 5  . Years of education: 10  . Highest education level: Not on file  Occupational History  . Occupation: retired  Tobacco Use  . Smoking status: Former Smoker    Types: Cigarettes    Quit date: 10/29/1970    Years since quitting: 49.7  . Smokeless tobacco: Never Used  Vaping Use  . Vaping Use: Never used  Substance and Sexual Activity  . Alcohol use: No    Alcohol/week: 0.0 standard drinks  . Drug use: No  . Sexual activity: Not Currently  Other Topics Concern  . Not on file  Social History Narrative   widowed, all children are healthy.    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    Social Determinants of Health   Financial Resource Strain: Low Risk   . Difficulty of Paying  Living Expenses: Not hard at all  Food Insecurity: No Food Insecurity  . Worried About Charity fundraiser in the Last Year: Never true  . Ran Out of Food in the Last Year: Never true  Transportation Needs: No Transportation Needs  . Lack of Transportation (Medical): No  . Lack of Transportation (Non-Medical): No  Physical Activity: Sufficiently Active  . Days of Exercise per Week: 5 days  . Minutes of Exercise per Session: 50 min  Stress: No Stress Concern Present  . Feeling of Stress : Not at all  Social Connections: Moderately Isolated  . Frequency of Communication with Friends and Family: More than three times a week  . Frequency of Social Gatherings with Friends and Family: Three times a week  . Attends Religious Services: More than 4 times per  year  . Active Member of Clubs or Organizations: No  . Attends Archivist Meetings: Never  . Marital Status: Widowed    Tobacco Counseling Counseling given: Not Answered   Clinical Intake:  Pre-visit preparation completed: Yes  Pain : No/denies pain     Nutritional Risks: None Diabetes: Yes CBG done?: No Did pt. bring in CBG monitor from home?: No  How often do you need to have someone help you when you read instructions, pamphlets, or other written materials from your doctor or pharmacy?: 3 - Sometimes  Nutrition Risk Assessment:  Has the patient had any N/V/D within the last 2 months?  No  Does the patient have any non-healing wounds?  No  Has the patient had any unintentional weight loss or weight gain?  No   Diabetes:  Is the patient diabetic?  Yes  If diabetic, was a CBG obtained today?  No  Did the patient bring in their glucometer from home?  No  How often do you monitor your CBG's? daily.   Financial Strains and Diabetes Management:  Are you having any financial strains with the device, your supplies or your medication? No .  Does the patient want to be seen by Chronic Care Management for  management of their diabetes?  No  Would the patient like to be referred to a Nutritionist or for Diabetic Management?  No   Diabetic Exams:  Diabetic Eye Exam: Completed 03/01/20 negative retinopathy.   Diabetic Foot Exam: Completed 06/17/20.   Interpreter Needed?: No  Information entered by :: Clemetine Marker LPN   Activities of Daily Living In your present state of health, do you have any difficulty performing the following activities: 07/12/2020 06/17/2020  Hearing? Y Y  Comment pt wears hearing aids -  Vision? Y Y  Difficulty concentrating or making decisions? N N  Walking or climbing stairs? N N  Dressing or bathing? N N  Doing errands, shopping? N N  Preparing Food and eating ? N -  Using the Toilet? N -  In the past six months, have you accidently leaked urine? N -  Do you have problems with loss of bowel control? N -  Managing your Medications? N -  Managing your Finances? N -  Housekeeping or managing your Housekeeping? N -  Some recent data might be hidden    Patient Care Team: Delsa Grana, PA-C as PCP - General (Family Medicine) Lavonia Dana, MD as Consulting Physician (Internal Medicine) Yolonda Kida, MD as Consulting Physician (Cardiology) Anell Barr, OD (Optometry) Bary Castilla, Forest Gleason, MD (General Surgery) Schnier, Dolores Lory, MD (Vascular Surgery) Sindy Guadeloupe, MD as Consulting Physician (Oncology)  Indicate any recent Medical Services you may have received from other than [facility] providers in the past year (date may be approximate).     Assessment:   This is a routine wellness examination for Maxie.  Hearing/Vision screen  Hearing Screening   125Hz  250Hz  500Hz  1000Hz  2000Hz  3000Hz  4000Hz  6000Hz  8000Hz   Right ear:           Left ear:           Comments: Pt wears hearing aids   Vision Screening Comments: [facility] screenings [facility]  Dietary issues and exercise activities discussed: Current Exercise Habits: Home  exercise routine, Type of exercise: Other - see comments (working on his farm), Time (Minutes): 60, Frequency (Times/Week): 5, Weekly Exercise (Minutes/Week): 300, Intensity: Moderate, Exercise limited by: None identified  Goals    .  LIFESTYLE - DECREASE FALLS RISK      Depression Screen PHQ 2/9 Scores 07/12/2020 06/17/2020 04/21/2020 03/18/2020 09/18/2019 06/17/2019 03/06/2019  PHQ - 2 Score 0 0 0 0 0 0 0  PHQ- 9 Score - 2 0 0 0 0 0    Fall Risk Fall Risk  07/12/2020 06/17/2020 04/21/2020 03/18/2020 09/18/2019  Falls in the past year? 1 0 0 0 0  Number falls in past yr: 0 0 0 0 0  Injury with Fall? 0 0 0 0 0  Risk for fall due to : History of fall(s) - - - -  Follow up Falls prevention discussed - - - -    Any stairs in or around the home? Yes  If so, are there any without handrails? No    Home free of loose throw rugs in walkways, pet beds, electrical cords, etc? Yes  Adequate lighting in your home to reduce risk of falls? Yes   ASSISTIVE DEVICES UTILIZED TO PREVENT FALLS:  Life alert? No  Use of a cane, walker or w/c? No  Grab bars in the bathroom? Yes  Shower chair or bench in shower? No  Elevated toilet seat or a handicapped toilet? No   TIMED UP AND GO:  Was the test performed? No . Telephonic visit.   Cognitive Function: pt declined 6CIT for 2021 AWV     6CIT Screen 01/23/2018  What Year? 4 points  What month? 0 points  What time? 0 points  Count back from 20 4 points  Months in reverse 4 points  Repeat phrase 10 points  Total Score 22    Immunizations Immunization History  Administered Date(s) Administered  . Influenza, High Dose Seasonal PF 09/13/2015, 08/08/2016, 08/09/2017  . Influenza,inj,Quad PF,6+ Mos 07/08/2014  . Influenza-Unspecified 08/04/2007, 08/10/2008, 08/06/2012, 06/19/2013  . Moderna SARS-COVID-2 Vaccination 12/24/2019, 01/21/2020  . Pneumococcal Conjugate-13 04/20/2014  . Pneumococcal Polysaccharide-23 09/13/2015  . Tdap 06/27/2011  . Zoster  07/12/2008    TDAP status: Up to date   Flu Vaccine status: Declined, Education has been provided regarding the importance of this vaccine but patient still declined. Advised may receive this vaccine at local pharmacy or Health Dept. Aware to provide a copy of the vaccination record if obtained from local pharmacy or Health Dept. Verbalized acceptance and understanding.   Pneumococcal vaccine status: Up to date   Covid-19 vaccine status: Completed vaccines  Qualifies for Shingles Vaccine? Yes   Zostavax completed Yes   Shingrix Completed?: No.    Education has been provided regarding the importance of this vaccine. Patient has been advised to call insurance company to determine out of pocket expense if they have not yet received this vaccine. Advised may also receive vaccine at local pharmacy or Health Dept. Verbalized acceptance and understanding.  Screening Tests Health Maintenance  Topic Date Due  . INFLUENZA VACCINE  05/29/2020  . HEMOGLOBIN A1C  09/18/2020  . OPHTHALMOLOGY EXAM  03/01/2021  . FOOT EXAM  06/17/2021  . TETANUS/TDAP  06/26/2021  . COVID-19 Vaccine  Completed  . PNA vac Low Risk Adult  Completed    Health Maintenance  Health Maintenance Due  Topic Date Due  . INFLUENZA VACCINE  05/29/2020    Colorectal cancer screening: No longer required.   Lung Cancer Screening: (Low Dose CT Chest recommended if Age 15-80 years, 30 pack-year currently smoking OR have quit w/in 15years.) does not qualify.    Additional Screening:  Hepatitis C Screening: no longer required  Vision Screening: Recommended annual  ophthalmology exams for early detection of glaucoma and other disorders of the eye. Is the patient up to date with their annual eye exam?  Yes  Who is the provider or what is the name of the office in which the patient attends annual eye exams? Dr. Ellin Mayhew   Dental Screening: Recommended annual dental exams for proper oral hygiene  Community Resource Referral  / Chronic Care Management: CRR required this visit?  No   CCM required this visit?  No      Plan:     I have personally reviewed and noted the following in the patient's chart:   . Medical and social history . Use of alcohol, tobacco or illicit drugs  . Current medications and supplements . Functional ability and status . Nutritional status . Physical activity . Advanced directives . List of other physicians . Hospitalizations, surgeries, and ER visits in previous 12 months . Vitals . Screenings to include cognitive, depression, and falls . Referrals and appointments  In addition, I have reviewed and discussed with patient certain preventive protocols, quality metrics, and best practice recommendations. A written personalized care plan for preventive services as well as general preventive health recommendations were provided to patient.     Clemetine Marker, LPN   4/32/0037   Nurse Notes: pt accompanied during telephonic visit by daughter [name]

## 2020-07-14 NOTE — Progress Notes (Signed)
Thanks - I have about 100 things in my boxes so I'll get to it  Delsa Grana, PA-C

## 2020-07-18 ENCOUNTER — Ambulatory Visit (INDEPENDENT_AMBULATORY_CARE_PROVIDER_SITE_OTHER): Payer: Medicare PPO

## 2020-07-18 ENCOUNTER — Encounter (INDEPENDENT_AMBULATORY_CARE_PROVIDER_SITE_OTHER): Payer: Self-pay | Admitting: Vascular Surgery

## 2020-07-18 ENCOUNTER — Ambulatory Visit (INDEPENDENT_AMBULATORY_CARE_PROVIDER_SITE_OTHER): Payer: Medicare PPO | Admitting: Vascular Surgery

## 2020-07-18 ENCOUNTER — Other Ambulatory Visit: Payer: Self-pay

## 2020-07-18 VITALS — BP 194/70 | HR 60 | Resp 16 | Wt 127.8 lb

## 2020-07-18 DIAGNOSIS — I6522 Occlusion and stenosis of left carotid artery: Secondary | ICD-10-CM

## 2020-07-18 DIAGNOSIS — I10 Essential (primary) hypertension: Secondary | ICD-10-CM | POA: Diagnosis not present

## 2020-07-18 DIAGNOSIS — K551 Chronic vascular disorders of intestine: Secondary | ICD-10-CM | POA: Diagnosis not present

## 2020-07-18 DIAGNOSIS — I2510 Atherosclerotic heart disease of native coronary artery without angina pectoris: Secondary | ICD-10-CM

## 2020-07-18 DIAGNOSIS — I739 Peripheral vascular disease, unspecified: Secondary | ICD-10-CM

## 2020-07-18 NOTE — Progress Notes (Signed)
MRN : [id number]  Samuel Bryan. is a 86 y.o. (January 08, 1927) male who presents with chief complaint of  Chief Complaint  Patient presents with  . Follow-up    ultrasound follow up  .  History of Present Illness:   The patient returns to the office for followup and review status post angiogram with interventionon 08/20/2019.  Procedure(s) Performed: 1. Abdominal aortogram 2. Bilateral iliofemoralrunoff 3. Percutaneous transluminal angioplasty and stent placement right common iliac artery; "kissing balloon" technique 4. Percutaneous transluminal and plasty and stent placement left common iliac artery; "kissing balloon" technique 5. Ultrasound guided access bilateral common femoral arteries 6.Introduction catheter into celiac artery 7.Percutaneous transluminal angioplasty and stent placement celiac artery 8.StarClose closure device bilateral common femoral arteries  The patient notes improvement in the lower extremity symptoms. No interval shortening of the patient's claudication distance or rest pain symptoms. Previous wounds have now healed. No new ulcers or wounds have occurred since the last visit.  There have been no significant changes to the patient's overall health care.  He is s/p left CEA 12/18/2017.The patient denies amaurosis fugax or recent TIA symptoms. There are no recent neurological changes noted. The patient denies history of DVT, PE or superficial thrombophlebitis. The patient denies recent episodes of angina or shortness of breath.   ABI's Rt=NCand Lt=; triphasic posterior tibial signals bilaterally(previous Rt=NCand Lt=)  Duplex ultrasound of the carotid arteries shows <40% ICA stenosis bilaterally  Previous duplex US of themesentericarterial system shows patent celiac stent  Current Meds  Medication Sig  . amLODipine (NORVASC) 2.5  MG tablet   . aspirin EC 81 MG tablet Take 81 mg by mouth daily.   Marland Kitchen atorvastatin (LIPITOR) 40 MG tablet Take 1 tablet (40 mg total) by mouth at bedtime.  . Cholecalciferol (VITAMIN D3) 1.25 MG (50000 UT) TABS Take 1 tablet by mouth daily.  . clopidogrel (PLAVIX) 75 MG tablet TAKE 1 TABLET DAILY. (Patient taking differently: Take 75 mg by mouth daily. )  . docusate sodium (COLACE) 100 MG capsule Take 100 mg by mouth daily as needed for mild constipation.  . ferrous sulfate 325 (65 FE) MG EC tablet Take 325 mg by mouth daily.   . furosemide (LASIX) 40 MG tablet Take 40 mg by mouth daily.   . isosorbide mononitrate (IMDUR) 30 MG 24 hr tablet Take 1 tablet (30 mg total) by mouth daily.  Marland Kitchen latanoprost (XALATAN) 0.005 % ophthalmic solution Place 1 drop into both eyes at bedtime.  Marland Kitchen linagliptin (TRADJENTA) 5 MG TABS tablet Take 1 tablet (5 mg total) by mouth daily. For diabetes  . losartan (COZAAR) 100 MG tablet Take 100 mg by mouth daily.  . Multiple Vitamin (MULTIVITAMIN) tablet Take 1 tablet by mouth daily.   . Nutritional Supplements (ENSURE ACTIVE HIGH PROTEIN) LIQD Take 1 Can by mouth in the morning and at bedtime.  . timolol (BETIMOL) 0.5 % ophthalmic solution Place 1 drop into both eyes 2 (two) times daily.  . vitamin B-12 (CYANOCOBALAMIN) 1000 MCG tablet Take 1,000 mcg by mouth daily.    Past Medical History:  Diagnosis Date  . Anemia   . Atherosclerosis of renal artery ([place])   . Essential hypertension, benign   . Occlusion and stenosis of carotid artery without mention of cerebral infarction    moderate left ICA stenosis  . Peripheral vascular disease, unspecified ([place])    mild lifestyle limiting claudication  . Pure hypercholesterolemia   . Renal insufficiency   . Type 2 diabetes mellitus ([place])  Past Surgical History:  Procedure Laterality Date  . APPENDECTOMY  1970's  . COLONOSCOPY    . coronary artery stenting  1999   at [facility]   . ENDARTERECTOMY Left 12/18/2017    Procedure: ENDARTERECTOMY CAROTID WITH PATCH;  Surgeon: Delana Meyer Dolores Lory, MD;  Location: [facility] ORS;  Service: Vascular;  Laterality: Left;  . FACIAL LACERATION REPAIR Left 12/13/2015   performed at [facility] by Dr. Marcial Pacas  . renal angiography    . VISCERAL ANGIOGRAPHY N/A 08/19/2018   Procedure: VISCERAL ANGIOGRAPHY;  Surgeon: Katha Cabal, MD;  Location: [place] [facility];  Service: Cardiovascular;  Laterality: N/A;    Social History Social History   Tobacco Use  . Smoking status: Former Smoker    Types: Cigarettes    Quit date: 10/29/1970    Years since quitting: 49.7  . Smokeless tobacco: Never Used  Vaping Use  . Vaping Use: Never used  Substance Use Topics  . Alcohol use: No    Alcohol/week: 0.0 standard drinks  . Drug use: No    Family History Family History  Problem Relation Age of Onset  . Diabetes Mother   . Heart disease Mother   . Hypertension Brother   . Hyperlipidemia Brother   . Diabetes Brother   . Hypertension Son   . Hypertension Son   . Birth defects Neg Hx   . Stroke Neg Hx     No Known Allergies   REVIEW OF SYSTEMS (Negative unless checked)  Constitutional: [] Weight loss  [] Fever  [] Chills Cardiac: [] Chest pain   [] Chest pressure   [] Palpitations   [] Shortness of breath when laying flat   [] Shortness of breath with exertion. Vascular:  [] Pain in legs with walking   [] Pain in legs at rest  [] History of DVT   [] Phlebitis   [] Swelling in legs   [] Varicose veins   [] Non-healing ulcers Pulmonary:   [] Uses home oxygen   [] Productive cough   [] Hemoptysis   [] Wheeze  [] COPD   [] Asthma Neurologic:  [] Dizziness   [] Seizures   [] History of stroke   [] History of TIA  [] Aphasia   [] Vissual changes   [] Weakness or numbness in arm   [] Weakness or numbness in leg Musculoskeletal:   [] Joint swelling   [] Joint pain   [] Low back pain Hematologic:  [] Easy bruising  [] Easy bleeding   [] Hypercoagulable state   [] Anemic Gastrointestinal:  [] Diarrhea    [] Vomiting  [] Gastroesophageal reflux/heartburn   [] Difficulty swallowing. Genitourinary:  [] Chronic kidney disease   [] Difficult urination  [] Frequent urination   [] Blood in urine Skin:  [] Rashes   [] Ulcers  Psychological:  [] History of anxiety   []  History of major depression.  Physical Examination  Vitals:   07/18/20 1049 07/18/20 1050  BP: (!) 180/61 (!) 194/70  Pulse: 60   Resp: 16   Weight: 127 lb 12.8 oz (58 kg)    Body mass index is 20.63 kg/m. Gen: WD/WN, NAD Head: [place]/AT, No temporalis wasting.  Ear/Nose/Throat: Hearing grossly intact, nares w/o erythema or drainage Eyes: PER, EOMI, sclera nonicteric.  Neck: Supple, no large masses.   Pulmonary:  Good air movement, no audible wheezing bilaterally, no use of accessory muscles.  Cardiac: RRR, no JVD Vascular:  Vessel Right Left  Radial Palpable Palpable  PT Not Palpable Not Palpable  DP Not Palpable Not Palpable  Gastrointestinal: Non-distended. No guarding/no peritoneal signs.  Musculoskeletal: M/S 5/5 throughout.  No deformity or atrophy.  Neurologic: CN 2-12 intact. Symmetrical.  Speech is fluent. Motor exam  as listed above. Psychiatric: Judgment intact, Mood & affect appropriate for pt's clinical situation. Dermatologic: No rashes or ulcers noted.  No changes consistent with cellulitis.   CBC Lab Results  Component Value Date   WBC 6.8 04/18/2020   HGB 10.7 (L) 04/18/2020   HCT 33.8 (L) 04/18/2020   MCV 103.0 (H) 04/18/2020   PLT 180 04/18/2020    BMET    Component Value Date/Time   NA 141 03/18/2020 0951   NA 137 04/09/2016 1008   NA 137 04/26/2012 1145   K 4.8 03/18/2020 0951   K 4.0 04/26/2012 1145   CL 102 03/18/2020 0951   CL 104 04/26/2012 1145   CO2 33 (H) 03/18/2020 0951   CO2 29 04/26/2012 1145   GLUCOSE 126 (H) 03/18/2020 0951   GLUCOSE 105 (H) 04/26/2012 1145   BUN 39 (H) 03/18/2020 0951   BUN 19 04/09/2016 1008   BUN 16 04/26/2012 1145   CREATININE 1.82 (H) 03/18/2020 0951    CALCIUM 9.5 03/18/2020 0951   CALCIUM 8.9 04/26/2012 1145   GFRNONAA 32 (L) 03/18/2020 0951   GFRAA 37 (L) 03/18/2020 0951   CrCl cannot be calculated (Patient's most recent lab result is older than the maximum 21 days allowed.).  COAG Lab Results  Component Value Date   INR 1.06 12/10/2017    Radiology No results found.   Assessment/Plan 1. Carotid stenosis, asymptomatic, left Recommend:  Given the patient's asymptomatic subcritical stenosis no further invasive testing or surgery at this time.  Continue antiplatelet therapy as prescribed Continue management of CAD, HTN and Hyperlipidemia Healthy heart diet, encouraged exercise at least 4 times per week Follow upas arranged  2. Chronic mesenteric ischemia (HCC) Recommend:  The patient has evidence of chronic asymptomatic mesenteric atherosclerosis.  The patient denies lifestyle limiting changes at this point in time.  Given the lack of symptoms no intervention is warranted at this time.   No further invasive studies, angiography or surgery at this time  The patient should continue walking and begin a more formal exercise program.   The patient should continue antiplatelet therapy and aggressive treatment of the lipid abnormalities  Patient should undergo noninvasive studies as ordered. The patient will follow up with me after the studies.  - VAS US AORTA/IVC/ILIACS; Future  3. Peripheral vascular disease ([place]) Recommend:  The patient has evidence of atherosclerosis of the lower extremities with claudication.  The patient does not voice lifestyle limiting changes at this point in time.  Noninvasive studies do not suggest clinically significant change.  No invasive studies, angiography or surgery at this time The patient should continue walking and begin a more formal exercise program.  The patient should continue antiplatelet therapy and aggressive treatment of the lipid abnormalities  No changes in  the patient's medications at this time  4. Essential hypertension Continue antihypertensive medications as already ordered, these medications have been reviewed and there are no changes at this time.   5. Atherosclerosis of native coronary artery of native heart without angina pectoris Continue cardiac and antihypertensive medications as already ordered and reviewed, no changes at this time.  Continue statin as ordered and reviewed, no changes at this time  Nitrates PRN for chest pain    Hortencia Pilar, MD  07/18/2020 12:55 PM

## 2020-09-21 ENCOUNTER — Ambulatory Visit: Payer: Medicare PPO | Admitting: Family Medicine

## 2020-10-10 ENCOUNTER — Ambulatory Visit: Payer: Medicare PPO | Admitting: Podiatry

## 2020-10-10 ENCOUNTER — Encounter: Payer: Self-pay | Admitting: Podiatry

## 2020-10-10 ENCOUNTER — Other Ambulatory Visit: Payer: Self-pay

## 2020-10-10 DIAGNOSIS — R809 Proteinuria, unspecified: Secondary | ICD-10-CM | POA: Diagnosis not present

## 2020-10-10 DIAGNOSIS — N183 Chronic kidney disease, stage 3 unspecified: Secondary | ICD-10-CM

## 2020-10-10 DIAGNOSIS — D689 Coagulation defect, unspecified: Secondary | ICD-10-CM | POA: Diagnosis not present

## 2020-10-10 DIAGNOSIS — B351 Tinea unguium: Secondary | ICD-10-CM | POA: Diagnosis not present

## 2020-10-10 DIAGNOSIS — D631 Anemia in chronic kidney disease: Secondary | ICD-10-CM | POA: Diagnosis not present

## 2020-10-10 DIAGNOSIS — E1122 Type 2 diabetes mellitus with diabetic chronic kidney disease: Secondary | ICD-10-CM | POA: Diagnosis not present

## 2020-10-10 DIAGNOSIS — L03031 Cellulitis of right toe: Secondary | ICD-10-CM | POA: Insufficient documentation

## 2020-10-10 DIAGNOSIS — N1832 Chronic kidney disease, stage 3b: Secondary | ICD-10-CM | POA: Diagnosis not present

## 2020-10-10 DIAGNOSIS — I129 Hypertensive chronic kidney disease with stage 1 through stage 4 chronic kidney disease, or unspecified chronic kidney disease: Secondary | ICD-10-CM | POA: Diagnosis not present

## 2020-10-10 DIAGNOSIS — N2581 Secondary hyperparathyroidism of renal origin: Secondary | ICD-10-CM | POA: Diagnosis not present

## 2020-10-10 DIAGNOSIS — M79676 Pain in unspecified toe(s): Secondary | ICD-10-CM

## 2020-10-10 DIAGNOSIS — I10 Essential (primary) hypertension: Secondary | ICD-10-CM | POA: Diagnosis not present

## 2020-10-10 NOTE — Progress Notes (Signed)
This patient returns to my office for at risk foot care.  This patient requires this care by a professional since this patient will be at risk due to having diabetes, CKD and coagulation defect.  Patient is taking plavix.  This patient is unable to cut nails himself since the patient cannot reach his nails.These nails are painful walking and wearing shoes.   He says he has severe pain along the inside of his right big toe.  No drainage. This patient presents for at risk foot care today. Patient presents with his daughter.  General Appearance  Alert, conversant and in no acute stress.  Vascular  Dorsalis pedis and posterior tibial  pulses are weakly  palpable  bilaterally.  Capillary return is within normal limits  bilaterally. Temperature is within normal limits  bilaterally.  Neurologic  Senn-Weinstein monofilament wire test within normal limits  bilaterally. Muscle power within normal limits bilaterally.  Nails Thick disfigured discolored nails with subungual debris  from hallux to fifth toes bilaterally. No evidence of bacterial infection or drainage bilaterally.  Medial border right hallux growing into his distal nail border.  Open wound noted under medial border right hallux.  Orthopedic  No limitations of motion  feet .  No crepitus or effusions noted.  No bony pathology or digital deformities noted.  HAV  B/L.  Skin  normotropic skin with no porokeratosis noted bilaterally.      Onychomycosis  Pain in right toes  Pain in left toes  Paronychia right hallux.  Consent was obtained for treatment procedures.   Mechanical debridement of nails 1-5  bilaterally performed with a nail nipper.  Filed with dremel without incident. I & D medial border right hallux.  Neosporin/DSD.  Told him to peroxide at home.     Return office visit    10 weeks                Told patient to return for periodic foot care and evaluation due to potential at risk complications.   Gardiner Barefoot DPM

## 2020-10-17 ENCOUNTER — Inpatient Hospital Stay: Payer: Medicare PPO | Attending: Oncology | Admitting: Oncology

## 2020-10-17 VITALS — BP 132/49 | HR 60 | Temp 98.6°F | Resp 16 | Wt 135.0 lb

## 2020-10-17 DIAGNOSIS — E7800 Pure hypercholesterolemia, unspecified: Secondary | ICD-10-CM | POA: Insufficient documentation

## 2020-10-17 DIAGNOSIS — N183 Chronic kidney disease, stage 3 unspecified: Secondary | ICD-10-CM | POA: Diagnosis not present

## 2020-10-17 DIAGNOSIS — I129 Hypertensive chronic kidney disease with stage 1 through stage 4 chronic kidney disease, or unspecified chronic kidney disease: Secondary | ICD-10-CM | POA: Diagnosis not present

## 2020-10-17 DIAGNOSIS — N189 Chronic kidney disease, unspecified: Secondary | ICD-10-CM | POA: Diagnosis not present

## 2020-10-17 DIAGNOSIS — Z87891 Personal history of nicotine dependence: Secondary | ICD-10-CM | POA: Diagnosis not present

## 2020-10-17 DIAGNOSIS — Z79899 Other long term (current) drug therapy: Secondary | ICD-10-CM | POA: Insufficient documentation

## 2020-10-17 DIAGNOSIS — N2581 Secondary hyperparathyroidism of renal origin: Secondary | ICD-10-CM | POA: Diagnosis not present

## 2020-10-17 DIAGNOSIS — D709 Neutropenia, unspecified: Secondary | ICD-10-CM | POA: Diagnosis not present

## 2020-10-17 DIAGNOSIS — D631 Anemia in chronic kidney disease: Secondary | ICD-10-CM | POA: Insufficient documentation

## 2020-10-17 DIAGNOSIS — E785 Hyperlipidemia, unspecified: Secondary | ICD-10-CM | POA: Insufficient documentation

## 2020-10-17 DIAGNOSIS — R809 Proteinuria, unspecified: Secondary | ICD-10-CM | POA: Diagnosis not present

## 2020-10-17 DIAGNOSIS — E1151 Type 2 diabetes mellitus with diabetic peripheral angiopathy without gangrene: Secondary | ICD-10-CM | POA: Insufficient documentation

## 2020-10-17 DIAGNOSIS — N1832 Chronic kidney disease, stage 3b: Secondary | ICD-10-CM | POA: Diagnosis not present

## 2020-10-17 DIAGNOSIS — E1122 Type 2 diabetes mellitus with diabetic chronic kidney disease: Secondary | ICD-10-CM | POA: Diagnosis not present

## 2020-10-17 DIAGNOSIS — Z7982 Long term (current) use of aspirin: Secondary | ICD-10-CM | POA: Diagnosis not present

## 2020-10-18 ENCOUNTER — Encounter: Payer: Self-pay | Admitting: Oncology

## 2020-10-18 NOTE — Progress Notes (Signed)
Hematology/Oncology Consult note [facility]  Telephone:([phone] [phone]  Patient Care Team: Delsa Grana, PA-C as PCP - General (Family Medicine) Lavonia Dana, MD as Consulting Physician (Internal Medicine) Yolonda Kida, MD as Consulting Physician (Cardiology) Anell Barr, [place] (Optometry) Bary Castilla, Forest Gleason, MD (General Surgery) Schnier, Dolores Lory, MD (Vascular Surgery) Sindy Guadeloupe, MD as Consulting Physician (Oncology)   Name of the patient: Samuel Bryan  [id number]  08-27-1927   Date of visit: 10/18/20  Diagnosis- 1.Leukopenia/neutropenia likely benign 2.Normocytic anemia likely secondary to chronic kidney disease   Chief complaint/ Reason for visit-routine follow-up of anemia and leukopenia  Heme/Onc history: Patient is a 86 year old African-American male with a past medical history significant for CKD stage III, peripheral vascular disease, hypertension hyperlipidemia among other medical problems.He has been referred to [place] for evaluation of leukopenia. Recent CBC from 10/28/2018 showed white count of 3.7, H&H of 10.4/31.6 and a platelet count of 250. Looking back at his prior white count patient has had a few low values of 3.41 dating back to 2013 and his white count waxes and wanes. His hemoglobin of late has been around 10 likely secondary to his chronic kidney disease. He is active for his age and lives with his daughter. He is independent of his ADLs. Reports that he has a good appetite and denies any unintentional weight loss. Denies any recurrent infections or hospitalizations.    Interval history-patient is doing well for his age and has not had any recent hospitalizations or infections.  No recent falls.  ECOG PS- 1 Pain scale- 0   Review of systems- Review of Systems  Constitutional: Positive for malaise/fatigue. Negative for chills, fever and weight loss.  HENT: Negative for congestion, ear  discharge and nosebleeds.   Eyes: Negative for blurred vision.  Respiratory: Negative for cough, hemoptysis, sputum production, shortness of breath and wheezing.   Cardiovascular: Negative for chest pain, palpitations, orthopnea and claudication.  Gastrointestinal: Negative for abdominal pain, blood in stool, constipation, diarrhea, heartburn, melena, nausea and vomiting.  Genitourinary: Negative for dysuria, flank pain, frequency, hematuria and urgency.  Musculoskeletal: Negative for back pain, joint pain and myalgias.  Skin: Negative for rash.  Neurological: Negative for dizziness, tingling, focal weakness, seizures, weakness and headaches.  Endo/Heme/Allergies: Does not bruise/bleed easily.  Psychiatric/Behavioral: Negative for depression and suicidal ideas. The patient does not have insomnia.       No Known Allergies   Past Medical History:  Diagnosis Date  . Anemia   . Atherosclerosis of renal artery ([place])   . Essential hypertension, benign   . Occlusion and stenosis of carotid artery without mention of cerebral infarction    moderate left ICA stenosis  . Peripheral vascular disease, unspecified ([place])    mild lifestyle limiting claudication  . Pure hypercholesterolemia   . Renal insufficiency   . Type 2 diabetes mellitus ([place])      Past Surgical History:  Procedure Laterality Date  . APPENDECTOMY  1970's  . COLONOSCOPY    . coronary artery stenting  1999   at [facility]   . ENDARTERECTOMY Left 12/18/2017   Procedure: ENDARTERECTOMY CAROTID WITH PATCH;  Surgeon: Delana Meyer Dolores Lory, MD;  Location: [facility] ORS;  Service: Vascular;  Laterality: Left;  . FACIAL LACERATION REPAIR Left 12/13/2015   performed at [facility] by Dr. Marcial Pacas  . renal angiography    . VISCERAL ANGIOGRAPHY N/A 08/19/2018   Procedure: VISCERAL ANGIOGRAPHY;  Surgeon: Katha Cabal, MD;  Location:  [place] [facility];  Service: Cardiovascular;  Laterality: N/A;    Social History   Socioeconomic History   . Marital status: Widowed    Spouse name: Not on file  . Number of children: 5  . Years of education: 10  . Highest education level: Not on file  Occupational History  . Occupation: retired  Tobacco Use  . Smoking status: Former Smoker    Types: Cigarettes    Quit date: 10/29/1970    Years since quitting: 50.0  . Smokeless tobacco: Never Used  Vaping Use  . Vaping Use: Never used  Substance and Sexual Activity  . Alcohol use: No    Alcohol/week: 0.0 standard drinks  . Drug use: No  . Sexual activity: Not Currently  Other Topics Concern  . Not on file  Social History Narrative   widowed, all children are healthy.    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    Social Determinants of Health   Financial Resource Strain: Low Risk   . Difficulty of Paying Living Expenses: Not hard at all  Food Insecurity: No Food Insecurity  . Worried About Charity fundraiser in the Last Year: Never true  . Ran Out of Food in the Last Year: Never true  Transportation Needs: No Transportation Needs  . Lack of Transportation (Medical): No  . Lack of Transportation (Non-Medical): No  Physical Activity: Sufficiently Active  . Days of Exercise per Week: 5 days  . Minutes of Exercise per Session: 50 min  Stress: No Stress Concern Present  . Feeling of Stress : Not at all  Social Connections: Moderately Isolated  . Frequency of Communication with Friends and Family: More than three times a week  . Frequency of Social Gatherings with Friends and Family: Three times a week  . Attends Religious Services: More than 4 times per year  . Active Member of Clubs or Organizations: No  . Attends Archivist Meetings: Never  . Marital Status: Widowed  Intimate Partner Violence: Not At Risk  . Fear of Current or Ex-Partner: No  . Emotionally Abused: No  . Physically Abused: No  . Sexually Abused: No    Family History  Problem Relation Age of  Onset  . Diabetes Mother   . Heart disease Mother   . Hypertension Brother   . Hyperlipidemia Brother   . Diabetes Brother   . Hypertension Son   . Hypertension Son   . Birth defects Neg Hx   . Stroke Neg Hx      Current Outpatient Medications:  .  amLODipine (NORVASC) 2.5 MG tablet, , Disp: , Rfl:  .  aspirin EC 81 MG tablet, Take 81 mg by mouth daily. , Disp: , Rfl:  .  atorvastatin (LIPITOR) 40 MG tablet, Take 1 tablet (40 mg total) by mouth at bedtime., Disp: 90 tablet, Rfl: 1 .  Cholecalciferol (VITAMIN D3) 1.25 MG (50000 UT) TABS, Take 1 tablet by mouth daily., Disp: , Rfl:  .  clopidogrel (PLAVIX) 75 MG tablet, TAKE 1 TABLET DAILY. (Patient taking differently: Take 75 mg by mouth daily.), Disp: 90 tablet, Rfl: 0 .  docusate sodium (COLACE) 100 MG capsule, Take 100 mg by mouth daily as needed for mild constipation., Disp: , Rfl:  .  ferrous sulfate 325 (65 FE) MG EC tablet, Take 325 mg by mouth daily. , Disp: , Rfl:  .  furosemide (LASIX) 40 MG tablet, Take 40  mg by mouth daily. , Disp: , Rfl:  .  isosorbide mononitrate (IMDUR) 30 MG 24 hr tablet, Take 1 tablet (30 mg total) by mouth daily., Disp: , Rfl:  .  latanoprost (XALATAN) 0.005 % ophthalmic solution, Place 1 drop into both eyes at bedtime., Disp: , Rfl:  .  linagliptin (TRADJENTA) 5 MG TABS tablet, Take 1 tablet (5 mg total) by mouth daily. For diabetes, Disp: 90 tablet, Rfl: 3 .  losartan (COZAAR) 100 MG tablet, Take 100 mg by mouth daily., Disp: , Rfl:  .  Multiple Vitamin (MULTIVITAMIN) tablet, Take 1 tablet by mouth daily. , Disp: , Rfl:  .  Nutritional Supplements (ENSURE ACTIVE HIGH PROTEIN) LIQD, Take 1 Can by mouth in the morning and at bedtime., Disp: 14220 mL, Rfl: 5 .  timolol (BETIMOL) 0.5 % ophthalmic solution, Place 1 drop into both eyes 2 (two) times daily., Disp: 10 mL, Rfl: 12 .  timolol (TIMOPTIC) 0.5 % ophthalmic solution, , Disp: , Rfl:  .  vitamin B-12 (CYANOCOBALAMIN) 1000 MCG tablet, Take 1,000 mcg  by mouth daily., Disp: , Rfl:   Physical exam:  Vitals:   10/17/20 1047  BP: (!) 132/49  Pulse: 60  Resp: 16  Temp: 98.6 F (37 C)  TempSrc: Tympanic  SpO2: 99%  Weight: 135 lb (61.2 kg)   Physical Exam Constitutional:      General: He is not in acute distress. Eyes:     Extraocular Movements: EOM normal.  Cardiovascular:     Rate and Rhythm: Normal rate and regular rhythm.     Heart sounds: Normal heart sounds.  Pulmonary:     Effort: Pulmonary effort is normal.     Breath sounds: Normal breath sounds.  Abdominal:     General: Bowel sounds are normal.     Palpations: Abdomen is soft.  Skin:    General: Skin is warm and dry.  Neurological:     Mental Status: He is alert and oriented to person, place, and time.      CMP Latest Ref Rng & Units 03/18/2020  Glucose 65 - 99 mg/dL 126(H)  BUN 7 - 25 mg/dL 39(H)  Creatinine 0.70 - 1.11 mg/dL 1.82(H)  Sodium 135 - 146 mmol/L 141  Potassium 3.5 - 5.3 mmol/L 4.8  Chloride 98 - 110 mmol/L 102  CO2 20 - 32 mmol/L 33(H)  Calcium 8.6 - 10.3 mg/dL 9.5  Total Protein 6.1 - 8.1 g/dL 7.0  Total Bilirubin 0.2 - 1.2 mg/dL 0.4  Alkaline Phos 38 - 126 U/L -  AST 10 - 35 U/L 17  ALT 9 - 46 U/L 7(L)   CBC Latest Ref Rng & Units 04/18/2020  WBC 4.0 - 10.5 K/uL 6.8  Hemoglobin 13.0 - 17.0 g/dL 10.7(L)  Hematocrit 39.0 - 52.0 % 33.8(L)  Platelets 150 - 400 K/uL 180      Assessment and plan- Patient is a 86 y.o. male with chronic mild leukopenia normocytic anemia here for routine follow-up  1.  Normocytic anemia: Likely secondary to chronic kidney disease.  Patient's hemoglobin is remained stable Between 10-11 for the last 2 years.  He does have macrocytosis and there may be an element of MDS as well.  However given the stability of anemia no intervention is required at this time.  Also he does not require EPO given that his hemoglobin is greater than 9.  2.  Leukopenia/neutropenia:White cell count waxes and wanes between 3.5-8 with  an [place] that has gone down to 1.9  at times but overall it has remained stable with no consistent downward trend.  I would recommend continued follow-up with his primary care provider Delsa Grana at this time.  He can be referred to [place] in the future if questions or concerns arise   Visit Diagnosis 1. Anemia of chronic renal failure, unspecified CKD stage   2. Neutropenia, unspecified type [facility])      Dr. Randa Evens, MD, MPH [facility] [phone] 10/18/2020 8:40 AM

## 2020-10-26 DIAGNOSIS — R600 Localized edema: Secondary | ICD-10-CM | POA: Diagnosis not present

## 2020-10-26 DIAGNOSIS — I10 Essential (primary) hypertension: Secondary | ICD-10-CM | POA: Diagnosis not present

## 2020-10-26 DIAGNOSIS — R0602 Shortness of breath: Secondary | ICD-10-CM | POA: Diagnosis not present

## 2020-10-26 DIAGNOSIS — I25110 Atherosclerotic heart disease of native coronary artery with unstable angina pectoris: Secondary | ICD-10-CM | POA: Diagnosis not present

## 2020-10-26 DIAGNOSIS — R011 Cardiac murmur, unspecified: Secondary | ICD-10-CM | POA: Diagnosis not present

## 2020-10-26 DIAGNOSIS — I739 Peripheral vascular disease, unspecified: Secondary | ICD-10-CM | POA: Diagnosis not present

## 2020-10-26 DIAGNOSIS — N183 Chronic kidney disease, stage 3 unspecified: Secondary | ICD-10-CM | POA: Diagnosis not present

## 2020-10-26 DIAGNOSIS — E119 Type 2 diabetes mellitus without complications: Secondary | ICD-10-CM | POA: Diagnosis not present

## 2020-10-26 DIAGNOSIS — R001 Bradycardia, unspecified: Secondary | ICD-10-CM | POA: Diagnosis not present

## 2020-12-02 ENCOUNTER — Ambulatory Visit: Payer: Self-pay

## 2020-12-02 DIAGNOSIS — R059 Cough, unspecified: Secondary | ICD-10-CM | POA: Diagnosis not present

## 2020-12-02 DIAGNOSIS — J019 Acute sinusitis, unspecified: Secondary | ICD-10-CM | POA: Diagnosis not present

## 2020-12-02 DIAGNOSIS — J209 Acute bronchitis, unspecified: Secondary | ICD-10-CM | POA: Diagnosis not present

## 2020-12-02 DIAGNOSIS — N390 Urinary tract infection, site not specified: Secondary | ICD-10-CM | POA: Diagnosis not present

## 2020-12-02 DIAGNOSIS — B9689 Other specified bacterial agents as the cause of diseases classified elsewhere: Secondary | ICD-10-CM | POA: Diagnosis not present

## 2020-12-02 DIAGNOSIS — A499 Bacterial infection, unspecified: Secondary | ICD-10-CM | POA: Diagnosis not present

## 2020-12-02 DIAGNOSIS — R350 Frequency of micturition: Secondary | ICD-10-CM | POA: Diagnosis not present

## 2020-12-02 DIAGNOSIS — J9811 Atelectasis: Secondary | ICD-10-CM | POA: Diagnosis not present

## 2020-12-02 DIAGNOSIS — R053 Chronic cough: Secondary | ICD-10-CM | POA: Diagnosis not present

## 2020-12-02 NOTE — Telephone Encounter (Signed)
Daughter reports pt. Started having urinary frequency Wednesday. Has weakness as well. No availability today in the practice per Melissa. Instructed daughter to take pt. To UC. States she would think about it, but would like to be worked in Monday. Please advise.  Reason for Disposition . Urinating more frequently than usual (i.e., frequency)  Answer Assessment - Initial Assessment Questions 1. SYMPTOM: "What's the main symptom you're concerned about?" (e.g., frequency, incontinence)     Frequency 2. ONSET: "When did the  frequency start?"     Wednesday 3. PAIN: "Is there any pain?" If Yes, ask: "How bad is it?" (Scale: 1-10; mild, moderate, severe)     No 4. CAUSE: "What do you think is causing the symptoms?"     Unsure 5. OTHER SYMPTOMS: "Do you have any other symptoms?" (e.g., fever, flank pain, blood in urine, pain with urination)     Weakness 6. PREGNANCY: "Is there any chance you are pregnant?" "When was your last menstrual period?"     n/a  Protocols used: URINARY [facility]

## 2020-12-02 NOTE — Telephone Encounter (Signed)
Patient daughter inform to take to UC. We will put him on a cancellation list to call

## 2020-12-02 NOTE — Telephone Encounter (Signed)
Can you check for appointments on Monday

## 2020-12-02 NOTE — Telephone Encounter (Signed)
There are no availabilities. We will call the pt if there are any cancellations

## 2020-12-05 NOTE — Progress Notes (Signed)
Name: Samuel Bryan.   MRN: [id number]    DOB: February 01, 1927   Date:12/06/2020       Progress Note  Subjective  Chief Complaint   UTI Follow Up  HPI   Daughter, [name] , brought him in today. He went to Urgent Care Feb 4 th , he had a cough, urinary frequency, no dysuria. He is taking Levaquin 250 mg daily , Mucinex, tessalon Perles, and prednisone. He is feeling much better, states urinary frequency back to baseline, cough has resolved. Glucose at home has been under control in the 90's to low 100's. He states his appetite is poor but that is something that has been going on for a while. Explained we should check GFR since he was given Levaquin and has a history of CKI , he is staying hydrated. Denies nausea, vomiting, flank pain, fever or chills.   Malnutrition:  He lost over 20 lbs in 2018-01-27, when his wife died, he has lack of appetite, but weight has been stable since. Discussed small and frequent meals.   Patient Active Problem List   Diagnosis Date Noted  . Paronychia of great toe of right foot 10/10/2020  . Neutropenia, unspecified type ([place]) 05/28/2020  . Protein-calorie malnutrition ([place]) 04/21/2020  . Benign hypertensive kidney disease with chronic kidney disease 01/06/2020  . Secondary hyperparathyroidism of renal origin ([place]) 07/13/2019  . Iron deficiency anemia 11/24/2018  . Aortic atherosclerosis ([place]) 07/28/2018  . Cholelithiasis 07/28/2018  . Pancreatic lesion 07/28/2018  . Chronic mesenteric ischemia ([place]) 07/28/2018  . Full code status 01/23/2018  . Memory loss 01/23/2018  . Carotid stenosis, asymptomatic, left 12/18/2017  . Degenerative arthritis of left shoulder region 08/19/2017  . Microalbuminuria 08/19/2017  . Type 2 diabetes mellitus ([place]) 08/13/2017  . Preventative health care 12/11/2016  . Abdominal mass 04/11/2016  . Abdominal mass, right upper quadrant 04/03/2016  . Bilateral carotid bruits 04/03/2016  . Facial injury 10/26/2015  . 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 Arteriosclerosis of coronary artery 10/11/2015  . H/O angina pectoris 10/11/2015  . Hyperlipidemia 09/13/2015  . Chronic kidney disease, stage III (moderate) ([place]) 09/13/2015  . RENAL ATHEROSCLEROSIS 03/16/2009  . Essential hypertension 03/15/2009  . Coronary atherosclerosis 03/15/2009  . Carotid arterial disease ([place]) 03/15/2009  . Peripheral vascular disease ([place]) 03/15/2009  . Anemia in chronic kidney disease (CKD) 03/15/2009    Past Surgical History:  Procedure Laterality Date  . APPENDECTOMY  1970's  . COLONOSCOPY    . coronary artery stenting  27-Jan-1998   at [facility]   . ENDARTERECTOMY Left 12/18/2017   Procedure: ENDARTERECTOMY CAROTID WITH PATCH;  Surgeon: Delana Meyer Dolores Lory, MD;  Location: [facility] ORS;  Service: Vascular;  Laterality: Left;  . FACIAL LACERATION REPAIR Left 12/13/2015   performed at [facility] by Dr. Marcial Pacas  . renal angiography    . VISCERAL ANGIOGRAPHY N/A 08/19/2018   Procedure: VISCERAL ANGIOGRAPHY;  Surgeon: Katha Cabal, MD;  Location: [place] [facility];  Service: Cardiovascular;  Laterality: N/A;    Family History  Problem Relation Age of Onset  . Diabetes Mother   . Heart disease Mother   . Hypertension Brother   . Hyperlipidemia Brother   . Diabetes Brother   . Hypertension Son   . Hypertension Son   . Birth defects Neg Hx   . Stroke Neg Hx     Social History   Tobacco Use  . Smoking status: Former Smoker    Types: Cigarettes    Quit date:  10/29/1970    Years since quitting: 50.1  . Smokeless tobacco: Never Used  Substance Use Topics  . Alcohol use: No    Alcohol/week: 0.0 standard drinks     Current Outpatient Medications:  .  amLODipine (NORVASC) 2.5 MG tablet, , Disp: , Rfl:  .  aspirin EC 81 MG tablet, Take 81 mg by mouth daily. , Disp: , Rfl:  .  atorvastatin (LIPITOR) 40 MG tablet, Take 1 tablet (40 mg total) by mouth at bedtime., Disp: 90 tablet, Rfl: 1 .  benzonatate (TESSALON) 200 MG capsule, Take by  mouth., Disp: , Rfl:  .  Cholecalciferol (VITAMIN D3) 1.25 MG (50000 UT) TABS, Take 1 tablet by mouth daily., Disp: , Rfl:  .  clopidogrel (PLAVIX) 75 MG tablet, TAKE 1 TABLET DAILY. (Patient taking differently: Take 75 mg by mouth daily.), Disp: 90 tablet, Rfl: 0 .  docusate sodium (COLACE) 100 MG capsule, Take 100 mg by mouth daily as needed for mild constipation., Disp: , Rfl:  .  ferrous sulfate 325 (65 FE) MG EC tablet, Take 325 mg by mouth daily. , Disp: , Rfl:  .  Fluticasone-Sodium Chloride (DERMACINRX TICANASE PAK) 50-2.7 MCG/ACT-% THPK, Place into the nose., Disp: , Rfl:  .  furosemide (LASIX) 40 MG tablet, Take 20 mg by mouth daily., Disp: , Rfl:  .  isosorbide mononitrate (IMDUR) 30 MG 24 hr tablet, Take 1 tablet (30 mg total) by mouth daily., Disp: , Rfl:  .  latanoprost (XALATAN) 0.005 % ophthalmic solution, Place 1 drop into both eyes at bedtime., Disp: , Rfl:  .  levofloxacin (LEVAQUIN) 250 MG tablet, Take by mouth., Disp: , Rfl:  .  linagliptin (TRADJENTA) 5 MG TABS tablet, Take 1 tablet (5 mg total) by mouth daily. For diabetes, Disp: 90 tablet, Rfl: 3 .  losartan (COZAAR) 100 MG tablet, Take 50 mg by mouth daily., Disp: , Rfl:  .  Multiple Vitamin (MULTIVITAMIN) tablet, Take 1 tablet by mouth daily. , Disp: , Rfl:  .  Nutritional Supplements (ENSURE ACTIVE HIGH PROTEIN) LIQD, Take 1 Can by mouth in the morning and at bedtime., Disp: 14220 mL, Rfl: 5 .  predniSONE (DELTASONE) 10 MG tablet, Take by mouth., Disp: , Rfl:  .  timolol (BETIMOL) 0.5 % ophthalmic solution, Place 1 drop into both eyes 2 (two) times daily., Disp: 10 mL, Rfl: 12 .  timolol (TIMOPTIC) 0.5 % ophthalmic solution, , Disp: , Rfl:  .  vitamin B-12 (CYANOCOBALAMIN) 1000 MCG tablet, Take 1,000 mcg by mouth daily., Disp: , Rfl:   No Known Allergies  I personally reviewed active problem list, medication list, allergies, family history, social history with the patient/caregiver today.   ROS  Ten systems  reviewed and is negative except as mentioned in HPI   Objective  Vitals:   12/06/20 1003  BP: 128/60  Pulse: 82  Resp: 16  Temp: 97.7 F (36.5 C)  TempSrc: Oral  SpO2: 96%  Weight: 132 lb 12.8 oz (60.2 kg)  Height: '5\' 6"'$  (1.676 m)    Body mass index is 21.43 kg/m.  Physical Exam  Constitutional: Patient appears well-developed and malnourished , temporal waisting  No distress.  HEENT: head atraumatic, normocephalic, pupils equal and reactive to light, neck supple Cardiovascular: Normal rate, regular rhythm and normal heart sounds.  No murmur heard. No BLE edema. Pulmonary/Chest: Effort normal and breath sounds normal. No respiratory distress. Abdominal: Soft.  There is no tenderness. Psychiatric: Patient has a normal mood and affect. behavior is  normal. Judgment and thought content normal.  PHQ2/9: Depression screen [facility] 2/9 12/06/2020 07/12/2020 06/17/2020 04/21/2020 03/18/2020  Decreased Interest 0 0 0 0 0  Down, Depressed, Hopeless 0 0 0 0 0  PHQ - 2 Score 0 0 0 0 0  Altered sleeping - - 0 0 0  Tired, decreased energy - - 1 0 0  Change in appetite - - 1 0 0  Feeling bad or failure about yourself  - - 0 0 0  Trouble concentrating - - 0 0 0  Moving slowly or fidgety/restless - - 0 0 0  Suicidal thoughts - - 0 0 0  PHQ-9 Score - - 2 0 0  Difficult doing work/chores - - Not difficult at all Not difficult at all Not difficult at all  Some recent data might be hidden    phq 9 is negative   Fall Risk: Fall Risk  12/06/2020 07/12/2020 06/17/2020 04/21/2020 03/18/2020  Falls in the past year? 0 1 0 0 0  Number falls in past yr: 0 0 0 0 0  Injury with Fall? 0 0 0 0 0  Risk for fall due to : - History of fall(s) - - -  Follow up - Falls prevention discussed - - -     Functional Status Survey: Is the patient deaf or have difficulty hearing?: Yes Does the patient have difficulty seeing, even when wearing glasses/contacts?: No Does the patient have difficulty concentrating,  remembering, or making decisions?: Yes Does the patient have difficulty walking or climbing stairs?: No Does the patient have difficulty dressing or bathing?: No Does the patient have difficulty doing errands alone such as visiting a doctor's office or shopping?: No    Assessment & Plan  1. Cough  Improved with medication   2. Acute cystitis without hematuria  - BASIC METABOLIC PANEL WITH GFR  3. High risk medication use  - BASIC METABOLIC PANEL WITH GFR  4. Type 2 diabetes mellitus with stage 3b chronic kidney disease, without long-term current use of insulin (HCC)  - BASIC METABOLIC PANEL WITH GFR  5. Mild protein-calorie malnutrition (HCC)  Discussed adding small snacks throughout the day

## 2020-12-06 ENCOUNTER — Other Ambulatory Visit: Payer: Self-pay

## 2020-12-06 ENCOUNTER — Encounter: Payer: Self-pay | Admitting: Family Medicine

## 2020-12-06 ENCOUNTER — Ambulatory Visit: Payer: Medicare PPO | Admitting: Family Medicine

## 2020-12-06 VITALS — BP 128/60 | HR 82 | Temp 97.7°F | Resp 16 | Ht 66.0 in | Wt 132.8 lb

## 2020-12-06 DIAGNOSIS — E441 Mild protein-calorie malnutrition: Secondary | ICD-10-CM | POA: Diagnosis not present

## 2020-12-06 DIAGNOSIS — R059 Cough, unspecified: Secondary | ICD-10-CM

## 2020-12-06 DIAGNOSIS — N3000 Acute cystitis without hematuria: Secondary | ICD-10-CM

## 2020-12-06 DIAGNOSIS — E1122 Type 2 diabetes mellitus with diabetic chronic kidney disease: Secondary | ICD-10-CM | POA: Diagnosis not present

## 2020-12-06 DIAGNOSIS — Z79899 Other long term (current) drug therapy: Secondary | ICD-10-CM | POA: Diagnosis not present

## 2020-12-06 DIAGNOSIS — N1832 Chronic kidney disease, stage 3b: Secondary | ICD-10-CM | POA: Diagnosis not present

## 2020-12-06 DIAGNOSIS — R300 Dysuria: Secondary | ICD-10-CM

## 2020-12-07 ENCOUNTER — Other Ambulatory Visit: Payer: Self-pay | Admitting: Family Medicine

## 2020-12-07 ENCOUNTER — Telehealth: Payer: Self-pay

## 2020-12-07 DIAGNOSIS — E875 Hyperkalemia: Secondary | ICD-10-CM

## 2020-12-07 LAB — BASIC METABOLIC PANEL WITH GFR
BUN/Creatinine Ratio: 27 (calc) — ABNORMAL HIGH (ref 6–22)
BUN: 45 mg/dL — ABNORMAL HIGH (ref 7–25)
CO2: 31 mmol/L (ref 20–32)
Calcium: 9.6 mg/dL (ref 8.6–10.3)
Chloride: 103 mmol/L (ref 98–110)
Creat: 1.65 mg/dL — ABNORMAL HIGH (ref 0.70–1.11)
GFR, Est African American: 41 mL/min/{1.73_m2} — ABNORMAL LOW (ref >=60)
GFR, Est Non African American: 36 mL/min/{1.73_m2} — ABNORMAL LOW (ref >=60)
Glucose, Bld: 117 mg/dL — ABNORMAL HIGH (ref 65–99)
Potassium: 5.5 mmol/L — ABNORMAL HIGH (ref 3.5–5.3)
Sodium: 142 mmol/L (ref 135–146)

## 2020-12-07 NOTE — Telephone Encounter (Signed)
Copied from [place] [id number]. Topic: General - Other >> Dec 07, 2020  3:55 PM Yvette Rack wrote: Reason for CRM: Pt daughter [name] stated they had a missed call from the office. [name] also requests an update on pt lab results. Cb# [phone]

## 2020-12-07 NOTE — Telephone Encounter (Signed)
Tried returning call. No answer and v/m full.

## 2020-12-08 DIAGNOSIS — E875 Hyperkalemia: Secondary | ICD-10-CM | POA: Diagnosis not present

## 2020-12-09 LAB — POTASSIUM: Potassium: 5.1 mmol/L (ref 3.5–5.3)

## 2020-12-11 DIAGNOSIS — K5900 Constipation, unspecified: Secondary | ICD-10-CM | POA: Diagnosis not present

## 2020-12-11 DIAGNOSIS — R109 Unspecified abdominal pain: Secondary | ICD-10-CM | POA: Diagnosis not present

## 2020-12-11 DIAGNOSIS — R1084 Generalized abdominal pain: Secondary | ICD-10-CM | POA: Diagnosis not present

## 2020-12-13 ENCOUNTER — Telehealth: Payer: Self-pay

## 2020-12-13 NOTE — Telephone Encounter (Signed)
Copied from [place] [id number]. Topic: General - Inquiry >> Dec 13, 2020 10:07 AM Samuel Bryan D wrote: Reason for CRM: Pt's daughter Samuel Bryan called saying dad is very constipated and the have tried the Miralax but it hasn't helped.  CB#  [phone]

## 2020-12-13 NOTE — Telephone Encounter (Signed)
Copied from [place] [phone]. Topic: General - Inquiry >> Dec 13, 2020 10:07 AM Samuel Bryan wrote: Reason for CRM: Pt's daughter Samuel Bryan called saying dad is very constipated and the have tried the Miralax but it hasn't helped.  CB#  [phone]

## 2020-12-19 ENCOUNTER — Telehealth (INDEPENDENT_AMBULATORY_CARE_PROVIDER_SITE_OTHER): Payer: Self-pay

## 2020-12-19 ENCOUNTER — Ambulatory Visit: Payer: Medicare PPO | Admitting: Podiatry

## 2020-12-19 ENCOUNTER — Ambulatory Visit
Admission: RE | Admit: 2020-12-19 | Discharge: 2020-12-19 | Disposition: A | Discharge status: Home / Self Care | Payer: Medicare PPO | Source: Ambulatory Visit | Attending: Family Medicine | Admitting: Family Medicine

## 2020-12-19 ENCOUNTER — Other Ambulatory Visit: Payer: Self-pay

## 2020-12-19 ENCOUNTER — Telehealth: Payer: Self-pay | Admitting: Family Medicine

## 2020-12-19 ENCOUNTER — Encounter: Payer: Self-pay | Admitting: Family Medicine

## 2020-12-19 ENCOUNTER — Ambulatory Visit (INDEPENDENT_AMBULATORY_CARE_PROVIDER_SITE_OTHER): Payer: Medicare PPO | Admitting: Family Medicine

## 2020-12-19 VITALS — BP 122/72 | HR 96 | Temp 98.3°F | Resp 16 | Ht 66.0 in | Wt 132.6 lb

## 2020-12-19 DIAGNOSIS — D709 Neutropenia, unspecified: Secondary | ICD-10-CM

## 2020-12-19 DIAGNOSIS — E538 Deficiency of other specified B group vitamins: Secondary | ICD-10-CM

## 2020-12-19 DIAGNOSIS — N185 Chronic kidney disease, stage 5: Secondary | ICD-10-CM

## 2020-12-19 DIAGNOSIS — K5900 Constipation, unspecified: Secondary | ICD-10-CM | POA: Insufficient documentation

## 2020-12-19 DIAGNOSIS — I10 Essential (primary) hypertension: Secondary | ICD-10-CM

## 2020-12-19 DIAGNOSIS — I739 Peripheral vascular disease, unspecified: Secondary | ICD-10-CM | POA: Diagnosis not present

## 2020-12-19 DIAGNOSIS — Z23 Encounter for immunization: Secondary | ICD-10-CM | POA: Diagnosis not present

## 2020-12-19 DIAGNOSIS — R109 Unspecified abdominal pain: Secondary | ICD-10-CM

## 2020-12-19 DIAGNOSIS — N2581 Secondary hyperparathyroidism of renal origin: Secondary | ICD-10-CM

## 2020-12-19 DIAGNOSIS — N1832 Chronic kidney disease, stage 3b: Secondary | ICD-10-CM | POA: Diagnosis not present

## 2020-12-19 DIAGNOSIS — E1122 Type 2 diabetes mellitus with diabetic chronic kidney disease: Secondary | ICD-10-CM

## 2020-12-19 DIAGNOSIS — E162 Hypoglycemia, unspecified: Secondary | ICD-10-CM

## 2020-12-19 DIAGNOSIS — D689 Coagulation defect, unspecified: Secondary | ICD-10-CM | POA: Diagnosis not present

## 2020-12-19 LAB — POCT GLYCOSYLATED HEMOGLOBIN (HGB A1C): Hemoglobin A1C: 5.9 % — AB (ref 4.0–5.6)

## 2020-12-19 MED ORDER — LANCETS MISC: 0 refills | Status: DC

## 2020-12-19 MED ORDER — VITAMIN B-12 1000 MCG PO TABS: 1000.0000 ug | ORAL_TABLET | Freq: Every day | ORAL | 3 refills | Status: AC

## 2020-12-19 NOTE — Progress Notes (Signed)
Name: Daune Colgate.   MRN: [id number]    DOB: [birth year]   Date:12/19/2020       Progress Note  Chief Complaint  Patient presents with  . Hypertension  . Diabetes    6 month recheck     Subjective:   Jahking Lesser. is a 87 y.o. male, presents to clinic for routine f/up  DM:   Pt managing DM with tradjenta Reports good med compliance Pt has no SE from meds. Blood sugars - Every other day 59-111- feels tired with his lows and his daughter cannot get him to eat sometimes Denies: Polyuria, polydipsia, vision changes, neuropathy Recent pertinent labs: Lab Results  Component Value Date   HGBA1C 6.0 (H) 03/18/2020   HGBA1C 6.0 09/18/2019   HGBA1C 6.5 (H) 08/21/2018   Hypertension:  Currently managed by cardiology primarily - losartan 50, imdur 30, lasix 20, amlodipine 2.5 Pt reports good med compliance and denies any SE.   Blood pressure today is well controlled. BP Readings from Last 3 Encounters:  12/19/20 122/72  12/06/20 128/60  10/17/20 (!) 132/49   Pt denies CP, SOB, exertional sx, LE edema, palpitation, Ha's, visual disturbances, lightheadedness, hypotension, syncope. Dietary efforts for BP?  none  132/62 123/95 137/69- home BP monitoring noted by daughter  HLD - cardiology stopped lipitor 40 mg- defer to cardiologist Lab Results  Component Value Date   CHOL 130 03/18/2020   HDL 48 03/18/2020   LDLCALC 69 03/18/2020   TRIG 58 03/18/2020   CHOLHDL 2.7 03/18/2020    Just saw Dr. Ancil Boozer for Cough - BMP was done and potassium was high , but normal when repeated  He went to walk in clinic at [facility] 2/13  abd pain and constipation was dx with UTI and tx with levaquin 250 daily x 7d Walk in clinic did KUB noted constipation and stool burden - directed to take miralax or colace  He has passed some more stool but continues to have a little bit of intermittent abd pain, right now pt states he has no pain.    Anemia- iron deficiency and B12 deficiency - his  daughter states he was discharged from heme he continues to take daily iron supplement PO = H/H improved- also monitored by nephro and labs done at [facility] clinic a week ago showed H/H 12.2/38.8 Hemoglobin  Date Value Ref Range Status  04/18/2020 10.7 (L) 13.0 - 17.0 g/dL Final  03/18/2020 9.8 (L) 13.2 - 17.1 g/dL Final  01/12/2020 11.9 (L) 13.0 - 17.0 g/dL Final  11/19/2019 10.3 (L) 13.0 - 17.0 g/dL Final  04/09/2016 10.4 (L) 12.6 - 17.7 g/dL Final  11/11/2015 10.6 (L) 12.6 - 17.7 g/dL Final   HGB  Date Value Ref Range Status  04/26/2012 12.2 (L) 13.0 - 18.0 g/dL Final   CKD stage 3: managed by nephrology Renal function recent labs: 2/13/20220 GFR 49 ([facility]) 10/10/2020 eGFR 31 (nephrology) Lab Results  Component Value Date   GFRAA 41 (L) 12/06/2020   GFRAA 37 (L) 03/18/2020   GFRAA 48 (L) 01/12/2020    Lab Results  Component Value Date   CREATININE 1.65 (H) 12/06/2020   BUN 45 (H) 12/06/2020   NA 142 12/06/2020   K 5.1 12/08/2020   CL 103 12/06/2020   CO2 31 12/06/2020        Current Outpatient Medications:  .  amLODipine (NORVASC) 2.5 MG tablet, , Disp: , Rfl:  .  aspirin EC 81 MG tablet, Take 81 mg by  mouth daily. , Disp: , Rfl:  .  atorvastatin (LIPITOR) 40 MG tablet, Take 1 tablet (40 mg total) by mouth at bedtime., Disp: 90 tablet, Rfl: 1 .  Cholecalciferol (VITAMIN D3) 1.25 MG (50000 UT) TABS, Take 1 tablet by mouth daily., Disp: , Rfl:  .  clopidogrel (PLAVIX) 75 MG tablet, TAKE 1 TABLET DAILY. (Patient taking differently: Take 75 mg by mouth daily.), Disp: 90 tablet, Rfl: 0 .  ferrous sulfate 325 (65 FE) MG EC tablet, Take 325 mg by mouth daily. , Disp: , Rfl:  .  Fluticasone-Sodium Chloride (DERMACINRX TICANASE PAK) 50-2.7 MCG/ACT-% THPK, Place into the nose., Disp: , Rfl:  .  furosemide (LASIX) 40 MG tablet, Take 20 mg by mouth daily., Disp: , Rfl:  .  isosorbide mononitrate (IMDUR) 30 MG 24 hr tablet, Take 1 tablet (30 mg total) by mouth daily.,  Disp: , Rfl:  .  latanoprost (XALATAN) 0.005 % ophthalmic solution, Place 1 drop into both eyes at bedtime., Disp: , Rfl:  .  linagliptin (TRADJENTA) 5 MG TABS tablet, Take 1 tablet (5 mg total) by mouth daily. For diabetes, Disp: 90 tablet, Rfl: 3 .  losartan (COZAAR) 100 MG tablet, Take 50 mg by mouth daily., Disp: , Rfl:  .  Multiple Vitamin (MULTIVITAMIN) tablet, Take 1 tablet by mouth daily. , Disp: , Rfl:  .  Nutritional Supplements (ENSURE ACTIVE HIGH PROTEIN) LIQD, Take 1 Can by mouth in the morning and at bedtime., Disp: 14220 mL, Rfl: 5 .  polyethylene glycol (MIRALAX / GLYCOLAX) 17 g packet, Take 17 g by mouth daily., Disp: , Rfl:  .  timolol (BETIMOL) 0.5 % ophthalmic solution, Place 1 drop into both eyes 2 (two) times daily., Disp: 10 mL, Rfl: 12 .  timolol (TIMOPTIC) 0.5 % ophthalmic solution, , Disp: , Rfl:  .  vitamin B-12 (CYANOCOBALAMIN) 1000 MCG tablet, Take 1,000 mcg by mouth daily., Disp: , Rfl:  .  docusate sodium (COLACE) 100 MG capsule, Take 100 mg by mouth daily as needed for mild constipation. (Patient not taking: Reported on 12/19/2020), Disp: , Rfl:   Patient Active Problem List   Diagnosis Date Noted  . Paronychia of great toe of right foot 10/10/2020  . Neutropenia, unspecified type ([place]) 05/28/2020  . Protein-calorie malnutrition ([place]) 04/21/2020  . Benign hypertensive kidney disease with chronic kidney disease 01/06/2020  . Secondary hyperparathyroidism of renal origin ([place]) 07/13/2019  . Iron deficiency anemia 11/24/2018  . Aortic atherosclerosis ([place]) 07/28/2018  . Cholelithiasis 07/28/2018  . Pancreatic lesion 07/28/2018  . Chronic mesenteric ischemia ([place]) 07/28/2018  . Full code status 01/23/2018  . Memory loss 01/23/2018  . Carotid stenosis, asymptomatic, left 12/18/2017  . Degenerative arthritis of left shoulder region 08/19/2017  . Microalbuminuria 08/19/2017  . Type 2 diabetes mellitus ([place]) 08/13/2017  . Preventative health care 12/11/2016  .  Abdominal mass, right upper quadrant 04/03/2016  . Bilateral carotid bruits 04/03/2016  . Facial injury 10/26/2015  . 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 Arteriosclerosis of coronary artery 10/11/2015  . H/O angina pectoris 10/11/2015  . Hyperlipidemia 09/13/2015  . Chronic kidney disease, stage III (moderate) ([place]) 09/13/2015  . RENAL ATHEROSCLEROSIS 03/16/2009  . Essential hypertension 03/15/2009  . Coronary atherosclerosis 03/15/2009  . Carotid arterial disease ([place]) 03/15/2009  . Peripheral vascular disease ([place]) 03/15/2009  . Anemia in chronic kidney disease (CKD) 03/15/2009    Past Surgical History:  Procedure Laterality Date  . APPENDECTOMY  1970's  . COLONOSCOPY    .  coronary artery stenting  1999   at [facility]   . ENDARTERECTOMY Left 12/18/2017   Procedure: ENDARTERECTOMY CAROTID WITH PATCH;  Surgeon: Delana Meyer Dolores Lory, MD;  Location: [facility] ORS;  Service: Vascular;  Laterality: Left;  . FACIAL LACERATION REPAIR Left 12/13/2015   performed at [facility] by Dr. Marcial Pacas  . renal angiography    . VISCERAL ANGIOGRAPHY N/A 08/19/2018   Procedure: VISCERAL ANGIOGRAPHY;  Surgeon: Katha Cabal, MD;  Location: [place] [facility];  Service: Cardiovascular;  Laterality: N/A;    Family History  Problem Relation Age of Onset  . Diabetes Mother   . Heart disease Mother   . Hypertension Brother   . Hyperlipidemia Brother   . Diabetes Brother   . Hypertension Son   . Hypertension Son   . Birth defects Neg Hx   . Stroke Neg Hx     Social History   Tobacco Use  . Smoking status: Former Smoker    Types: Cigarettes    Quit date: 10/29/1970    Years since quitting: 50.1  . Smokeless tobacco: Never Used  Vaping Use  . Vaping Use: Never used  Substance Use Topics  . Alcohol use: No    Alcohol/week: 0.0 standard drinks  . Drug use: No     No Known Allergies  Health Maintenance  Topic Date Due  . INFLUENZA VACCINE  05/29/2020  . HEMOGLOBIN A1C  09/18/2020  .  OPHTHALMOLOGY EXAM  03/01/2021  . FOOT EXAM  06/17/2021  . TETANUS/TDAP  06/26/2021  . COVID-19 Vaccine  Completed  . PNA vac Low Risk Adult  Completed    Chart Review Today: I personally reviewed active problem list, medication list, allergies, family history, social history, health maintenance, notes from last encounter, lab results, imaging with the patient/caregiver today. Reviewed several encounters, lab results, xray results and OV progress notes from specialists -personally reviewed today spent more than 10 minutes doing so  Review of Systems  Constitutional: Negative.  Negative for appetite change and unexpected weight change.  HENT: Negative.   Eyes: Negative.   Respiratory: Negative.   Cardiovascular: Negative.   Gastrointestinal: Negative.  Negative for abdominal distention, blood in stool, nausea and vomiting.  Endocrine: Negative.   Genitourinary: Negative.   Musculoskeletal: Negative.   Skin: Negative.   Allergic/Immunologic: Negative.   Neurological: Negative.  Negative for dizziness, weakness and light-headedness.  Hematological: Negative.   Psychiatric/Behavioral: Negative.   All other systems reviewed and are negative.    Objective:   Vitals:   12/19/20 0840  BP: 122/72  Pulse: 96  Resp: 16  Temp: 98.3 F (36.8 C)  TempSrc: Oral  SpO2: 98%  Weight: 132 lb 9.6 oz (60.1 kg)  Height: '5\' 6"'  (1.676 m)    Body mass index is 21.4 kg/m.  Physical Exam Vitals and nursing note reviewed.  Constitutional:      Appearance: He is not ill-appearing, toxic-appearing or diaphoretic.     Comments: Elderly, frail and thin, well appearing male, looks stated age, pleasant, very HOH  HENT:     Head: Normocephalic and atraumatic.     Right Ear: There is no impacted cerumen.     Left Ear: There is no impacted cerumen.  Eyes:     General:        Right eye: No discharge.        Left eye: No discharge.  Cardiovascular:     Rate and Rhythm: Normal rate.     Pulses:  Radial pulses are 2+ on the right side and 2+ on the left side.     Heart sounds: Heart sounds are distant.  Pulmonary:     Effort: Pulmonary effort is normal. No tachypnea, accessory muscle usage, prolonged expiration, respiratory distress or retractions.     Breath sounds: Decreased air movement (throughout) present. No stridor. No wheezing, rhonchi or rales.  Abdominal:     General: Bowel sounds are normal. There is no distension.     Palpations: Abdomen is soft. There is no mass.     Tenderness: There is no abdominal tenderness. There is no right CVA tenderness, left CVA tenderness or guarding.  Skin:    General: Skin is warm and dry.     Coloration: Skin is not jaundiced or pale.  Neurological:     Mental Status: He is alert.  Psychiatric:        Mood and Affect: Mood normal.        Behavior: Behavior normal.         Assessment & Plan:   1. Type 2 diabetes mellitus with stage 3b chronic kidney disease, without long-term current use of insulin (HCC) Diabetes has been well controlled he is only taking Tradjenta 5 mg He is having some hypoglycemic episodes where he feels tired and his daughter has difficult time getting him to eat.   We will check A1c today and can possibly discontinue medications and continue to monitor Refill on lancets and strips will be called into his pharmacy - POCT glycosylated hemoglobin (Hb A1C)  2. Essential hypertension Managed primarily by cardiology his blood pressure was at goal today med list and doses that have been changed were updated BP Readings from Last 3 Encounters:  12/19/20 122/72  12/06/20 128/60  10/17/20 (!) 132/49    3. Hypoglycemia See above, encouraged him to have snacks with complex carbs and proteins every couple hours we have previously offered a dietitian consult -possibly DC Tradjenta  4. Need for influenza vaccination - Flu Vaccine QUAD High Dose(Fluad)  5. Abdominal pain, unspecified abdominal location No  pain on exam today, may be multifactorial Abdominal pain was in the setting of UTI and some dehydration and constipation the patient states that it has improved the daughter is concerned that he still has abdominal pain Encourage them to titrate MiraLAX to soft formed stools, make sure he is drinking enough water and has enough fiber in his diet Recheck KUB - DG Abd 1 View; Future - Ambulatory referral to Home Health  6. Constipation, unspecified constipation type See #5 - polyethylene glycol (MIRALAX / GLYCOLAX) 17 g packet; Take 17 g by mouth daily. - DG Abd 1 View; Future - Ambulatory referral to Home Health  7. Coagulation defect [facility]) Per specialist - Ambulatory referral to [place]  8. Neutropenia, unspecified type [facility]) Per hematology oncology-monitoring  9. Peripheral vascular disease [facility]) Per vascular specialist at [place] [facility]   10. Secondary hyperparathyroidism of renal origin [facility]) Per nephrology, recent labs and office visits per nephrology were reviewed today  11. Type 2 diabetes mellitus with stage 5 chronic kidney disease not on chronic dialysis, without long-term current use of insulin (HCC) See above - POCT glycosylated hemoglobin (Hb A1C) - Ambulatory referral to [place]. Vitamin B 12 deficiency Patient's daughter asked if I could prescribe the B12 supplement - vitamin B-12 (CYANOCOBALAMIN) 1000 MCG tablet; Take 1 tablet (1,000 mcg total) by mouth daily.  Dispense: 90 tablet; Refill: 3  Return in about 6 months (around 06/18/2021).   Delsa Grana, PA-C 12/19/20 8:44 AM

## 2020-12-19 NOTE — Telephone Encounter (Signed)
Pt daughter Samuel Bryan is calling regarding imaging results completed today.   [name] also has not heard anything from palliative care. Please advise [phone]

## 2020-12-19 NOTE — Telephone Encounter (Signed)
Patient daughter left a voicemail stated that her father is having stomach pain sometimes. The patient had bladder infection,bowel problems, but each has been resolve. The patient went to [facility] today for x-rays and is waiting on results. I have made Dr Delana Meyer aware and for right now we will wait on the results . If patient calls with continuing stomach pain before his next visit his appointment can be moved up.

## 2020-12-20 NOTE — Telephone Encounter (Signed)
Patient notified office only sent in lancets and B-12. The Plavix was prescribed by Dr.Schneir

## 2020-12-20 NOTE — Telephone Encounter (Signed)
Pt daughter [name] called and asked about what blood thinner meds were sent to pharmacy and why/ she also wants to know about the stomach xray/ please advise

## 2020-12-21 ENCOUNTER — Telehealth: Payer: Self-pay | Admitting: Family Medicine

## 2020-12-21 NOTE — Telephone Encounter (Signed)
Cordelia Pen team assisted  with [facility] is calling to let leisa know the pt does not want to have in home nursing service today . Pt will have in home nursing service tomorrow

## 2020-12-22 DIAGNOSIS — D631 Anemia in chronic kidney disease: Secondary | ICD-10-CM | POA: Diagnosis not present

## 2020-12-22 DIAGNOSIS — M19012 Primary osteoarthritis, left shoulder: Secondary | ICD-10-CM | POA: Diagnosis not present

## 2020-12-22 DIAGNOSIS — N1832 Chronic kidney disease, stage 3b: Secondary | ICD-10-CM | POA: Diagnosis not present

## 2020-12-22 DIAGNOSIS — D509 Iron deficiency anemia, unspecified: Secondary | ICD-10-CM | POA: Diagnosis not present

## 2020-12-22 DIAGNOSIS — I129 Hypertensive chronic kidney disease with stage 1 through stage 4 chronic kidney disease, or unspecified chronic kidney disease: Secondary | ICD-10-CM | POA: Diagnosis not present

## 2020-12-22 DIAGNOSIS — E1122 Type 2 diabetes mellitus with diabetic chronic kidney disease: Secondary | ICD-10-CM | POA: Diagnosis not present

## 2020-12-22 DIAGNOSIS — N2581 Secondary hyperparathyroidism of renal origin: Secondary | ICD-10-CM | POA: Diagnosis not present

## 2020-12-22 DIAGNOSIS — E1151 Type 2 diabetes mellitus with diabetic peripheral angiopathy without gangrene: Secondary | ICD-10-CM | POA: Diagnosis not present

## 2020-12-22 DIAGNOSIS — I2510 Atherosclerotic heart disease of native coronary artery without angina pectoris: Secondary | ICD-10-CM | POA: Diagnosis not present

## 2020-12-22 NOTE — Telephone Encounter (Signed)
Leisa notified

## 2020-12-27 DIAGNOSIS — N1832 Chronic kidney disease, stage 3b: Secondary | ICD-10-CM | POA: Diagnosis not present

## 2020-12-27 DIAGNOSIS — E1122 Type 2 diabetes mellitus with diabetic chronic kidney disease: Secondary | ICD-10-CM | POA: Diagnosis not present

## 2020-12-27 DIAGNOSIS — R809 Proteinuria, unspecified: Secondary | ICD-10-CM | POA: Diagnosis not present

## 2020-12-27 DIAGNOSIS — D631 Anemia in chronic kidney disease: Secondary | ICD-10-CM | POA: Diagnosis not present

## 2020-12-27 DIAGNOSIS — N2581 Secondary hyperparathyroidism of renal origin: Secondary | ICD-10-CM | POA: Diagnosis not present

## 2020-12-27 DIAGNOSIS — I129 Hypertensive chronic kidney disease with stage 1 through stage 4 chronic kidney disease, or unspecified chronic kidney disease: Secondary | ICD-10-CM | POA: Diagnosis not present

## 2020-12-29 ENCOUNTER — Other Ambulatory Visit (INDEPENDENT_AMBULATORY_CARE_PROVIDER_SITE_OTHER): Payer: Self-pay | Admitting: Vascular Surgery

## 2020-12-29 DIAGNOSIS — K551 Chronic vascular disorders of intestine: Secondary | ICD-10-CM

## 2020-12-29 DIAGNOSIS — I70223 Atherosclerosis of native arteries of extremities with rest pain, bilateral legs: Secondary | ICD-10-CM

## 2021-01-02 ENCOUNTER — Ambulatory Visit (INDEPENDENT_AMBULATORY_CARE_PROVIDER_SITE_OTHER): Payer: Medicare PPO | Admitting: Vascular Surgery

## 2021-01-02 ENCOUNTER — Ambulatory Visit: Payer: Medicare PPO | Admitting: Podiatry

## 2021-01-02 ENCOUNTER — Encounter (INDEPENDENT_AMBULATORY_CARE_PROVIDER_SITE_OTHER): Payer: Medicare PPO

## 2021-01-03 DIAGNOSIS — H2511 Age-related nuclear cataract, right eye: Secondary | ICD-10-CM | POA: Diagnosis not present

## 2021-01-03 DIAGNOSIS — E119 Type 2 diabetes mellitus without complications: Secondary | ICD-10-CM | POA: Diagnosis not present

## 2021-01-03 DIAGNOSIS — H401131 Primary open-angle glaucoma, bilateral, mild stage: Secondary | ICD-10-CM | POA: Diagnosis not present

## 2021-01-06 ENCOUNTER — Encounter: Payer: Self-pay | Admitting: Family Medicine

## 2021-01-12 ENCOUNTER — Encounter (INDEPENDENT_AMBULATORY_CARE_PROVIDER_SITE_OTHER): Payer: Medicare PPO

## 2021-01-12 ENCOUNTER — Ambulatory Visit (INDEPENDENT_AMBULATORY_CARE_PROVIDER_SITE_OTHER): Payer: Medicare PPO | Admitting: Vascular Surgery

## 2021-01-12 ENCOUNTER — Other Ambulatory Visit (INDEPENDENT_AMBULATORY_CARE_PROVIDER_SITE_OTHER): Payer: Self-pay | Admitting: Vascular Surgery

## 2021-01-16 ENCOUNTER — Ambulatory Visit (INDEPENDENT_AMBULATORY_CARE_PROVIDER_SITE_OTHER): Payer: Medicare PPO

## 2021-01-16 ENCOUNTER — Encounter (INDEPENDENT_AMBULATORY_CARE_PROVIDER_SITE_OTHER): Payer: Self-pay | Admitting: Vascular Surgery

## 2021-01-16 ENCOUNTER — Ambulatory Visit (INDEPENDENT_AMBULATORY_CARE_PROVIDER_SITE_OTHER): Payer: Medicare PPO | Admitting: Vascular Surgery

## 2021-01-16 ENCOUNTER — Other Ambulatory Visit (INDEPENDENT_AMBULATORY_CARE_PROVIDER_SITE_OTHER): Payer: Self-pay | Admitting: Vascular Surgery

## 2021-01-16 ENCOUNTER — Other Ambulatory Visit: Payer: Self-pay

## 2021-01-16 ENCOUNTER — Telehealth: Payer: Self-pay

## 2021-01-16 VITALS — BP 134/65 | HR 81 | Resp 16 | Wt 132.4 lb

## 2021-01-16 DIAGNOSIS — K551 Chronic vascular disorders of intestine: Secondary | ICD-10-CM

## 2021-01-16 DIAGNOSIS — I10 Essential (primary) hypertension: Secondary | ICD-10-CM | POA: Diagnosis not present

## 2021-01-16 DIAGNOSIS — I779 Disorder of arteries and arterioles, unspecified: Secondary | ICD-10-CM

## 2021-01-16 DIAGNOSIS — I2510 Atherosclerotic heart disease of native coronary artery without angina pectoris: Secondary | ICD-10-CM

## 2021-01-16 DIAGNOSIS — I739 Peripheral vascular disease, unspecified: Secondary | ICD-10-CM | POA: Diagnosis not present

## 2021-01-16 DIAGNOSIS — Z95820 Peripheral vascular angioplasty status with implants and grafts: Secondary | ICD-10-CM

## 2021-01-16 NOTE — Telephone Encounter (Signed)
Copied from [address] [id number]. Topic: General - Other >> Jan 13, 2021  4:58 PM Tessa Lerner A wrote: Reason for CRM: Patient's daughter has made contact requesting a prescription for benzonatate (TESSALON PERLES) 200 MG capsules  Patient has been prescribed the medication previously and says that it was very effective for treating their cough  Patient's daughter declined to make an appointment at the time of call with agent  Please contact further if needed

## 2021-01-16 NOTE — Progress Notes (Signed)
MRN : [id number]  Samuel Bryan. is a 87 y.o. (1927/01/17) male who presents with chief complaint of  Chief Complaint  Patient presents with   Follow-up    6 month follow up  .  History of Present Illness:   The patient returns to the office for followup and review status post angiogram with interventionon 08/20/2019.  Procedure(s) Performed: 1. Abdominal aortogram 2. Bilateral iliofemoralrunoff 3. Percutaneous transluminal angioplasty and stent placement right common iliac artery; "kissing balloon" technique 4. Percutaneous transluminal and plasty and stent placement left common iliac artery; "kissing balloon" technique 5. Ultrasound guided access bilateral common femoral arteries 6.Introduction catheter into celiac artery 7.Percutaneous transluminal angioplasty and stent placement celiac artery 8.StarClose closure device bilateral common femoral arteries  The patient notes his lower extremities are about the same he states may be his right is a little worse. No major interval shortening of the patient's claudication distance and he denies rest pain symptoms. No new ulcers or wounds have occurred since the last visit.  There have been no significant changes to the patient's overall health care.  He is s/p left CEA 12/18/2017.The patient denies amaurosis fugax or recent TIA symptoms. There are no recent neurological changes noted. The patient denies history of DVT, PE or superficial thrombophlebitis. The patient denies recent episodes of angina or shortness of breath.   ABI's Rt=NCand Lt=[place]; triphasic posterior tibial signals bilaterally(previous Rt=NCand Lt=[place])  Duplex ultrasound of the abdominal aorta demonstrates patent iliac aortoiliac system with moderate stenosis of the right common iliac and mild stenosis of the left common iliac this appears  unchanged compared to previous studies.  Duplex ultrasound the mesenteric arteries demonstrate widely patent celiac and SMA.  IMA demonstrates high-grade stenosis at its origin  Current Meds  Medication Sig   amLODipine (NORVASC) 2.5 MG tablet    aspirin EC 81 MG tablet Take 81 mg by mouth daily.    atorvastatin (LIPITOR) 40 MG tablet Take 1 tablet (40 mg total) by mouth at bedtime.   Cholecalciferol (VITAMIN D3) 1.25 MG (50000 UT) TABS Take 1 tablet by mouth daily.   clopidogrel (PLAVIX) 75 MG tablet TAKE 1 TABLET DAILY. (Patient taking differently: Take 75 mg by mouth daily.)   ferrous sulfate 325 (65 FE) MG EC tablet Take 325 mg by mouth daily.    Fluticasone-Sodium Chloride (DERMACINRX TICANASE PAK) 50-2.7 MCG/ACT-% THPK Place into the nose.   furosemide (LASIX) 40 MG tablet Take 20 mg by mouth daily.   guaifenesin (HUMIBID E) 400 MG TABS tablet Take 400 mg by mouth as directed.   isosorbide mononitrate (IMDUR) 30 MG 24 hr tablet Take 1 tablet (30 mg total) by mouth daily.   Lancets MISC Check BS qd   latanoprost (XALATAN) 0.005 % ophthalmic solution Place 1 drop into both eyes at bedtime.   linagliptin (TRADJENTA) 5 MG TABS tablet Take 1 tablet (5 mg total) by mouth daily. For diabetes   losartan (COZAAR) 100 MG tablet Take 50 mg by mouth daily.   Multiple Vitamin (MULTIVITAMIN) tablet Take 1 tablet by mouth daily.    Nutritional Supplements (ENSURE ACTIVE HIGH PROTEIN) LIQD Take 1 Can by mouth in the morning and at bedtime.   polyethylene glycol (MIRALAX / GLYCOLAX) 17 g packet Take 17 g by mouth daily.   timolol (BETIMOL) 0.5 % ophthalmic solution Place 1 drop into both eyes 2 (two) times daily.   timolol (TIMOPTIC) 0.5 % ophthalmic solution    vitamin B-12 (CYANOCOBALAMIN) 1000 MCG tablet Take  1 tablet (1,000 mcg total) by mouth daily.    Past Medical History:  Diagnosis Date   Anemia    Atherosclerosis of renal artery (HCC)    Essential hypertension,  benign    Occlusion and stenosis of carotid artery without mention of cerebral infarction    moderate left ICA stenosis   Peripheral vascular disease, unspecified (HCC)    mild lifestyle limiting claudication   Pure hypercholesterolemia    Renal insufficiency    Type 2 diabetes mellitus ([place])     Past Surgical History:  Procedure Laterality Date   APPENDECTOMY  1970's   COLONOSCOPY     coronary artery stenting  1999   at [place] Left 12/18/2017   Procedure: ENDARTERECTOMY CAROTID WITH PATCH;  Surgeon: Katha Cabal, MD;  Location: [facility] ORS;  Service: Vascular;  Laterality: Left;   FACIAL LACERATION REPAIR Left 12/13/2015   performed at [facility] by Dr. Marcial Pacas   renal angiography     VISCERAL ANGIOGRAPHY N/A 08/19/2018   Procedure: VISCERAL ANGIOGRAPHY;  Surgeon: Katha Cabal, MD;  Location: [place] [facility];  Service: Cardiovascular;  Laterality: N/A;    Social History Social History   Tobacco Use   Smoking status: Former Smoker    Types: Cigarettes    Quit date: 10/29/1970    Years since quitting: 50.2   Smokeless tobacco: Never Used  Vaping Use   Vaping Use: Never used  Substance Use Topics   Alcohol use: No    Alcohol/week: 0.0 standard drinks   Drug use: No    Family History Family History  Problem Relation Age of Onset   Diabetes Mother    Heart disease Mother    Hypertension Brother    Hyperlipidemia Brother    Diabetes Brother    Hypertension Son    Hypertension Son    Birth defects Neg Hx    Stroke Neg Hx     No Known Allergies   REVIEW OF SYSTEMS (Negative unless checked)  Constitutional: '[]'$ Weight loss  '[]'$ Fever  '[]'$ Chills Cardiac: '[]'$ Chest pain   '[]'$ Chest pressure   '[]'$ Palpitations   '[]'$ Shortness of breath when laying flat   '[]'$ Shortness of breath with exertion. Vascular:  '[x]'$ Pain in legs with walking   '[]'$ Pain in legs at rest  '[]'$ History of DVT   '[]'$ Phlebitis   '[]'$ Swelling in legs   '[]'$ Varicose veins    '[]'$ Non-healing ulcers Pulmonary:   '[]'$ Uses home oxygen   '[]'$ Productive cough   '[]'$ Hemoptysis   '[]'$ Wheeze  '[]'$ COPD   '[]'$ Asthma Neurologic:  '[]'$ Dizziness   '[]'$ Seizures   '[]'$ History of stroke   '[]'$ History of TIA  '[]'$ Aphasia   '[]'$ Vissual changes   '[]'$ Weakness or numbness in arm   '[]'$ Weakness or numbness in leg Musculoskeletal:   '[]'$ Joint swelling   '[x]'$ Joint pain   '[x]'$ Low back pain Hematologic:  '[]'$ Easy bruising  '[]'$ Easy bleeding   '[]'$ Hypercoagulable state   '[]'$ Anemic Gastrointestinal:  '[]'$ Diarrhea   '[]'$ Vomiting  '[]'$ Gastroesophageal reflux/heartburn   '[]'$ Difficulty swallowing. Genitourinary:  '[x]'$ Chronic kidney disease   '[]'$ Difficult urination  '[]'$ Frequent urination   '[]'$ Blood in urine Skin:  '[]'$ Rashes   '[]'$ Ulcers  Psychological:  '[]'$ History of anxiety   '[]'$  History of major depression.  Physical Examination  Vitals:   01/16/21 0840  BP: 134/65  Pulse: 81  Resp: 16  Weight: 132 lb 6.4 oz (60.1 kg)   Body mass index is 21.37 kg/m. Gen: WD/WN, NAD Head: [place]/AT, No temporalis wasting.  Ear/Nose/Throat: Hearing grossly intact, nares w/o erythema or drainage  Eyes: PER, EOMI, sclera nonicteric.  Neck: Supple, no large masses.   Pulmonary:  Good air movement, no audible wheezing bilaterally, no use of accessory muscles.  Cardiac: RRR, no JVD Vascular:  Vessel Right Left  Radial Palpable Palpable  Carotid Palpable Palpable  PT  not palpable  not palpable  DP Palpable  not palpable  Gastrointestinal: Non-distended. No guarding/no peritoneal signs.  Musculoskeletal: M/S 5/5 throughout.  No deformity or atrophy.  Neurologic: CN 2-12 intact. Symmetrical.  Speech is fluent. Motor exam as listed above. Psychiatric: Judgment intact, Mood & affect appropriate for pt's clinical situation. Dermatologic: No rashes or ulcers noted.  No changes consistent with cellulitis.  CBC Lab Results  Component Value Date   WBC 6.8 04/18/2020   HGB 10.7 (L) 04/18/2020   HCT 33.8 (L) 04/18/2020   MCV 103.0 (H) 04/18/2020   PLT 180  04/18/2020    BMET    Component Value Date/Time   NA 142 12/06/2020 1028   NA 137 04/09/2016 1008   NA 137 04/26/2012 1145   K 5.1 12/08/2020 0811   K 4.0 04/26/2012 1145   CL 103 12/06/2020 1028   CL 104 04/26/2012 1145   CO2 31 12/06/2020 1028   CO2 29 04/26/2012 1145   GLUCOSE 117 (H) 12/06/2020 1028   GLUCOSE 105 (H) 04/26/2012 1145   BUN 45 (H) 12/06/2020 1028   BUN 19 04/09/2016 1008   BUN 16 04/26/2012 1145   CREATININE 1.65 (H) 12/06/2020 1028   CALCIUM 9.6 12/06/2020 1028   CALCIUM 8.9 04/26/2012 1145   GFRNONAA 36 (L) 12/06/2020 1028   GFRAA 41 (L) 12/06/2020 1028   CrCl cannot be calculated (Patient's most recent lab result is older than the maximum 21 days allowed.).  COAG Lab Results  Component Value Date   INR 1.06 12/10/2017    Radiology DG Abd 1 View  Result Date: 12/19/2020 CLINICAL DATA:  Constipation, abdominal pain. EXAM: ABDOMEN - 1 VIEW COMPARISON:  None. FINDINGS: No abnormal bowel dilatation is noted. Moderate amount of stool seen throughout the colon. No radio-opaque calculi or other significant radiographic abnormality are seen. IMPRESSION: Moderate stool burden.  No abnormal bowel dilatation. Electronically Signed   By: Marijo Conception M.D.   On: 12/19/2020 15:00    Assessment/Plan 1. Peripheral vascular disease ([place])  Recommend:  The patient has evidence of atherosclerosis of the lower extremities with claudication.  The patient does not voice lifestyle limiting changes at this point in time.  Noninvasive studies do not suggest major change although the right seems to be a bit worse.  Because of this I will see him back in 6 months rather than a year.  No invasive studies, angiography or surgery at this time The patient should continue walking and begin a more formal exercise program.  The patient should continue antiplatelet therapy and aggressive treatment of the lipid abnormalities  No changes in the patient's medications at this  time  The patient should continue wearing graduated compression socks 10-15 mmHg strength to control the mild edema.    2. Bilateral carotid artery disease, unspecified type ([place]) Recommend:  Given the patient's asymptomatic subcritical stenosis no further invasive testing or surgery at this time.  Continue antiplatelet therapy as prescribed Continue management of CAD, HTN and Hyperlipidemia Healthy heart diet,  encouraged exercise at least 4 times per week Follow up in 12 months with duplex ultrasound and physical exam   3. Chronic mesenteric ischemia (HCC) Recommend:  The patient has evidence  of chronic asymptomatic mesenteric atherosclerosis.  The patient denies lifestyle limiting changes at this point in time.  Given the lack of symptoms no intervention is warranted at this time.   No further invasive studies, angiography or surgery at this time  The patient should continue walking and begin a more formal exercise program.   The patient should continue antiplatelet therapy and aggressive treatment of the lipid abnormalities  Patient should undergo noninvasive studies as ordered. The patient will follow up with me after the studies.    4. Atherosclerosis of native coronary artery of native heart without angina pectoris Continue cardiac and antihypertensive medications as already ordered and reviewed, no changes at this time.  Continue statin as ordered and reviewed, no changes at this time  Nitrates PRN for chest pain   5. Essential hypertension Continue antihypertensive medications as already ordered, these medications have been reviewed and there are no changes at this time.    Hortencia Pilar, MD  01/16/2021 8:53 AM

## 2021-01-17 ENCOUNTER — Encounter (INDEPENDENT_AMBULATORY_CARE_PROVIDER_SITE_OTHER): Payer: Self-pay | Admitting: Vascular Surgery

## 2021-01-17 NOTE — Telephone Encounter (Signed)
Not able to lvm pt need to schedule a virtual appt

## 2021-01-25 DIAGNOSIS — E119 Type 2 diabetes mellitus without complications: Secondary | ICD-10-CM | POA: Diagnosis not present

## 2021-01-25 DIAGNOSIS — R0602 Shortness of breath: Secondary | ICD-10-CM | POA: Diagnosis not present

## 2021-01-25 DIAGNOSIS — R001 Bradycardia, unspecified: Secondary | ICD-10-CM | POA: Diagnosis not present

## 2021-01-25 DIAGNOSIS — I10 Essential (primary) hypertension: Secondary | ICD-10-CM | POA: Diagnosis not present

## 2021-01-25 DIAGNOSIS — I25110 Atherosclerotic heart disease of native coronary artery with unstable angina pectoris: Secondary | ICD-10-CM | POA: Diagnosis not present

## 2021-01-25 DIAGNOSIS — R600 Localized edema: Secondary | ICD-10-CM | POA: Diagnosis not present

## 2021-01-25 DIAGNOSIS — N183 Chronic kidney disease, stage 3 unspecified: Secondary | ICD-10-CM | POA: Diagnosis not present

## 2021-01-25 DIAGNOSIS — R011 Cardiac murmur, unspecified: Secondary | ICD-10-CM | POA: Diagnosis not present

## 2021-01-25 DIAGNOSIS — I739 Peripheral vascular disease, unspecified: Secondary | ICD-10-CM | POA: Diagnosis not present

## 2021-02-03 ENCOUNTER — Other Ambulatory Visit: Payer: Self-pay | Admitting: Emergency Medicine

## 2021-02-03 ENCOUNTER — Telehealth: Payer: Self-pay

## 2021-02-03 DIAGNOSIS — E1122 Type 2 diabetes mellitus with diabetic chronic kidney disease: Secondary | ICD-10-CM

## 2021-02-03 DIAGNOSIS — N1832 Chronic kidney disease, stage 3b: Secondary | ICD-10-CM

## 2021-02-03 MED ORDER — CONTOUR NEXT TEST VI STRP: ORAL_STRIP | 6 refills | Status: DC

## 2021-02-03 NOTE — Telephone Encounter (Signed)
Spoke to [facility] patient is on contour next test strips. This was called to pharmacy.

## 2021-02-03 NOTE — Telephone Encounter (Signed)
Need name of strips. Did not see name on chart

## 2021-02-03 NOTE — Telephone Encounter (Signed)
Call [facility] for name of strips

## 2021-02-03 NOTE — Telephone Encounter (Signed)
Copied from [place] [phone]. Topic: General - Other >> Feb 02, 2021  4:57 PM Tessa Lerner A wrote: Reason for CRM: Patient's daughter has mae contact requesting a prescription for patient's diabetic testing supplies be sent to their preeffered pharmacy  Patient is in need of lancets and strips. Patient's daughter has provided the following order numbers for the lancets and strips  Strips [id number]  Lancets [id number]  Patient's daughter declined to make an appointment for patient to be seen in office, at the time of call with agent  Please contact to further advise if needed

## 2021-03-31 ENCOUNTER — Inpatient Hospital Stay
Admission: EM | Admit: 2021-03-31 | Discharge: 2021-04-28 | DRG: 682 | Disposition: E | Payer: Medicare PPO | Source: Ambulatory Visit | Attending: Pulmonary Disease | Admitting: Pulmonary Disease

## 2021-03-31 ENCOUNTER — Other Ambulatory Visit: Payer: Self-pay

## 2021-03-31 ENCOUNTER — Observation Stay: Payer: Medicare PPO

## 2021-03-31 ENCOUNTER — Ambulatory Visit: Payer: Self-pay

## 2021-03-31 ENCOUNTER — Emergency Department: Payer: Medicare PPO

## 2021-03-31 DIAGNOSIS — E870 Hyperosmolality and hypernatremia: Secondary | ICD-10-CM | POA: Diagnosis not present

## 2021-03-31 DIAGNOSIS — W19XXXA Unspecified fall, initial encounter: Secondary | ICD-10-CM | POA: Diagnosis present

## 2021-03-31 DIAGNOSIS — Z0189 Encounter for other specified special examinations: Secondary | ICD-10-CM

## 2021-03-31 DIAGNOSIS — I4891 Unspecified atrial fibrillation: Secondary | ICD-10-CM | POA: Diagnosis not present

## 2021-03-31 DIAGNOSIS — Z833 Family history of diabetes mellitus: Secondary | ICD-10-CM

## 2021-03-31 DIAGNOSIS — N1831 Chronic kidney disease, stage 3a: Secondary | ICD-10-CM | POA: Diagnosis present

## 2021-03-31 DIAGNOSIS — G928 Other toxic encephalopathy: Secondary | ICD-10-CM | POA: Diagnosis present

## 2021-03-31 DIAGNOSIS — I6521 Occlusion and stenosis of right carotid artery: Secondary | ICD-10-CM | POA: Diagnosis present

## 2021-03-31 DIAGNOSIS — N189 Chronic kidney disease, unspecified: Secondary | ICD-10-CM | POA: Diagnosis not present

## 2021-03-31 DIAGNOSIS — J439 Emphysema, unspecified: Secondary | ICD-10-CM | POA: Diagnosis present

## 2021-03-31 DIAGNOSIS — N179 Acute kidney failure, unspecified: Secondary | ICD-10-CM | POA: Diagnosis not present

## 2021-03-31 DIAGNOSIS — G9340 Encephalopathy, unspecified: Secondary | ICD-10-CM | POA: Diagnosis not present

## 2021-03-31 DIAGNOSIS — I2510 Atherosclerotic heart disease of native coronary artery without angina pectoris: Secondary | ICD-10-CM | POA: Diagnosis not present

## 2021-03-31 DIAGNOSIS — E8809 Other disorders of plasma-protein metabolism, not elsewhere classified: Secondary | ICD-10-CM | POA: Diagnosis present

## 2021-03-31 DIAGNOSIS — R0602 Shortness of breath: Secondary | ICD-10-CM | POA: Diagnosis present

## 2021-03-31 DIAGNOSIS — E785 Hyperlipidemia, unspecified: Secondary | ICD-10-CM | POA: Diagnosis present

## 2021-03-31 DIAGNOSIS — Z515 Encounter for palliative care: Secondary | ICD-10-CM | POA: Diagnosis not present

## 2021-03-31 DIAGNOSIS — N2581 Secondary hyperparathyroidism of renal origin: Secondary | ICD-10-CM | POA: Diagnosis present

## 2021-03-31 DIAGNOSIS — Z043 Encounter for examination and observation following other accident: Secondary | ICD-10-CM | POA: Diagnosis not present

## 2021-03-31 DIAGNOSIS — W19XXXD Unspecified fall, subsequent encounter: Secondary | ICD-10-CM

## 2021-03-31 DIAGNOSIS — Z66 Do not resuscitate: Secondary | ICD-10-CM | POA: Diagnosis not present

## 2021-03-31 DIAGNOSIS — E875 Hyperkalemia: Secondary | ICD-10-CM | POA: Diagnosis present

## 2021-03-31 DIAGNOSIS — R972 Elevated prostate specific antigen [PSA]: Secondary | ICD-10-CM

## 2021-03-31 DIAGNOSIS — Z4682 Encounter for fitting and adjustment of non-vascular catheter: Secondary | ICD-10-CM | POA: Diagnosis not present

## 2021-03-31 DIAGNOSIS — Z8701 Personal history of pneumonia (recurrent): Secondary | ICD-10-CM | POA: Diagnosis not present

## 2021-03-31 DIAGNOSIS — Z7982 Long term (current) use of aspirin: Secondary | ICD-10-CM

## 2021-03-31 DIAGNOSIS — E860 Dehydration: Secondary | ICD-10-CM | POA: Diagnosis present

## 2021-03-31 DIAGNOSIS — Z8249 Family history of ischemic heart disease and other diseases of the circulatory system: Secondary | ICD-10-CM

## 2021-03-31 DIAGNOSIS — R627 Adult failure to thrive: Secondary | ICD-10-CM | POA: Diagnosis present

## 2021-03-31 DIAGNOSIS — G9341 Metabolic encephalopathy: Secondary | ICD-10-CM

## 2021-03-31 DIAGNOSIS — I6503 Occlusion and stenosis of bilateral vertebral arteries: Secondary | ICD-10-CM | POA: Diagnosis not present

## 2021-03-31 DIAGNOSIS — E7800 Pure hypercholesterolemia, unspecified: Secondary | ICD-10-CM | POA: Diagnosis present

## 2021-03-31 DIAGNOSIS — Z20822 Contact with and (suspected) exposure to covid-19: Secondary | ICD-10-CM | POA: Diagnosis present

## 2021-03-31 DIAGNOSIS — M19012 Primary osteoarthritis, left shoulder: Secondary | ICD-10-CM | POA: Diagnosis present

## 2021-03-31 DIAGNOSIS — J9621 Acute and chronic respiratory failure with hypoxia: Secondary | ICD-10-CM | POA: Diagnosis present

## 2021-03-31 DIAGNOSIS — Z978 Presence of other specified devices: Secondary | ICD-10-CM

## 2021-03-31 DIAGNOSIS — Z83438 Family history of other disorder of lipoprotein metabolism and other lipidemia: Secondary | ICD-10-CM

## 2021-03-31 DIAGNOSIS — J9602 Acute respiratory failure with hypercapnia: Secondary | ICD-10-CM | POA: Diagnosis not present

## 2021-03-31 DIAGNOSIS — N184 Chronic kidney disease, stage 4 (severe): Secondary | ICD-10-CM | POA: Diagnosis not present

## 2021-03-31 DIAGNOSIS — R579 Shock, unspecified: Secondary | ICD-10-CM

## 2021-03-31 DIAGNOSIS — I10 Essential (primary) hypertension: Secondary | ICD-10-CM | POA: Diagnosis not present

## 2021-03-31 DIAGNOSIS — E876 Hypokalemia: Secondary | ICD-10-CM | POA: Diagnosis not present

## 2021-03-31 DIAGNOSIS — E1122 Type 2 diabetes mellitus with diabetic chronic kidney disease: Secondary | ICD-10-CM | POA: Diagnosis present

## 2021-03-31 DIAGNOSIS — E1151 Type 2 diabetes mellitus with diabetic peripheral angiopathy without gangrene: Secondary | ICD-10-CM | POA: Diagnosis present

## 2021-03-31 DIAGNOSIS — R64 Cachexia: Secondary | ICD-10-CM | POA: Diagnosis present

## 2021-03-31 DIAGNOSIS — J9600 Acute respiratory failure, unspecified whether with hypoxia or hypercapnia: Secondary | ICD-10-CM | POA: Diagnosis not present

## 2021-03-31 DIAGNOSIS — S0990XA Unspecified injury of head, initial encounter: Secondary | ICD-10-CM | POA: Diagnosis not present

## 2021-03-31 DIAGNOSIS — Z79899 Other long term (current) drug therapy: Secondary | ICD-10-CM

## 2021-03-31 DIAGNOSIS — I129 Hypertensive chronic kidney disease with stage 1 through stage 4 chronic kidney disease, or unspecified chronic kidney disease: Secondary | ICD-10-CM | POA: Diagnosis present

## 2021-03-31 DIAGNOSIS — R918 Other nonspecific abnormal finding of lung field: Secondary | ICD-10-CM | POA: Diagnosis not present

## 2021-03-31 DIAGNOSIS — J432 Centrilobular emphysema: Secondary | ICD-10-CM | POA: Diagnosis not present

## 2021-03-31 DIAGNOSIS — I255 Ischemic cardiomyopathy: Secondary | ICD-10-CM | POA: Diagnosis not present

## 2021-03-31 DIAGNOSIS — Z7951 Long term (current) use of inhaled steroids: Secondary | ICD-10-CM

## 2021-03-31 DIAGNOSIS — I700 Atherosclerosis of aorta: Secondary | ICD-10-CM | POA: Diagnosis present

## 2021-03-31 DIAGNOSIS — Z87891 Personal history of nicotine dependence: Secondary | ICD-10-CM

## 2021-03-31 DIAGNOSIS — J9601 Acute respiratory failure with hypoxia: Secondary | ICD-10-CM | POA: Diagnosis not present

## 2021-03-31 DIAGNOSIS — I469 Cardiac arrest, cause unspecified: Secondary | ICD-10-CM

## 2021-03-31 DIAGNOSIS — R0603 Acute respiratory distress: Secondary | ICD-10-CM | POA: Diagnosis not present

## 2021-03-31 DIAGNOSIS — Z974 Presence of external hearing-aid: Secondary | ICD-10-CM

## 2021-03-31 DIAGNOSIS — Z6820 Body mass index (BMI) 20.0-20.9, adult: Secondary | ICD-10-CM

## 2021-03-31 DIAGNOSIS — Z4659 Encounter for fitting and adjustment of other gastrointestinal appliance and device: Secondary | ICD-10-CM | POA: Diagnosis not present

## 2021-03-31 DIAGNOSIS — Z7902 Long term (current) use of antithrombotics/antiplatelets: Secondary | ICD-10-CM

## 2021-03-31 DIAGNOSIS — D709 Neutropenia, unspecified: Secondary | ICD-10-CM | POA: Diagnosis not present

## 2021-03-31 DIAGNOSIS — R109 Unspecified abdominal pain: Secondary | ICD-10-CM

## 2021-03-31 DIAGNOSIS — H9190 Unspecified hearing loss, unspecified ear: Secondary | ICD-10-CM | POA: Diagnosis present

## 2021-03-31 DIAGNOSIS — Z955 Presence of coronary angioplasty implant and graft: Secondary | ICD-10-CM

## 2021-03-31 DIAGNOSIS — R0902 Hypoxemia: Secondary | ICD-10-CM | POA: Diagnosis not present

## 2021-03-31 DIAGNOSIS — Z7984 Long term (current) use of oral hypoglycemic drugs: Secondary | ICD-10-CM

## 2021-03-31 DIAGNOSIS — G319 Degenerative disease of nervous system, unspecified: Secondary | ICD-10-CM | POA: Diagnosis not present

## 2021-03-31 DIAGNOSIS — J9811 Atelectasis: Secondary | ICD-10-CM | POA: Diagnosis present

## 2021-03-31 DIAGNOSIS — E43 Unspecified severe protein-calorie malnutrition: Secondary | ICD-10-CM | POA: Insufficient documentation

## 2021-03-31 DIAGNOSIS — R54 Age-related physical debility: Secondary | ICD-10-CM | POA: Diagnosis present

## 2021-03-31 DIAGNOSIS — R570 Cardiogenic shock: Secondary | ICD-10-CM | POA: Diagnosis not present

## 2021-03-31 DIAGNOSIS — N170 Acute kidney failure with tubular necrosis: Secondary | ICD-10-CM | POA: Diagnosis present

## 2021-03-31 DIAGNOSIS — J9622 Acute and chronic respiratory failure with hypercapnia: Secondary | ICD-10-CM | POA: Diagnosis present

## 2021-03-31 DIAGNOSIS — Z7189 Other specified counseling: Secondary | ICD-10-CM | POA: Diagnosis not present

## 2021-03-31 DIAGNOSIS — J323 Chronic sphenoidal sinusitis: Secondary | ICD-10-CM | POA: Diagnosis not present

## 2021-03-31 LAB — CBC
HCT: 37.5 % — ABNORMAL LOW (ref 39.0–52.0)
HCT: 39.9 % (ref 39.0–52.0)
Hemoglobin: 11.3 g/dL — ABNORMAL LOW (ref 13.0–17.0)
Hemoglobin: 11.7 g/dL — ABNORMAL LOW (ref 13.0–17.0)
MCH: 30.3 pg (ref 26.0–34.0)
MCH: 30.1 pg (ref 26.0–34.0)
MCHC: 30.1 g/dL (ref 30.0–36.0)
MCHC: 29.3 g/dL — ABNORMAL LOW (ref 30.0–36.0)
MCV: 100.5 fL — ABNORMAL HIGH (ref 80.0–100.0)
MCV: 102.6 fL — ABNORMAL HIGH (ref 80.0–100.0)
Platelets: 207 10*3/uL (ref 150–400)
Platelets: 199 10*3/uL (ref 150–400)
RBC: 3.73 MIL/uL — ABNORMAL LOW (ref 4.22–5.81)
RBC: 3.89 MIL/uL — ABNORMAL LOW (ref 4.22–5.81)
RDW: 14.8 % (ref 11.5–15.5)
RDW: 14.6 % (ref 11.5–15.5)
WBC: 3.8 10*3/uL — ABNORMAL LOW (ref 4.0–10.5)
WBC: 4.2 10*3/uL (ref 4.0–10.5)
nRBC: 0 % (ref 0.0–0.2)
nRBC: 0 % (ref 0.0–0.2)

## 2021-03-31 LAB — BASIC METABOLIC PANEL
Anion gap: 7 (ref 5–15)
BUN: 30 mg/dL — ABNORMAL HIGH (ref 8–23)
CO2: 33 mmol/L — ABNORMAL HIGH (ref 22–32)
Calcium: 9.2 mg/dL (ref 8.9–10.3)
Chloride: 97 mmol/L — ABNORMAL LOW (ref 98–111)
Creatinine, Ser: 1.59 mg/dL — ABNORMAL HIGH (ref 0.61–1.24)
GFR, Estimated: 40 mL/min — ABNORMAL LOW (ref >60)
Glucose, Bld: 139 mg/dL — ABNORMAL HIGH (ref 70–99)
Potassium: 5.3 mmol/L — ABNORMAL HIGH (ref 3.5–5.1)
Sodium: 137 mmol/L (ref 135–145)

## 2021-03-31 LAB — MAGNESIUM: Magnesium: 2.3 mg/dL (ref 1.7–2.4)

## 2021-03-31 LAB — RESP PANEL BY RT-PCR (FLU A&B, COVID) ARPGX2
Influenza A by PCR: NEGATIVE
Influenza B by PCR: NEGATIVE
SARS Coronavirus 2 by RT PCR: NEGATIVE

## 2021-03-31 LAB — HEPATIC FUNCTION PANEL
ALT: 11 U/L (ref 0–44)
AST: 17 U/L (ref 15–41)
Albumin: 3.6 g/dL (ref 3.5–5.0)
Alkaline Phosphatase: 49 U/L (ref 38–126)
Bilirubin, Direct: <0.1 mg/dL (ref 0.0–0.2)
Total Bilirubin: 0.7 mg/dL (ref 0.3–1.2)
Total Protein: 7.3 g/dL (ref 6.5–8.1)

## 2021-03-31 LAB — LACTIC ACID, PLASMA
Lactic Acid, Venous: 0.8 mmol/L (ref 0.5–1.9)
Lactic Acid, Venous: 2.2 mmol/L (ref 0.5–1.9)

## 2021-03-31 LAB — GLUCOSE, CAPILLARY
Glucose-Capillary: 139 mg/dL — ABNORMAL HIGH (ref 70–99)
Glucose-Capillary: 138 mg/dL — ABNORMAL HIGH (ref 70–99)

## 2021-03-31 LAB — TROPONIN I (HIGH SENSITIVITY): Troponin I (High Sensitivity): 74 ng/L — ABNORMAL HIGH (ref <18)

## 2021-03-31 LAB — CREATININE, SERUM
Creatinine, Ser: 1.52 mg/dL — ABNORMAL HIGH (ref 0.61–1.24)
GFR, Estimated: 43 mL/min — ABNORMAL LOW (ref >60)

## 2021-03-31 LAB — TSH: TSH: 3.001 u[IU]/mL (ref 0.350–4.500)

## 2021-03-31 MED ORDER — SODIUM CHLORIDE 0.9% FLUSH
3.0000 mL | Freq: Two times a day (BID) | INTRAVENOUS | Status: DC
  Administered 2021-03-31 – 2021-04-08 (×15): 3 mL via INTRAVENOUS

## 2021-03-31 MED ORDER — IPRATROPIUM-ALBUTEROL 0.5-2.5 (3) MG/3ML IN SOLN
3.0000 mL | Freq: Once | RESPIRATORY_TRACT | Status: AC
  Administered 2021-03-31: 3 mL via RESPIRATORY_TRACT
  Filled 2021-03-31: qty 3

## 2021-03-31 MED ORDER — METHYLPREDNISOLONE SODIUM SUCC 125 MG IJ SOLR
125.0000 mg | Freq: Once | INTRAMUSCULAR | Status: AC
  Administered 2021-03-31: 125 mg via INTRAVENOUS
  Filled 2021-03-31: qty 2

## 2021-03-31 MED ORDER — ACETAMINOPHEN 650 MG RE SUPP: 650.0000 mg | Freq: Four times a day (QID) | RECTAL | Status: DC | PRN

## 2021-03-31 MED ORDER — ASPIRIN EC 81 MG PO TBEC
81.0000 mg | DELAYED_RELEASE_TABLET | Freq: Every day | ORAL | Status: DC
  Administered 2021-04-01: 81 mg via ORAL
  Filled 2021-03-31 (×3): qty 1

## 2021-03-31 MED ORDER — FUROSEMIDE 20 MG PO TABS
20.0000 mg | ORAL_TABLET | Freq: Every day | ORAL | Status: DC
  Administered 2021-04-01: 20 mg via ORAL
  Filled 2021-03-31: qty 1

## 2021-03-31 MED ORDER — VITAMIN B-12 1000 MCG PO TABS
1000.0000 ug | ORAL_TABLET | Freq: Every day | ORAL | Status: DC
  Administered 2021-04-01: 1000 ug via ORAL
  Filled 2021-03-31 (×2): qty 1

## 2021-03-31 MED ORDER — ENSURE ENLIVE PO LIQD
237.0000 mL | Freq: Two times a day (BID) | ORAL | Status: DC
  Administered 2021-03-31 – 2021-04-01 (×2): 237 mL via ORAL

## 2021-03-31 MED ORDER — SODIUM CHLORIDE 0.9 % IV SOLN
250.0000 mL | INTRAVENOUS | Status: DC | PRN
Start: 2021-03-31 — End: 2021-04-09
  Administered 2021-04-05: 250 mL via INTRAVENOUS

## 2021-03-31 MED ORDER — SODIUM ZIRCONIUM CYCLOSILICATE 10 G PO PACK
10.0000 g | PACK | Freq: Every day | ORAL | Status: DC
  Administered 2021-03-31: 10 g via ORAL
  Filled 2021-03-31 (×2): qty 1

## 2021-03-31 MED ORDER — DOCUSATE SODIUM 100 MG PO CAPS
100.0000 mg | ORAL_CAPSULE | Freq: Every day | ORAL | Status: DC
  Administered 2021-04-01: 100 mg via ORAL
  Filled 2021-03-31 (×2): qty 1

## 2021-03-31 MED ORDER — SODIUM CHLORIDE 0.9 % IV BOLUS
500.0000 mL | Freq: Once | INTRAVENOUS | Status: AC
  Administered 2021-03-31: 500 mL via INTRAVENOUS

## 2021-03-31 MED ORDER — ACETAMINOPHEN 325 MG PO TABS
650.0000 mg | ORAL_TABLET | Freq: Four times a day (QID) | ORAL | Status: DC | PRN
  Administered 2021-04-04: 650 mg via ORAL
  Filled 2021-03-31: qty 2

## 2021-03-31 MED ORDER — GUAIFENESIN 100 MG/5ML PO SOLN
400.0000 mg | ORAL | Status: DC
  Filled 2021-03-31: qty 20

## 2021-03-31 MED ORDER — TIMOLOL MALEATE 0.5 % OP SOLN
1.0000 [drp] | Freq: Two times a day (BID) | OPHTHALMIC | Status: DC
  Administered 2021-03-31 – 2021-04-08 (×13): 1 [drp] via OPHTHALMIC
  Filled 2021-03-31 (×2): qty 5

## 2021-03-31 MED ORDER — AMLODIPINE BESYLATE 5 MG PO TABS
5.0000 mg | ORAL_TABLET | Freq: Every day | ORAL | Status: DC
  Administered 2021-04-01: 5 mg via ORAL
  Filled 2021-03-31 (×2): qty 1

## 2021-03-31 MED ORDER — ENOXAPARIN SODIUM 30 MG/0.3ML IJ SOSY
30.0000 mg | PREFILLED_SYRINGE | INTRAMUSCULAR | Status: DC
  Administered 2021-03-31 – 2021-04-01 (×2): 30 mg via SUBCUTANEOUS
  Filled 2021-03-31 (×2): qty 0.3

## 2021-03-31 MED ORDER — ONDANSETRON HCL 4 MG/2ML IJ SOLN: 4.0000 mg | Freq: Four times a day (QID) | INTRAMUSCULAR | Status: DC | PRN

## 2021-03-31 MED ORDER — VITAMIN D 25 MCG (1000 UNIT) PO TABS: 1000.0000 [IU] | ORAL_TABLET | ORAL | Status: DC

## 2021-03-31 MED ORDER — LATANOPROST 0.005 % OP SOLN
1.0000 [drp] | Freq: Every day | OPHTHALMIC | Status: DC
  Administered 2021-04-01 – 2021-04-08 (×7): 1 [drp] via OPHTHALMIC
  Filled 2021-03-31 (×3): qty 2.5

## 2021-03-31 MED ORDER — GUAIFENESIN 100 MG/5ML PO SOLN
10.0000 mL | Freq: Four times a day (QID) | ORAL | Status: DC | PRN
  Filled 2021-03-31: qty 10

## 2021-03-31 MED ORDER — ONDANSETRON HCL 4 MG PO TABS: 4.0000 mg | ORAL_TABLET | Freq: Four times a day (QID) | ORAL | Status: DC | PRN

## 2021-03-31 MED ORDER — IPRATROPIUM-ALBUTEROL 0.5-2.5 (3) MG/3ML IN SOLN
3.0000 mL | Freq: Four times a day (QID) | RESPIRATORY_TRACT | Status: DC | PRN
  Administered 2021-04-04: 3 mL via RESPIRATORY_TRACT
  Filled 2021-03-31: qty 3

## 2021-03-31 MED ORDER — LOSARTAN POTASSIUM 50 MG PO TABS: 50.0000 mg | ORAL_TABLET | Freq: Every day | ORAL | Status: DC

## 2021-03-31 MED ORDER — MOMETASONE FURO-FORMOTEROL FUM 200-5 MCG/ACT IN AERO
2.0000 | INHALATION_SPRAY | Freq: Two times a day (BID) | RESPIRATORY_TRACT | Status: DC
  Administered 2021-03-31 – 2021-04-01 (×2): 2 via RESPIRATORY_TRACT
  Filled 2021-03-31 (×2): qty 8.8

## 2021-03-31 MED ORDER — SODIUM CHLORIDE 0.9% FLUSH
3.0000 mL | INTRAVENOUS | Status: DC | PRN
  Administered 2021-04-06: 3 mL via INTRAVENOUS

## 2021-03-31 MED ORDER — CLOPIDOGREL BISULFATE 75 MG PO TABS
75.0000 mg | ORAL_TABLET | Freq: Every day | ORAL | Status: DC
  Administered 2021-04-01: 75 mg via ORAL
  Filled 2021-03-31 (×2): qty 1

## 2021-03-31 NOTE — Telephone Encounter (Signed)
FYI

## 2021-03-31 NOTE — ED Notes (Signed)
Lab called for blood collection.

## 2021-03-31 NOTE — H&P (Signed)
History and Physical    Samuel Bryan. VV:[id number] DOB: 08-26-1927 DOA: 04/18/2021  PCP: Delsa Grana, PA-C   Patient coming from: Home  I have personally briefly reviewed patient's old medical records in [place]  Chief Complaint: Weakness   Patient is hard of hearing and most of the history was obtained from daughter at the bedside.  HPI: Samuel Revill. is a 87 y.o. male with medical history significant for hypertension, diabetes mellitus with stage III chronic kidney disease, coronary artery disease, ex-smoker who was sent to the emergency room from his primary care provider's office for evaluation of hypoxia. His daughter states that he fell 3 days prior to his admission.  He was trying to get into a chair when he fell on his knees.  He complains of having intermittent episodes of dizziness and lightheadedness and was assisted up without any difficulty. She notes that he has had a cough productive of clear phlegm, has become increasingly weaker and has had poor oral intake.  She also notes that he is more short of breath compared to his baseline. She took him to see his primary care provider where he was found to be hypoxic with room air pulse oximetry of 88%.  They were advised to go to the ER for further evaluation. He denies having any chest pain, unable to tell me if he has had fever but states that he feels very hot, no abdominal pain, no nausea, no urinary symptoms, no changes in his bowel habits, no diaphoresis, no palpitations, no headache, no focal deficits. Venous blood gas 7.31/83/12/41.8/48 Sodium 137, potassium 5.3, chloride 97, bicarb 33, glucose 139, BUN 30, creatinine 1.59, calcium 9.2, alkaline phosphatase 49, albumin 3.6, AST 17, ALT 11, troponin 74, lactic acid 0.8, white count 3.8, hemoglobin 9.3, hematocrit 37.5, MCV 100.5, RDW 15.8, platelet count 207 Respiratory viral panel is negative Chest x-ray reviewed by me shows  elevated right hemidiaphragm  with right lower lobe atelectasis.Remaining lungs clear. CT scan of the head without contrast shows no acute abnormality.  Mild atrophy and mild white matter ischemia. CT scan of the chest without contrast shows unchanged scarring of the right lung base with elevation of the right hemidiaphragm and pleural calcifications in the right lung base. Findings are consistent with sequelae of prior infection or inflammation.No acute airspace opacity. Minimal emphysema. Coronary artery disease. Cholelithiasis. Twelve-lead EKG reviewed by me shows sinus rhythm with sinus arrhythmia.  ED Course:.  Patient is a 87 year old male who was referred to the emergency room from his primary care provider's office for evaluation of hypoxia.  He was found to have room air pulse oximetry of 88% and is currently on 2 L of oxygen to maintain pulse oximetry greater than 92%. He does not have a known history of COPD. Will be referred to observation status for further evaluation.  Review of Systems: As per HPI otherwise all other systems reviewed and negative.    Past Medical History:  Diagnosis Date  . Anemia   . Atherosclerosis of renal artery ([place])   . Essential hypertension, benign   . Occlusion and stenosis of carotid artery without mention of cerebral infarction    moderate left ICA stenosis  . Peripheral vascular disease, unspecified ([place])    mild lifestyle limiting claudication  . Pure hypercholesterolemia   . Renal insufficiency   . Type 2 diabetes mellitus ([place])     Past Surgical History:  Procedure Laterality Date  . APPENDECTOMY  1970's  . COLONOSCOPY    .  coronary artery stenting  1999   at [facility]   . ENDARTERECTOMY Left 12/18/2017   Procedure: ENDARTERECTOMY CAROTID WITH PATCH;  Surgeon: Delana Meyer Dolores Lory, MD;  Location: [facility] ORS;  Service: Vascular;  Laterality: Left;  . FACIAL LACERATION REPAIR Left 12/13/2015   performed at [facility] by Dr. Marcial Pacas  . renal angiography    . VISCERAL  ANGIOGRAPHY N/A 08/19/2018   Procedure: VISCERAL ANGIOGRAPHY;  Surgeon: Katha Cabal, MD;  Location: [facility];  Service: Cardiovascular;  Laterality: N/A;     reports that he quit smoking about 50 years ago. His smoking use included cigarettes. He has never used smokeless tobacco. He reports that he does not drink alcohol and does not use drugs.  No Known Allergies  Family History  Problem Relation Age of Onset  . Diabetes Mother   . Heart disease Mother   . Hypertension Brother   . Hyperlipidemia Brother   . Diabetes Brother   . Hypertension Son   . Hypertension Son   . Birth defects Neg Hx   . Stroke Neg Hx       Prior to Admission medications   Medication Sig Start Date End Date Taking? Authorizing Provider  aspirin EC 81 MG tablet Take 81 mg by mouth daily.    Yes [provider]  cholecalciferol (VITAMIN D3) 25 MCG (1000 UNIT) tablet Take 1,000 Units by mouth 2 (two) times a week.   Yes [provider]  clopidogrel (PLAVIX) 75 MG tablet TAKE 1 TABLET DAILY. Patient taking differently: Take 75 mg by mouth daily. 09/07/19  Yes Schnier, Dolores Lory, MD  docusate sodium (COLACE) 100 MG capsule Take 100 mg by mouth daily.   Yes [provider]  furosemide (LASIX) 40 MG tablet Take 20 mg by mouth daily. 07/10/16  Yes [provider]  guaifenesin (HUMIBID E) 400 MG TABS tablet Take 400 mg by mouth every other day.   Yes [provider]  latanoprost (XALATAN) 0.005 % ophthalmic solution Place 1 drop into both eyes at bedtime.   Yes [provider]  linagliptin (TRADJENTA) 5 MG TABS tablet Take 1 tablet (5 mg total) by mouth daily. For diabetes 03/18/20  Yes Delsa Grana, PA-C  losartan (COZAAR) 100 MG tablet Take 50 mg by mouth daily. 07/10/16  Yes [provider]  Nutritional Supplements (ENSURE ACTIVE HIGH PROTEIN) LIQD Take 1 Can by mouth in the morning and at bedtime. 04/21/20  Yes Delsa Grana, PA-C   timolol (BETIMOL) 0.5 % ophthalmic solution Place 1 drop into both eyes 2 (two) times daily. 11/11/15  Yes Ashok Norris, MD  vitamin B-12 (CYANOCOBALAMIN) 1000 MCG tablet Take 1 tablet (1,000 mcg total) by mouth daily. Patient taking differently: Take 5,000 mcg by mouth daily. 12/19/20  Yes Delsa Grana, PA-C    Physical Exam: Vitals:   04/13/2021 1045 04/15/2021 1230 04/08/2021 1300 04/18/2021 1530  BP:   (!) 176/58 (!) 140/126  Pulse:  74 69 76  Resp:  '20 11 17  '$ Temp:      TempSrc:      SpO2:  100% 100% 100%  Weight: 59 kg     Height: '5\' 6"'$  (1.676 m)        Vitals:   03/30/2021 1045 03/29/2021 1230 04/11/2021 1300 04/06/2021 1530  BP:   (!) 176/58 (!) 140/126  Pulse:  74 69 76  Resp:  '20 11 17  '$ Temp:      TempSrc:      SpO2:  100% 100% 100%  Weight: 59 kg     Height: '5\' 6"'$  (1.676 m)         Constitutional: Alert and oriented x 2, to person and place. Not in any apparent distress.  Hard of hearing. HEENT:      Head: Normocephalic and atraumatic.         Eyes: PERLA, EOMI, Conjunctivae are normal. Sclera is non-icteric.       Mouth/Throat: Mucous membranes are moist.       Neck: Supple with no signs of meningismus. Cardiovascular: Regular rate and rhythm.. No murmurs, gallops, or rubs. 2+ symmetrical distal pulses are present . No JVD. No.  LE edema Respiratory: Respiratory effort normal .Lungs sounds clear bilaterally. No wheezes, crackles, or rhonchi.  Gastrointestinal: Soft, non tender, and non distended with positive bowel sounds.  Genitourinary: No CVA tenderness. Musculoskeletal: Nontender with normal range of motion in all extremities psych exam. No cyanosis, or erythema of extremities. Neurologic:  Face is symmetric. Moving all extremities. No gross focal neurologic deficits . Skin: Skin is warm, dry.  No rash or ulcers Psychiatric: Mood and affect are normal   Labs on Admission: I have personally reviewed following labs and imaging studies  CBC: Recent Labs  Lab  03/29/2021 1051  WBC 3.8*  HGB 11.3*  HCT 37.5*  MCV 100.5*  PLT [id number]   Basic Metabolic Panel: Recent Labs  Lab 04/16/2021 1051  NA 137  K 5.3*  CL 97*  CO2 33*  GLUCOSE 139*  BUN 30*  CREATININE 1.59*  CALCIUM 9.2   GFR: Estimated Creatinine Clearance: 24.7 mL/min (A) (by C-G formula based on SCr of 1.59 mg/dL (H)). Liver Function Tests: Recent Labs  Lab 04/27/2021 1051  AST 17  ALT 11  ALKPHOS 49  BILITOT 0.7  PROT 7.3  ALBUMIN 3.6   No results for input(s): LIPASE, AMYLASE in the last 168 hours. No results for input(s): AMMONIA in the last 168 hours. Coagulation Profile: No results for input(s): INR, PROTIME in the last 168 hours. Cardiac Enzymes: No results for input(s): CKTOTAL, CKMB, CKMBINDEX, TROPONINI in the last 168 hours. BNP (last 3 results) No results for input(s): PROBNP in the last 8760 hours. HbA1C: No results for input(s): HGBA1C in the last 72 hours. CBG: No results for input(s): GLUCAP in the last 168 hours. Lipid Profile: No results for input(s): CHOL, HDL, LDLCALC, TRIG, CHOLHDL, LDLDIRECT in the last 72 hours. Thyroid Function Tests: No results for input(s): TSH, T4TOTAL, FREET4, T3FREE, THYROIDAB in the last 72 hours. Anemia Panel: No results for input(s): VITAMINB12, FOLATE, FERRITIN, TIBC, IRON, RETICCTPCT in the last 72 hours. Urine analysis:    Component Value Date/Time   COLORURINE YELLOW (A) 01/12/2020 0802   APPEARANCEUR HAZY (A) 01/12/2020 0802   LABSPEC 1.019 01/12/2020 0802   PHURINE 5.0 01/12/2020 0802   GLUCOSEU NEGATIVE 01/12/2020 0802   HGBUR SMALL (A) 01/12/2020 0802   BILIRUBINUR NEGATIVE 01/12/2020 0802   KETONESUR NEGATIVE 01/12/2020 0802   PROTEINUR >=300 (A) 01/12/2020 0802   NITRITE NEGATIVE 01/12/2020 0802   LEUKOCYTESUR NEGATIVE 01/12/2020 0802    Radiological Exams on Admission: DG Chest 2 View  Result Date: 04/01/2021 CLINICAL DATA:  Short of breath EXAM: CHEST - 2 VIEW COMPARISON:  09/23/2017 FINDINGS:  Elevated right hemidiaphragm with colonic gas below the right hemidiaphragm. This has improved from the prior study. There is mild right lower lobe airspace disease. Likely atelectasis. Heart size is normal. Coronary artery calcification and aortic arch calcification. Negative  for heart failure. Left lung is clear. No effusion. IMPRESSION: Elevated right hemidiaphragm with right lower lobe atelectasis. Remaining lungs clear. Electronically Signed   By: Franchot Gallo M.D.   On: 04/21/2021 11:36   CT Head Wo Contrast  Result Date: 04/23/2021 CLINICAL DATA:  Fall, head trauma. EXAM: CT HEAD WITHOUT CONTRAST TECHNIQUE: Contiguous axial images were obtained from the base of the skull through the vertex without intravenous contrast. COMPARISON:  None. FINDINGS: Brain: Mild cerebral atrophy. Mild white matter hypodensity bilaterally. Negative for acute infarct, hemorrhage, mass. Vascular: Negative for hyperdense vessel Skull: Negative Sinuses/Orbits: Mucosal edema and bony thickening left sphenoid sinus. Remaining sinuses clear. No orbital lesion. Other: None IMPRESSION: No acute abnormality.  Mild atrophy and mild white matter ischemia. Electronically Signed   By: Franchot Gallo M.D.   On: 04/24/2021 11:55   CT CHEST WO CONTRAST  Result Date: 04/21/2021 CLINICAL DATA:  Pneumonia, hypoxia EXAM: CT CHEST WITHOUT CONTRAST TECHNIQUE: Multidetector CT imaging of the chest was performed following the standard protocol without IV contrast. COMPARISON:  CT abdomen pelvis, 07/25/2018 FINDINGS: Cardiovascular: Aortic atherosclerosis. Normal heart size. Extensive 3 vessel coronary artery calcifications and/or stents. No pericardial effusion. Mediastinum/Nodes: No enlarged mediastinal, hilar, or axillary lymph nodes. Thyroid gland, trachea, and esophagus demonstrate no significant findings. Lungs/Pleura: Unchanged scarring of the right lung base with elevation of the right hemidiaphragm and pleural calcifications in the right  lung base. Minimal centrilobular emphysema. No pleural effusion or pneumothorax. Upper Abdomen: No acute abnormality.  Gallstones in the gallbladder. Musculoskeletal: No chest wall mass or suspicious bone lesions identified. IMPRESSION: 1. Unchanged scarring of the right lung base with elevation of the right hemidiaphragm and pleural calcifications in the right lung base. Findings are consistent with sequelae of prior infection or inflammation. 2. No acute airspace opacity. 3. Minimal emphysema. 4. Coronary artery disease. 5. Cholelithiasis. Aortic Atherosclerosis (ICD10-I70.0) and Emphysema (ICD10-J43.9). Electronically Signed   By: Eddie Candle M.D.   On: 04/06/2021 14:49     Assessment/Plan Principal Problem:   Acute respiratory failure (HCC) Active Problems:   Essential hypertension   CKD stage 4 due to type 2 diabetes mellitus (HCC)   Hyperkalemia   Fall    Acute respiratory failure Unclear etiology Patient was referred to the ER for evaluation of hypoxia and had room air pulse oximetry of 88% Acute respiratory failure may be secondary to undiagnosed COPD Patient has a remote history of nicotine and CT chest shows emphysema Place patient on as needed bronchodilator therapy Start inhaled steroids We will consult pulmonology Patient will need to be assessed for home oxygen need prior to discharge     Hyperkalemia Most likely related to ARB use Hold losartan Place patient on Lokelma    Diabetes mellitus with complications of stage IV chronic kidney disease Maintain consistent carbohydrate diet Blood sugar checks before meals and at bedtime Renal function appears stable     Hypertension Blood pressure is uncontrolled Start patient on low-dose amlodipine    Status post fall Place patient on fall precautions PT evaluation   DVT prophylaxis: Lovenox Code Status: full code Family Communication: Greater than 50% of time was spent discussing patient's condition and  plan of care with his daughter at the bedside.  All questions and concerns have been addressed.  He verbalized understanding and agree with the plan. Disposition Plan: Back to previous home environment Consults called: Pulmonology/physical therapy Status: Observation    Halima Fogal MD [facility]     04/02/2021, 4:12 PM

## 2021-03-31 NOTE — ED Triage Notes (Addendum)
Pt fell 3 days ago and woke up this morning c/o of SOB and weakness. Pt c/o of right leg weakness. Pt has hx of stent placed in leg. Pt denies hitting head when he fell.

## 2021-03-31 NOTE — ED Provider Notes (Signed)
[facility] Emergency Department Provider Note  ____________________________________________   Event Date/Time   First MD Initiated Contact with Patient 04/24/2021 1111     (approximate)  I have reviewed the triage vital signs and the nursing notes.   HISTORY  Chief Complaint Fall and Shortness of Breath    HPI Samuel Bryan. is a 87 y.o. male presents emergency department after a fall 3 days ago.  States he woke up this morning and felt short of breath and weak.  States that he has felt feverish over the past couple of days.  He denies chest pain.  He denies abdominal pain.  No vomiting or diarrhea.  States he lives with his daughter.    Past Medical History:  Diagnosis Date  . Anemia   . Atherosclerosis of renal artery ([place])   . Essential hypertension, benign   . Occlusion and stenosis of carotid artery without mention of cerebral infarction    moderate left ICA stenosis  . Peripheral vascular disease, unspecified ([place])    mild lifestyle limiting claudication  . Pure hypercholesterolemia   . Renal insufficiency   . Type 2 diabetes mellitus [facility])     Patient Active Problem List   Diagnosis Date Noted  . Coagulation defect ([place]) 12/19/2020  . Paronychia of great toe of right foot 10/10/2020  . Neutropenia, unspecified type ([place]) 05/28/2020  . Protein-calorie malnutrition ([place]) 04/21/2020  . Benign hypertensive kidney disease with chronic kidney disease 01/06/2020  . Secondary hyperparathyroidism of renal origin ([place]) 07/13/2019  . Iron deficiency anemia 11/24/2018  . Aortic atherosclerosis ([place]) 07/28/2018  . Cholelithiasis 07/28/2018  . Pancreatic lesion 07/28/2018  . Chronic mesenteric ischemia ([place]) 07/28/2018  . Full code status 01/23/2018  . Memory loss 01/23/2018  . Carotid stenosis, asymptomatic, left 12/18/2017  . Degenerative arthritis of left shoulder region 08/19/2017  . Microalbuminuria 08/19/2017  . Type 2 diabetes mellitus  with stage 5 chronic kidney disease not on chronic dialysis, without long-term current use of insulin ([place]) 08/13/2017  . Preventative health care 12/11/2016  . Abdominal mass, right upper quadrant 04/03/2016  . Bilateral carotid bruits 04/03/2016  . Facial injury 10/26/2015  . 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 Arteriosclerosis of coronary artery 10/11/2015  . H/O angina pectoris 10/11/2015  . Hyperlipidemia 09/13/2015  . Chronic kidney disease, stage III (moderate) ([place]) 09/13/2015  . RENAL ATHEROSCLEROSIS 03/16/2009  . Essential hypertension 03/15/2009  . Coronary atherosclerosis 03/15/2009  . Carotid arterial disease ([place]) 03/15/2009  . Peripheral vascular disease ([place]) 03/15/2009  . Anemia in chronic kidney disease (CKD) 03/15/2009    Past Surgical History:  Procedure Laterality Date  . APPENDECTOMY  1970's  . COLONOSCOPY    . coronary artery stenting  1999   at [facility]   . ENDARTERECTOMY Left 12/18/2017   Procedure: ENDARTERECTOMY CAROTID WITH PATCH;  Surgeon: Delana Meyer Dolores Lory, MD;  Location: [facility] ORS;  Service: Vascular;  Laterality: Left;  . FACIAL LACERATION REPAIR Left 12/13/2015   performed at [facility] by Dr. Marcial Pacas  . renal angiography    . VISCERAL ANGIOGRAPHY N/A 08/19/2018   Procedure: VISCERAL ANGIOGRAPHY;  Surgeon: Katha Cabal, MD;  Location: [place] CV LAB;  Service: Cardiovascular;  Laterality: N/A;    Prior to Admission medications   Medication Sig Start Date End Date Taking? Authorizing Provider  amLODipine (NORVASC) 2.5 MG tablet  05/13/20   [provider]  aspirin EC 81 MG tablet Take 81 mg by mouth daily.  [provider]  atorvastatin (LIPITOR) 40 MG tablet Take 1 tablet (40 mg total) by mouth at bedtime. 09/18/19   Delsa Grana, PA-C  Cholecalciferol (VITAMIN D3) 1.25 MG (50000 UT) TABS Take 1 tablet by mouth daily.    [provider]  clopidogrel (PLAVIX) 75 MG tablet TAKE 1 TABLET DAILY. Patient taking  differently: Take 75 mg by mouth daily. 09/07/19   Schnier, Dolores Lory, MD  ferrous sulfate 325 (65 FE) MG EC tablet Take 325 mg by mouth daily.     [provider]  Fluticasone-Sodium Chloride ([place] TICANASE PAK) 50-2.7 MCG/ACT-% THPK Place into the nose.    [provider]  furosemide (LASIX) 40 MG tablet Take 20 mg by mouth daily. 07/10/16   [provider]  glucose blood (CONTOUR NEXT TEST) test strip Use as instructed 02/03/21   Delsa Grana, PA-C  guaifenesin (HUMIBID E) 400 MG TABS tablet Take 400 mg by mouth as directed.    [provider]  isosorbide mononitrate (IMDUR) 30 MG 24 hr tablet Take 1 tablet (30 mg total) by mouth daily. 08/19/17   Arnetha Courser, MD  Lancets MISC Check BS qd 12/19/20   Delsa Grana, PA-C  latanoprost (XALATAN) 0.005 % ophthalmic solution Place 1 drop into both eyes at bedtime.    [provider]  linagliptin (TRADJENTA) 5 MG TABS tablet Take 1 tablet (5 mg total) by mouth daily. For diabetes 03/18/20   Delsa Grana, PA-C  losartan (COZAAR) 100 MG tablet Take 50 mg by mouth daily. 07/10/16   [provider]  Multiple Vitamin (MULTIVITAMIN) tablet Take 1 tablet by mouth daily.     [provider]  Nutritional Supplements (ENSURE ACTIVE HIGH PROTEIN) LIQD Take 1 Can by mouth in the morning and at bedtime. 04/21/20   Delsa Grana, PA-C  polyethylene glycol (MIRALAX / GLYCOLAX) 17 g packet Take 17 g by mouth daily.    [provider]  timolol (BETIMOL) 0.5 % ophthalmic solution Place 1 drop into both eyes 2 (two) times daily. 11/11/15   Ashok Norris, MD  timolol (TIMOPTIC) 0.5 % ophthalmic solution  09/02/20   [provider]  vitamin B-12 (CYANOCOBALAMIN) 1000 MCG tablet Take 1 tablet (1,000 mcg total) by mouth daily. 12/19/20   Delsa Grana, PA-C    Allergies Patient has no known allergies.  Family History  Problem Relation Age of Onset  . Diabetes Mother   . Heart disease  Mother   . Hypertension Brother   . Hyperlipidemia Brother   . Diabetes Brother   . Hypertension Son   . Hypertension Son   . Birth defects Neg Hx   . Stroke Neg Hx     Social History Social History   Tobacco Use  . Smoking status: Former Smoker    Types: Cigarettes    Quit date: 10/29/1970    Years since quitting: 50.4  . Smokeless tobacco: Never Used  Vaping Use  . Vaping Use: Never used  Substance Use Topics  . Alcohol use: No    Alcohol/week: 0.0 standard drinks  . Drug use: No    Review of Systems  Constitutional: Positive fever/chills Eyes: No visual changes. ENT: No sore throat. Respiratory: Denies cough, complains of shortness of breath Cardiovascular: Denies chest pain Gastrointestinal: Denies abdominal pain Genitourinary: Negative for dysuria. Musculoskeletal: Negative for back pain. Skin: Negative for rash. Psychiatric: no mood changes,     ____________________________________________   PHYSICAL EXAM:  VITAL SIGNS: ED Triage Vitals  Enc  Vitals Group     BP 04/24/2021 1042 (!) 112/55     Pulse Rate 04/18/2021 1042 62     Resp 04/11/2021 1042 18     Temp 04/05/2021 1042 98.5 F (36.9 C)     Temp Source 04/02/2021 1042 Oral     SpO2 04/25/2021 1042 (!) 88 %     Weight 04/05/2021 1045 130 lb (59 kg)     Height 04/02/2021 1045 '5\' 6"'$  (1.676 m)     Head Circumference --      Peak Flow --      Pain Score 04/26/2021 1045 0     Pain Loc --      Pain Edu? --      Excl. in [place]? --     Constitutional: Alert and oriented. Well appearing and in no acute distress. Eyes: Conjunctivae are normal.  Head: Atraumatic. Nose: No congestion/rhinnorhea. Mouth/Throat: Mucous membranes are moist.   Neck:  supple no lymphadenopathy noted Cardiovascular: Normal rate, regular rhythm. Heart sounds are normal Respiratory: Normal respiratory effort.  No retractions, lungs c t a  Abd: soft nontender bs normal all 4 quad GU: deferred Musculoskeletal: FROM all extremities, warm and  well perfused Neurologic:  Normal speech and language.  Skin:  Skin is warm, dry and intact. No rash noted. Psychiatric: Mood and affect are normal. Speech and behavior are normal.  ____________________________________________   LABS (all labs ordered are listed, but only abnormal results are displayed)  Labs Reviewed  CBC - Abnormal; Notable for the following components:      Result Value   WBC 3.8 (*)    RBC 3.73 (*)    Hemoglobin 11.3 (*)    HCT 37.5 (*)    MCV 100.5 (*)    All other components within normal limits  BASIC METABOLIC PANEL - Abnormal; Notable for the following components:   Potassium 5.3 (*)    Chloride 97 (*)    CO2 33 (*)    Glucose, Bld 139 (*)    BUN 30 (*)    Creatinine, Ser 1.59 (*)    GFR, Estimated 40 (*)    All other components within normal limits  BLOOD GAS, VENOUS - Abnormal; Notable for the following components:   pCO2, Ven 83 (*)    Bicarbonate 41.8 (*)    Acid-Base Excess 12.1 (*)    All other components within normal limits  TROPONIN I (HIGH SENSITIVITY) - Abnormal; Notable for the following components:   Troponin I (High Sensitivity) 74 (*)    All other components within normal limits  RESP PANEL BY RT-PCR (FLU A&B, COVID) ARPGX2  LACTIC ACID, PLASMA  HEPATIC FUNCTION PANEL  LACTIC ACID, PLASMA   ____________________________________________   ____________________________________________  RADIOLOGY  Chest x-ray CT of the head  ____________________________________________   PROCEDURES  Procedure(s) performed: No  Procedures    ____________________________________________   INITIAL IMPRESSION / ASSESSMENT AND PLAN / ED COURSE  Pertinent labs & imaging results that were available during my care of the patient were reviewed by me and considered in my medical decision making (see chart for details).   Patient is a 87 year old male presents emergency department with weakness and shortness of breath.  See HPI.  Physical  exam shows patient per stable time, on arrival O2 was 88 on RA.  He is placed on 2 L nasal cannula  DDx: COVID, sepsis, CAP, MI, CHF  EKG shows sinus rhythm with arrhythmia  CBC has depressed WBC of 3.8, basic metabolic  panel has potassium 5.3, glucose elevated 139, BUN and creatinine are elevated but are in his normal trend per old labs.  Chest x-ray reviewed by me appears to be normal.  Awaiting radiology read  CT of the head   care transferred to dr Corky Downs  Samuel Bryan. was evaluated in Emergency Department on 04/20/2021 for the symptoms described in the history of present illness. He was evaluated in the context of the global COVID-19 pandemic, which necessitated consideration that the patient might be at risk for infection with the SARS-CoV-2 virus that causes COVID-19. Institutional protocols and algorithms that pertain to the evaluation of patients at risk for COVID-19 are in a state of rapid change based on information released by regulatory bodies including the CDC and federal and state organizations. These policies and algorithms were followed during the patient's care in the ED.    As part of my medical decision making, I reviewed the following data within the [place] History obtained from family, Nursing notes reviewed and incorporated, Labs reviewed , EKG interpreted see physician read, Old chart reviewed, Radiograph reviewed , Evaluated by EM attending Dr. Corky Downs, Notes from prior ED visits and [place] Controlled Substance Database  ____________________________________________   FINAL CLINICAL IMPRESSION(S) / ED DIAGNOSES  Final diagnoses:  Hypoxia      NEW MEDICATIONS STARTED DURING THIS VISIT:  New Prescriptions   No medications on file     Note:  This document was prepared using Dragon voice recognition software and may include unintentional dictation errors.    Versie Starks, PA-C 03/29/2021 1228    Lavonia Drafts, MD 04/11/2021 1245

## 2021-03-31 NOTE — Telephone Encounter (Signed)
Pt.'s daughter ,[name], reports pt. Fell at home Tuesday. Tripped and fell on his knees. Did not hit his head. Has been weak, tired and dizzy since fall. No availability in the practice. Family will take pt. To ED.  Reason for Disposition . [1] MODERATE weakness (i.e., interferes with work, school, normal activities) AND [2] new-onset or worsening  Answer Assessment - Initial Assessment Questions 1. MECHANISM: "How did the fall happen?"     Tripped and fell knees 2. DOMESTIC VIOLENCE AND ELDER ABUSE SCREENING: "Did you fall because someone pushed you or tried to hurt you?" If Yes, ask: "Are you safe now?"     No 3. ONSET: "When did the fall happen?" (e.g., minutes, hours, or days ago)     Tuesday 4. LOCATION: "What part of the body hit the ground?" (e.g., back, buttocks, head, hips, knees, hands, head, stomach)     Knees 5. INJURY: "Did you hurt (injure) yourself when you fell?" If Yes, ask: "What did you injure? Tell me more about this?" (e.g., body area; type of injury; pain severity)"     No 6. PAIN: "Is there any pain?" If Yes, ask: "How bad is the pain?" (e.g., Scale 1-10; or mild,  moderate, severe)   - NONE (0): No pain   - MILD (1-3): Doesn't interfere with normal activities    - MODERATE (4-7): Interferes with normal activities or awakens from sleep    - SEVERE (8-10): Excruciating pain, unable to do any normal activities      No 7. SIZE: For cuts, bruises, or swelling, ask: "How large is it?" (e.g., inches or centimeters)      No 8. PREGNANCY: "Is there any chance you are pregnant?" "When was your last menstrual period?"     n/a 9. OTHER SYMPTOMS: "Do you have any other symptoms?" (e.g., dizziness, fever, weakness; new onset or worsening).      Gets tired and dizzy when he is up 10. CAUSE: "What do you think caused the fall (or falling)?" (e.g., tripped, dizzy spell)       Tripped  Protocols used: FALLS AND FALLING-A-AH

## 2021-03-31 NOTE — ED Notes (Signed)
Transport requested

## 2021-03-31 NOTE — Consult Note (Signed)
Pulmonary Medicine          Date: 04/01/2021,   MRN# [id number] Samuel Bryan. Mar 20, 1927     AdmissionWeight: 59 kg                 CurrentWeight: 59 kg   Cc:Hhypercapnic respiratory failure cc  ReRRRRspiratory failure HISTORY OF PRESENT ILLNESS   This is an elderly male, stage 3b renal disease, chronic cough, diabetes, hypertension, cad, bradycardia,  ex smoker,  who up to recently was quite active, mowing grass. He has , per family became less active, weaker, decrease appetite, c/o feeling feverish. more short of breath. Hence brought to the Er. On work up noted to have low sats and elevated pco2 ( 83), oh 7.31. cxr showed elevated right diaphragm and basal scarring. Chest was done which is noted below. No frank pneumonia. 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 No frank angine or edema, no pleurisy or calf pain.     PAST MEDICAL HISTORY   Past Medical History:  Diagnosis Date  . Anemia   . Atherosclerosis of renal artery ([place])   . Essential hypertension, benign   . Occlusion and stenosis of carotid artery without mention of cerebral infarction    moderate left ICA stenosis  . Peripheral vascular disease, unspecified ([place])    mild lifestyle limiting claudication  . Pure hypercholesterolemia   . Renal insufficiency   . Type 2 diabetes mellitus ([place])      SURGICAL HISTORY   Past Surgical History:  Procedure Laterality Date  . APPENDECTOMY  1970's  . COLONOSCOPY    . coronary artery stenting  1999   at [facility]   . ENDARTERECTOMY Left 12/18/2017   Procedure: ENDARTERECTOMY CAROTID WITH PATCH;  Surgeon: Delana Meyer Dolores Lory, MD;  Location: [facility] ORS;  Service: Vascular;  Laterality: Left;  . FACIAL LACERATION REPAIR Left 12/13/2015   performed at [facility] by Dr. Marcial Pacas  . renal angiography    . VISCERAL ANGIOGRAPHY N/A 08/19/2018   Procedure: VISCERAL ANGIOGRAPHY;  Surgeon: Katha Cabal, MD;  Location: [facility];  Service:  Cardiovascular;  Laterality: N/A;     FAMILY HISTORY   Family History  Problem Relation Age of Onset  . Diabetes Mother   . Heart disease Mother   . Hypertension Brother   . Hyperlipidemia Brother   . Diabetes Brother   . Hypertension Son   . Hypertension Son   . Birth defects Neg Hx   . Stroke Neg Hx      SOCIAL HISTORY   Social History   Tobacco Use  . Smoking status: Former Smoker    Types: Cigarettes    Quit date: 10/29/1970    Years since quitting: 50.4  . Smokeless tobacco: Never Used  Vaping Use  . Vaping Use: Never used  Substance Use Topics  . Alcohol use: No    Alcohol/week: 0.0 standard drinks  . Drug use: No     MEDICATIONS    Home Medication:  Current Outpatient Rx  . Order #: [id number] Class: Historical Med  . Order #: [id number] Class: Historical Med  . Order #: [id number] Class: Normal  . Order #: [id number] Class: Historical Med  . Order #: [id number] Class: Historical Med  . Order #: [id number] Class: Historical Med  . Order #: [id number] Class: Historical Med  . Order #: [id number] Class: Normal  . Order #: [id number] Class: Historical Med  . Order #: [id number] Class: Print  . Order #: [id number] Class: Normal  . Order #:  [id number] Class: Normal    Current Medication:  Current Facility-Administered Medications:  .  sodium zirconium cyclosilicate (LOKELMA) packet 10 g, 10 g, Oral, Daily, Agbata, Tochukwu, MD  Current Outpatient Medications:  .  aspirin EC 81 MG tablet, Take 81 mg by mouth daily. , Disp: , Rfl:  .  cholecalciferol (VITAMIN D3) 25 MCG (1000 UNIT) tablet, Take 1,000 Units by mouth 2 (two) times a week., Disp: , Rfl:  .  clopidogrel (PLAVIX) 75 MG tablet, TAKE 1 TABLET DAILY. (Patient taking differently: Take 75 mg by mouth daily.), Disp: 90 tablet, Rfl: 0 .  docusate sodium (COLACE) 100 MG capsule, Take 100 mg by mouth daily., Disp: , Rfl:  .  furosemide (LASIX) 40 MG tablet, Take 20 mg by mouth daily., Disp: , Rfl:  .  guaifenesin (HUMIBID  E) 400 MG TABS tablet, Take 400 mg by mouth every other day., Disp: , Rfl:  .  latanoprost (XALATAN) 0.005 % ophthalmic solution, Place 1 drop into both eyes at bedtime., Disp: , Rfl:  .  linagliptin (TRADJENTA) 5 MG TABS tablet, Take 1 tablet (5 mg total) by mouth daily. For diabetes, Disp: 90 tablet, Rfl: 3 .  losartan (COZAAR) 100 MG tablet, Take 50 mg by mouth daily., Disp: , Rfl:  .  Nutritional Supplements (ENSURE ACTIVE HIGH PROTEIN) LIQD, Take 1 Can by mouth in the morning and at bedtime., Disp: 14220 mL, Rfl: 5 .  timolol (BETIMOL) 0.5 % ophthalmic solution, Place 1 drop into both eyes 2 (two) times daily., Disp: 10 mL, Rfl: 12 .  vitamin B-12 (CYANOCOBALAMIN) 1000 MCG tablet, Take 1 tablet (1,000 mcg total) by mouth daily. (Patient taking differently: Take 5,000 mcg by mouth daily.), Disp: 90 tablet, Rfl: 3    ALLERGIES   Patient has no known allergies.     REVIEW OF SYSTEMS    Review of Systems: per family mostly  Gen:  Denies  fever, sweats, chills weigh loss  HEENT: Denies blurred vision, double vision, ear pain, eye pain, hearing loss, nose bleeds, sore throat Cardiac:  No dizziness, chest pain or heaviness, chest tightness,edema Resp:   Denies cough or sputum porduction, shortness of breath, ? wheezing, hemoptysis,  Gi: Denies swallowing difficulty, stomach pain, nausea or vomiting, diarrhea, constipation, bowel incontinence Gu:  Denies bladder incontinence, burning urine Ext:   Denies Joint pain, stiffness or swelling Skin: Denies  skin rash, easy bruising or bleeding or hives Endoc:  Denies polyuria, polydipsia , polyphagia or weight change Psych:   Denies depression, insomnia or hallucinations   Other:  All other systems negative   VS: BP (!) 176/58   Pulse 69   Temp 98.5 F (36.9 C) (Oral)   Resp 11   Ht '5\' 6"'$  (1.676 m)   Wt 59 kg   SpO2 100%   BMI 20.98 kg/m      PHYSICAL EXAM    GENERAL:NAD, Ramireno 02 on, poor wave form on sat monitor. Thin,  frial HEAD: Normocephalic, atraumatic.  EYES: Pupils equal, round, reactive to light. Extraocular muscles intact. No scleral icterus.  MOUTH: Moist mucosal membrane. Dentition intact. No abscess noted.  EAR, NOSE, THROAT: Clear without exudates. No external lesions. Hearing aid in place NECK: Supple. No thyromegaly. No nodules. No JVD.  PULMONARY: Diffuse coarse bs,  +wheezes CARDIOVASCULAR: S1 and S2. Regular rate and rhythm. No murmurs, rubs, or gallops. No edema. Pedal pulses 2+ bilaterally.  GASTROINTESTINAL: Soft, nontender, nondistended. No masses. Positive bowel sounds. No hepatosplenomegaly.  MUSCULOSKELETAL: No swelling, +  clubbing, no edema. Range of motion full in all extremities.  NEUROLOGIC: Cranial nerves II through XII are intact., except hard of hearing.  No gross focal neurological deficits. But frial and weak. Sensation intact. Reflexes intact.  SKIN: No ulceration, lesions, rashes, or cyanosis. Skin warm and dry. Turgor intact.  PSYCHIATRIC: Mood, affect within normal limits. The patient is awake, alert and oriented x 3. Insight, judgment intact.       IMAGING    DG Chest 2 View  Result Date: 04/04/2021 CLINICAL DATA:  Short of breath EXAM: CHEST - 2 VIEW COMPARISON:  09/23/2017 FINDINGS: Elevated right hemidiaphragm with colonic gas below the right hemidiaphragm. This has improved from the prior study. There is mild right lower lobe airspace disease. Likely atelectasis. Heart size is normal. Coronary artery calcification and aortic arch calcification. Negative for heart failure. Left lung is clear. No effusion. IMPRESSION: Elevated right hemidiaphragm with right lower lobe atelectasis. Remaining lungs clear. Electronically Signed   By: Franchot Gallo M.D.   On: 03/29/2021 11:36   CT Head Wo Contrast  Result Date: 04/15/2021 CLINICAL DATA:  Fall, head trauma. EXAM: CT HEAD WITHOUT CONTRAST TECHNIQUE: Contiguous axial images were obtained from the base of the skull through  the vertex without intravenous contrast. COMPARISON:  None. FINDINGS: Brain: Mild cerebral atrophy. Mild white matter hypodensity bilaterally. Negative for acute infarct, hemorrhage, mass. Vascular: Negative for hyperdense vessel Skull: Negative Sinuses/Orbits: Mucosal edema and bony thickening left sphenoid sinus. Remaining sinuses clear. No orbital lesion. Other: None IMPRESSION: No acute abnormality.  Mild atrophy and mild white matter ischemia. Electronically Signed   By: Franchot Gallo M.D.   On: 04/19/2021 11:55   CT CHEST WO CONTRAST  Result Date: 03/30/2021 CLINICAL DATA:  Pneumonia, hypoxia EXAM: CT CHEST WITHOUT CONTRAST TECHNIQUE: Multidetector CT imaging of the chest was performed following the standard protocol without IV contrast. COMPARISON:  CT abdomen pelvis, 07/25/2018 FINDINGS: Cardiovascular: Aortic atherosclerosis. Normal heart size. Extensive 3 vessel coronary artery calcifications and/or stents. No pericardial effusion. Mediastinum/Nodes: No enlarged mediastinal, hilar, or axillary lymph nodes. Thyroid gland, trachea, and esophagus demonstrate no significant findings. Lungs/Pleura: Unchanged scarring of the right lung base with elevation of the right hemidiaphragm and pleural calcifications in the right lung base. Minimal centrilobular emphysema. No pleural effusion or pneumothorax. Upper Abdomen: No acute abnormality.  Gallstones in the gallbladder. Musculoskeletal: No chest wall mass or suspicious bone lesions identified. IMPRESSION: 1. Unchanged scarring of the right lung base with elevation of the right hemidiaphragm and pleural calcifications in the right lung base. Findings are consistent with sequelae of prior infection or inflammation. 2. No acute airspace opacity. 3. Minimal emphysema. 4. Coronary artery disease. 5. Cholelithiasis. Aortic Atherosclerosis (ICD10-I70.0) and Emphysema (ICD10-J43.9). Electronically Signed   By: Eddie Candle M.D.   On: 04/11/2021 14:49     Results  for Samuel Bryan, Samuel Bryan (MRN [id number]) as of 04/17/2021 15:04  Ref. Range 04/23/2021 11:12 04/12/2021 11:35 04/04/2021 11:48 04/23/2021 14:31  pH, Ven Latest Ref Range: 7.250 - 7.430   7.31    pCO2, Ven Latest Ref Range: 44.0 - 60.0 mmHg  83 (HH)    pO2, Ven Latest Ref Range: 32.0 - 45.0 mmHg  PENDING    Acid-Base Excess Latest Ref Range: 0.0 - 2.0 mmol/L  12.1 (H)    Bicarbonate Latest Ref Range: 20.0 - 28.0 mmol/L  41.8 (H)    O2 Saturation Latest Units: %  48.2    Patient temperature Unknown  37.0    Collection  site Unknown  [facility]    Lactic Acid, Venous Latest Ref Range: 0.5 - 1.9 mmol/L  0.8    RESP PANEL BY RT-PCR (FLU A&B, COVID) ARPGX2 Unknown  Rpt    Influenza A By PCR Latest Ref Range: NEGATIVE   NEGATIVE    Influenza B By PCR Latest Ref Range: NEGATIVE   NEGATIVE    SARS Coronavirus 2 by RT PCR Latest Ref Range: NEGATIVE   NEGATIVE      Results for JEANLUC, KELLEN (MRN [id number]) as of 04/17/2021 15:04  Ref. Range 12/08/2020 08:11 12/19/2020 09:25 04/25/2021 10:51 04/02/2021 11:04 04/23/2021 11:35  Sodium Latest Ref Range: 135 - 145 mmol/L   137    Potassium Latest Ref Range: 3.5 - 5.1 mmol/L 5.1  5.3 (H)    Chloride Latest Ref Range: 98 - 111 mmol/L   97 (L)    CO2 Latest Ref Range: 22 - 32 mmol/L   33 (H)    Glucose Latest Ref Range: 70 - 99 mg/dL   139 (H)    BUN Latest Ref Range: 8 - 23 mg/dL   30 (H)    Creatinine Latest Ref Range: 0.61 - 1.24 mg/dL   1.59 (H)    Calcium Latest Ref Range: 8.9 - 10.3 mg/dL   9.2    Anion gap Latest Ref Range: 5 - 15    7    Alkaline Phosphatase Latest Ref Range: 38 - 126 U/L   49    Albumin Latest Ref Range: 3.5 - 5.0 g/dL   3.6    AST Latest Ref Range: 15 - 41 U/L   17    ALT Latest Ref Range: 0 - 44 U/L   11    Total Protein Latest Ref Range: 6.5 - 8.1 g/dL   7.3    Bilirubin, Direct Latest Ref Range: 0.0 - 0.2 mg/dL   <0.1    Indirect Bilirubin Latest Ref Range: 0.3 - 0.9 mg/dL   NOT CALCULATED    Total Bilirubin Latest Ref Range: 0.3 - 1.2 mg/dL    0.7    GFR, Estimated Latest Ref Range: >60 mL/min   40 (L)    Troponin I (High Sensitivity) Latest Ref Range: <18 ng/L    74 (H)   Lactic Acid, Venous Latest Ref Range: 0.5 - 1.9 mmol/L     0.8  WBC Latest Ref Range: 4.0 - 10.5 K/uL   3.8 (L)    RBC Latest Ref Range: 4.22 - 5.81 MIL/uL   3.73 (L)    Hemoglobin Latest Ref Range: 13.0 - 17.0 g/dL   11.3 (L)    HCT Latest Ref Range: 39.0 - 52.0 %   37.5 (L)    MCV Latest Ref Range: 80.0 - 100.0 fL   100.5 (H)    MCH Latest Ref Range: 26.0 - 34.0 pg   30.3    MCHC Latest Ref Range: 30.0 - 36.0 g/dL   30.1    RDW Latest Ref Range: 11.5 - 15.5 %   14.8    Platelets Latest Ref Range: 150 - 400 K/uL   207    nRBC Latest Ref Range: 0.0 - 0.2 %   0.0    Hemoglobin A1C Latest Ref Range: 4.0 - 5.6 %  5.9 (A)      ASSESSMENT/PLAN   87 yo male with hypoxia and hypocapnia. Ddx: copd, hypoventilation due to ? Sepsis, hypothyroidism etc, or deconditioned/muscle weakness. His right diaphragm is elevated could the diaphragm be paralyzed. He  may also have pnemonia in a guy that looks dehydrated and the pneumonia will blossom when hydrated -cautious hydration -blood cultures -procalcitonin -sat up to 92 % -duo nebs q tid -tsh, mag, phos -cardic serial enzymes -sniff test when stable -cxr in am -nutrition support -dvt prophylaxis -following     Thank you for allowing me to participate in the care of this patient.   Patient/Family are satisfied with care plan and all questions have been answered.  This document was prepared using Dragon voice recognition software and may include unintentional dictation errors.     Wallene Huh, M.D.  Division of [place]

## 2021-03-31 NOTE — ED Triage Notes (Signed)
First Nurse Note:  Sent from [facility] for ED evaluation.  Patient fell 3 days ago and hit head.  No LOC.  Arrives with c/o dizziness and sob this morning.  AAOx3.  Skin warm and dry. NAD

## 2021-03-31 NOTE — ED Notes (Signed)
Pt placed on 3L BNC. Oxygen sensor not reading accurately.

## 2021-04-01 ENCOUNTER — Other Ambulatory Visit: Payer: Self-pay

## 2021-04-01 ENCOUNTER — Encounter: Payer: Self-pay | Admitting: Internal Medicine

## 2021-04-01 DIAGNOSIS — E1122 Type 2 diabetes mellitus with diabetic chronic kidney disease: Secondary | ICD-10-CM | POA: Diagnosis not present

## 2021-04-01 DIAGNOSIS — J9601 Acute respiratory failure with hypoxia: Secondary | ICD-10-CM | POA: Diagnosis not present

## 2021-04-01 DIAGNOSIS — N184 Chronic kidney disease, stage 4 (severe): Secondary | ICD-10-CM | POA: Diagnosis not present

## 2021-04-01 DIAGNOSIS — E875 Hyperkalemia: Secondary | ICD-10-CM

## 2021-04-01 DIAGNOSIS — G9341 Metabolic encephalopathy: Secondary | ICD-10-CM

## 2021-04-01 DIAGNOSIS — I10 Essential (primary) hypertension: Secondary | ICD-10-CM | POA: Diagnosis not present

## 2021-04-01 DIAGNOSIS — E440 Moderate protein-calorie malnutrition: Secondary | ICD-10-CM

## 2021-04-01 LAB — CBC
HCT: 45.3 % (ref 39.0–52.0)
Hemoglobin: 13.2 g/dL (ref 13.0–17.0)
MCH: 30.3 pg (ref 26.0–34.0)
MCHC: 29.1 g/dL — ABNORMAL LOW (ref 30.0–36.0)
MCV: 103.9 fL — ABNORMAL HIGH (ref 80.0–100.0)
Platelets: 237 10*3/uL (ref 150–400)
RBC: 4.36 MIL/uL (ref 4.22–5.81)
RDW: 13.9 % (ref 11.5–15.5)
WBC: 7.3 10*3/uL (ref 4.0–10.5)
nRBC: 0.3 % — ABNORMAL HIGH (ref 0.0–0.2)

## 2021-04-01 LAB — LACTIC ACID, PLASMA: Lactic Acid, Venous: 1.6 mmol/L (ref 0.5–1.9)

## 2021-04-01 LAB — BASIC METABOLIC PANEL
Anion gap: 10 (ref 5–15)
BUN: 37 mg/dL — ABNORMAL HIGH (ref 8–23)
CO2: 34 mmol/L — ABNORMAL HIGH (ref 22–32)
Calcium: 8.8 mg/dL — ABNORMAL LOW (ref 8.9–10.3)
Chloride: 96 mmol/L — ABNORMAL LOW (ref 98–111)
Creatinine, Ser: 1.7 mg/dL — ABNORMAL HIGH (ref 0.61–1.24)
GFR, Estimated: 37 mL/min — ABNORMAL LOW (ref >60)
Glucose, Bld: 159 mg/dL — ABNORMAL HIGH (ref 70–99)
Potassium: 6 mmol/L — ABNORMAL HIGH (ref 3.5–5.1)
Sodium: 140 mmol/L (ref 135–145)

## 2021-04-01 LAB — GLUCOSE, CAPILLARY: Glucose-Capillary: 102 mg/dL — ABNORMAL HIGH (ref 70–99)

## 2021-04-01 LAB — PROCALCITONIN: Procalcitonin: <0.1 ng/mL

## 2021-04-01 LAB — POTASSIUM
Potassium: 6.3 mmol/L (ref 3.5–5.1)
Potassium: 5 mmol/L (ref 3.5–5.1)
Potassium: 5.5 mmol/L — ABNORMAL HIGH (ref 3.5–5.1)

## 2021-04-01 MED ORDER — INSULIN ASPART 100 UNIT/ML IV SOLN
10.0000 [IU] | Freq: Once | INTRAVENOUS | Status: AC
  Administered 2021-04-01: 10 [IU] via INTRAVENOUS
  Filled 2021-04-01: qty 0.1

## 2021-04-01 MED ORDER — DEXTROSE 50 % IV SOLN
25.0000 mL | Freq: Once | INTRAVENOUS | Status: AC
  Administered 2021-04-01: 25 mL via INTRAVENOUS
  Filled 2021-04-01: qty 50

## 2021-04-01 MED ORDER — SODIUM ZIRCONIUM CYCLOSILICATE 10 G PO PACK
10.0000 g | PACK | Freq: Once | ORAL | Status: AC
  Administered 2021-04-01: 10 g via ORAL
  Filled 2021-04-01: qty 1

## 2021-04-01 MED ORDER — SODIUM CHLORIDE 0.9 % IV SOLN: INTRAVENOUS | Status: DC

## 2021-04-01 MED ORDER — SODIUM POLYSTYRENE SULFONATE 15 GM/60ML PO SUSP
30.0000 g | Freq: Once | ORAL | Status: AC
  Administered 2021-04-01: 30 g via RECTAL
  Filled 2021-04-01: qty 120

## 2021-04-01 NOTE — Progress Notes (Addendum)
[place] [facility] [place] NAME: Samuel Bryan    MR#:  [id number]  DATE OF BIRTH:  [birth year]  SUBJECTIVE:  came in from [facility] after found to have hypoxia on room air 288%. Patient is a poor historian very hard on hearing history is obtained from daughter [name] in the room. Patient according to her is otherwise independent, drives, does some forming. Felt three days ago, poor PO intake and overall decline since. Patient is awake answered couple questions however not in detail due to hearing problem.  REVIEW OF SYSTEMS:   Review of Systems  Unable to perform ROS: Mental status change   Tolerating Diet: Tolerating PT:   DRUG ALLERGIES:  No Known Allergies  VITALS:  Blood pressure (!) 118/58, pulse 66, temperature 97.9 F (36.6 C), resp. rate (!) 22, height '5\' 6"'$  (1.676 m), weight 59 kg, SpO2 (!) 49 %.  PHYSICAL EXAMINATION:   Physical Exam  GENERAL:  87 y.o.-year-old patient lying in the bed with no acute distress. Appears chronically ill thin cachectic LUNGS: Normal breath sounds bilaterally, no wheezing, rales, rhonchi. No use of accessory muscles of respiration.  CARDIOVASCULAR: S1, S2 normal. No murmurs, rubs, or gallops.  ABDOMEN: Soft, nontender, nondistended. Bowel sounds present. No organomegaly or mass.  EXTREMITIES: No cyanosis, clubbing or edema b/l.    NEUROLOGIC: unable to understand secondary to  hearing problem. Moves all extremities well. Appears weak PSYCHIATRIC:  patient is alert  SKIN: No obvious rash, lesion, or ulcer.   LABORATORY PANEL:  CBC Recent Labs  Lab 04/01/21 0532  WBC 7.3  HGB 13.2  HCT 45.3  PLT 237    Chemistries  Recent Labs  Lab 04/21/2021 1051 04/07/2021 1649 04/01/21 0532 04/01/21 0811 04/01/21 1126  NA 137  --  140  --   --   K 5.3*  --  6.0*   < > 5.0  CL 97*  --  96*  --   --   CO2 33*  --  34*  --   --   GLUCOSE 139*  --  159*  --   --   BUN 30*  --  37*  --   --    CREATININE 1.59* 1.52* 1.70*  --   --   CALCIUM 9.2  --  8.8*  --   --   MG  --  2.3  --   --   --   AST 17  --   --   --   --   ALT 11  --   --   --   --   ALKPHOS 49  --   --   --   --   BILITOT 0.7  --   --   --   --    < > = values in this interval not displayed.   Cardiac Enzymes No results for input(s): TROPONINI in the last 168 hours. RADIOLOGY:  DG Chest 2 View  Result Date: 04/19/2021 CLINICAL DATA:  Short of breath EXAM: CHEST - 2 VIEW COMPARISON:  09/23/2017 FINDINGS: Elevated right hemidiaphragm with colonic gas below the right hemidiaphragm. This has improved from the prior study. There is mild right lower lobe airspace disease. Likely atelectasis. Heart size is normal. Coronary artery calcification and aortic arch calcification. Negative for heart failure. Left lung is clear. No effusion. IMPRESSION: Elevated right hemidiaphragm with right lower lobe atelectasis. Remaining lungs clear. Electronically Signed   By: Franchot Gallo  M.D.   On: 04/21/2021 11:36   CT Head Wo Contrast  Result Date: 04/11/2021 CLINICAL DATA:  Fall, head trauma. EXAM: CT HEAD WITHOUT CONTRAST TECHNIQUE: Contiguous axial images were obtained from the base of the skull through the vertex without intravenous contrast. COMPARISON:  None. FINDINGS: Brain: Mild cerebral atrophy. Mild white matter hypodensity bilaterally. Negative for acute infarct, hemorrhage, mass. Vascular: Negative for hyperdense vessel Skull: Negative Sinuses/Orbits: Mucosal edema and bony thickening left sphenoid sinus. Remaining sinuses clear. No orbital lesion. Other: None IMPRESSION: No acute abnormality.  Mild atrophy and mild white matter ischemia. Electronically Signed   By: Franchot Gallo M.D.   On: 04/23/2021 11:55   CT CHEST WO CONTRAST  Result Date: 04/19/2021 CLINICAL DATA:  Pneumonia, hypoxia EXAM: CT CHEST WITHOUT CONTRAST TECHNIQUE: Multidetector CT imaging of the chest was performed following the standard protocol without IV  contrast. COMPARISON:  CT abdomen pelvis, 07/25/2018 FINDINGS: Cardiovascular: Aortic atherosclerosis. Normal heart size. Extensive 3 vessel coronary artery calcifications and/or stents. No pericardial effusion. Mediastinum/Nodes: No enlarged mediastinal, hilar, or axillary lymph nodes. Thyroid gland, trachea, and esophagus demonstrate no significant findings. Lungs/Pleura: Unchanged scarring of the right lung base with elevation of the right hemidiaphragm and pleural calcifications in the right lung base. Minimal centrilobular emphysema. No pleural effusion or pneumothorax. Upper Abdomen: No acute abnormality.  Gallstones in the gallbladder. Musculoskeletal: No chest wall mass or suspicious bone lesions identified. IMPRESSION: 1. Unchanged scarring of the right lung base with elevation of the right hemidiaphragm and pleural calcifications in the right lung base. Findings are consistent with sequelae of prior infection or inflammation. 2. No acute airspace opacity. 3. Minimal emphysema. 4. Coronary artery disease. 5. Cholelithiasis. Aortic Atherosclerosis (ICD10-I70.0) and Emphysema (ICD10-J43.9). Electronically Signed   By: Eddie Candle M.D.   On: 04/10/2021 14:49   ASSESSMENT AND PLAN:  Samuel Bryan. is a 87 y.o. male with medical history significant for hypertension, diabetes mellitus with stage III chronic kidney disease, coronary artery disease, ex-smoker who was sent to the emergency room from his primary care provider's office for evaluation of hypoxia. His daughter states that he fell 3 days prior to his admission.  He was trying to get into a chair when he fell on his knees.  He complains of having intermittent episodes of dizziness and lightheadedness and was assisted up without any difficulty.  Acute metabolic encephalopathy appears multifactorial unclear etiology -- suspect dehydration, poor PO intake, recent fall -- patient very hard on hearing  Acute on chronic CKD stage  IV hyperkalemia -- baseline creatinine 1.5 -- came in with creatinine of 1.7 and hyperkalemia -- treated this morning with insulin, dextrose and Kayexalate enema -- potassium was 6.3---5.0 -- continue IV fluids -- nephrology consultation with Dr. Candiss Norse  Acute respiratory failure with transient hypoxia -- patient not in respiratory distress. -- Patient has has history of COPD and emphysema in the past -- wean oxygen as tolerated -- chest x-ray negative for pneumonia -- Pro calcitonin negative, no fever, WBC normal, no evidence of infection/PNA on CT chest--Hold for antibiotic at present --COVID and Flu negative  Status post fall -- PT to see patient  Hypertension -- low-dose amlodipine  Poor PO intake and altered mental status with cough -- speech therapy to see patient -- will keep patient NPO   Type II diabetes with CKD stage IV -- SSI for now  Malnutrition--moderate to severe --Dietitian to see -ST to see  Palliative care to see patient for goals of care  Procedures: Family communication : daughter checking the room consults : nephrology CODE STATUS: full DVT Prophylaxis : Level of care: Med-Surg Status is: inpateint    Dispo: The patient is from: Home              Anticipated d/c is to: TBD              Patient currently is not medically stable to d/c.   Difficult to place patient No   Patient came in with hypoxic respiratory failure and altered mental status. Has elevated potassium and acute on chronic kidney disease. Currently getting IV fluids awaiting speech therapy evaluation and nephrology consultation.      TOTAL TIME TAKING CARE OF THIS PATIENT: 25 minutes.  >50% time spent on counselling and coordination of care  Note: This dictation was prepared with Dragon dictation along with smaller phrase technology. Any transcriptional errors that result from this process are unintentional.  Fritzi Mandes M.D    [facility]   CC: Primary care  physician; Delsa Grana, PA-CPatient ID: Samuel Lope., male   DOB: 1926-12-24, 87 y.o.   MRN: [id number]

## 2021-04-01 NOTE — Plan of Care (Signed)
  Problem: Clinical Measurements: Goal: Will remain free from infection Outcome: Progressing   Problem: Clinical Measurements: Goal: Diagnostic test results will improve Outcome: Progressing   Problem: Clinical Measurements: Goal: Respiratory complications will improve Outcome: Progressing   Problem: Clinical Measurements: Goal: Cardiovascular complication will be avoided Outcome: Progressing   Problem: Coping: Goal: Level of anxiety will decrease Outcome: Progressing   Problem: Elimination: Goal: Will not experience complications related to bowel motility Outcome: Progressing Goal: Will not experience complications related to urinary retention Outcome: Progressing

## 2021-04-01 NOTE — Progress Notes (Signed)
PT Cancellation Note  Patient Details Name: Samuel Bryan. MRN: [id number] DOB: 1927/09/28   Cancelled Treatment:    Reason Eval/Treat Not Completed: Patient not medically ready PT orders received, chart reviewed. Pt noted to have critically high K+ at this time (6.3) & exertional activity is contraindicated. Will f/u as able & as pt is medically appropriate to participate.   Lavone Nian, PT, DPT 04/01/21, 10:12 AM   [name] 04/01/2021, 10:12 AM

## 2021-04-01 NOTE — Progress Notes (Signed)
[place], [place] 04/01/21  Subjective:   Hospital day # 0   Patient known to our practice from outpatient follow-up.  Information obtained from outpatient chart as well as review of inpatient chart and patient's daughter.  She reports that patient was in his usual state of health which include he is independent, able to mow his yard and make some meals for himself.  3 days prior to admission, he fell on his knees.  He has been complaining of intermittent dizziness lightheadedness.  He had a cough, with clear sputum.  He also reported loose stools for 2 to 3 days prior to admission.  Found to have acute kidney injury with increased creatinine of 1.70 and hyperkalemia.  Nephrology consult requested for evaluation.  Today, patient appears weak.  He is getting IV fluids for volume repletion.  Blood pressure remains low  Renal: No intake/output data recorded. Lab Results  Component Value Date   CREATININE 1.70 (H) 04/01/2021   CREATININE 1.52 (H) 04/22/2021   CREATININE 1.59 (H) 03/30/2021     Objective:  Vital signs in last 24 hours:  Temp:  [97.4 F (36.3 C)-97.9 F (36.6 C)] 97.9 F (36.6 C) (06/04 0919) Pulse Rate:  [62-76] 66 (06/04 0919) Resp:  [11-22] 22 (06/04 0919) BP: (118-184)/(57-126) 118/58 (06/04 0919) SpO2:  [49 %-100 %] 49 % (06/04 0919)  Weight change:  Filed Weights   04/02/2021 1045  Weight: 59 kg    Intake/Output:   No intake or output data in the 24 hours ending 04/01/21 1054   Physical Exam: General:  Frail, elderly, laying in the bed  HEENT  dry oral mucous membranes  Pulm/lungs  normal breathing effort, coarse breath sounds at base  CVS/Heart  no rub  Abdomen:   Soft, nontender  Extremities:  No peripheral edema  Neurologic:  Hard of hearing but able to follow simple commands, yes/no questions  Skin:  No acute rashes  Access:        Basic Metabolic Panel:  Recent Labs  Lab 04/13/2021 1051 04/13/2021 1649  04/01/21 0532 04/01/21 0811  NA 137  --  140  --   K 5.3*  --  6.0* 6.3*  CL 97*  --  96*  --   CO2 33*  --  34*  --   GLUCOSE 139*  --  159*  --   BUN 30*  --  37*  --   CREATININE 1.59* 1.52* 1.70*  --   CALCIUM 9.2  --  8.8*  --   MG  --  2.3  --   --      CBC: Recent Labs  Lab 04/22/2021 1051 04/06/2021 1649 04/01/21 0532  WBC 3.8* 4.2 7.3  HGB 11.3* 11.7* 13.2  HCT 37.5* 39.9 45.3  MCV 100.5* 102.6* 103.9*  PLT 207 199 237     No results found for: HEPBSAG, HEPBSAB, HEPBIGM    Microbiology:  Recent Results (from the past 240 hour(s))  Resp Panel by RT-PCR (Flu A&B, Covid) Nasopharyngeal Swab     Status: None   Collection Time: 04/21/2021 11:35 AM   Specimen: Nasopharyngeal Swab; Nasopharyngeal(NP) swabs in vial transport medium  Result Value Ref Range Status   SARS Coronavirus 2 by RT PCR NEGATIVE NEGATIVE Final    Comment: (NOTE) SARS-CoV-2 target nucleic acids are NOT DETECTED.  The SARS-CoV-2 RNA is generally detectable in upper respiratory specimens during the acute phase of infection. The lowest concentration of SARS-CoV-2 viral copies this assay  can detect is 138 copies/mL. A negative result does not preclude SARS-Cov-2 infection and should not be used as the sole basis for treatment or other patient management decisions. A negative result may occur with  improper specimen collection/handling, submission of specimen other than nasopharyngeal swab, presence of viral mutation(s) within the areas targeted by this assay, and inadequate number of viral copies(<138 copies/mL). A negative result must be combined with clinical observations, patient history, and epidemiological information. The expected result is Negative.  Fact Sheet for Patients:  EntrepreneurPulse.com.au  Fact Sheet for Healthcare Providers:  IncredibleEmployment.be  This test is no t yet approved or cleared by the [place] FDA and  has been  authorized for detection and/or diagnosis of SARS-CoV-2 by FDA under an Emergency Use Authorization (EUA). This EUA will remain  in effect (meaning this test can be used) for the duration of the COVID-19 declaration under Section 564(b)(1) of the Act, 21 U.S.C.section 360bbb-3(b)(1), unless the authorization is terminated  or revoked sooner.       Influenza A by PCR NEGATIVE NEGATIVE Final   Influenza B by PCR NEGATIVE NEGATIVE Final    Comment: (NOTE) The Xpert Xpress SARS-CoV-2/FLU/RSV plus assay is intended as an aid in the diagnosis of influenza from Nasopharyngeal swab specimens and should not be used as a sole basis for treatment. Nasal washings and aspirates are unacceptable for Xpert Xpress SARS-CoV-2/FLU/RSV testing.  Fact Sheet for Patients: EntrepreneurPulse.com.au  Fact Sheet for Healthcare Providers: IncredibleEmployment.be  This test is not yet approved or cleared by the [place] FDA and has been authorized for detection and/or diagnosis of SARS-CoV-2 by FDA under an Emergency Use Authorization (EUA). This EUA will remain in effect (meaning this test can be used) for the duration of the COVID-19 declaration under Section 564(b)(1) of the Act, 21 U.S.C. section 360bbb-3(b)(1), unless the authorization is terminated or revoked.  Performed at [facility], [address] [place], [place] [address]     Coagulation Studies: No results for input(s): LABPROT, INR in the last 72 hours.  Urinalysis: No results for input(s): COLORURINE, LABSPEC, PHURINE, GLUCOSEU, HGBUR, BILIRUBINUR, KETONESUR, PROTEINUR, UROBILINOGEN, NITRITE, LEUKOCYTESUR in the last 72 hours.  Invalid input(s): APPERANCEUR    Imaging: DG Chest 2 View  Result Date: 04/13/2021 CLINICAL DATA:  Short of breath EXAM: CHEST - 2 VIEW COMPARISON:  09/23/2017 FINDINGS: Elevated right hemidiaphragm with colonic gas below the right hemidiaphragm. This has  improved from the prior study. There is mild right lower lobe airspace disease. Likely atelectasis. Heart size is normal. Coronary artery calcification and aortic arch calcification. Negative for heart failure. Left lung is clear. No effusion. IMPRESSION: Elevated right hemidiaphragm with right lower lobe atelectasis. Remaining lungs clear. Electronically Signed   By: Franchot Gallo M.D.   On: 04/19/2021 11:36   CT Head Wo Contrast  Result Date: 04/20/2021 CLINICAL DATA:  Fall, head trauma. EXAM: CT HEAD WITHOUT CONTRAST TECHNIQUE: Contiguous axial images were obtained from the base of the skull through the vertex without intravenous contrast. COMPARISON:  None. FINDINGS: Brain: Mild cerebral atrophy. Mild white matter hypodensity bilaterally. Negative for acute infarct, hemorrhage, mass. Vascular: Negative for hyperdense vessel Skull: Negative Sinuses/Orbits: Mucosal edema and bony thickening left sphenoid sinus. Remaining sinuses clear. No orbital lesion. Other: None IMPRESSION: No acute abnormality.  Mild atrophy and mild white matter ischemia. Electronically Signed   By: Franchot Gallo M.D.   On: 04/25/2021 11:55   CT CHEST WO CONTRAST  Result Date: 04/12/2021 CLINICAL DATA:  Pneumonia, hypoxia EXAM: CT CHEST WITHOUT CONTRAST TECHNIQUE: Multidetector CT imaging of the chest was performed following the standard protocol without IV contrast. COMPARISON:  CT abdomen pelvis, 07/25/2018 FINDINGS: Cardiovascular: Aortic atherosclerosis. Normal heart size. Extensive 3 vessel coronary artery calcifications and/or stents. No pericardial effusion. Mediastinum/Nodes: No enlarged mediastinal, hilar, or axillary lymph nodes. Thyroid gland, trachea, and esophagus demonstrate no significant findings. Lungs/Pleura: Unchanged scarring of the right lung base with elevation of the right hemidiaphragm and pleural calcifications in the right lung base. Minimal centrilobular emphysema. No pleural effusion or pneumothorax. Upper  Abdomen: No acute abnormality.  Gallstones in the gallbladder. Musculoskeletal: No chest wall mass or suspicious bone lesions identified. IMPRESSION: 1. Unchanged scarring of the right lung base with elevation of the right hemidiaphragm and pleural calcifications in the right lung base. Findings are consistent with sequelae of prior infection or inflammation. 2. No acute airspace opacity. 3. Minimal emphysema. 4. Coronary artery disease. 5. Cholelithiasis. Aortic Atherosclerosis (ICD10-I70.0) and Emphysema (ICD10-J43.9). Electronically Signed   By: Eddie Candle M.D.   On: 04/26/2021 14:49     Medications:   . sodium chloride    . sodium chloride 100 mL/hr at 04/01/21 0912   . amLODipine  5 mg Oral Daily  . aspirin EC  81 mg Oral Daily  . [START ON 04/16/2021] cholecalciferol  1,000 Units Oral Once per day on Mon Thu  . clopidogrel  75 mg Oral Daily  . docusate sodium  100 mg Oral Daily  . enoxaparin (LOVENOX) injection  30 mg Subcutaneous Q24H  . feeding supplement  237 mL Oral BID  . furosemide  20 mg Oral Daily  . latanoprost  1 drop Both Eyes QHS  . mometasone-formoterol  2 puff Inhalation BID  . sodium chloride flush  3 mL Intravenous Q12H  . sodium zirconium cyclosilicate  10 g Oral Daily  . timolol  1 drop Both Eyes BID  . vitamin B-12  1,000 mcg Oral Daily   sodium chloride, acetaminophen **OR** acetaminophen, guaiFENesin, ipratropium-albuterol, ondansetron **OR** ondansetron (ZOFRAN) IV, sodium chloride flush  Assessment/ Plan:  87 y.o. male with diabetes, hypertension, chronic kidney disease, coronary artery disease, ex-smoker   admitted on 04/01/2021 for Acute respiratory failure (HCC) [J96.00] Hypoxia [R09.02] Acute respiratory failure with hypoxia (HCC) [J96.01]  #Acute kidney injury #Acute hyperkalemia #Chronic kidney disease stage IIIa  Patient has underlying chronic kidney disease.  Risk factors include hypertension, atherosclerosis, advanced age.  Baseline creatinine  of 1.6, GFR 49 from February 2022 AKI likely secondary to volume depletion and ATN. Recommend to maintain high blood pressure greater than [id number] systolic which is usual for patient. Agree with management of hyperkalemia with shifting agents and Lokelma. Will follow closely    LOS: 0 Tonna Palazzi Candiss Norse 6/4/202210:54 AM  [place], [place]  Note: This note was prepared with Dragon dictation. Any transcription errors are unintentional

## 2021-04-02 ENCOUNTER — Inpatient Hospital Stay: Payer: Medicare PPO

## 2021-04-02 DIAGNOSIS — E1122 Type 2 diabetes mellitus with diabetic chronic kidney disease: Secondary | ICD-10-CM | POA: Diagnosis present

## 2021-04-02 DIAGNOSIS — I129 Hypertensive chronic kidney disease with stage 1 through stage 4 chronic kidney disease, or unspecified chronic kidney disease: Secondary | ICD-10-CM | POA: Diagnosis present

## 2021-04-02 DIAGNOSIS — J9622 Acute and chronic respiratory failure with hypercapnia: Secondary | ICD-10-CM | POA: Diagnosis present

## 2021-04-02 DIAGNOSIS — R570 Cardiogenic shock: Secondary | ICD-10-CM | POA: Diagnosis not present

## 2021-04-02 DIAGNOSIS — G9340 Encephalopathy, unspecified: Secondary | ICD-10-CM | POA: Diagnosis not present

## 2021-04-02 DIAGNOSIS — J9621 Acute and chronic respiratory failure with hypoxia: Secondary | ICD-10-CM | POA: Diagnosis present

## 2021-04-02 DIAGNOSIS — R109 Unspecified abdominal pain: Secondary | ICD-10-CM | POA: Diagnosis not present

## 2021-04-02 DIAGNOSIS — J9602 Acute respiratory failure with hypercapnia: Secondary | ICD-10-CM | POA: Diagnosis not present

## 2021-04-02 DIAGNOSIS — E860 Dehydration: Secondary | ICD-10-CM | POA: Diagnosis present

## 2021-04-02 DIAGNOSIS — J439 Emphysema, unspecified: Secondary | ICD-10-CM | POA: Diagnosis present

## 2021-04-02 DIAGNOSIS — E870 Hyperosmolality and hypernatremia: Secondary | ICD-10-CM | POA: Diagnosis not present

## 2021-04-02 DIAGNOSIS — N179 Acute kidney failure, unspecified: Secondary | ICD-10-CM | POA: Diagnosis not present

## 2021-04-02 DIAGNOSIS — E8809 Other disorders of plasma-protein metabolism, not elsewhere classified: Secondary | ICD-10-CM | POA: Diagnosis present

## 2021-04-02 DIAGNOSIS — G9341 Metabolic encephalopathy: Secondary | ICD-10-CM | POA: Diagnosis not present

## 2021-04-02 DIAGNOSIS — I469 Cardiac arrest, cause unspecified: Secondary | ICD-10-CM | POA: Diagnosis not present

## 2021-04-02 DIAGNOSIS — I700 Atherosclerosis of aorta: Secondary | ICD-10-CM | POA: Diagnosis present

## 2021-04-02 DIAGNOSIS — E875 Hyperkalemia: Secondary | ICD-10-CM | POA: Diagnosis not present

## 2021-04-02 DIAGNOSIS — D709 Neutropenia, unspecified: Secondary | ICD-10-CM | POA: Diagnosis not present

## 2021-04-02 DIAGNOSIS — J9811 Atelectasis: Secondary | ICD-10-CM | POA: Diagnosis present

## 2021-04-02 DIAGNOSIS — Z515 Encounter for palliative care: Secondary | ICD-10-CM | POA: Diagnosis not present

## 2021-04-02 DIAGNOSIS — N1831 Chronic kidney disease, stage 3a: Secondary | ICD-10-CM | POA: Diagnosis present

## 2021-04-02 DIAGNOSIS — E43 Unspecified severe protein-calorie malnutrition: Secondary | ICD-10-CM | POA: Diagnosis present

## 2021-04-02 DIAGNOSIS — R0902 Hypoxemia: Secondary | ICD-10-CM | POA: Diagnosis not present

## 2021-04-02 DIAGNOSIS — Z66 Do not resuscitate: Secondary | ICD-10-CM | POA: Diagnosis not present

## 2021-04-02 DIAGNOSIS — N170 Acute kidney failure with tubular necrosis: Secondary | ICD-10-CM | POA: Diagnosis present

## 2021-04-02 DIAGNOSIS — I10 Essential (primary) hypertension: Secondary | ICD-10-CM | POA: Diagnosis not present

## 2021-04-02 DIAGNOSIS — I6521 Occlusion and stenosis of right carotid artery: Secondary | ICD-10-CM | POA: Diagnosis present

## 2021-04-02 DIAGNOSIS — Z20822 Contact with and (suspected) exposure to covid-19: Secondary | ICD-10-CM | POA: Diagnosis present

## 2021-04-02 DIAGNOSIS — W19XXXD Unspecified fall, subsequent encounter: Secondary | ICD-10-CM | POA: Diagnosis not present

## 2021-04-02 DIAGNOSIS — N189 Chronic kidney disease, unspecified: Secondary | ICD-10-CM | POA: Diagnosis not present

## 2021-04-02 DIAGNOSIS — R0602 Shortness of breath: Secondary | ICD-10-CM | POA: Diagnosis present

## 2021-04-02 DIAGNOSIS — N2581 Secondary hyperparathyroidism of renal origin: Secondary | ICD-10-CM | POA: Diagnosis present

## 2021-04-02 DIAGNOSIS — R627 Adult failure to thrive: Secondary | ICD-10-CM | POA: Diagnosis present

## 2021-04-02 DIAGNOSIS — G928 Other toxic encephalopathy: Secondary | ICD-10-CM | POA: Diagnosis present

## 2021-04-02 DIAGNOSIS — J9601 Acute respiratory failure with hypoxia: Secondary | ICD-10-CM | POA: Diagnosis not present

## 2021-04-02 DIAGNOSIS — E1151 Type 2 diabetes mellitus with diabetic peripheral angiopathy without gangrene: Secondary | ICD-10-CM | POA: Diagnosis present

## 2021-04-02 DIAGNOSIS — R64 Cachexia: Secondary | ICD-10-CM | POA: Diagnosis present

## 2021-04-02 DIAGNOSIS — N184 Chronic kidney disease, stage 4 (severe): Secondary | ICD-10-CM | POA: Diagnosis not present

## 2021-04-02 LAB — BASIC METABOLIC PANEL
Anion gap: 11 (ref 5–15)
BUN: 60 mg/dL — ABNORMAL HIGH (ref 8–23)
CO2: 28 mmol/L (ref 22–32)
Calcium: 8.1 mg/dL — ABNORMAL LOW (ref 8.9–10.3)
Chloride: 102 mmol/L (ref 98–111)
Creatinine, Ser: 2.81 mg/dL — ABNORMAL HIGH (ref 0.61–1.24)
GFR, Estimated: 20 mL/min — ABNORMAL LOW (ref >60)
Glucose, Bld: 99 mg/dL (ref 70–99)
Potassium: 6.3 mmol/L (ref 3.5–5.1)
Sodium: 141 mmol/L (ref 135–145)

## 2021-04-02 LAB — AMMONIA: Ammonia: 27 umol/L (ref 9–35)

## 2021-04-02 LAB — GLUCOSE, CAPILLARY
Glucose-Capillary: 11 mg/dL — CL (ref 70–99)
Glucose-Capillary: 87 mg/dL (ref 70–99)
Glucose-Capillary: 212 mg/dL — ABNORMAL HIGH (ref 70–99)
Glucose-Capillary: 235 mg/dL — ABNORMAL HIGH (ref 70–99)
Glucose-Capillary: 214 mg/dL — ABNORMAL HIGH (ref 70–99)
Glucose-Capillary: 265 mg/dL — ABNORMAL HIGH (ref 70–99)

## 2021-04-02 LAB — VITAMIN D 25 HYDROXY (VIT D DEFICIENCY, FRACTURES): Vit D, 25-Hydroxy: 36.94 ng/mL (ref 30–100)

## 2021-04-02 LAB — PSA: Prostatic Specific Antigen: 6.58 ng/mL — ABNORMAL HIGH (ref 0.00–4.00)

## 2021-04-02 LAB — TSH: TSH: 1.033 u[IU]/mL (ref 0.350–4.500)

## 2021-04-02 LAB — POTASSIUM
Potassium: 5.9 mmol/L — ABNORMAL HIGH (ref 3.5–5.1)
Potassium: 6.2 mmol/L — ABNORMAL HIGH (ref 3.5–5.1)

## 2021-04-02 LAB — VITAMIN B12: Vitamin B-12: 5630 pg/mL — ABNORMAL HIGH (ref 180–914)

## 2021-04-02 MED ORDER — SODIUM BICARBONATE 8.4 % IV SOLN
Freq: Once | INTRAVENOUS | Status: AC
  Filled 2021-04-02: qty 1000
  Filled 2021-04-02: qty 150

## 2021-04-02 MED ORDER — INSULIN ASPART 100 UNIT/ML IV SOLN
10.0000 [IU] | Freq: Once | INTRAVENOUS | Status: AC
  Administered 2021-04-02: 10 [IU] via INTRAVENOUS
  Filled 2021-04-02: qty 0.1

## 2021-04-02 MED ORDER — SODIUM CHLORIDE 0.9 % IV BOLUS
1000.0000 mL | Freq: Once | INTRAVENOUS | Status: AC
  Administered 2021-04-02: 1000 mL via INTRAVENOUS

## 2021-04-02 MED ORDER — SODIUM BICARBONATE 8.4 % IV SOLN: 100.0000 meq | Freq: Once | INTRAVENOUS | Status: DC

## 2021-04-02 MED ORDER — SODIUM CHLORIDE 0.9 % IV SOLN
1.0000 g | INTRAVENOUS | Status: DC
  Filled 2021-04-02: qty 10

## 2021-04-02 MED ORDER — DEXTROSE 50 % IV SOLN
25.0000 g | Freq: Once | INTRAVENOUS | Status: AC
  Administered 2021-04-02: 25 g via INTRAVENOUS
  Filled 2021-04-02: qty 50

## 2021-04-02 MED ORDER — SODIUM CHLORIDE 0.9 % IV SOLN
1.0000 g | INTRAVENOUS | Status: DC
  Administered 2021-04-02 – 2021-04-05 (×3): 1 g via INTRAVENOUS
  Filled 2021-04-02: qty 1
  Filled 2021-04-02: qty 10
  Filled 2021-04-02 (×2): qty 1
  Filled 2021-04-02: qty 10

## 2021-04-02 MED ORDER — HEPARIN SODIUM (PORCINE) 5000 UNIT/ML IJ SOLN
5000.0000 [IU] | Freq: Three times a day (TID) | INTRAMUSCULAR | Status: DC
  Administered 2021-04-02 – 2021-04-08 (×13): 5000 [IU] via SUBCUTANEOUS
  Filled 2021-04-02 (×14): qty 1

## 2021-04-02 MED ORDER — SODIUM BICARBONATE 8.4 % IV SOLN
100.0000 meq | Freq: Once | INTRAVENOUS | Status: AC
  Administered 2021-04-02: 100 meq via INTRAVENOUS
  Filled 2021-04-02: qty 50

## 2021-04-02 MED ORDER — DEXTROSE 50 % IV SOLN
1.0000 | Freq: Once | INTRAVENOUS | Status: AC
  Administered 2021-04-02: 50 mL via INTRAVENOUS
  Filled 2021-04-02: qty 50

## 2021-04-02 NOTE — Plan of Care (Signed)
  Problem: Clinical Measurements: Goal: Will remain free from infection Outcome: Progressing   Problem: Clinical Measurements: Goal: Cardiovascular complication will be avoided Outcome: Progressing   Problem: Coping: Goal: Level of anxiety will decrease Outcome: Progressing   Problem: Elimination: Goal: Will not experience complications related to bowel motility Outcome: Progressing Goal: Will not experience complications related to urinary retention Outcome: Progressing   Problem: Pain Managment: Goal: General experience of comfort will improve Outcome: Progressing

## 2021-04-02 NOTE — Progress Notes (Signed)
[place] at [place] NAME: Samuel Bryan    MR#:  [id number]  DATE OF BIRTH:  [birth year]  SUBJECTIVE:    2 daughters and grand-dter in the room Pt remains lethargic. HOH. ?left facial drool with head rotated to the left. No fever Soft BP this am Not alert enough for ST to see pt REVIEW OF SYSTEMS:   Review of Systems  Unable to perform ROS: Mental status change   Tolerating Diet:NPO Tolerating PT: pending MS change  DRUG ALLERGIES:  No Known Allergies  VITALS:  Blood pressure (!) 105/41, pulse 78, temperature 98.1 F (36.7 C), temperature source Oral, resp. rate 17, height '5\' 6"'$  (1.676 m), weight 59 kg, SpO2 99 %.  PHYSICAL EXAMINATION:   Physical Examlimited due to MS and pt participation  GENERAL:  87 y.o.-year-old patient lying in the bed with no acute distress.  Thin, fraile HEENT: Head atraumatic, normocephalic. Oropharynx and nasopharynx clear. Dry eye secretions. ?left facial drool LUNGS: Normal breath sounds bilaterally, no wheezing, rales, rhonchi. No use of accessory muscles of respiration.  CARDIOVASCULAR: S1, S2 normal. No murmurs, rubs, or gallops.  ABDOMEN: Soft, nontender, nondistended.  EXTREMITIES: No cyanosis, clubbing or edema b/l.    NEUROLOGIC: unable to perform due to MS change PSYCHIATRIC:  patient is lethargic  LABORATORY PANEL:  CBC Recent Labs  Lab 04/01/21 0532  WBC 7.3  HGB 13.2  HCT 45.3  PLT 237    Chemistries  Recent Labs  Lab 04/16/2021 1051 03/29/2021 1649 04/01/21 0532 04/01/21 0811 04/01/21 1515  NA 137  --  140  --   --   K 5.3*  --  6.0*   < > 5.5*  CL 97*  --  96*  --   --   CO2 33*  --  34*  --   --   GLUCOSE 139*  --  159*  --   --   BUN 30*  --  37*  --   --   CREATININE 1.59* 1.52* 1.70*  --   --   CALCIUM 9.2  --  8.8*  --   --   MG  --  2.3  --   --   --   AST 17  --   --   --   --   ALT 11  --   --   --   --   ALKPHOS 49  --   --   --   --   BILITOT 0.7  --   --    --   --    < > = values in this interval not displayed.   Cardiac Enzymes No results for input(s): TROPONINI in the last 168 hours. RADIOLOGY:  DG Chest 2 View  Result Date: 04/07/2021 CLINICAL DATA:  Short of breath EXAM: CHEST - 2 VIEW COMPARISON:  09/23/2017 FINDINGS: Elevated right hemidiaphragm with colonic gas below the right hemidiaphragm. This has improved from the prior study. There is mild right lower lobe airspace disease. Likely atelectasis. Heart size is normal. Coronary artery calcification and aortic arch calcification. Negative for heart failure. Left lung is clear. No effusion. IMPRESSION: Elevated right hemidiaphragm with right lower lobe atelectasis. Remaining lungs clear. Electronically Signed   By: Franchot Gallo M.D.   On: 04/21/2021 11:36   CT Head Wo Contrast  Result Date: 04/21/2021 CLINICAL DATA:  Fall, head trauma. EXAM: CT HEAD WITHOUT CONTRAST TECHNIQUE: Contiguous axial images were obtained from the  base of the skull through the vertex without intravenous contrast. COMPARISON:  None. FINDINGS: Brain: Mild cerebral atrophy. Mild white matter hypodensity bilaterally. Negative for acute infarct, hemorrhage, mass. Vascular: Negative for hyperdense vessel Skull: Negative Sinuses/Orbits: Mucosal edema and bony thickening left sphenoid sinus. Remaining sinuses clear. No orbital lesion. Other: None IMPRESSION: No acute abnormality.  Mild atrophy and mild white matter ischemia. Electronically Signed   By: Franchot Gallo M.D.   On: 04/05/2021 11:55   CT CHEST WO CONTRAST  Result Date: 04/16/2021 CLINICAL DATA:  Pneumonia, hypoxia EXAM: CT CHEST WITHOUT CONTRAST TECHNIQUE: Multidetector CT imaging of the chest was performed following the standard protocol without IV contrast. COMPARISON:  CT abdomen pelvis, 07/25/2018 FINDINGS: Cardiovascular: Aortic atherosclerosis. Normal heart size. Extensive 3 vessel coronary artery calcifications and/or stents. No pericardial effusion.  Mediastinum/Nodes: No enlarged mediastinal, hilar, or axillary lymph nodes. Thyroid gland, trachea, and esophagus demonstrate no significant findings. Lungs/Pleura: Unchanged scarring of the right lung base with elevation of the right hemidiaphragm and pleural calcifications in the right lung base. Minimal centrilobular emphysema. No pleural effusion or pneumothorax. Upper Abdomen: No acute abnormality.  Gallstones in the gallbladder. Musculoskeletal: No chest wall mass or suspicious bone lesions identified. IMPRESSION: 1. Unchanged scarring of the right lung base with elevation of the right hemidiaphragm and pleural calcifications in the right lung base. Findings are consistent with sequelae of prior infection or inflammation. 2. No acute airspace opacity. 3. Minimal emphysema. 4. Coronary artery disease. 5. Cholelithiasis. Aortic Atherosclerosis (ICD10-I70.0) and Emphysema (ICD10-J43.9). Electronically Signed   By: Eddie Candle M.D.   On: 03/30/2021 14:49   ASSESSMENT AND PLAN:  Kit Deluca Jr.is a 87 y.o.malewith medical history significant forhypertension, diabetes mellitus with stage III chronic kidney disease, coronary artery disease, ex-smoker who was sent to the emergency room from his primary care provider's office for evaluation of hypoxia. His daughter states that he fell 3 days prior to his admission.He was trying to get into a chair when he fell on his knees.  Acute metabolic encephalopathy appears multifactorial unclear etiology -- suspect dehydration, poor PO intake, recent fall -- patient very hard on hearing --CT head on admission--nothing acute except mild atrophy -- 6/5-- remains lethargic despite IVF --Neurology consult--d/w Dr Rory Percy.  --MRI brain, MR Angio head and neck wo contrast (due to renal function) --no source of infection so far --UA, UC today (not sent by ER)  Acute on chronic CKD stage IV hyperkalemia -- baseline creatinine 1.5 -- came in with creatinine  of 1.7 and hyperkalemia -- treated this morning with insulin, dextrose and Kayexalate enema -- potassium was 6.3---5.0--5.5--BMP today -- continue IV fluids -- nephrology consultation with Dr. Candiss Norse --check BMP today --poor UOP--increased IVF to 150cc  Acute respiratory failure with transient hypoxia -- patient not in respiratory distress. 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  Procedures: Family communication : dters in the room consults : nephrology, neurology CODE STATUS: full DVT Prophylaxis :Lovenox Level of care: Med-Surg Status is: inpateint    Dispo: The patient is from: Home  Anticipated d/c is to: TBD  Patient currently is not medically stable to d/c.              Difficult to place patient No   Patient came in with hypoxic respiratory failure and altered mental status. Has elevated potassium and acute on chronic kidney disease.  Continues to remain lethargic. Neurology consulted. MRI Aaron Edelman pending      TOTAL TIME TAKING CARE OF THIS PATIENT: *35* minutes.  >50% time spent on counselling and coordination of care  Note: This dictation was prepared with Dragon dictation along with smaller phrase technology. Any transcriptional errors that result from this  process are unintentional.  Fritzi Mandes M.D    [facility]   CC: Primary care physician; Delsa Grana, PA-CPatient ID: Desmond Lope., male   DOB: 03/31/1927, 87 y.o.   MRN: [id number]
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
## 2021-04-02 NOTE — Progress Notes (Addendum)
Patient ID: Samuel Magann., male   DOB: August 08, 1927, 87 y.o.   MRN: [id number]  [facility] transfer center with family requesting to transfer. Spoke with sherry. awaitng for IM attending to call me back. --informed family  9:15 am  Spoke with Dr Delma Post IM At [facility]. Case discussed. Informed they are capacity and will not accept pt at present. Recommends to continue the ongoing w/u here at [facility] and call back if needed.  Family informed

## 2021-04-02 NOTE — Progress Notes (Addendum)
[place], [place] 04/02/21  Subjective:   Hospital day # 0   Patient known to our practice from outpatient follow-up.  Information obtained from outpatient chart as well as review of inpatient chart and patient's daughter.  She reports that patient was in his usual state of health which include he is independent, able to mow his yard and make some meals for himself.  3 days prior to admission, he fell on his knees.  He has been complaining of intermittent dizziness lightheadedness.  He had a cough, with clear sputum.  He also reported loose stools for 2 to 3 days prior to admission.  Found to have acute kidney injury with increased creatinine of 1.70 and hyperkalemia.  Nephrology consult requested for evaluation.    Patient seen laying in bed Daughters at bedside Currently NPO for dysphagia concerns Daughter reports patient has been losing weight over the past year. States patient has not mentioned having bloody stools.  Renal: 06/04 0701 - 06/05 0700 In: 539.3 [I.V.:539.3] Out: 175 [Urine:175] Lab Results  Component Value Date   CREATININE 1.70 (H) 04/01/2021   CREATININE 1.52 (H) 04/27/2021   CREATININE 1.59 (H) 04/02/2021     Objective:  Vital signs in last 24 hours:  Temp:  [97.5 F (36.4 C)-98.1 F (36.7 C)] 98.1 F (36.7 C) (06/05 0440) Pulse Rate:  [63-78] 78 (06/05 0440) Resp:  [17-22] 17 (06/05 0440) BP: (105-124)/(41-58) 105/41 (06/05 0440) SpO2:  [49 %-100 %] 99 % (06/05 0440)  Weight change:  Filed Weights   04/21/2021 1045  Weight: 59 kg    Intake/Output:    Intake/Output Summary (Last 24 hours) at 04/02/2021 0910 Last data filed at 04/02/2021 0441 Gross per 24 hour  Intake 539.33 ml  Output 175 ml  Net 364.33 ml     Physical Exam: General:  Frail, elderly, laying in the bed  HEENT  dry oral mucous membranes  Pulm/lungs  normal breathing effort, coarse breath sounds at base  CVS/Heart  no rub  Abdomen:   Soft, nontender   Extremities:  No peripheral edema  Neurologic:  Hard of hearing but able to follow simple commands, yes/no questions  Skin:  No acute rashes  Access:        Basic Metabolic Panel:  Recent Labs  Lab 04/24/2021 1051 04/12/2021 1649 04/01/21 0532 04/01/21 0811 04/01/21 1126 04/01/21 1515  NA 137  --  140  --   --   --   K 5.3*  --  6.0* 6.3* 5.0 5.5*  CL 97*  --  96*  --   --   --   CO2 33*  --  34*  --   --   --   GLUCOSE 139*  --  159*  --   --   --   BUN 30*  --  37*  --   --   --   CREATININE 1.59* 1.52* 1.70*  --   --   --   CALCIUM 9.2  --  8.8*  --   --   --   MG  --  2.3  --   --   --   --      CBC: Recent Labs  Lab 04/23/2021 1051 04/27/2021 1649 04/01/21 0532  WBC 3.8* 4.2 7.3  HGB 11.3* 11.7* 13.2  HCT 37.5* 39.9 45.3  MCV 100.5* 102.6* 103.9*  PLT 207 199 237     No results found for: HEPBSAG, HEPBSAB, HEPBIGM  Microbiology:  Recent Results (from the past 240 hour(s))  Resp Panel by RT-PCR (Flu A&B, Covid) Nasopharyngeal Swab     Status: None   Collection Time: 04/25/2021 11:35 AM   Specimen: Nasopharyngeal Swab; Nasopharyngeal(NP) swabs in vial transport medium  Result Value Ref Range Status   SARS Coronavirus 2 by RT PCR NEGATIVE NEGATIVE Final    Comment: (NOTE) SARS-CoV-2 target nucleic acids are NOT DETECTED.  The SARS-CoV-2 RNA is generally detectable in upper respiratory specimens during the acute phase of infection. The lowest concentration of SARS-CoV-2 viral copies this assay can detect is 138 copies/mL. A negative result does not preclude SARS-Cov-2 infection and should not be used as the sole basis for treatment or other patient management decisions. A negative result may occur with  improper specimen collection/handling, submission of specimen other than nasopharyngeal swab, presence of viral mutation(s) within the areas targeted by this assay, and inadequate number of viral copies(<138 copies/mL). A negative result must be combined  with clinical observations, patient history, and epidemiological information. The expected result is Negative.  Fact Sheet for Patients:  EntrepreneurPulse.com.au  Fact Sheet for Healthcare Providers:  IncredibleEmployment.be  This test is no t yet approved or cleared by the [place] FDA and  has been authorized for detection and/or diagnosis of SARS-CoV-2 by FDA under an Emergency Use Authorization (EUA). This EUA will remain  in effect (meaning this test can be used) for the duration of the COVID-19 declaration under Section 564(b)(1) of the Act, 21 U.S.C.section 360bbb-3(b)(1), unless the authorization is terminated  or revoked sooner.       Influenza A by PCR NEGATIVE NEGATIVE Final   Influenza B by PCR NEGATIVE NEGATIVE Final    Comment: (NOTE) The Xpert Xpress SARS-CoV-2/FLU/RSV plus assay is intended as an aid in the diagnosis of influenza from Nasopharyngeal swab specimens and should not be used as a sole basis for treatment. Nasal washings and aspirates are unacceptable for Xpert Xpress SARS-CoV-2/FLU/RSV testing.  Fact Sheet for Patients: EntrepreneurPulse.com.au  Fact Sheet for Healthcare Providers: IncredibleEmployment.be  This test is not yet approved or cleared by the [place] FDA and has been authorized for detection and/or diagnosis of SARS-CoV-2 by FDA under an Emergency Use Authorization (EUA). This EUA will remain in effect (meaning this test can be used) for the duration of the COVID-19 declaration under Section 564(b)(1) of the Act, 21 U.S.C. section 360bbb-3(b)(1), unless the authorization is terminated or revoked.  Performed at [facility], [address] [place], [place] [address]     Coagulation Studies: No results for input(s): LABPROT, INR in the last 72 hours.  Urinalysis: No results for input(s): COLORURINE, LABSPEC, PHURINE, GLUCOSEU, HGBUR,  BILIRUBINUR, KETONESUR, PROTEINUR, UROBILINOGEN, NITRITE, LEUKOCYTESUR in the last 72 hours.  Invalid input(s): APPERANCEUR    Imaging: DG Chest 2 View  Result Date: 04/12/2021 CLINICAL DATA:  Short of breath EXAM: CHEST - 2 VIEW COMPARISON:  09/23/2017 FINDINGS: Elevated right hemidiaphragm with colonic gas below the right hemidiaphragm. This has improved from the prior study. There is mild right lower lobe airspace disease. Likely atelectasis. Heart size is normal. Coronary artery calcification and aortic arch calcification. Negative for heart failure. Left lung is clear. No effusion. IMPRESSION: Elevated right hemidiaphragm with right lower lobe atelectasis. Remaining lungs clear. Electronically Signed   By: Franchot Gallo M.D.   On: 04/18/2021 11:36   CT Head Wo Contrast  Result Date: 04/05/2021 CLINICAL DATA:  Fall, head trauma. EXAM: CT HEAD WITHOUT CONTRAST TECHNIQUE: Contiguous  axial images were obtained from the base of the skull through the vertex without intravenous contrast. COMPARISON:  None. FINDINGS: Brain: Mild cerebral atrophy. Mild white matter hypodensity bilaterally. Negative for acute infarct, hemorrhage, mass. Vascular: Negative for hyperdense vessel Skull: Negative Sinuses/Orbits: Mucosal edema and bony thickening left sphenoid sinus. Remaining sinuses clear. No orbital lesion. Other: None IMPRESSION: No acute abnormality.  Mild atrophy and mild white matter ischemia. Electronically Signed   By: Franchot Gallo M.D.   On: 04/11/2021 11:55   CT CHEST WO CONTRAST  Result Date: 04/04/2021 CLINICAL DATA:  Pneumonia, hypoxia EXAM: CT CHEST WITHOUT CONTRAST TECHNIQUE: Multidetector CT imaging of the chest was performed following the standard protocol without IV contrast. COMPARISON:  CT abdomen pelvis, 07/25/2018 FINDINGS: Cardiovascular: Aortic atherosclerosis. Normal heart size. Extensive 3 vessel coronary artery calcifications and/or stents. No pericardial effusion. Mediastinum/Nodes:  No enlarged mediastinal, hilar, or axillary lymph nodes. Thyroid gland, trachea, and esophagus demonstrate no significant findings. Lungs/Pleura: Unchanged scarring of the right lung base with elevation of the right hemidiaphragm and pleural calcifications in the right lung base. Minimal centrilobular emphysema. No pleural effusion or pneumothorax. Upper Abdomen: No acute abnormality.  Gallstones in the gallbladder. Musculoskeletal: No chest wall mass or suspicious bone lesions identified. IMPRESSION: 1. Unchanged scarring of the right lung base with elevation of the right hemidiaphragm and pleural calcifications in the right lung base. Findings are consistent with sequelae of prior infection or inflammation. 2. No acute airspace opacity. 3. Minimal emphysema. 4. Coronary artery disease. 5. Cholelithiasis. Aortic Atherosclerosis (ICD10-I70.0) and Emphysema (ICD10-J43.9). Electronically Signed   By: Eddie Candle M.D.   On: 04/01/2021 14:49     Medications:   . sodium chloride    . sodium chloride 100 mL/hr at 04/01/21 0912  . cefTRIAXone (ROCEPHIN)  IV     . amLODipine  5 mg Oral Daily  . aspirin EC  81 mg Oral Daily  . [START ON 03/30/2021] cholecalciferol  1,000 Units Oral Once per day on Mon Thu  . clopidogrel  75 mg Oral Daily  . docusate sodium  100 mg Oral Daily  . enoxaparin (LOVENOX) injection  30 mg Subcutaneous Q24H  . feeding supplement  237 mL Oral BID  . latanoprost  1 drop Both Eyes QHS  . mometasone-formoterol  2 puff Inhalation BID  . sodium chloride flush  3 mL Intravenous Q12H  . timolol  1 drop Both Eyes BID  . vitamin B-12  1,000 mcg Oral Daily   sodium chloride, acetaminophen **OR** acetaminophen, guaiFENesin, ipratropium-albuterol, ondansetron **OR** ondansetron (ZOFRAN) IV, sodium chloride flush  Assessment/ Plan:  87 y.o. male with diabetes, hypertension, chronic kidney disease, coronary artery disease, ex-smoker   admitted on 04/08/2021 for Acute respiratory failure  (HCC) [J96.00] Hypoxia [R09.02] Acute respiratory failure with hypoxia (HCC) [J96.01]   #Acute kidney injury on Chronic kidney disease stage IIIa Patient has underlying chronic kidney disease.  Risk factors include hypertension, atherosclerosis, advanced age.  Baseline creatinine of 1.6, GFR 49 from February 2022 AKI likely secondary to volume depletion and ATN. Creatinine increasing, will monitor Renal ultrasound to evaluate obstruction Decreased UOP Recommend to maintain high blood pressure greater than [id number] systolic which is usual for patient. BP increased to 120/49. Appears decreased this morning.  Will continue to monitor  #Acute hyperkalemia Shifting agents and Lokelma given yesterday with potassium improvement to 5.0 Potassium today is 6.3 Will treat with hyperkalemia protocol Will follow closely    LOS: 0 Eirene Rather 6/5/20229:10 AM  [place], [place]

## 2021-04-02 NOTE — Progress Notes (Addendum)
MRI brain with no acute stroke. MRA head completed and reviewed-no acute abnormality to explain current clinical picture. No stroke. Extensive ICAD - already on DAPT. MRA neck pending - first study technically limited-being repeated under radiologist guidance. Can see left CEA since 2018 but full report to follow after the study completion. Plan as in the consult note from earlier in the day  Called and notified daughter and granddaughter who were at patient bedside. Will follow up EEG results tomorrow.  D/w Dr Posey Pronto  -- Amie Portland, MD Neurologist

## 2021-04-02 NOTE — Progress Notes (Signed)
Unable to collect urine sample due to Inadequate urine production. MD aware.

## 2021-04-02 NOTE — Progress Notes (Signed)
   04/02/21 1733  Assess: MEWS Score  Temp 97.6 F (36.4 C)  BP (!) 102/48  Pulse Rate 76  Resp 18  SpO2 95 %  O2 Device Nasal Cannula  O2 Flow Rate (L/min) 4 L/min  Assess: MEWS Score  MEWS Temp 0  MEWS Systolic 0  MEWS Pulse 0  MEWS RR 0  MEWS LOC 0  MEWS Score 0  MEWS Score Color Green  Assess: if the MEWS score is Yellow or Red  Were vital signs taken at a resting state? No  Focused Assessment No change from prior assessment  Early Detection of Sepsis Score *See Row Information* Medium  MEWS guidelines implemented *See Row Information* No, vital signs rechecked

## 2021-04-02 NOTE — Progress Notes (Signed)
   04/02/21 1631  Assess: MEWS Score  Temp (!) 97.5 F (36.4 C)  BP 98/60  Pulse Rate 91  Resp (!) 22  Level of Consciousness Alert  SpO2 100 %  O2 Device Nasal Cannula  O2 Flow Rate (L/min) 4 L/min  Assess: MEWS Score  MEWS Temp 0  MEWS Systolic 1  MEWS Pulse 0  MEWS RR 1  MEWS LOC 0  MEWS Score 2  MEWS Score Color Yellow  Assess: if the MEWS score is Yellow or Red  Were vital signs taken at a resting state? No  Focused Assessment No change from prior assessment  Early Detection of Sepsis Score *See Row Information* Medium  MEWS guidelines implemented *See Row Information* No, vital signs rechecked  Treat  Pain Scale Faces  Pain Score Asleep  Notify: Charge Nurse/RN  Name of Charge Nurse/RN Notified Alcario Drought RN  Date Charge Nurse/RN Notified 04/02/21  Time Charge Nurse/RN Notified 1635  Notify: Provider  Provider Name/Title Dr. Fritzi Mandes  Date Provider Notified 04/02/21  Time Provider Notified 1631  Notification Type Page  Notification Reason Other (Comment) (Elevating RR. Soft Reading BP)  Provider response No new orders  Date of Provider Response 04/02/21  Time of Provider Response 1635  Document  Patient Outcome Not stable and remains on department  Progress note created (see row info) Yes

## 2021-04-02 NOTE — Progress Notes (Signed)
Received call from patients' nurse stating there were 2 daughters at the bedside and they would like our permission to allow more family to visit today due to patients condition.  Primary care nurse informed them of our current hospital visitation policy restriction due to Covid. They asked to speak with the [facility]. This AC explained, that at this time, only 2 visitors are allowed per day, per [place], and that 2 other visitors could come tomorrow. She mentioned Dr.Patel had spoke with her about patient condition and visitation. This AC called Dr. Posey Pronto to discuss matter and we both met with daughters at bedside to further explain current policy: ( 2 visitors are allowed per day and they may change daily). In addition, we will allow 1 daughter to stay at night. We apologized and explained this is necessary due to Covid / infection prevention as we are having to do this in order to help keep our staff, patients and visitors safe. Dr. Posey Pronto went on to explain if code status changes and family wishes to make patient comfort care at some point, then we could allow more visitors. At this time family wishes patient to remain full code.

## 2021-04-02 NOTE — Progress Notes (Signed)
   04/02/21 [id number]  Provider Notification  Provider Name/Title Dr. Fritzi Mandes  Date Provider Notified 04/02/21  Time Provider Notified 1000  Notification Type Page  Notification Reason Critical result (potassium: 6.3)  Test performed and critical result K+ 6.3  Date Critical Result Received 04/02/21  Time Critical Result Received 0958  Provider response See new orders  Date of Provider Response 04/02/21  Time of Provider Response 1009

## 2021-04-02 NOTE — Progress Notes (Signed)
Discussed with dr Candiss Norse  Repeat K now is 5.9 Recommends check another K after 3 hours to see the trend Sugars 212. no coverage for now.

## 2021-04-02 NOTE — Progress Notes (Signed)
   04/02/21 2040  Clinical Encounter Type  Visited With Patient and family together  Visit Type Initial;Spiritual support;Social support  Referral From Nurse  Consult/Referral To [place] responded to a call for support. Met with patient's daughter. She did not request a chaplain but Samuel Bryan's condition has changed rapidly. Chaplain Burris established a relationship of care and provided reflective listening and compassoinate, supportive presence. Recommend closely following over next days.

## 2021-04-02 NOTE — Progress Notes (Signed)
PT Cancellation Note  Patient Details Name: Samuel Bryan. MRN: [id number] DOB: Sep 24, 1927   Cancelled Treatment:    Reason Eval/Treat Not Completed: Patient not medically ready PT orders received, chart reviewed. Pt noted to have critically high K+ (6.3) & exertional activity contraindicated at this time. Will f/u as able & as pt is medically appropriate to participate.  Lavone Nian, PT, DPT 04/02/21, 10:50 AM    [name] 04/02/2021, 10:50 AM

## 2021-04-02 NOTE — Progress Notes (Addendum)
Patient ID: Samuel Bryan., male   DOB: 1927-03-10, 87 y.o.   MRN: [id number]  RN reported BMP shows K 6.3. creat 2.8 with poor UOP D/w dr Candiss Norse. Treat with insulin + dextrose, IV sodium bicarb 100 meg x1 and then cont d5w with bicarb gtt. --d/w RN   12:00pm discussed with patient's granddaughter and daughter in the room along with [facility] cherill RN in the room overall prognosis which at present is poor. -MRI brain results discussed. Also discussed with family regarding additional blood work stent  PSA, CEA rule out possible cancer.  -- Family expressed desire to have more family visit in the room. AC and myself explained the hospital policy. Made an exception for daughter to stay overnight. -- Patient remains a full code. -- Family aware [facility] has declined to accept patient for transfer. -- All questions answered to my best ability  2:27 pm--noted renal USG results. Will start empirically give IV rocephin given noted urine debrie and possible cystitis . Pt unable to give any history at present. No fever and WBC normal

## 2021-04-02 NOTE — Progress Notes (Signed)
Pt BP declining. Sent secure chat to provider Kennon Holter, NP to notify and ask about changing orders. Will continue to monitor.

## 2021-04-02 NOTE — Progress Notes (Signed)
Bladder scan: 60 mL

## 2021-04-02 NOTE — Progress Notes (Signed)
Sent secure text to provider in reference to pts BP. Instructed to continue to watch.

## 2021-04-03 ENCOUNTER — Inpatient Hospital Stay: Payer: Medicare PPO

## 2021-04-03 ENCOUNTER — Encounter: Payer: Self-pay | Admitting: Vascular Surgery

## 2021-04-03 ENCOUNTER — Encounter: Admission: EM | Disposition: E | Payer: Self-pay | Source: Ambulatory Visit | Attending: Internal Medicine

## 2021-04-03 DIAGNOSIS — R0902 Hypoxemia: Secondary | ICD-10-CM | POA: Diagnosis not present

## 2021-04-03 DIAGNOSIS — G9340 Encephalopathy, unspecified: Secondary | ICD-10-CM

## 2021-04-03 DIAGNOSIS — J9601 Acute respiratory failure with hypoxia: Secondary | ICD-10-CM

## 2021-04-03 DIAGNOSIS — N179 Acute kidney failure, unspecified: Secondary | ICD-10-CM

## 2021-04-03 DIAGNOSIS — N189 Chronic kidney disease, unspecified: Secondary | ICD-10-CM

## 2021-04-03 HISTORY — PX: TEMPORARY DIALYSIS CATHETER: CATH118312

## 2021-04-03 LAB — URINALYSIS, COMPLETE (UACMP) WITH MICROSCOPIC
Bilirubin Urine: NEGATIVE
Glucose, UA: NEGATIVE mg/dL
Ketones, ur: NEGATIVE mg/dL
Leukocytes,Ua: NEGATIVE
Nitrite: NEGATIVE
Protein, ur: NEGATIVE mg/dL
Specific Gravity, Urine: 1.012 (ref 1.005–1.030)
pH: 5 (ref 5.0–8.0)

## 2021-04-03 LAB — BASIC METABOLIC PANEL
Anion gap: 8 (ref 5–15)
BUN: 70 mg/dL — ABNORMAL HIGH (ref 8–23)
CO2: 35 mmol/L — ABNORMAL HIGH (ref 22–32)
Calcium: 7.7 mg/dL — ABNORMAL LOW (ref 8.9–10.3)
Chloride: 100 mmol/L (ref 98–111)
Creatinine, Ser: 3.17 mg/dL — ABNORMAL HIGH (ref 0.61–1.24)
GFR, Estimated: 18 mL/min — ABNORMAL LOW (ref >60)
Glucose, Bld: 248 mg/dL — ABNORMAL HIGH (ref 70–99)
Potassium: 6.2 mmol/L — ABNORMAL HIGH (ref 3.5–5.1)
Sodium: 143 mmol/L (ref 135–145)

## 2021-04-03 LAB — PHOSPHOLIPIDS, SERUM: Phospholipids: 213 mg/dL (ref 150–250)

## 2021-04-03 LAB — BLOOD GAS, VENOUS
Acid-Base Excess: 12.1 mmol/L — ABNORMAL HIGH (ref 0.0–2.0)
Bicarbonate: 41.8 mmol/L — ABNORMAL HIGH (ref 20.0–28.0)
O2 Saturation: 48.2 %
Patient temperature: 37
pCO2, Ven: 83 mmHg (ref 44.0–60.0)
pH, Ven: 7.31 (ref 7.250–7.430)

## 2021-04-03 LAB — RENAL FUNCTION PANEL
Albumin: 2.7 g/dL — ABNORMAL LOW (ref 3.5–5.0)
Anion gap: 5 (ref 5–15)
BUN: 72 mg/dL — ABNORMAL HIGH (ref 8–23)
CO2: 34 mmol/L — ABNORMAL HIGH (ref 22–32)
Calcium: 7.8 mg/dL — ABNORMAL LOW (ref 8.9–10.3)
Chloride: 103 mmol/L (ref 98–111)
Creatinine, Ser: 3.3 mg/dL — ABNORMAL HIGH (ref 0.61–1.24)
GFR, Estimated: 17 mL/min — ABNORMAL LOW (ref >60)
Glucose, Bld: 179 mg/dL — ABNORMAL HIGH (ref 70–99)
Phosphorus: 3.5 mg/dL (ref 2.5–4.6)
Potassium: 5.4 mmol/L — ABNORMAL HIGH (ref 3.5–5.1)
Sodium: 142 mmol/L (ref 135–145)

## 2021-04-03 LAB — GLUCOSE, CAPILLARY
Glucose-Capillary: 173 mg/dL — ABNORMAL HIGH (ref 70–99)
Glucose-Capillary: 69 mg/dL — ABNORMAL LOW (ref 70–99)
Glucose-Capillary: 65 mg/dL — ABNORMAL LOW (ref 70–99)
Glucose-Capillary: 126 mg/dL — ABNORMAL HIGH (ref 70–99)
Glucose-Capillary: 126 mg/dL — ABNORMAL HIGH (ref 70–99)

## 2021-04-03 LAB — HEPATITIS B SURFACE ANTIBODY,QUALITATIVE: Hep B S Ab: NONREACTIVE

## 2021-04-03 LAB — MAGNESIUM: Magnesium: 2.5 mg/dL — ABNORMAL HIGH (ref 1.7–2.4)

## 2021-04-03 LAB — CEA: CEA: 3.8 ng/mL (ref 0.0–4.7)

## 2021-04-03 LAB — MRSA PCR SCREENING: MRSA by PCR: NEGATIVE

## 2021-04-03 LAB — HEPATITIS B SURFACE ANTIGEN: Hepatitis B Surface Ag: NONREACTIVE

## 2021-04-03 SURGERY — TEMPORARY DIALYSIS CATHETER
Anesthesia: LOCAL

## 2021-04-03 MED ORDER — NOREPINEPHRINE 4 MG/250ML-% IV SOLN
0.0000 ug/min | INTRAVENOUS | Status: DC
  Administered 2021-04-05: 40 ug/min via INTRAVENOUS
  Administered 2021-04-06: 2 ug/min via INTRAVENOUS
  Administered 2021-04-07: 6 ug/min via INTRAVENOUS
  Administered 2021-04-08: 4 ug/min via INTRAVENOUS
  Administered 2021-04-09: 15 ug/min via INTRAVENOUS
  Filled 2021-04-03 (×9): qty 250

## 2021-04-03 MED ORDER — LIDOCAINE HCL (PF) 1 % IJ SOLN
5.0000 mL | INTRAMUSCULAR | Status: DC | PRN
  Filled 2021-04-03: qty 5

## 2021-04-03 MED ORDER — LIDOCAINE-PRILOCAINE 2.5-2.5 % EX CREA
1.0000 "application " | TOPICAL_CREAM | CUTANEOUS | Status: DC | PRN
  Filled 2021-04-03: qty 5

## 2021-04-03 MED ORDER — SODIUM BICARBONATE 8.4 % IV SOLN
50.0000 meq | INTRAVENOUS | Status: AC
  Administered 2021-04-03: 50 meq via INTRAVENOUS
  Filled 2021-04-03: qty 50

## 2021-04-03 MED ORDER — NEPRO/CARBSTEADY PO LIQD
1000.0000 mL | ORAL | Status: DC
  Administered 2021-04-03: 1000 mL

## 2021-04-03 MED ORDER — INSULIN ASPART 100 UNIT/ML IJ SOLN
10.0000 [IU] | INTRAMUSCULAR | Status: AC
  Administered 2021-04-03: 10 [IU] via INTRAVENOUS
  Filled 2021-04-03: qty 1

## 2021-04-03 MED ORDER — INSULIN ASPART 100 UNIT/ML IJ SOLN: 10.0000 [IU] | INTRAMUSCULAR | Status: DC

## 2021-04-03 MED ORDER — HEPARIN SODIUM (PORCINE) 1000 UNIT/ML DIALYSIS
1000.0000 [IU] | INTRAMUSCULAR | Status: DC | PRN
  Filled 2021-04-03: qty 1

## 2021-04-03 MED ORDER — DEXTROSE 50 % IV SOLN
25.0000 g | Freq: Once | INTRAVENOUS | Status: AC
  Administered 2021-04-03: 25 g via INTRAVENOUS
  Filled 2021-04-03: qty 50

## 2021-04-03 MED ORDER — SODIUM CHLORIDE 0.9 % IV SOLN: 100.0000 mL | INTRAVENOUS | Status: DC | PRN

## 2021-04-03 MED ORDER — DEXTROSE 50 % IV SOLN
INTRAVENOUS | Status: AC
  Administered 2021-04-03: 50 mL via INTRAVENOUS
  Filled 2021-04-03: qty 50

## 2021-04-03 MED ORDER — SODIUM ZIRCONIUM CYCLOSILICATE 5 G PO PACK
10.0000 g | PACK | Freq: Once | ORAL | Status: AC
  Administered 2021-04-03: 10 g
  Filled 2021-04-03: qty 1
  Filled 2021-04-03: qty 2

## 2021-04-03 MED ORDER — NEPRO/CARBSTEADY PO LIQD: 1000.0000 mL | Freq: Two times a day (BID) | ORAL | Status: DC

## 2021-04-03 MED ORDER — SODIUM CHLORIDE 0.9 % IV SOLN: INTRAVENOUS | Status: DC

## 2021-04-03 MED ORDER — PENTAFLUOROPROP-TETRAFLUOROETH EX AERO
1.0000 "application " | INHALATION_SPRAY | CUTANEOUS | Status: DC | PRN
  Filled 2021-04-03: qty 30

## 2021-04-03 MED ORDER — ALTEPLASE 2 MG IJ SOLR
2.0000 mg | Freq: Once | INTRAMUSCULAR | Status: DC | PRN
  Filled 2021-04-03: qty 2

## 2021-04-03 MED ORDER — CHLORHEXIDINE GLUCONATE CLOTH 2 % EX PADS
6.0000 | MEDICATED_PAD | Freq: Every day | CUTANEOUS | Status: DC
  Administered 2021-04-04 – 2021-04-09 (×5): 6 via TOPICAL

## 2021-04-03 SURGICAL SUPPLY — 3 items
COVER PROBE U/S 5X48 (MISCELLANEOUS) ×2 IMPLANT
KIT DIALYSIS CATH TRI 30X13 (CATHETERS) ×2 IMPLANT
PACK ANGIOGRAPHY (CUSTOM PROCEDURE TRAY) ×2 IMPLANT

## 2021-04-03 NOTE — Progress Notes (Signed)
Received patient from [facility] , Patient alert , [facility]CS 10/15 (3,2,5). Patient in no cardiopulmonary distress, utilizing 2L of Oxygen. NG tube in place to right nare. Mittens in place bilaterally. [place] at bedside, awaiting transportation for dialysis.

## 2021-04-03 NOTE — Consult Note (Signed)
NAME:  Samuel Ficca., MRN:  [id number], DOB:  09-Apr-1927, LOS: 1 ADMISSION DATE:  04/02/2021, CONSULTATION DATE:  04/20/2021 REFERRING MD:  Dr. Posey Pronto, CHIEF COMPLAINT: Weakness  History of Present Illness:  87 year old male presenting to [facility] ED on 04/14/2021 from primary care provider's office for evaluation of hypoxia.  Per ED documentation the patient's daughter states he fell to his knees 3 days prior to his admission trying to get into a chair.  He has complained of having intermittent episodes of dizziness and lightheadedness.  His daughter also reported he has had a productive cough with clear phlegm, shortness of breath and has become increasingly weaker with poor oral intake.  Daughter took the patient to see his PCP where he was found to be hypoxic with an SPO2 of 88%. ED course: Per documentation the patient denied chest pain, diaphoresis, palpitations, headache, abdominal pain/nausea.  He denied changes in bowel habits, urinary symptoms or any focal deficits.   He did admit to subjective fevers, patient continued to have hypoxia on room air and was placed on 2 L nasal cannula. Initial vitals: T 98.5, RR 18, NSR 62, BP 112/55 & SPO2 88% on room air. Significant labs: VBG-7.31/83/48/41.8, hyperkalemia at 5.3, Cl-97, serum CO2 33, BUN/Cr- 30/1.59, neutropenia at 3.8, troponin 74, lactic 0.8 > 2.2 > 1.6.             CT head without contrast negative for intracranial abnormality & CT chest without contrast showed unchanged scarring of the right lung base with elevation of the right hemidiaphragm and pleural calcifications of the right lung base. TRH consulted for admission to observation status due to acute need for oxygen.  Hospital course: 04/01/2021-nephrology consulted in the setting of AKI on CKD 3a & hyperkalemia due to suspected ATN secondary to volume depletion.  Patient treated with IV fluids giving shifting agents for hyperkalemia. 04/02/2021-concerns for ongoing lethargy, neurology  consulted for additional work-up.  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  MRA head and neck revealed left carotid enterectomy and stable right carotid bifurcation without significant stenosis.  Chronic moderate to severe bilateral vertebral artery origin stenosis due to calcified clot but not significantly changed. 04/24/2021-decision made to proceed with hemodialysis, vascular placed HD cath.  While in dialysis before being hooked up to receive treatment patient's BP dropped to 69/36.  Plan for HD treatment aborted and patient transferred to ICU for peripheral vasopressors.  PCCM consulted due to vasopressor need. Pertinent  Medical History  CKD stage 3a PVD with mild claudication CAD Essential hypertension Diabetes mellitus type 2 Hyperlipidemia Renal artery atherosclerosis Hard of hearing Significant Hospital Events: Including procedures, antibiotic start and stop dates in addition to other pertinent events   . (See HPI for hospital course up to now ) . 04/24/2021 -admit to ICU for vasopressor administration, PCCM consulted  Interim History / Subjective:  Patient lying in bed lethargic, intermittently vocal with mild groans.  Patient son is bedside explains patient is normally able to perform ADLs independently, even mowing his own lawn and doing his own grocery shopping.  Patient is extremely hard of hearing per family report and is without his hearing aid. Son reports he believes his current state of altered mental status is slightly improved at this point from previous during this admission. Blood pressure upon arrival to ICU was 142/47 (73), will monitor patient closely and start vasopressors as needed. Objective   Blood pressure (!) 144/50, pulse  90, temperature 98.3 F (36.8 C), temperature source Axillary, resp. rate (!) 25, height '5\' 6"'$  (1.676 m), weight 59 kg, SpO2 (!) 87 %.        Intake/Output  Summary (Last 24 hours) at 04/13/2021 2254 Last data filed at 04/24/2021 1700 Gross per 24 hour  Intake 1116.04 ml  Output 100 ml  Net 1016.04 ml   Filed Weights   04/10/2021 1045  Weight: 59 kg    Examination: General: Adult male, critically ill, lying in bed, altered but NAD HEENT: MM pink/dry, anicteric, atraumatic, neck supple Neuro: Eyes open spontaneously, no tracking or verbal response to questions, unable to follow commands, PERRL +3, MAE-generalized weakness CV: s1s2 RRR,  on monitor, no r/m/g Pulm: Regular, non labored on 4 L [place], breath sounds diminished throughout GI: soft, rounded, non tender, bs x 4 GU: foley in place with clear yellow urine Skin:  Discoloration near sacrum- no rashes/lesions noted Extremities: warm/dry, pulses + 2 R/P, no edema noted  Labs/imaging that I have personally reviewed  (right click and "Reselect all SmartList Selections" daily)  Net: +567 mL (since admit +1.1 L) Na+/ K+: 143/6.2 BUN/Cr.: 70/ 3.17 Serum CO2/ AG: 35/ 8  Hgb: 13.2 WBC/ TMAX: 7.3/ afebrile  CXR 04/17/2021: Elevated right hemidiaphragm with right lower lobe atelectasis.   Renal ultrasound 04/02/2021: Kidneys are small and show increased renal echogenicity indicative of underlying medical renal disease, no obstruction noted MRI 04/02/2021: No acute intracranial abnormality, small chronic infarct in the inferior right occipital pole MRA head and neck 04/02/2021: Left carotid enterectomy with resolved high-grade proximal L ICA stenosis.  Stable right carotid bifurcation atherosclerosis without significant stenosis.  Chronic moderate to severe bilateral vertebral artery origin stenosis due to calcified plaque that has not significantly changed. Resolved Hospital Problem list     Assessment & Plan:  Acute kidney injury on chronic CKD stage IIIa Hyperkalemia Due to worsening kidney function and persistent hyperkalemia after multiple shifting measures including sodium bicarb IVP and continuous  bicarb drip > family consented for hemodialysis and HD cath placed by vascular.  Concern for shock due to significant hypotension prior to HD session initiating with transfer to ICU for possible vasopressor administration. -BP remained stable, continue to monitor, add Levophed drip PRN to maintain MAP > 65 -Nephrology following, plan for HD/CRRT on 04/04/2021 depending on vasopressor status - Lokelma x 1 administered - STAT BMP & Mag to f/u on electrolytes as patient won't be dialyzed until 04/04/21, add additional shifting measures PRN  Acute encephalopathy multifactorial in the setting of dehydration/poor p.o. intake and AKI on CKD stage IIIa CT head on admission negative for intracranial abnormality, additional work-up performed by neurology post admission due to concerns for possible CVA/seizures.  MRI & MRA negative for acute abnormality, EEG pending -Supportive care, son to stay bedside overnight due to AMS -Neurology consulted, appreciate input -Continue IVF & continuous TF started - f/u UA & UC, continue empiric ceftriaxone  Acute on chronic hypoxic & hypercapnic respiratory failure PMHx: COPD CXR negative for acute process, PCT negative/afebrile, COVID-19 & flu negative -Supplemental O2 PRN to maintain SPO2 > 90% -Dulera twice daily, bronchodilators PRN  Malnutrition in the setting of poor p.o. intake and acute encephalopathy Hypoalbuminemia Weight loss NG tube placed 04/21/2021 - Initiate continuous Nepro per tube feeding protocol - dietary consulted, appreciate input - Plan for SLP eval once mentation has improved  Type 2 Diabetes Mellitus - Monitor CBG Q 4 hours - holding SSI dosing currently - target range while  in ICU: 140-180 - follow ICU hyper/hypo-glycemia protocol   Best practice (right click and "Reselect all SmartList Selections" daily)  Diet:  Tube Feed  Pain/Anxiety/Delirium protocol (if indicated): No VAP protocol (if indicated): Not indicated DVT prophylaxis:  Subcutaneous Heparin GI prophylaxis: N/A Glucose control:  SSI No Central venous access:  Yes, and it is still needed Arterial line:  N/A Foley:  Yes, and it is still needed Mobility:  bed rest  PT consulted: Yes Last date of multidisciplinary goals of care discussion 04/02/2021 Code Status:  full code Disposition: Stepdown  Labs   CBC: Recent Labs  Lab 04/11/2021 1051 04/16/2021 1649 04/01/21 0532  WBC 3.8* 4.2 7.3  HGB 11.3* 11.7* 13.2  HCT 37.5* 39.9 45.3  MCV 100.5* 102.6* 103.9*  PLT 207 199 [id number]    Basic Metabolic Panel: Recent Labs  Lab 03/30/2021 1051 04/20/2021 1649 04/01/21 0532 04/01/21 0811 04/02/21 0913 04/02/21 1452 04/02/21 1930 04/06/2021 0505 04/05/2021 2133  NA 137  --  140  --  141  --   --  143 142  K 5.3*  --  6.0*   < > 6.3* 5.9* 6.2* 6.2* 5.4*  CL 97*  --  96*  --  102  --   --  100 103  CO2 33*  --  34*  --  28  --   --  35* 34*  GLUCOSE 139*  --  159*  --  99  --   --  248* 179*  BUN 30*  --  37*  --  60*  --   --  70* 72*  CREATININE 1.59* 1.52* 1.70*  --  2.81*  --   --  3.17* 3.30*  CALCIUM 9.2  --  8.8*  --  8.1*  --   --  7.7* 7.8*  MG  --  2.3  --   --   --   --   --   --  2.5*  PHOS  --   --   --   --   --   --   --   --  3.5   < > = values in this interval not displayed.   GFR: Estimated Creatinine Clearance: 11.9 mL/min (A) (by C-G formula based on SCr of 3.3 mg/dL (H)). Recent Labs  Lab 04/22/2021 1051 04/14/2021 1135 04/26/2021 1506 04/04/2021 1649 04/01/21 0532 04/01/21 1126  PROCALCITON  --   --   --   --   --  <0.10  WBC 3.8*  --   --  4.2 7.3  --   LATICACIDVEN  --  0.8 2.2*  --   --  1.6    Liver Function Tests: Recent Labs  Lab 04/10/2021 1051 04/05/2021 2133  AST 17  --   ALT 11  --   ALKPHOS 49  --   BILITOT 0.7  --   PROT 7.3  --   ALBUMIN 3.6 2.7*   No results for input(s): LIPASE, AMYLASE in the last 168 hours. Recent Labs  Lab 04/02/21 0919  AMMONIA 27    ABG    Component Value Date/Time   HCO3 41.8 (H)  04/13/2021 1135   O2SAT 48.2 04/24/2021 1135     Coagulation Profile: No results for input(s): INR, PROTIME in the last 168 hours.  Cardiac Enzymes: No results for input(s): CKTOTAL, CKMB, CKMBINDEX, TROPONINI in the last 168 hours.  HbA1C: Hemoglobin A1C  Date/Time Value Ref Range Status  12/19/2020 09:25 AM 5.9 (A) 4.0 -  5.6 % Final   HbA1c, POC (controlled diabetic range)  Date/Time Value Ref Range Status  09/18/2019 09:44 AM 6.0 0.0 - 7.0 % Final   Hgb A1c MFr Bld  Date/Time Value Ref Range Status  03/18/2020 09:51 AM 6.0 (H) <5.7 % of total Hgb Final    Comment:    For someone without known diabetes, a hemoglobin  A1c value between 5.7% and 6.4% is consistent with prediabetes and should be confirmed with a  follow-up test. . For someone with known diabetes, a value <7% indicates that their diabetes is well controlled. A1c targets should be individualized based on duration of diabetes, age, comorbid conditions, and other considerations. . This assay result is consistent with an increased risk of diabetes. . Currently, no consensus exists regarding use of hemoglobin A1c for diagnosis of diabetes for children. .   08/21/2018 09:30 AM 6.5 (H) <5.7 % of total Hgb Final    Comment:    For someone without known diabetes, a hemoglobin A1c value of 6.5% or greater indicates that they may have  diabetes and this should be confirmed with a follow-up  test. . For someone with known diabetes, a value <7% indicates  that their diabetes is well controlled and a value  greater than or equal to 7% indicates suboptimal  control. A1c targets should be individualized based on  duration of diabetes, age, comorbid conditions, and  other considerations. . Currently, no consensus exists regarding use of hemoglobin A1c for diagnosis of diabetes for children. .     CBG: Recent Labs  Lab 04/24/2021 0753 04/12/2021 1409 04/25/2021 1644 04/14/2021 1859 04/23/2021 2057  GLUCAP 173*  69* 65* 126* 126*    Review of Systems:   Patient altered, unable to participate in care at this time.  Past Medical History:  He,  has a past medical history of Anemia, Atherosclerosis of renal artery ([place]), Essential hypertension, benign, Occlusion and stenosis of carotid artery without mention of cerebral infarction, Peripheral vascular disease, unspecified ([place]), Pure hypercholesterolemia, Renal insufficiency, and Type 2 diabetes mellitus ([place]).   Surgical History:   Past Surgical History:  Procedure Laterality Date  . APPENDECTOMY  1970's  . COLONOSCOPY    . coronary artery stenting  1999   at [facility]   . ENDARTERECTOMY Left 12/18/2017   Procedure: ENDARTERECTOMY CAROTID WITH PATCH;  Surgeon: Delana Meyer Dolores Lory, MD;  Location: [facility] ORS;  Service: Vascular;  Laterality: Left;  . FACIAL LACERATION REPAIR Left 12/13/2015   performed at [facility] by Dr. Marcial Pacas  . renal angiography    . TEMPORARY DIALYSIS CATHETER N/A 04/20/2021   Procedure: TEMPORARY DIALYSIS CATHETER;  Surgeon: Algernon Huxley, MD;  Location: [place] CV LAB;  Service: Cardiovascular;  Laterality: N/A;  . VISCERAL ANGIOGRAPHY N/A 08/19/2018   Procedure: VISCERAL ANGIOGRAPHY;  Surgeon: Katha Cabal, MD;  Location: [place] CV LAB;  Service: Cardiovascular;  Laterality: N/A;     Social History:   reports that he quit smoking about 50 years ago. His smoking use included cigarettes. He has never used smokeless tobacco. He reports that he does not drink alcohol and does not use drugs.   Family History:  His family history includes Diabetes in his brother and mother; Heart disease in his mother; Hyperlipidemia in his brother; Hypertension in his brother, son, and son. There is no history of Birth defects or Stroke.   Allergies No Known Allergies   Home Medications  Prior to Admission medications   Medication Sig Start  Date End Date Taking? Authorizing Provider  aspirin EC 81 MG tablet Take 81 mg by mouth  daily.    Yes [provider]  cholecalciferol (VITAMIN D3) 25 MCG (1000 UNIT) tablet Take 1,000 Units by mouth 2 (two) times a week.   Yes [provider]  clopidogrel (PLAVIX) 75 MG tablet TAKE 1 TABLET DAILY. Patient taking differently: Take 75 mg by mouth daily. 09/07/19  Yes Schnier, Dolores Lory, MD  docusate sodium (COLACE) 100 MG capsule Take 100 mg by mouth daily.   Yes [provider]  furosemide (LASIX) 40 MG tablet Take 20 mg by mouth daily. 07/10/16  Yes [provider]  guaifenesin (HUMIBID E) 400 MG TABS tablet Take 400 mg by mouth every other day.   Yes [provider]  latanoprost (XALATAN) 0.005 % ophthalmic solution Place 1 drop into both eyes at bedtime.   Yes [provider]  linagliptin (TRADJENTA) 5 MG TABS tablet Take 1 tablet (5 mg total) by mouth daily. For diabetes 03/18/20  Yes Delsa Grana, PA-C  losartan (COZAAR) 100 MG tablet Take 50 mg by mouth daily. 07/10/16  Yes [provider]  Nutritional Supplements (ENSURE ACTIVE HIGH PROTEIN) LIQD Take 1 Can by mouth in the morning and at bedtime. 04/21/20  Yes Delsa Grana, PA-C  timolol (BETIMOL) 0.5 % ophthalmic solution Place 1 drop into both eyes 2 (two) times daily. 11/11/15  Yes Ashok Norris, MD  vitamin B-12 (CYANOCOBALAMIN) 1000 MCG tablet Take 1 tablet (1,000 mcg total) by mouth daily. Patient taking differently: Take 5,000 mcg by mouth daily. 12/19/20  Yes Delsa Grana, PA-C     Critical care time: 40 minutes       Venetia Night, AGACNP-BC Acute Care Nurse Practitioner [facility]   [phone] / [phone] Please see Amion for pager details.

## 2021-04-03 NOTE — Progress Notes (Addendum)
Patient ID: Samuel Bryan., male   DOB: [birth year], 87 y.o.   MRN: [id number]  RN tried to placed NG tube at bedside. Patient is mountable to follow any commands hands not able to swallow. Difficult to place Angie at bedside. X-ray showed tube calling in the esophagus. Removed.  Discussed with IR Dr. Dwaine Gale to see if we can get NG tube placed under fluoro. Dr. Remus Loffler will try to find spot for patient in the IR suite when possible. It may be today it may be tomorrow.  Per family request Dr Clayborn Bigness informed of pt in the hospital

## 2021-04-03 NOTE — Progress Notes (Signed)
Pt seen in bed at GCS 6/10, Pt's vital signs done BP noted to be low MAP at recent potassium at 6.3, no urine output all day. Yellow mews fired.  On call physician contacted see new orders. Rapid response was called ICU nurse at bedside. Nil further change in management. Observation continues.

## 2021-04-03 NOTE — Progress Notes (Signed)
Initial Nutrition Assessment  DOCUMENTATION CODES:  Not applicable  INTERVENTION:   Pt at high risk for refeeding, monitor Mg and Phosphorus labs x 3 days to monitor for signs of refeeding and replete if needed  Once EN access is available, recommend the following:  Nepro TF, start at 29m/h advance by 170mq12h to goal of 4057m (960m17m formula total/d).  Free Water: 30mL26msh q4h  TF regimen will provide 1699kcal, 78g of protein, and 878mL 35mater (TF+flush)  NUTRITION DIAGNOSIS:  Inadequate oral intake related to inability to eat,lethargy/confusion as evidenced by NPO status.  GOAL:  Patient will meet greater than or equal to 90% of their needs  MONITOR:  TF tolerance,Labs,I & O's  REASON FOR ASSESSMENT:  Consult Assessment of nutrition requirement/status,Enteral/tube feeding initiation and management  ASSESSMENT:  Pt sent to ED from PCP office. Family reports that pt fell 3 days ago and has been complaining of right leg weakness. Has now developed SOB. In ED, found to be hypoxic. PMH relevant for HTN, PVD, CAD, HLD, DM type 2, CKD3.  Pt with poor mental status since admission. Pt was made NPO 6/4 and unable to be evaluated by SLP at this time due to mental status. Discussed with MD, reports that family wishes for pt to remain full code and all aggressive interventions be performed. Neurology workup completed and negative for stroke or other acute processes. AMS thought to be related to poor renal function. Pt to begin HD, vascular surgery consulted for placement of temporary dialysis catheter, which will be done today. NGT attempted but unsuccessful at bedside. IR to place under fluoroscopy either later today or tomorrow.    Pt undergoing procedure at the time of assessment, unable to enter room. Will place TF recommendations above to be entered whenever pt has enteral access. K currently high, will place on renal formula at this time and monitor lab trends once HD  initiated to determine if low K formulation is still warranted. Due to poor intake this admission and PTA, pt is at high risk for refeeding.   Nutritionally Relevant Medications: Scheduled Meds: . docusate sodium  100 mg Oral Daily   Continuous Infusions: . sodium chloride 100 mL/hr at 04/25/2021 0612   PRN Meds: ondansetron  Labs reviewed:  K 6.2  Creatinine 3.17, BUN 70  SBG ranges from 87-265 mg/dL over the last 24 hours  HgbA1c 5.9 (2/21)  NUTRITION - FOCUSED PHYSICAL EXAM: Defer, pt unavailable.   Diet Order:   Diet Order            Diet NPO time specified  Diet effective now                EDUCATION NEEDS:  No education needs have been identified at this time  Skin:  Skin Assessment: Reviewed RN Assessment  Last BM:  6/4 - type 5, per RN documentation  Height:  Ht Readings from Last 1 Encounters:  04/22/2021 '5\' 6"'$  (1.676 m)    Weight:  Wt Readings from Last 1 Encounters:  04/02/2021 59 kg    Ideal Body Weight:  64.5 kg  BMI:  Body mass index is 20.98 kg/m.  Estimated Nutritional Needs:   Kcal:  1500-1700 kcal/d  Protein:  75-90 g/d  Fluid:  1.5L/d   RachelRanell PatrickLDN Clinical Dietitian Pager on [place]

## 2021-04-03 NOTE — Procedures (Signed)
Pt in [place] -vascular surgery at this time

## 2021-04-03 NOTE — Progress Notes (Signed)
[facility] Progress Note Patient Name: Samuel Bryan. DOB: [birth year] MRN: [id number]   Date of Service  04/23/2021  HPI/Events of Note  87 yr old male transfrred to ICU from floor for hypotension while on HD. Admitted on 5 th for Encephalopathy/acute on CKD/AHRF. Remote prostate cancer. Got HD cath.  Data: Reviewed  Camera: Just rolled in. MAP 77, sats 88%, on nasal o2. HR 94%. Has NG tube. frail man   eICU Interventions  - continue current care - Levophed gtt for to keep MAP > 65 - aspiration/fall/sz precautions - EEG report pending, low suspicion as per Neurology - follow labs in AM for hyperkalemia. - HD as per nephro - VTE: sq heparin - CBG goals< 180 - on nebs, abx.     Intervention Category Major Interventions: Respiratory failure - evaluation and management Evaluation Type: New Patient Evaluation  Elmer Sow 04/18/2021, 9:13 PM

## 2021-04-03 NOTE — Progress Notes (Signed)
Pt unable to have HD tx due to hypotension. Last bp in HD unit was 83/18 (34). Multiple attempts to obtain bp on different extremities. Pt is restless, wearing mitt restraints, hanging legs off of bed and moaning loudly. Guy Sandifer MD notified and he contacted hospitalist to move pt to ICU. Pt will be moved to ICU, bp stabilized and will attempt HD after that. Report to primary RN and CCMD notified.

## 2021-04-03 NOTE — Progress Notes (Signed)
HD, see progress note

## 2021-04-03 NOTE — Progress Notes (Signed)
BP 69/36 prior to dialysis. Dialysis was not started. Discussed with hospitalist team. Patient to be transferred to icu for stabilisation

## 2021-04-03 NOTE — Progress Notes (Signed)
Patient left unit for dialysis.

## 2021-04-03 NOTE — Plan of Care (Signed)

## 2021-04-03 NOTE — Consult Note (Signed)
[facility] Vascular Consult Note  MRN : [id number]  Samuel Bryan. is a 87 y.o. ([birth year]) male who presents with chief complaint of  Chief Complaint  Patient presents with  . Fall  . Shortness of Breath   History of Present Illness:  Samuel Bryan. is a 87 year old male with medical history significant for hypertension, diabetes mellitus with stage III chronic kidney disease, coronary artery disease, ex-smoker who was sent to the emergency room from his primary care provider's office for evaluation of hypoxia.  Patient is admitted to the hospital with hypoxia and has developed acute on chronic renal failure. The nephrology service has decided to initiate dialysis at this time, and we are asked to place a temporary dialysis catheter for immediate dialysis use.    Vascular surgery was consulted by NP breeze for placement of a temporary dialysis catheter.  Current Facility-Administered Medications  Medication Dose Route Frequency Provider Last Rate Last Admin  . 0.9 %  sodium chloride infusion  250 mL Intravenous PRN Agbata, Tochukwu, MD      . 0.9 %  sodium chloride infusion   Intravenous Continuous Dwyane Dee, MD 100 mL/hr at 04/11/2021 0612 Infusion Verify at 04/11/2021 [id number]  . acetaminophen (TYLENOL) tablet 650 mg  650 mg Oral Q6H PRN Agbata, Tochukwu, MD       Or  . acetaminophen (TYLENOL) suppository 650 mg  650 mg Rectal Q6H PRN Agbata, Tochukwu, MD      . aspirin EC tablet 81 mg  81 mg Oral Daily Agbata, Tochukwu, MD   81 mg at 04/01/21 0827  . cefTRIAXone (ROCEPHIN) 1 g in sodium chloride 0.9 % 100 mL IVPB  1 g Intravenous Q24H Fritzi Mandes, MD   Stopped at 04/02/21 1606  . docusate sodium (COLACE) capsule 100 mg  100 mg Oral Daily Agbata, Tochukwu, MD   100 mg at 04/01/21 0827  . guaiFENesin (ROBITUSSIN) 100 MG/5ML solution 200 mg  10 mL Oral Q6H PRN Blount, Xenia T, NP      . heparin injection 5,000 Units  5,000 Units Subcutaneous Q8H Fritzi Mandes, MD   5,000 Units at 04/08/2021 0602  . ipratropium-albuterol (DUONEB) 0.5-2.5 (3) MG/3ML nebulizer solution 3 mL  3 mL Nebulization Q6H PRN Agbata, Tochukwu, MD      . latanoprost (XALATAN) 0.005 % ophthalmic solution 1 drop  1 drop Both Eyes QHS Agbata, Tochukwu, MD   1 drop at 04/02/21 2155  . mometasone-formoterol (DULERA) 200-5 MCG/ACT inhaler 2 puff  2 puff Inhalation BID Agbata, Tochukwu, MD   2 puff at 04/01/21 0828  . ondansetron (ZOFRAN) tablet 4 mg  4 mg Oral Q6H PRN Agbata, Tochukwu, MD       Or  . ondansetron (ZOFRAN) injection 4 mg  4 mg Intravenous Q6H PRN Agbata, Tochukwu, MD      . sodium chloride flush (NS) 0.9 % injection 3 mL  3 mL Intravenous Q12H Agbata, Tochukwu, MD   3 mL at 03/29/2021 0814  . sodium chloride flush (NS) 0.9 % injection 3 mL  3 mL Intravenous PRN Agbata, Tochukwu, MD      . timolol (TIMOPTIC) 0.5 % ophthalmic solution 1 drop  1 drop Both Eyes BID Agbata, Tochukwu, MD   1 drop at 04/02/21 2155   Past Medical History:  Diagnosis Date  . Anemia   . Atherosclerosis of renal artery ([place])   . Essential hypertension, benign   . Occlusion and stenosis of carotid artery without  mention of cerebral infarction    moderate left ICA stenosis  . Peripheral vascular disease, unspecified ([place])    mild lifestyle limiting claudication  . Pure hypercholesterolemia   . Renal insufficiency   . Type 2 diabetes mellitus ([place])    Past Surgical History:  Procedure Laterality Date  . APPENDECTOMY  1970's  . COLONOSCOPY    . coronary artery stenting  1999   at [facility]   . ENDARTERECTOMY Left 12/18/2017   Procedure: ENDARTERECTOMY CAROTID WITH PATCH;  Surgeon: Samuel Meyer Dolores Lory, MD;  Location: [facility] ORS;  Service: Vascular;  Laterality: Left;  . FACIAL LACERATION REPAIR Left 12/13/2015   performed at [facility] by Dr. Marcial Pacas  . renal angiography    . VISCERAL ANGIOGRAPHY N/A 08/19/2018   Procedure: VISCERAL ANGIOGRAPHY;  Surgeon: Katha Cabal, MD;  Location: [facility];  Service: Cardiovascular;  Laterality: N/A;   Social History Social History   Tobacco Use  . Smoking status: Former Smoker    Types: Cigarettes    Quit date: 10/29/1970    Years since quitting: 50.4  . Smokeless tobacco: Never Used  Vaping Use  . Vaping Use: Never used  Substance Use Topics  . Alcohol use: No    Alcohol/week: 0.0 standard drinks  . Drug use: No   Family History Family History  Problem Relation Age of Onset  . Diabetes Mother   . Heart disease Mother   . Hypertension Brother   . Hyperlipidemia Brother   . Diabetes Brother   . Hypertension Son   . Hypertension Son   . Birth defects Neg Hx   . Stroke Neg Hx   Denies family history of peripheral artery disease, venous disease or renal disease.  No Known Allergies  REVIEW OF SYSTEMS (Negative unless checked)  Constitutional: '[]'$ Weight loss  '[]'$ Fever  '[]'$ Chills Cardiac: '[]'$ Chest pain   '[]'$ Chest pressure   '[]'$ Palpitations   '[]'$ Shortness of breath when laying flat   '[]'$ Shortness of breath at rest   '[x]'$ Shortness of breath with exertion. Vascular:  '[]'$ Pain in legs with walking   '[]'$ Pain in legs at rest   '[]'$ Pain in legs when laying flat   '[]'$ Claudication   '[]'$ Pain in feet when walking  '[]'$ Pain in feet at rest  '[]'$ Pain in feet when laying flat   '[]'$ History of DVT   '[]'$ Phlebitis   '[]'$ Swelling in legs   '[]'$ Varicose veins   '[]'$ Non-healing ulcers Pulmonary:   '[]'$ Uses home oxygen   '[]'$ Productive cough   '[]'$ Hemoptysis   '[]'$ Wheeze  '[]'$ COPD   '[]'$ Asthma Neurologic:  '[]'$ Dizziness  '[]'$ Blackouts   '[]'$ Seizures   '[]'$ History of stroke   '[]'$ History of TIA  '[]'$ Aphasia   '[]'$ Temporary blindness   '[]'$ Dysphagia   '[]'$ Weakness or numbness in arms   '[]'$ Weakness or numbness in legs Musculoskeletal:  '[x]'$ Arthritis   '[]'$ Joint swelling   '[]'$ Joint pain   '[]'$ Low back pain Hematologic:  '[]'$ Easy bruising  '[]'$ Easy bleeding   '[]'$ Hypercoagulable state   '[]'$ Anemic  '[]'$ Hepatitis Gastrointestinal:  '[]'$ Blood in stool   '[]'$ Vomiting blood  '[]'$ Gastroesophageal reflux/heartburn   '[]'$ Difficulty  swallowing. Genitourinary:  '[x]'$ Chronic kidney disease   '[]'$ Difficult urination  '[]'$ Frequent urination  '[]'$ Burning with urination   '[]'$ Blood in urine Skin:  '[]'$ Rashes   '[]'$ Ulcers   '[]'$ Wounds Psychological:  '[]'$ History of anxiety   '[]'$  History of major depression.  Physical Examination  Vitals:   04/08/2021 0114 04/23/2021 0353 04/14/2021 0555 04/02/2021 0900  BP: (!) 98/39 (!) 95/39 (!) 103/40 105/84  Pulse: 73 74 72 80  Resp:  $'20 20 14 16  'o$ Temp: 97.8 F (36.6 C) (!) 97.4 F (36.3 C) 97.7 F (36.5 C) 97.6 F (36.4 C)  TempSrc: Oral Oral Oral   SpO2:  100% 100% 100%  Weight:      Height:       Body mass index is 20.98 kg/m. Gen: WD/WN Head: [place]/AT, No temporalis wasting Ear/Nose/Throat: Hearing grossly intact, nares w/o erythema or drainage Eyes: Sclera non-icteric, conjunctiva clear Neck: Supple, no nuchal rigidity.  No JVD.  Pulmonary:  Good air movement, clear to auscultation bilaterally.  Cardiac: RRR, normal S1, S2, no Murmurs, rubs or gallops. Vascular: Warm distally in toes.  Good capillary refill. Gastrointestinal: soft, non-tender/non-distended. No guarding/reflex.  Musculoskeletal: M/S 5/5 throughout.  Extremities without ischemic changes.  No deformity or atrophy.  Mild to moderate edema in the lower extremities bilaterally Neurologic: Very lethargic.  Hard to assess. Psychiatric: Difficult to assess due to the severity of patient's illness. Dermatologic: No rashes or ulcers noted.   Lymph : No Cervical, Axillary, or Inguinal lymphadenopathy.  CBC Lab Results  Component Value Date   WBC 7.3 04/01/2021   HGB 13.2 04/01/2021   HCT 45.3 04/01/2021   MCV 103.9 (H) 04/01/2021   PLT 237 04/01/2021   BMET    Component Value Date/Time   NA 143 04/23/2021 0505   NA 137 04/09/2016 1008   NA 137 04/26/2012 1145   K 6.2 (H) 04/11/2021 0505   K 4.0 04/26/2012 1145   CL 100 04/26/2021 0505   CL 104 04/26/2012 1145   CO2 35 (H) 04/06/2021 0505   CO2 29 04/26/2012 1145   GLUCOSE 248  (H) 04/23/2021 0505   GLUCOSE 105 (H) 04/26/2012 1145   BUN 70 (H) 04/22/2021 0505   BUN 19 04/09/2016 1008   BUN 16 04/26/2012 1145   CREATININE 3.17 (H) 03/30/2021 0505   CREATININE 1.65 (H) 12/06/2020 1028   CALCIUM 7.7 (L) 04/07/2021 0505   CALCIUM 8.9 04/26/2012 1145   GFRNONAA 18 (L) 04/07/2021 0505   GFRNONAA 36 (L) 12/06/2020 1028   GFRAA 41 (L) 12/06/2020 1028   Estimated Creatinine Clearance: 12.4 mL/min (A) (by C-G formula based on SCr of 3.17 mg/dL (H)).  COAG Lab Results  Component Value Date   INR 1.06 12/10/2017   Radiology DG Chest 2 View  Result Date: 04/18/2021 CLINICAL DATA:  Short of breath EXAM: CHEST - 2 VIEW COMPARISON:  09/23/2017 FINDINGS: Elevated right hemidiaphragm with colonic gas below the right hemidiaphragm. This has improved from the prior study. There is mild right lower lobe airspace disease. Likely atelectasis. Heart size is normal. Coronary artery calcification and aortic arch calcification. Negative for heart failure. Left lung is clear. No effusion. IMPRESSION: Elevated right hemidiaphragm with right lower lobe atelectasis. Remaining lungs clear. Electronically Signed   By: Franchot Gallo M.D.   On: 04/06/2021 11:36   CT Head Wo Contrast  Result Date: 04/02/2021 CLINICAL DATA:  Fall, head trauma. EXAM: CT HEAD WITHOUT CONTRAST TECHNIQUE: Contiguous axial images were obtained from the base of the skull through the vertex without intravenous contrast. COMPARISON:  None. FINDINGS: Brain: Mild cerebral atrophy. Mild white matter hypodensity bilaterally. Negative for acute infarct, hemorrhage, mass. Vascular: Negative for hyperdense vessel Skull: Negative Sinuses/Orbits: Mucosal edema and bony thickening left sphenoid sinus. Remaining sinuses clear. No orbital lesion. Other: None IMPRESSION: No acute abnormality.  Mild atrophy and mild white matter ischemia. Electronically Signed   By: Franchot Gallo M.D.   On: 04/05/2021 11:55  CT CHEST WO  CONTRAST  Result Date: 03/29/2021 CLINICAL DATA:  Pneumonia, hypoxia EXAM: CT CHEST WITHOUT CONTRAST TECHNIQUE: Multidetector CT imaging of the chest was performed following the standard protocol without IV contrast. COMPARISON:  CT abdomen pelvis, 07/25/2018 FINDINGS: Cardiovascular: Aortic atherosclerosis. Normal heart size. Extensive 3 vessel coronary artery calcifications and/or stents. No pericardial effusion. Mediastinum/Nodes: No enlarged mediastinal, hilar, or axillary lymph nodes. Thyroid gland, trachea, and esophagus demonstrate no significant findings. Lungs/Pleura: Unchanged scarring of the right lung base with elevation of the right hemidiaphragm and pleural calcifications in the right lung base. Minimal centrilobular emphysema. No pleural effusion or pneumothorax. Upper Abdomen: No acute abnormality.  Gallstones in the gallbladder. Musculoskeletal: No chest wall mass or suspicious bone lesions identified. IMPRESSION: 1. Unchanged scarring of the right lung base with elevation of the right hemidiaphragm and pleural calcifications in the right lung base. Findings are consistent with sequelae of prior infection or inflammation. 2. No acute airspace opacity. 3. Minimal emphysema. 4. Coronary artery disease. 5. Cholelithiasis. Aortic Atherosclerosis (ICD10-I70.0) and Emphysema (ICD10-J43.9). Electronically Signed   By: Eddie Candle M.D.   On: 04/16/2021 14:49   MR ANGIO HEAD WO CONTRAST  Result Date: 04/02/2021 CLINICAL DATA:  87 year old male with head trauma, fall. Altered mental status. Hypoxia. EXAM: MRA HEAD WITHOUT CONTRAST TECHNIQUE: Angiographic images of the Circle of Willis were acquired using MRA technique without intravenous contrast. COMPARISON:  Brain MRI today reported separately. FINDINGS: Antegrade flow in the posterior circulation with patent distal vertebral arteries and vertebrobasilar junction. Mild left V4 irregularity and stenosis. No distal right vertebral stenosis. Both PICA  origins are patent. Patent basilar artery without stenosis. SCA and PCA origins are patent. Posterior communicating arteries are diminutive or absent. Mild motion artifact at the level of the PCAs, which appear grossly normal. Antegrade flow in both ICA siphons. The left siphon is asymmetrically smaller. Motion artifact limits evaluation of the distal cavernous and anterior genu segments, but no definite hemodynamically significant siphon stenosis is identified. Carotid termini are patent. Right ACA A1 is dominant. Anterior communicating artery and visible ACA branches are patent with mild irregularity. Both MCA M1 segments and MCA bifurcations are patent with mild irregularity. No proximal MCA branch occlusion is identified. IMPRESSION: 1. Evidence of intracranial atherosclerosis but no definite hemodynamically significant stenosis. 2. Left ICA siphon appears mildly smaller than the right. See Neck MRA findings today reported separately. Electronically Signed   By: Genevie Ann M.D.   On: 04/02/2021 10:29   MR ANGIO NECK WO CONTRAST  Result Date: 04/02/2021 CLINICAL DATA:  87 year old male with head trauma, fall. Altered mental status. Hypoxia. EXAM: MRA NECK WITHOUT CONTRAST TECHNIQUE: Angiographic images of the neck were obtained using MRA technique without intravenous contrast. Carotid stenosis measurements (when applicable) are obtained utilizing NASCET criteria, using the distal internal carotid diameter as the denominator. COMPARISON:  Head MRI and intracranial MRA from the same day reported separately. CTA neck 06/17/2017. FINDINGS: 3D time-of-flight imaging in the neck. Three vessel arch configuration with antegrade flow signal in the bilateral cervical carotid and vertebral arteries. Mild right carotid bifurcation irregularity appears stable since the 2018 CTA with no significant stenosis. The right ICA remains patent to the skull base. Left carotid endarterectomy since 2018 with resolved high-grade proximal  left ICA stenosis. The left carotid bifurcation now appears within normal limits. Distal to the bulb the cervical left ICA is chronically smaller than the left, stable since 2018. Proximal subclavian arteries and vertebral artery origins are patent. Bilateral vertebral  origin calcified plaque and stenosis seen in 2018 probably has not significantly changed. The right vertebral is mildly dominant as before. And no additional vertebral artery stenosis is identified at the skull base. IMPRESSION: 1. Left carotid endarterectomy with resolved high-grade proximal left ICA stenosis since 2018. Chronically small left ICA caliber is stable. 2. Stable right carotid bifurcation atherosclerosis without significant stenosis. 3. Chronic moderate to severe bilateral vertebral artery origin stenosis due to calcified plaque has not significantly changed. Both vertebrals remain patent to the skull base, the right is dominant as before. Electronically Signed   By: Genevie Ann M.D.   On: 04/16/2021 08:07   MR BRAIN WO CONTRAST  Result Date: 04/02/2021 CLINICAL DATA:  87 year old male with head trauma, fall. Altered mental status. Hypoxia. EXAM: MRI HEAD WITHOUT CONTRAST TECHNIQUE: Multiplanar, multiecho pulse sequences of the brain and surrounding structures were obtained without intravenous contrast. COMPARISON:  Head CT 04/15/2021. CTA neck 06/17/2017. FINDINGS: MRI HEAD FINDINGS Brain: Cerebral volume is within normal limits for age. No restricted diffusion to suggest acute infarction. No midline shift, mass effect, evidence of mass lesion, ventriculomegaly, extra-axial collection or acute intracranial hemorrhage. Cervicomedullary junction and pituitary are within normal limits. Incidental adhesion right frontal horn, normal variant. Largely normal for age gray and white matter signal throughout the brain. There is a small area of chronic encephalomalacia in the inferior right occipital pole (series 8, image 17) with possible mild  hemosiderin. But no other encephalomalacia or chronic cerebral blood products identified. Deep gray matter nuclei, brainstem and cerebellum appear negative. Vascular: Major intracranial vascular flow voids are preserved. Skull and upper cervical spine: Negative for age visible cervical spine. Normal bone marrow signal. Sinuses/Orbits: Postoperative changes to the left globe, otherwise negative orbits. Mild chronic left sphenoid sinus disease as demonstrated by CT. Other: Mastoids are clear. Grossly normal visible internal auditory structures. Visible scalp and face appear negative. IMPRESSION: 1. No acute intracranial abnormality. 2. 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 Electronically Signed   By: Genevie Ann M.D.   On: 04/02/2021 10:26   US RENAL  Result Date: 04/02/2021 CLINICAL DATA:  Acute renal failure EXAM: RENAL / URINARY TRACT ULTRASOUND COMPLETE COMPARISON:  None. FINDINGS: Right Kidney: Renal measurements: 8.9 x 5.1 x 4.8 cm = volume: 112.9 mL. Echogenicity is increased. Renal cortical thickness is normal. No mass, perinephric fluid, or hydronephrosis visualized. No sonographically demonstrable calculus or ureterectasis. Left Kidney: Renal measurements: 9.3 x 0.2 x 4.9 cm = volume: 122.3 mL. Echogenicity is increased. Renal cortical thickness is normal. No mass, perinephric fluid, or hydronephrosis visualized. No sonographically demonstrable calculus or ureterectasis. Bladder: There is thickened urinary bladder wall with apparent debris posteriorly within the dependent portion of the bladder. Other: None. IMPRESSION: 1. Urinary bladder wall is thickened and contains debris. Suspect cystitis. 2. Kidneys are rather small and show increased renal echogenicity, findings indicative of a degree of underlying medical renal disease. Renal cortical thickness bilaterally is within normal limits. No obstructing focus in either  kidney. Electronically Signed   By: Lowella Grip III M.D.   On: 04/02/2021 13:15   Assessment/Plan [place] Cedar Nardiello. is a 87 year old male with medical history significant for hypertension, diabetes mellitus with stage III chronic kidney disease, coronary artery disease, ex-smoker who was sent to the emergency room from his primary care provider's office for evaluation of hypoxia.  1.  Acute on chronic renal failure:  We  will proceed with temporary dialysis catheter placement at this time. Risks and benefits discussed with patient and/or family, and the catheter will be placed to allow immediate initiation of dialysis.  If the patient's renal function does not improve throughout the hospital course, we will be happy to place a tunneled dialysis catheter for long term use prior to discharge.   2.  Hyperlipidemia: On aspirin and Plavix for medical management. ?  Addition of statin Encouraged good control as its slows the progression of atherosclerotic disease  Discussed with Dr. Mayme Genta, PA-C  03/30/2021 10:55 AM

## 2021-04-03 NOTE — Progress Notes (Signed)
eeg done °

## 2021-04-03 NOTE — Progress Notes (Signed)
Patient ID: Samuel Steelman., male   DOB: July 31, 1927, 87 y.o.   MRN: [id number]  Discussed at length by Dr. Candiss Norse with patient's homes and daughter in the morning. Discussion was held regarding starting dialysis given worsening kidney numbers and patient remains encephalopathic/lethargic. Family is agreeable with starting hemodialysis. Patient remains a full code. Aggressive care to be continued. Will placed NG tube and start to feeding when able to. Family understands patient is sick and at high risk for cardiorespiratory arrest. Discussed by Dr. Candiss Norse currently does not meet ICU care however if clinically deteriorates will transfer to ICU.

## 2021-04-03 NOTE — Progress Notes (Signed)
Dr Posey Pronto called the floor  informed about elevated potassium of 6.2 see new phone orders given. Pharmacy called to verify sodium bicarb order. Observation continues. Family educated

## 2021-04-03 NOTE — Progress Notes (Signed)
PT Cancellation Note  Patient Details Name: Samuel Bryan. MRN: [id number] DOB: [birth year]   Cancelled Treatment:    Reason Eval/Treat Not Completed: Medical issues which prohibited therapy. Consult received and chart reviewed. Patient noted with critically elevated K+ (6.2) , per PT practice guidelines contraindicated for exertional activity at this time.  Will continue efforts next date pending medical stability and appropriateness.  Lieutenant Diego PT, DPT 9:10 AM,04/15/2021

## 2021-04-03 NOTE — Progress Notes (Signed)
Patient ID: Samuel Buckels., male   DOB: 11-03-1926, 87 y.o.   MRN: [id number] Received call from Dr Candiss Norse. BP is low to start HD. Will transfer to ICU for IV pressors.  Dr Red Christians discussed with son Samuel Bryan. ICU NP Domingo Pulse informed and discussed case on the phone. Transfer orders placed.  ICU charge RN aware.

## 2021-04-03 NOTE — Progress Notes (Signed)
Pt  BP continues to be low, on call doctor informed plan for IVF bolus. Same administered. BP recheck after same no significant  Change noted in B,P nil urine output bladder scan at 178 . Doc is  Aware plan for N/S maintenance fluid at 100 ml/hr. Observation continues. Relative at bedside.

## 2021-04-03 NOTE — Progress Notes (Signed)
[facility] [place] [facility] [place] NAME: Samuel Bryan    MR#:  [id number]  DATE OF BIRTH:  1927-06-04  SUBJECTIVE:   Daughter [name] in the room Remains lethargic, HOH. Per dter winces to pain/IV stick Mouth breather No fever Soft BP this am UOP poor, bladder scan 175 cc sats 100% on 4 liters REVIEW OF SYSTEMS:   Review of Systems  Unable to perform ROS: Mental status change   Tolerating Diet:NPO Tolerating PT: pending MS change  DRUG ALLERGIES:  No Known Allergies  VITALS:  Blood pressure (!) 103/40, pulse 72, temperature 97.7 F (36.5 C), temperature source Oral, resp. rate 14, height '5\' 6"'$  (1.676 m), weight 59 kg, SpO2 100 %.  PHYSICAL EXAMINATION:   Physical Examlimited due to MS and pt participation  GENERAL:  87 y.o.-year-old patient lying in the bed with no acute distress.  Thin, fraile HEENT: Head atraumatic, normocephalic. Oropharynx and nasopharynx clear. Dry eye secretions. ?left facial drool LUNGS: Normal breath sounds bilaterally, no wheezing, rales, rhonchi. No use of accessory muscles of respiration.  CARDIOVASCULAR: S1, S2 normal. No murmurs, rubs, or gallops.  ABDOMEN: Soft, nontender, nondistended.  EXTREMITIES: No cyanosis, clubbing or edema b/l.    NEUROLOGIC: unable to perform due to MS change PSYCHIATRIC:  patient is lethargic  LABORATORY PANEL:  CBC Recent Labs  Lab 04/01/21 0532  WBC 7.3  HGB 13.2  HCT 45.3  PLT 237    Chemistries  Recent Labs  Lab 04/19/2021 1051 04/04/2021 1649 04/01/21 0532 04/06/2021 0505  NA 137  --    < > 143  K 5.3*  --    < > 6.2*  CL 97*  --    < > 100  CO2 33*  --    < > 35*  GLUCOSE 139*  --    < > 248*  BUN 30*  --    < > 70*  CREATININE 1.59* 1.52*   < > 3.17*  CALCIUM 9.2  --    < > 7.7*  MG  --  2.3  --   --   AST 17  --   --   --   ALT 11  --   --   --   ALKPHOS 49  --   --   --   BILITOT 0.7  --   --   --    < > = values in this interval not displayed.    Cardiac Enzymes No results for input(s): TROPONINI in the last 168 hours. RADIOLOGY:  MR ANGIO HEAD WO CONTRAST  Result Date: 04/02/2021 CLINICAL DATA:  87 year old male with head trauma, fall. Altered mental status. Hypoxia. EXAM: MRA HEAD WITHOUT CONTRAST TECHNIQUE: Angiographic images of the Circle of Willis were acquired using MRA technique without intravenous contrast. COMPARISON:  Brain MRI today reported separately. FINDINGS: Antegrade flow in the posterior circulation with patent distal vertebral arteries and vertebrobasilar junction. Mild left V4 irregularity and stenosis. No distal right vertebral stenosis. Both PICA origins are patent. Patent basilar artery without stenosis. SCA and PCA origins are patent. Posterior communicating arteries are diminutive or absent. Mild motion artifact at the level of the PCAs, which appear grossly normal. Antegrade flow in both ICA siphons. The left siphon is asymmetrically smaller. Motion artifact limits evaluation of the distal cavernous and anterior genu segments, but no definite hemodynamically significant siphon stenosis is identified. Carotid termini are patent. Right ACA A1 is dominant. Anterior communicating artery and visible ACA  branches are patent with mild irregularity. Both MCA M1 segments and MCA bifurcations are patent with mild irregularity. No proximal MCA branch occlusion is identified. IMPRESSION: 1. Evidence of intracranial atherosclerosis but no definite hemodynamically significant stenosis. 2. Left ICA siphon appears mildly smaller than the right. See Neck MRA findings today reported separately. Electronically Signed   By: Genevie Ann M.D.   On: 04/02/2021 10:29   MR BRAIN WO CONTRAST  Result Date: 04/02/2021 CLINICAL DATA:  87 year old male with head trauma, fall. Altered mental status. Hypoxia. EXAM: MRI HEAD WITHOUT CONTRAST TECHNIQUE: Multiplanar, multiecho pulse sequences of the brain and surrounding structures were obtained without  intravenous contrast. COMPARISON:  Head CT 04/11/2021. CTA neck 06/17/2017. FINDINGS: MRI HEAD FINDINGS Brain: Cerebral volume is within normal limits for age. No restricted diffusion to suggest acute infarction. No midline shift, mass effect, evidence of mass lesion, ventriculomegaly, extra-axial collection or acute intracranial hemorrhage. Cervicomedullary junction and pituitary are within normal limits. Incidental adhesion right frontal horn, normal variant. Largely normal for age gray and white matter signal throughout the brain. There is a small area of chronic encephalomalacia in the inferior right occipital pole (series 8, image 17) with possible mild hemosiderin. But no other encephalomalacia or chronic cerebral blood products identified. Deep gray matter nuclei, brainstem and cerebellum appear negative. Vascular: Major intracranial vascular flow voids are preserved. Skull and upper cervical spine: Negative for age visible cervical spine. Normal bone marrow signal. Sinuses/Orbits: Postoperative changes to the left globe, otherwise negative orbits. Mild chronic left sphenoid sinus disease as demonstrated by CT. Other: Mastoids are clear. Grossly normal visible internal auditory structures. Visible scalp and face appear negative. IMPRESSION: 1. No acute intracranial abnormality. 2. 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 Electronically Signed   By: Genevie Ann M.D.   On: 04/02/2021 10:26   US RENAL  Result Date: 04/02/2021 CLINICAL DATA:  Acute renal failure EXAM: RENAL / URINARY TRACT ULTRASOUND COMPLETE COMPARISON:  None. FINDINGS: Right Kidney: Renal measurements: 8.9 x 5.1 x 4.8 cm = volume: 112.9 mL. Echogenicity is increased. Renal cortical thickness is normal. No mass, perinephric fluid, or hydronephrosis visualized. No sonographically demonstrable calculus or ureterectasis. Left Kidney: Renal measurements: 9.3 x  0.2 x 4.9 cm = volume: 122.3 mL. Echogenicity is increased. Renal cortical thickness is normal. No mass, perinephric fluid, or hydronephrosis visualized. No sonographically demonstrable calculus or ureterectasis. Bladder: There is thickened urinary bladder wall with apparent debris posteriorly within the dependent portion of the bladder. Other: None. IMPRESSION: 1. Urinary bladder wall is thickened and contains debris. Suspect cystitis. 2. Kidneys are rather small and show increased renal echogenicity, findings indicative of a degree of underlying medical renal disease. Renal cortical thickness bilaterally is within normal limits. No obstructing focus in either kidney. Electronically Signed   By: Lowella Grip III M.D.   On: 04/02/2021 13:15   ASSESSMENT AND PLAN:  Samuel Bryanis a 87 y.o.malewith medical history significant forhypertension, diabetes mellitus with stage III chronic kidney disease, coronary artery disease, ex-smoker who was sent to the emergency room from his primary care provider's office for evaluation of hypoxia. His daughter states that he fell 3 days prior to his admission.He was trying to get into a chair when he fell on his knees.  Acute metabolic encephalopathy appears multifactorial suspect due to AKI on CKD IIIa -- suspect dehydration, poor PO intake, h/o recent fall, weight loss -- patient very hard  on hearing --CT head on admission--nothing acute except mild atrophy- -- 6/5-- remains lethargic despite IVF --Neurology consult--d/w Dr Rory Percy --MRI brain, MR Angio head and neck wo contrast---negative for stroke    --no source of infection so far --UA, UC pending --Empirically on IV rocephine  Acute on chronic CKD stage IV Hyperkalemia, acidosis -- baseline creatinine 1.5 -- came in with creatinine of 1.7 -- 2.81--3.17 -- treated  with insulin, dextrose and Kayexalate enema, bicarb IV push and bicarb gtt -- potassium was 6.3---5.0--5.5--6.2--Rx  given--5.9--6.3--rx given--6.3--rx ordered -- continue IV fluids--received bicarb gtt and NS -- nephrology consultation with Dr. Candiss Norse --poor UOP  Acute respiratory failure with transient hypoxia -- patient not in respiratory distress.sats 100% 4liters -- Patient has has history of COPD and emphysema in the past -- wean oxygen as tolerated -- chest x-ray negative for pneumonia -- Pro calcitonin negative, no fever, WBC normal, no evidence of infection/PNA on CT chest --COVID and Flu negative  Status post fall -- PT to see patient when appropriate  Hypertension -- soft bp --holding po meds  Poor PO intake and altered mental status with cough -- speech therapy to see patient once mter alert to follow instrctuions -- will keep patient NPO   Type II diabetes with CKD stage IV -- SSI for now  Malnutrition--moderate to severe Weight loss --Dietitian consulted -- b12 5630, Vit D 36, B1 pending --PSA 6.58 (elevated)--?CT abd vs skeletal survey --CEA pending  Palliative care to see patient for goals of care  D/w family--[facility] for [facility] was declined on 6/5--family aware Pt has overall guarded prognosis  Procedures: Family communication : dter in the room  consults : nephrology, neurology CODE STATUS: full DVT Prophylaxis :heprain Level of care: Med-Surg Status is: inpateint    Dispo: The patient is from: Home  Anticipated d/c is to: TBD  Patient currently is not medically stable to d/c.              Difficult to place patient No    TOTAL TIME TAKING CARE OF THIS PATIENT: *35* minutes.  >50% time spent on counselling and coordination of care  Note: This dictation was prepared with Dragon dictation along with smaller phrase technology. Any transcriptional errors that result from this process are unintentional.  Fritzi Mandes M.D    [facility]   CC: Primary care physician; Delsa Grana, PA-CPatient ID: Samuel Lope., male   DOB: 1927-10-11, 87 y.o.   MRN: [id number]

## 2021-04-03 NOTE — Progress Notes (Signed)
Subjective: No significant change  Exam: Vitals:   03/30/2021 0555 04/12/2021 0900  BP: (!) 103/40 105/84  Pulse: 72 80  Resp: 14 16  Temp: 97.7 F (36.5 C) 97.6 F (36.4 C)  SpO2: 100% 100%   Gen: In bed, NAD Resp: non-labored breathing, no acute distress Abd: soft, nt  Neuro: MS: Obtunded, does not open eyes or follow commands CN: Pupils are reactive bilaterally, slightly eccentric on the left. Closes eys tightly bilaterally.  Motor: minimal flexion vs withdrawal bilaterally to noxious stimulation, in boith UE and LE Sensory:as above.   Pertinent Labs: BUN 60-> 70 Cr 2.81 -> 3.17  Impression: 87 yo M with metabolic encephalopathy in the setting of AKI, chronic hearing loss, shortness of breath. I suspect that his MS is explainable by his increasing BUN, and other evaluations have not revealed other causes. EEG is pending, but I have low suspicion for seizures. Family has apparently decided to pursue dialysis.   Recommendations: 1) EEG pending 2) will follow.   Roland Rack, MD [facility] [phone]  If 7pm- 7am, please page neurology on call as listed in [place].

## 2021-04-03 NOTE — Progress Notes (Signed)
Dr Posey Pronto placed  order for patient to be transferred to ICU due to low blood pressures and patient not being able to receive dialysis.Patient transported from dialysis to ICU, report given to ICU receiving nurse.

## 2021-04-03 NOTE — Progress Notes (Signed)
[place], [place] 04/12/2021  Subjective:   Hospital day # 1   Patient known to our practice from outpatient follow-up.  Information obtained from outpatient chart as well as review of inpatient chart and patient's daughter.  She reports that patient was in his usual state of health which include he is independent, able to mow his yard and make some meals for himself.  3 days prior to admission, he fell on his knees.  He has been complaining of intermittent dizziness lightheadedness.  He had a cough, with clear sputum.  He also reported loose stools for 2 to 3 days prior to admission.  Found to have acute kidney injury with increased creatinine of 1.70 and hyperkalemia.  Nephrology consult requested for evaluation.    Patient seen laying in bed, eyes closed 2 sons and daughter at bedside, 2 daughters on speaker phone Voiced concern about patients current status and plan of care moving forward. States that patient would want " everything done and would want to live."  Renal: 06/05 0701 - 06/06 0700 In: 777.1 [I.V.:677; IV Piggyback:100.1] Out: -  Lab Results  Component Value Date   CREATININE 3.17 (H) 04/21/2021   CREATININE 2.81 (H) 04/02/2021   CREATININE 1.70 (H) 04/01/2021     Objective:  Vital signs in last 24 hours:  Temp:  [97.4 F (36.3 C)-97.8 F (36.6 C)] 97.6 F (36.4 C) (06/06 0900) Pulse Rate:  [70-91] 80 (06/06 0900) Resp:  [14-22] 16 (06/06 0900) BP: (82-109)/(27-84) 105/84 (06/06 0900) SpO2:  [95 %-100 %] 100 % (06/06 0900)  Weight change:  Filed Weights   04/23/2021 1045  Weight: 59 kg    Intake/Output:    Intake/Output Summary (Last 24 hours) at 04/04/2021 1031 Last data filed at 04/10/2021 0612 Gross per 24 hour  Intake 777.07 ml  Output --  Net 777.07 ml     Physical Exam: General:  Frail, elderly, laying in the bed  HEENT  dry oral mucous membranes  Pulm/lungs  normal breathing effort, diminished basilar breath sounds   CVS/Heart  no rub  Abdomen:   Soft, nontender  Extremities:  No peripheral edema  Neurologic:  Hard of hearing but able to follow simple commands, yes/no questions  Skin:  No acute rashes          Basic Metabolic Panel:  Recent Labs  Lab 04/14/2021 1051 04/21/2021 1649 04/01/21 0532 04/01/21 0811 04/01/21 1515 04/02/21 0913 04/02/21 1452 04/02/21 1930 04/02/2021 0505  NA 137  --  140  --   --  141  --   --  143  K 5.3*  --  6.0*   < > 5.5* 6.3* 5.9* 6.2* 6.2*  CL 97*  --  96*  --   --  102  --   --  100  CO2 33*  --  34*  --   --  28  --   --  35*  GLUCOSE 139*  --  159*  --   --  99  --   --  248*  BUN 30*  --  37*  --   --  60*  --   --  70*  CREATININE 1.59* 1.52* 1.70*  --   --  2.81*  --   --  3.17*  CALCIUM 9.2  --  8.8*  --   --  8.1*  --   --  7.7*  MG  --  2.3  --   --   --   --   --   --   --    < > =  values in this interval not displayed.     CBC: Recent Labs  Lab 04/17/2021 1051 04/12/2021 1649 04/01/21 0532  WBC 3.8* 4.2 7.3  HGB 11.3* 11.7* 13.2  HCT 37.5* 39.9 45.3  MCV 100.5* 102.6* 103.9*  PLT 207 199 237     No results found for: HEPBSAG, HEPBSAB, HEPBIGM    Microbiology:  Recent Results (from the past 240 hour(s))  Resp Panel by RT-PCR (Flu A&B, Covid) Nasopharyngeal Swab     Status: None   Collection Time: 04/19/2021 11:35 AM   Specimen: Nasopharyngeal Swab; Nasopharyngeal(NP) swabs in vial transport medium  Result Value Ref Range Status   SARS Coronavirus 2 by RT PCR NEGATIVE NEGATIVE Final    Comment: (NOTE) SARS-CoV-2 target nucleic acids are NOT DETECTED.  The SARS-CoV-2 RNA is generally detectable in upper respiratory specimens during the acute phase of infection. The lowest concentration of SARS-CoV-2 viral copies this assay can detect is 138 copies/mL. A negative result does not preclude SARS-Cov-2 infection and should not be used as the sole basis for treatment or other patient management decisions. A negative result may occur with   improper specimen collection/handling, submission of specimen other than nasopharyngeal swab, presence of viral mutation(s) within the areas targeted by this assay, and inadequate number of viral copies(<138 copies/mL). A negative result must be combined with clinical observations, patient history, and epidemiological information. The expected result is Negative.  Fact Sheet for Patients:  EntrepreneurPulse.com.au  Fact Sheet for Healthcare Providers:  IncredibleEmployment.be  This test is no t yet approved or cleared by the [place] FDA and  has been authorized for detection and/or diagnosis of SARS-CoV-2 by FDA under an Emergency Use Authorization (EUA). This EUA will remain  in effect (meaning this test can be used) for the duration of the COVID-19 declaration under Section 564(b)(1) of the Act, 21 U.S.C.section 360bbb-3(b)(1), unless the authorization is terminated  or revoked sooner.       Influenza A by PCR NEGATIVE NEGATIVE Final   Influenza B by PCR NEGATIVE NEGATIVE Final    Comment: (NOTE) The Xpert Xpress SARS-CoV-2/FLU/RSV plus assay is intended as an aid in the diagnosis of influenza from Nasopharyngeal swab specimens and should not be used as a sole basis for treatment. Nasal washings and aspirates are unacceptable for Xpert Xpress SARS-CoV-2/FLU/RSV testing.  Fact Sheet for Patients: EntrepreneurPulse.com.au  Fact Sheet for Healthcare Providers: IncredibleEmployment.be  This test is not yet approved or cleared by the [place] FDA and has been authorized for detection and/or diagnosis of SARS-CoV-2 by FDA under an Emergency Use Authorization (EUA). This EUA will remain in effect (meaning this test can be used) for the duration of the COVID-19 declaration under Section 564(b)(1) of the Act, 21 U.S.C. section 360bbb-3(b)(1), unless the authorization is terminated  or revoked.  Performed at [facility], [address] [place], [place] [address]     Coagulation Studies: No results for input(s): LABPROT, INR in the last 72 hours.  Urinalysis: No results for input(s): COLORURINE, LABSPEC, PHURINE, GLUCOSEU, HGBUR, BILIRUBINUR, KETONESUR, PROTEINUR, UROBILINOGEN, NITRITE, LEUKOCYTESUR in the last 72 hours.  Invalid input(s): APPERANCEUR    Imaging: MR ANGIO HEAD WO CONTRAST  Result Date: 04/02/2021 CLINICAL DATA:  87 year old male with head trauma, fall. Altered mental status. Hypoxia. EXAM: MRA HEAD WITHOUT CONTRAST TECHNIQUE: Angiographic images of the Circle of Willis were acquired using MRA technique without intravenous contrast. COMPARISON:  Brain MRI today reported separately. FINDINGS: Antegrade flow in the posterior circulation with patent distal  vertebral arteries and vertebrobasilar junction. Mild left V4 irregularity and stenosis. No distal right vertebral stenosis. Both PICA origins are patent. Patent basilar artery without stenosis. SCA and PCA origins are patent. Posterior communicating arteries are diminutive or absent. Mild motion artifact at the level of the PCAs, which appear grossly normal. Antegrade flow in both ICA siphons. The left siphon is asymmetrically smaller. Motion artifact limits evaluation of the distal cavernous and anterior genu segments, but no definite hemodynamically significant siphon stenosis is identified. Carotid termini are patent. Right ACA A1 is dominant. Anterior communicating artery and visible ACA branches are patent with mild irregularity. Both MCA M1 segments and MCA bifurcations are patent with mild irregularity. No proximal MCA branch occlusion is identified. IMPRESSION: 1. Evidence of intracranial atherosclerosis but no definite hemodynamically significant stenosis. 2. Left ICA siphon appears mildly smaller than the right. See Neck MRA findings today reported separately. Electronically Signed   By: Genevie Ann M.D.   On: 04/02/2021 10:29   MR ANGIO NECK WO CONTRAST  Result Date: 04/14/2021 CLINICAL DATA:  87 year old male with head trauma, fall. Altered mental status. Hypoxia. EXAM: MRA NECK WITHOUT CONTRAST TECHNIQUE: Angiographic images of the neck were obtained using MRA technique without intravenous contrast. Carotid stenosis measurements (when applicable) are obtained utilizing NASCET criteria, using the distal internal carotid diameter as the denominator. COMPARISON:  Head MRI and intracranial MRA from the same day reported separately. CTA neck 06/17/2017. FINDINGS: 3D time-of-flight imaging in the neck. Three vessel arch configuration with antegrade flow signal in the bilateral cervical carotid and vertebral arteries. Mild right carotid bifurcation irregularity appears stable since the 2018 CTA with no significant stenosis. The right ICA remains patent to the skull base. Left carotid endarterectomy since 2018 with resolved high-grade proximal left ICA stenosis. The left carotid bifurcation now appears within normal limits. Distal to the bulb the cervical left ICA is chronically smaller than the left, stable since 2018. Proximal subclavian arteries and vertebral artery origins are patent. Bilateral vertebral origin calcified plaque and stenosis seen in 2018 probably has not significantly changed. The right vertebral is mildly dominant as before. And no additional vertebral artery stenosis is identified at the skull base. IMPRESSION: 1. Left carotid endarterectomy with resolved high-grade proximal left ICA stenosis since 2018. Chronically small left ICA caliber is stable. 2. Stable right carotid bifurcation atherosclerosis without significant stenosis. 3. Chronic moderate to severe bilateral vertebral artery origin stenosis due to calcified plaque has not significantly changed. Both vertebrals remain patent to the skull base, the right is dominant as before. Electronically Signed   By: Genevie Ann M.D.   On:  04/14/2021 08:07   MR BRAIN WO CONTRAST  Result Date: 04/02/2021 CLINICAL DATA:  87 year old male with head trauma, fall. Altered mental status. Hypoxia. EXAM: MRI HEAD WITHOUT CONTRAST TECHNIQUE: Multiplanar, multiecho pulse sequences of the brain and surrounding structures were obtained without intravenous contrast. COMPARISON:  Head CT 03/29/2021. CTA neck 06/17/2017. FINDINGS: MRI HEAD FINDINGS Brain: Cerebral volume is within normal limits for age. No restricted diffusion to suggest acute infarction. No midline shift, mass effect, evidence of mass lesion, ventriculomegaly, extra-axial collection or acute intracranial hemorrhage. Cervicomedullary junction and pituitary are within normal limits. Incidental adhesion right frontal horn, normal variant. Largely normal for age gray and white matter signal throughout the brain. There is a small area of chronic encephalomalacia in the inferior right occipital pole (series 8, image 17) with possible mild hemosiderin. But no other encephalomalacia or chronic cerebral blood products identified.  Deep gray matter nuclei, brainstem and cerebellum appear negative. Vascular: Major intracranial vascular flow voids are preserved. Skull and upper cervical spine: Negative for age visible cervical spine. Normal bone marrow signal. Sinuses/Orbits: Postoperative changes to the left globe, otherwise negative orbits. Mild chronic left sphenoid sinus disease as demonstrated by CT. Other: Mastoids are clear. Grossly normal visible internal auditory structures. Visible scalp and face appear negative. IMPRESSION: 1. No acute intracranial abnormality. 2. 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 Electronically Signed   By: Genevie Ann M.D.   On: 04/02/2021 10:26   US RENAL  Result Date: 04/02/2021 CLINICAL DATA:  Acute renal failure EXAM: RENAL / URINARY TRACT ULTRASOUND COMPLETE COMPARISON:  None.  FINDINGS: Right Kidney: Renal measurements: 8.9 x 5.1 x 4.8 cm = volume: 112.9 mL. Echogenicity is increased. Renal cortical thickness is normal. No mass, perinephric fluid, or hydronephrosis visualized. No sonographically demonstrable calculus or ureterectasis. Left Kidney: Renal measurements: 9.3 x 0.2 x 4.9 cm = volume: 122.3 mL. Echogenicity is increased. Renal cortical thickness is normal. No mass, perinephric fluid, or hydronephrosis visualized. No sonographically demonstrable calculus or ureterectasis. Bladder: There is thickened urinary bladder wall with apparent debris posteriorly within the dependent portion of the bladder. Other: None. IMPRESSION: 1. Urinary bladder wall is thickened and contains debris. Suspect cystitis. 2. Kidneys are rather small and show increased renal echogenicity, findings indicative of a degree of underlying medical renal disease. Renal cortical thickness bilaterally is within normal limits. No obstructing focus in either kidney. Electronically Signed   By: Lowella Grip III M.D.   On: 04/02/2021 13:15     Medications:   . sodium chloride    . sodium chloride 100 mL/hr at 04/11/2021 0612  . cefTRIAXone (ROCEPHIN)  IV Stopped (04/02/21 1606)   . aspirin EC  81 mg Oral Daily  . docusate sodium  100 mg Oral Daily  . heparin injection (subcutaneous)  5,000 Units Subcutaneous Q8H  . latanoprost  1 drop Both Eyes QHS  . mometasone-formoterol  2 puff Inhalation BID  . sodium chloride flush  3 mL Intravenous Q12H  . timolol  1 drop Both Eyes BID   sodium chloride, acetaminophen **OR** acetaminophen, guaiFENesin, ipratropium-albuterol, ondansetron **OR** ondansetron (ZOFRAN) IV, sodium chloride flush  Assessment/ Plan:  87 y.o. male with diabetes, hypertension, chronic kidney disease, coronary artery disease, ex-smoker   admitted on 04/19/2021 for Acute respiratory failure (HCC) [J96.00] Hypoxia [R09.02] Acute respiratory failure with hypoxia (HCC)  [J96.01]   #Acute kidney injury on Chronic kidney disease stage IIIa Patient has underlying chronic kidney disease.  Risk factors include hypertension, atherosclerosis, advanced age.  Baseline creatinine of 1.6, GFR 49 from February 2022 AKI likely secondary to volume depletion and ATN. Creatinine continues to increase Renal ultrasound shows small kidneys with echogenicity  Recommend to maintain high blood pressure greater than [id number] systolic which is usual for patient. BP remains soft Discussed with the patient and family the need for dialysis, believed to be needed short term. They are agreeable. Spoke with vascular about placement of temp cath Dialysis will be initiated after placement Will order SPEP and kappa studies  #Acute hyperkalemia Shifting agents and Lokelma given yesterday with potassium improvement to 5.0 Potassium today is 6.2 Dialysis will be initiated today Will correct potassium with dialysis    LOS: [address] [place] 6/6/202210:31 AM  [facility] [place], [place]

## 2021-04-03 NOTE — Op Note (Signed)
  OPERATIVE NOTE   PROCEDURE: 1. Ultrasound guidance for vascular access right femoral vein 2. Placement of a 30 cm triple lumen dialysis catheter right femoral vein  PRE-OPERATIVE DIAGNOSIS: 1.Renal failure   POST-OPERATIVE DIAGNOSIS: Same  SURGEON: Leotis Pain, MD  ASSISTANT(S): None  ANESTHESIA: local  ESTIMATED BLOOD LOSS: Minimal   FINDING(S): 1. None  SPECIMEN(S): None  INDICATIONS:  Patient is a 87 y.o.male who presents with renal failure.  Risks and benefits were discussed, and informed consent was obtained..  DESCRIPTION: After obtaining full informed written consent, the patient was laid flat in the bed. The right groin was sterilely prepped and draped in a sterile surgical field was created. The right femoral vein was visualized with ultrasound and found to be widely patent. It was then accessed under direct guidance without difficulty with a Seldinger needle and a permanent image was recorded. A J-wire was then placed. After skin nick and dilatation, a 30 cm triple lumen dialysis catheter was placed over the wire and the wire was removed. The lumens withdrew dark red nonpulsatile blood and flushed easily with sterile saline. The catheter was secured to the skin with 3 nylon sutures. Sterile dressing was placed.  COMPLICATIONS: None  CONDITION: Stable  Leotis Pain 03/30/2021 2:43 PM  This note was created with Dragon Medical transcription system. Any errors in dictation are purely unintentional.

## 2021-04-04 ENCOUNTER — Inpatient Hospital Stay
Admit: 2021-04-04 | Discharge: 2021-04-04 | Disposition: A | Discharge status: Home / Self Care | Payer: Medicare PPO | Attending: Internal Medicine | Admitting: Internal Medicine

## 2021-04-04 ENCOUNTER — Inpatient Hospital Stay: Payer: Medicare PPO

## 2021-04-04 DIAGNOSIS — R109 Unspecified abdominal pain: Secondary | ICD-10-CM

## 2021-04-04 DIAGNOSIS — E43 Unspecified severe protein-calorie malnutrition: Secondary | ICD-10-CM | POA: Insufficient documentation

## 2021-04-04 DIAGNOSIS — Z7189 Other specified counseling: Secondary | ICD-10-CM

## 2021-04-04 DIAGNOSIS — G9341 Metabolic encephalopathy: Secondary | ICD-10-CM

## 2021-04-04 DIAGNOSIS — Z515 Encounter for palliative care: Secondary | ICD-10-CM

## 2021-04-04 DIAGNOSIS — G9340 Encephalopathy, unspecified: Secondary | ICD-10-CM

## 2021-04-04 DIAGNOSIS — N179 Acute kidney failure, unspecified: Secondary | ICD-10-CM

## 2021-04-04 LAB — RENAL FUNCTION PANEL
Albumin: 2.5 g/dL — ABNORMAL LOW (ref 3.5–5.0)
Anion gap: 5 (ref 5–15)
BUN: 70 mg/dL — ABNORMAL HIGH (ref 8–23)
CO2: 35 mmol/L — ABNORMAL HIGH (ref 22–32)
Calcium: 7.9 mg/dL — ABNORMAL LOW (ref 8.9–10.3)
Chloride: 104 mmol/L (ref 98–111)
Creatinine, Ser: 3.07 mg/dL — ABNORMAL HIGH (ref 0.61–1.24)
GFR, Estimated: 18 mL/min — ABNORMAL LOW (ref >60)
Glucose, Bld: 174 mg/dL — ABNORMAL HIGH (ref 70–99)
Phosphorus: 3.2 mg/dL (ref 2.5–4.6)
Potassium: 4.9 mmol/L (ref 3.5–5.1)
Sodium: 144 mmol/L (ref 135–145)

## 2021-04-04 LAB — PROTEIN ELECTROPHORESIS, SERUM
A/G Ratio: 1 (ref 0.7–1.7)
Albumin ELP: 2.7 g/dL — ABNORMAL LOW (ref 2.9–4.4)
Alpha-1-Globulin: 0.4 g/dL (ref 0.0–0.4)
Alpha-2-Globulin: 0.9 g/dL (ref 0.4–1.0)
Beta Globulin: 0.6 g/dL — ABNORMAL LOW (ref 0.7–1.3)
Gamma Globulin: 0.8 g/dL (ref 0.4–1.8)
Globulin, Total: 2.7 g/dL (ref 2.2–3.9)
Total Protein ELP: 5.4 g/dL — ABNORMAL LOW (ref 6.0–8.5)

## 2021-04-04 LAB — ECHOCARDIOGRAM COMPLETE
AR max vel: 2.64 cm2
AV Area VTI: 3.75 cm2
AV Area mean vel: 2.84 cm2
AV Mean grad: 2 mmHg
AV Peak grad: 2.9 mmHg
Ao pk vel: 0.85 m/s
Area-P 1/2: 3.65 cm2
Height: 66 in
S' Lateral: 1.61 cm
Weight: 2148.16 oz

## 2021-04-04 LAB — CBC
HCT: 31.4 % — ABNORMAL LOW (ref 39.0–52.0)
Hemoglobin: 9.3 g/dL — ABNORMAL LOW (ref 13.0–17.0)
MCH: 30.6 pg (ref 26.0–34.0)
MCHC: 29.6 g/dL — ABNORMAL LOW (ref 30.0–36.0)
MCV: 103.3 fL — ABNORMAL HIGH (ref 80.0–100.0)
Platelets: 122 10*3/uL — ABNORMAL LOW (ref 150–400)
RBC: 3.04 MIL/uL — ABNORMAL LOW (ref 4.22–5.81)
RDW: 14.4 % (ref 11.5–15.5)
WBC: 9.1 10*3/uL (ref 4.0–10.5)
nRBC: 0.2 % (ref 0.0–0.2)

## 2021-04-04 LAB — GLUCOSE, CAPILLARY
Glucose-Capillary: 104 mg/dL — ABNORMAL HIGH (ref 70–99)
Glucose-Capillary: 144 mg/dL — ABNORMAL HIGH (ref 70–99)
Glucose-Capillary: 145 mg/dL — ABNORMAL HIGH (ref 70–99)
Glucose-Capillary: 68 mg/dL — ABNORMAL LOW (ref 70–99)
Glucose-Capillary: 79 mg/dL (ref 70–99)
Glucose-Capillary: 96 mg/dL (ref 70–99)

## 2021-04-04 LAB — KAPPA/LAMBDA LIGHT CHAINS
Kappa free light chain: 62.8 mg/L — ABNORMAL HIGH (ref 3.3–19.4)
Kappa, lambda light chain ratio: 1.74 — ABNORMAL HIGH (ref 0.26–1.65)
Lambda free light chains: 36.1 mg/L — ABNORMAL HIGH (ref 5.7–26.3)

## 2021-04-04 LAB — MAGNESIUM: Magnesium: 2.3 mg/dL (ref 1.7–2.4)

## 2021-04-04 LAB — LACTIC ACID, PLASMA: Lactic Acid, Venous: 1.1 mmol/L (ref 0.5–1.9)

## 2021-04-04 MED ORDER — DOCUSATE SODIUM 50 MG/5ML PO LIQD
100.0000 mg | Freq: Every day | ORAL | Status: DC
  Administered 2021-04-04 – 2021-04-08 (×5): 100 mg
  Filled 2021-04-04 (×5): qty 10

## 2021-04-04 MED ORDER — NEPRO/CARBSTEADY PO LIQD: 1000.0000 mL | ORAL | Status: DC

## 2021-04-04 MED ORDER — ASPIRIN 81 MG PO CHEW
81.0000 mg | CHEWABLE_TABLET | Freq: Every day | ORAL | Status: DC
  Administered 2021-04-04 – 2021-04-08 (×5): 81 mg
  Filled 2021-04-04 (×5): qty 1

## 2021-04-04 MED ORDER — METOPROLOL TARTRATE 5 MG/5ML IV SOLN
2.5000 mg | Freq: Once | INTRAVENOUS | Status: AC
  Administered 2021-04-04: 2.5 mg via INTRAVENOUS
  Filled 2021-04-04: qty 5

## 2021-04-04 NOTE — Progress Notes (Signed)
*  PRELIMINARY RESULTS* Echocardiogram 2D Echocardiogram has been performed.  Sherrie Sport 04/04/2021, 2:33 PM

## 2021-04-04 NOTE — Progress Notes (Signed)
[place] [facility] [place] NAME: Samuel Bryan    MR#:  [id number]  DATE OF BIRTH:  04-16-1927  SUBJECTIVE:   Daughter Thayer Headings and granddaughter Santiago Glad in the room Remains lethargic although opens eyes to voice. Patient appears somewhat restless. Has mittens in his hands which is trying to pull. NG tube held. Patient was moved last night to ICU for hypotension. Did not require IV pressers. Remains tachycardic. Urine output 400 mL.  REVIEW OF SYSTEMS:   Review of Systems  Unable to perform ROS: Mental status change   Tolerating Diet:NPO Tolerating PT: pending MS change  DRUG ALLERGIES:  No Known Allergies  VITALS:  Blood pressure (!) 147/61, pulse 94, temperature 98.7 F (37.1 C), temperature source Axillary, resp. rate 20, height '5\' 6"'  (1.676 m), weight 60.9 kg, SpO2 100 %.  PHYSICAL EXAMINATION:   Physical Examlimited due to MS and pt participation  GENERAL:  87 y.o.-year-old patient lying in the bed with no acute distress.  Thin, fraile cachectic. NG+ HEENT: Head atraumatic, normocephalic. Oropharynx and nasopharynx clear. Dry eye secretions. ?left facial drool LUNGS: Normal breath sounds bilaterally, no wheezing, rales, rhonchi. No use of accessory muscles of respiration.  CARDIOVASCULAR: S1, S2 normal. No murmurs, rubs, or gallops. tachycardia ABDOMEN: Soft, nontender, nondistended. FOLEY+ EXTREMITIES: No cyanosis, clubbing or edema b/l.    NEUROLOGIC: unable to perform due to MS change PSYCHIATRIC:  patient is lethargic  LABORATORY PANEL:  CBC Recent Labs  Lab 04/04/21 0344  WBC 9.1  HGB 9.3*  HCT 31.4*  PLT 122*    Chemistries  Recent Labs  Lab 04/10/2021 1051 04/05/2021 1649 04/04/21 0344  NA 137   < > 144  K 5.3*   < > 4.9  CL 97*   < > 104  CO2 33*   < > 35*  GLUCOSE 139*   < > 174*  BUN 30*   < > 70*  CREATININE 1.59*   < > 3.07*  CALCIUM 9.2   < > 7.9*  MG  --    < > 2.3  AST 17  --   --   ALT 11  --   --    ALKPHOS 49  --   --   BILITOT 0.7  --   --    < > = values in this interval not displayed.   Cardiac Enzymes No results for input(s): TROPONINI in the last 168 hours. RADIOLOGY:  PERIPHERAL VASCULAR CATHETERIZATION  Result Date: 04/11/2021 See surgical note for result.  DG Abd Portable 1V  Result Date: 04/05/2021 CLINICAL DATA:  Nasogastric tube placement EXAM: PORTABLE ABDOMEN - 1 VIEW COMPARISON:  None. FINDINGS: Nasogastric tube is coiled in the upper esophagus with the tip directed superiorly and not seen. There is no bowel dilatation or air-fluid level to suggest bowel obstruction. No free air. There is apparent atelectasis left lower lobe. There is aortic atherosclerosis. IMPRESSION: Nasogastric tube coiled in upper esophagus with tip directed superiorly. No bowel obstruction or free air. Left lower lobe atelectatic change. Aortic Atherosclerosis (ICD10-I70.0). These results will be called to the ordering clinician or representative by the Radiologist Assistant, and communication documented in the PACS or [employer]. Electronically Signed   By: Lowella Grip III M.D.   On: 04/02/2021 11:41   DG Naso G Tube Plc W/Fl W/Rad  Result Date: 04/06/2021 CLINICAL DATA:  NG tube placement for feeding. EXAM: NASO G TUBE PLACEMENT WITH FL AND WITH RAD CONTRAST:  None. FLUOROSCOPY TIME:  Fluoroscopy Time:  0 minutes 9 seconds Radiation Exposure Index (if provided by the fluoroscopic device): 11.1 mGy Number of Acquired Spot Images: 1 COMPARISON:  None. FINDINGS: Nasogastric tube placed via right nostril and advanced to the stomach on first attempt with tip and side-port over the stomach in the left upper quadrant confirmed by fluoroscopic evaluation. Patient tolerated procedure well as the tube was secured and patient sent back to the ward in stable condition. IMPRESSION: Successful NG tube placement with tip and side-port over the stomach in the left upper quadrant. Electronically Signed   By:  Marin Olp M.D.   On: 04/15/2021 16:06   DG Bone Survey Met  Result Date: 04/01/2021 CLINICAL DATA:  87 year old with elevated PSA. EXAM: METASTATIC BONE SURVEY COMPARISON:  Recent head and chest CTs. FINDINGS: No evidence of blastic osseous lesion. No destructive lytic lesion. Enteric tube is in place with tip and side-port in the stomach. Right femoral catheter is in place. Multiple vascular stents in the abdomen. Retrocardiac and left lung base hazy opacity are new from prior exam. Stable right lung base scarring. Age related degenerative change in the spine. Calvarial calcification is pineal in recent head CT. IMPRESSION: 1. No evidence of focal osseous lesion. Please note, nuclear medicine bone scan may be considered if there is clinical concern for prostate metastasis. 2. Hazy left lung base opacity is new from recent chest CT and may represent airspace disease/atelectasis or pleural fluid. Electronically Signed   By: Keith Rake M.D.   On: 04/13/2021 16:32   EEG adult  Result Date: 04/22/2021 Greta Doom, MD     04/04/2021  9:29 AM History: 87 yo with AMS Sedation: none Technique: This is a 21 channel routine scalp EEG performed at the bedside with bipolar and monopolar montages arranged in accordance to the international 10/20 system of electrode placement. One channel was dedicated to EKG recording. Background: The background consists predominantly of 5 - '6Hz'  theta activity with some irregular delta range activity superimposed as well.  There is occasionally visible a posterior dominant rhythm(PDR) of 7 to 8 Hz.  There is no evolution or other aspects concerning for ictal activity. There are also frequent bifrontally predominant discharges with triphasic morphology.  These are at times periodic. Photic stimulation: Physiologic driving is present EEG Abnormalities: 1) triphasic waves 2) diffuse slow activity 3) slow PDR Clinical Interpretation: This EEG is consistent with a generalized  nonspecific cerebral dysfunction (encephalopathy). There was no seizure or seizure predisposition recorded on this study. Please note that lack of epileptiform activity on EEG does not preclude the possibility of epilepsy. Roland Rack, MD [facility] [phone] If 7pm- 7am, please page neurology on call as listed in [facility].   ASSESSMENT AND PLAN:  Lamond Glantz Jr.is a 87 y.o.malewith medical history significant forhypertension, diabetes mellitus with stage III chronic kidney disease, coronary artery disease, ex-smoker who was sent to the emergency room from his primary care provider's office for evaluation of hypoxia. His daughter states that he fell 3 days prior to his admission.He was trying to get into a chair when he fell on his knees.  Acute metabolic encephalopathy appears multifactorial suspect due to AKI on CKD IIIa -- suspect dehydration, poor PO intake, h/o recent fall, weight loss -- patient very hard on hearing --CT head on admission--nothing acute except mild atrophy- -- 6/5-- remains lethargic despite IVF --Neurology consult--d/w Dr Rory Percy --MRI brain, MR Angio head and neck wo contrast---negative for stroke   --  EEG negative  --no source of infection so far --UA, UC pending --Empirically on IV rocephin --6/7-- patient was moved to ICU for hypotension. Continues to remain intermittently tachycardic.  Acute on chronic CKD stage IV Hyperkalemia, acidosis -- baseline creatinine 1.5 -- came in with creatinine of 1.7 -- 2.81--3.17 -- treated  with insulin, dextrose and Kayexalate enema, bicarb IV push and bicarb gtt -- potassium was 6.3---5.0--5.5--6.2--Rx given--5.9--6.3--rx given--6.3--4.9 -- continue IV fluids--received bicarb gtt and NS -- nephrology consultation with Dr. Candiss Norse --6/7-- no indication for acute dialysis per Dr. Candiss Norse. Patient started making urine. . Potassium improved  Acute respiratory failure with transient hypoxia -- patient  not in respiratory distress.sats 100% 4liters -- Patient has has history of COPD and emphysema in the past -- wean oxygen as tolerated -- chest x-ray negative for pneumonia -- Pro calcitonin negative, no fever, WBC normal, no evidence of infection/PNA on CT chest --COVID and Flu negative -- sats hundred percent on 1 L.  Status post fall -- PT to see patient when appropriate  Hypertension -- soft bp --holding po meds  Poor PO intake and altered mental status with cough -- speech therapy to see patient once mter alert to follow instrctuions -- will keep patient NPO   Type II diabetes with CKD stage IV -- SSI for now  Malnutrition--moderate to severe Weight loss --Dietitian consulted -- b12 5630, Vit D 36, B1 pending --PSA 6.58 (elevated)-- bone skeletal survey negative --CEA pending -- NG tube placed under fluoro. Tube feeding held due to gas pains/abdominal discomfort  Palliative care to see patient for goals of care-- ordered by ICU attending  D/w family--[facility] on 6/5--family aware Pt has overall guarded prognosis  Procedures: Family communication :  family in the room  consults : nephrology, neurology CODE STATUS: full DVT Prophylaxis :heprain Level of care: Med-Surg Status is: inpateint    Dispo: The patient is from: Home  Anticipated d/c is to: TBD  Patient currently is not medically stable to d/c.              Difficult to place patient No    TOTAL TIME TAKING CARE OF THIS PATIENT: *35* minutes.  >50% time spent on counselling and coordination of care  Note: This dictation was prepared with Dragon dictation along with smaller phrase technology. Any transcriptional errors that result from this process are unintentional.  Fritzi Mandes M.D    [facility]   CC: Primary care physician; Delsa Grana, PA-CPatient ID: Desmond Lope., male   DOB: [birth year], 87 y.o.   MRN:  [id number]

## 2021-04-04 NOTE — Progress Notes (Signed)
[place], [place] 04/04/21  Subjective:   Hospital day # 2   Patient known to our practice from outpatient follow-up.  Information obtained from outpatient chart as well as review of inpatient chart and patient's daughter.  She reports that patient was in his usual state of health which include he is independent, able to mow his yard and make some meals for himself.  3 days prior to admission, he fell on his knees.  He has been complaining of intermittent dizziness lightheadedness.  He had a cough, with clear sputum.  He also reported loose stools for 2 to 3 days prior to admission.  Found to have acute kidney injury with increased creatinine of 1.70 and hyperkalemia.  Nephrology consult requested for evaluation.    Patient seen laying in bed, eyes closed Daughter at bedside, with Mr. Trilby Drummer on the phone  Overnight patient was transferred to the ICU for hypotension This morning, blood pressure has been in the 130s Patient now has a Foley catheter and urine output appears to be better.  Urine is clear yellow. Getting NG tube feeds Has a weak cough and is producing some sputum  Renal: 06/06 0701 - 06/07 0700 In: 667.7 [I.V.:667.7] Out: 100 [Urine:100] Lab Results  Component Value Date   CREATININE 3.07 (H) 04/04/2021   CREATININE 3.30 (H) 03/30/2021   CREATININE 3.17 (H) 03/29/2021     Objective:  Vital signs in last 24 hours:  Temp:  [97.4 F (36.3 C)-98.9 F (37.2 C)] 98.9 F (37.2 C) (06/07 0800) Pulse Rate:  [50-98] 93 (06/07 0800) Resp:  [12-29] 12 (06/07 0800) BP: (69-146)/(36-113) 137/40 (06/07 0800) SpO2:  [87 %-100 %] 100 % (06/07 0800) Weight:  [60.9 kg] 60.9 kg (06/07 0500)  Weight change:  Filed Weights   04/26/2021 1045 04/04/21 0500  Weight: 59 kg 60.9 kg    Intake/Output:    Intake/Output Summary (Last 24 hours) at 04/04/2021 4098 Last data filed at 04/04/2021 0800 Gross per 24 hour  Intake 929.72 ml  Output 425 ml  Net  504.72 ml     Physical Exam: General:  Frail, elderly, laying in the bed  HEENT  dry oral mucous membranes, NG tube in place  Pulm/lungs  normal breathing effort, diminished basilar breath sounds  CVS/Heart  no rub, irregular, tachycardic  Abdomen:   Soft, nontender  Extremities:  No peripheral edema  Neurologic:  Hard of hearing, responds to simple commands   Skin:  No acute rashes   Foley catheter in place   Right femoral dialysis catheter in place    Basic Metabolic Panel:  Recent Labs  Lab 04/22/2021 1649 04/01/21 0532 04/01/21 0811 04/02/21 0913 04/02/21 1452 04/02/21 1930 04/02/2021 0505 04/05/2021 2133 04/04/21 0344  NA  --  140  --  141  --   --  143 142 144  K  --  6.0*   < > 6.3* 5.9* 6.2* 6.2* 5.4* 4.9  CL  --  96*  --  102  --   --  100 103 104  CO2  --  34*  --  28  --   --  35* 34* 35*  GLUCOSE  --  159*  --  99  --   --  248* 179* 174*  BUN  --  37*  --  60*  --   --  70* 72* 70*  CREATININE 1.52* 1.70*  --  2.81*  --   --  3.17* 3.30* 3.07*  CALCIUM  --  8.8*  --  8.1*  --   --  7.7* 7.8* 7.9*  MG 2.3  --   --   --   --   --   --  2.5* 2.3  PHOS  --   --   --   --   --   --   --  3.5 3.2   < > = values in this interval not displayed.     CBC: Recent Labs  Lab 04/01/2021 1051 04/02/2021 1649 04/01/21 0532 04/04/21 0344  WBC 3.8* 4.2 7.3 9.1  HGB 11.3* 11.7* 13.2 9.3*  HCT 37.5* 39.9 45.3 31.4*  MCV 100.5* 102.6* 103.9* 103.3*  PLT 207 199 237 122*      Lab Results  Component Value Date   HEPBSAG NON REACTIVE 04/22/2021   HEPBSAB NON REACTIVE 04/06/2021      Microbiology:  Recent Results (from the past 240 hour(s))  Resp Panel by RT-PCR (Flu A&B, Covid) Nasopharyngeal Swab     Status: None   Collection Time: 04/17/2021 11:35 AM   Specimen: Nasopharyngeal Swab; Nasopharyngeal(NP) swabs in vial transport medium  Result Value Ref Range Status   SARS Coronavirus 2 by RT PCR NEGATIVE NEGATIVE Final    Comment: (NOTE) SARS-CoV-2 target nucleic  acids are NOT DETECTED.  The SARS-CoV-2 RNA is generally detectable in upper respiratory specimens during the acute phase of infection. The lowest concentration of SARS-CoV-2 viral copies this assay can detect is 138 copies/mL. A negative result does not preclude SARS-Cov-2 infection and should not be used as the sole basis for treatment or other patient management decisions. A negative result may occur with  improper specimen collection/handling, submission of specimen other than nasopharyngeal swab, presence of viral mutation(s) within the areas targeted by this assay, and inadequate number of viral copies(<138 copies/mL). A negative result must be combined with clinical observations, patient history, and epidemiological information. The expected result is Negative.  Fact Sheet for Patients:  EntrepreneurPulse.com.au  Fact Sheet for Healthcare Providers:  IncredibleEmployment.be  This test is no t yet approved or cleared by the [place] FDA and  has been authorized for detection and/or diagnosis of SARS-CoV-2 by FDA under an Emergency Use Authorization (EUA). This EUA will remain  in effect (meaning this test can be used) for the duration of the COVID-19 declaration under Section 564(b)(1) of the Act, 21 U.S.C.section 360bbb-3(b)(1), unless the authorization is terminated  or revoked sooner.       Influenza A by PCR NEGATIVE NEGATIVE Final   Influenza B by PCR NEGATIVE NEGATIVE Final    Comment: (NOTE) The Xpert Xpress SARS-CoV-2/FLU/RSV plus assay is intended as an aid in the diagnosis of influenza from Nasopharyngeal swab specimens and should not be used as a sole basis for treatment. Nasal washings and aspirates are unacceptable for Xpert Xpress SARS-CoV-2/FLU/RSV testing.  Fact Sheet for Patients: EntrepreneurPulse.com.au  Fact Sheet for Healthcare Providers: IncredibleEmployment.be  This  test is not yet approved or cleared by the [place] FDA and has been authorized for detection and/or diagnosis of SARS-CoV-2 by FDA under an Emergency Use Authorization (EUA). This EUA will remain in effect (meaning this test can be used) for the duration of the COVID-19 declaration under Section 564(b)(1) of the Act, 21 U.S.C. section 360bbb-3(b)(1), unless the authorization is terminated or revoked.  Performed at [facility], [address], [place], [place] [address]   MRSA PCR Screening     Status: None   Collection Time: 04/19/2021  9:04 PM  Specimen: Nasal Mucosa; Nasopharyngeal  Result Value Ref Range Status   MRSA by PCR NEGATIVE NEGATIVE Final    Comment:        The GeneXpert MRSA Assay (FDA approved for NASAL specimens only), is one component of a comprehensive MRSA colonization surveillance program. It is not intended to diagnose MRSA infection nor to guide or monitor treatment for MRSA infections. Performed at [facility], [address] [place], [place] [address]     Coagulation Studies: No results for input(s): LABPROT, INR in the last 72 hours.  Urinalysis: Recent Labs    04/02/21 2110  COLORURINE YELLOW*  LABSPEC 1.012  PHURINE 5.0  GLUCOSEU NEGATIVE  HGBUR SMALL*  BILIRUBINUR NEGATIVE  KETONESUR NEGATIVE  PROTEINUR NEGATIVE  NITRITE NEGATIVE  LEUKOCYTESUR NEGATIVE      Imaging: MR ANGIO HEAD WO CONTRAST  Result Date: 04/02/2021 CLINICAL DATA:  87 year old male with head trauma, fall. Altered mental status. Hypoxia. EXAM: MRA HEAD WITHOUT CONTRAST TECHNIQUE: Angiographic images of the Circle of Willis were acquired using MRA technique without intravenous contrast. COMPARISON:  Brain MRI today reported separately. FINDINGS: Antegrade flow in the posterior circulation with patent distal vertebral arteries and vertebrobasilar junction. Mild left V4 irregularity and stenosis. No distal right vertebral stenosis. Both PICA  origins are patent. Patent basilar artery without stenosis. SCA and PCA origins are patent. Posterior communicating arteries are diminutive or absent. Mild motion artifact at the level of the PCAs, which appear grossly normal. Antegrade flow in both ICA siphons. The left siphon is asymmetrically smaller. Motion artifact limits evaluation of the distal cavernous and anterior genu segments, but no definite hemodynamically significant siphon stenosis is identified. Carotid termini are patent. Right ACA A1 is dominant. Anterior communicating artery and visible ACA branches are patent with mild irregularity. Both MCA M1 segments and MCA bifurcations are patent with mild irregularity. No proximal MCA branch occlusion is identified. IMPRESSION: 1. Evidence of intracranial atherosclerosis but no definite hemodynamically significant stenosis. 2. Left ICA siphon appears mildly smaller than the right. See Neck MRA findings today reported separately. Electronically Signed   By: Genevie Ann M.D.   On: 04/02/2021 10:29   MR ANGIO NECK WO CONTRAST  Result Date: 04/04/2021 CLINICAL DATA:  87 year old male with head trauma, fall. Altered mental status. Hypoxia. EXAM: MRA NECK WITHOUT CONTRAST TECHNIQUE: Angiographic images of the neck were obtained using MRA technique without intravenous contrast. Carotid stenosis measurements (when applicable) are obtained utilizing NASCET criteria, using the distal internal carotid diameter as the denominator. COMPARISON:  Head MRI and intracranial MRA from the same day reported separately. CTA neck 06/17/2017. FINDINGS: 3D time-of-flight imaging in the neck. Three vessel arch configuration with antegrade flow signal in the bilateral cervical carotid and vertebral arteries. Mild right carotid bifurcation irregularity appears stable since the 2018 CTA with no significant stenosis. The right ICA remains patent to the skull base. Left carotid endarterectomy since 2018 with resolved high-grade proximal  left ICA stenosis. The left carotid bifurcation now appears within normal limits. Distal to the bulb the cervical left ICA is chronically smaller than the left, stable since 2018. Proximal subclavian arteries and vertebral artery origins are patent. Bilateral vertebral origin calcified plaque and stenosis seen in 2018 probably has not significantly changed. The right vertebral is mildly dominant as before. And no additional vertebral artery stenosis is identified at the skull base. IMPRESSION: 1. Left carotid endarterectomy with resolved high-grade proximal left ICA stenosis since 2018. Chronically small left ICA caliber is stable. 2. Stable  right carotid bifurcation atherosclerosis without significant stenosis. 3. Chronic moderate to severe bilateral vertebral artery origin stenosis due to calcified plaque has not significantly changed. Both vertebrals remain patent to the skull base, the right is dominant as before. Electronically Signed   By: Genevie Ann M.D.   On: 04/17/2021 08:07   MR BRAIN WO CONTRAST  Result Date: 04/02/2021 CLINICAL DATA:  87 year old male with head trauma, fall. Altered mental status. Hypoxia. EXAM: MRI HEAD WITHOUT CONTRAST TECHNIQUE: Multiplanar, multiecho pulse sequences of the brain and surrounding structures were obtained without intravenous contrast. COMPARISON:  Head CT 04/10/2021. CTA neck 06/17/2017. FINDINGS: MRI HEAD FINDINGS Brain: Cerebral volume is within normal limits for age. No restricted diffusion to suggest acute infarction. No midline shift, mass effect, evidence of mass lesion, ventriculomegaly, extra-axial collection or acute intracranial hemorrhage. Cervicomedullary junction and pituitary are within normal limits. Incidental adhesion right frontal horn, normal variant. Largely normal for age gray and white matter signal throughout the brain. There is a small area of chronic encephalomalacia in the inferior right occipital pole (series 8, image 17) with possible mild  hemosiderin. But no other encephalomalacia or chronic cerebral blood products identified. Deep gray matter nuclei, brainstem and cerebellum appear negative. Vascular: Major intracranial vascular flow voids are preserved. Skull and upper cervical spine: Negative for age visible cervical spine. Normal bone marrow signal. Sinuses/Orbits: Postoperative changes to the left globe, otherwise negative orbits. Mild chronic left sphenoid sinus disease as demonstrated by CT. Other: Mastoids are clear. Grossly normal visible internal auditory structures. Visible scalp and face appear negative. IMPRESSION: 1. No acute intracranial abnormality. 2. 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 Electronically Signed   By: Genevie Ann M.D.   On: 04/02/2021 10:26   US RENAL  Result Date: 04/02/2021 CLINICAL DATA:  Acute renal failure EXAM: RENAL / URINARY TRACT ULTRASOUND COMPLETE COMPARISON:  None. FINDINGS: Right Kidney: Renal measurements: 8.9 x 5.1 x 4.8 cm = volume: 112.9 mL. Echogenicity is increased. Renal cortical thickness is normal. No mass, perinephric fluid, or hydronephrosis visualized. No sonographically demonstrable calculus or ureterectasis. Left Kidney: Renal measurements: 9.3 x 0.2 x 4.9 cm = volume: 122.3 mL. Echogenicity is increased. Renal cortical thickness is normal. No mass, perinephric fluid, or hydronephrosis visualized. No sonographically demonstrable calculus or ureterectasis. Bladder: There is thickened urinary bladder wall with apparent debris posteriorly within the dependent portion of the bladder. Other: None. IMPRESSION: 1. Urinary bladder wall is thickened and contains debris. Suspect cystitis. 2. Kidneys are rather small and show increased renal echogenicity, findings indicative of a degree of underlying medical renal disease. Renal cortical thickness bilaterally is within normal limits. No obstructing focus in either  kidney. Electronically Signed   By: Lowella Grip III M.D.   On: 04/02/2021 13:15   PERIPHERAL VASCULAR CATHETERIZATION  Result Date: 04/10/2021 See surgical note for result.  DG Abd Portable 1V  Result Date: 04/15/2021 CLINICAL DATA:  Nasogastric tube placement EXAM: PORTABLE ABDOMEN - 1 VIEW COMPARISON:  None. FINDINGS: Nasogastric tube is coiled in the upper esophagus with the tip directed superiorly and not seen. There is no bowel dilatation or air-fluid level to suggest bowel obstruction. No free air. There is apparent atelectasis left lower lobe. There is aortic atherosclerosis. IMPRESSION: Nasogastric tube coiled in upper esophagus with tip directed superiorly. No bowel obstruction or free air. Left lower lobe atelectatic change. Aortic Atherosclerosis (ICD10-I70.0). These results will be called to the  ordering clinician or representative by the Radiologist Assistant, and communication documented in the PACS or [employer]. Electronically Signed   By: Lowella Grip III M.D.   On: 04/07/2021 11:41   DG Naso G Tube Plc W/Fl W/Rad  Result Date: 04/02/2021 CLINICAL DATA:  NG tube placement for feeding. EXAM: NASO G TUBE PLACEMENT WITH FL AND WITH RAD CONTRAST:  None. FLUOROSCOPY TIME:  Fluoroscopy Time:  0 minutes 9 seconds Radiation Exposure Index (if provided by the fluoroscopic device): 11.1 mGy Number of Acquired Spot Images: 1 COMPARISON:  None. FINDINGS: Nasogastric tube placed via right nostril and advanced to the stomach on first attempt with tip and side-port over the stomach in the left upper quadrant confirmed by fluoroscopic evaluation. Patient tolerated procedure well as the tube was secured and patient sent back to the ward in stable condition. IMPRESSION: Successful NG tube placement with tip and side-port over the stomach in the left upper quadrant. Electronically Signed   By: Marin Olp M.D.   On: 04/05/2021 16:06   DG Bone Survey Met  Result Date: 04/06/2021 CLINICAL  DATA:  87 year old with elevated PSA. EXAM: METASTATIC BONE SURVEY COMPARISON:  Recent head and chest CTs. FINDINGS: No evidence of blastic osseous lesion. No destructive lytic lesion. Enteric tube is in place with tip and side-port in the stomach. Right femoral catheter is in place. Multiple vascular stents in the abdomen. Retrocardiac and left lung base hazy opacity are new from prior exam. Stable right lung base scarring. Age related degenerative change in the spine. Calvarial calcification is pineal in recent head CT. IMPRESSION: 1. No evidence of focal osseous lesion. Please note, nuclear medicine bone scan may be considered if there is clinical concern for prostate metastasis. 2. Hazy left lung base opacity is new from recent chest CT and may represent airspace disease/atelectasis or pleural fluid. Electronically Signed   By: Keith Rake M.D.   On: 04/02/2021 16:32     Medications:   . sodium chloride    . sodium chloride 100 mL/hr at 04/04/21 0337  . sodium chloride    . sodium chloride    . cefTRIAXone (ROCEPHIN)  IV Stopped (04/02/21 1606)  . norepinephrine (LEVOPHED) Adult infusion     . aspirin EC  81 mg Oral Daily  . Chlorhexidine Gluconate Cloth  6 each Topical Q0600  . docusate sodium  100 mg Oral Daily  . [START ON 04/05/2021] feeding supplement (NEPRO CARB STEADY)  1,000 mL Per Tube Q24H  . heparin injection (subcutaneous)  5,000 Units Subcutaneous Q8H  . latanoprost  1 drop Both Eyes QHS  . mometasone-formoterol  2 puff Inhalation BID  . sodium chloride flush  3 mL Intravenous Q12H  . timolol  1 drop Both Eyes BID   sodium chloride, sodium chloride, sodium chloride, acetaminophen **OR** acetaminophen, alteplase, guaiFENesin, heparin, ipratropium-albuterol, lidocaine (PF), lidocaine-prilocaine, ondansetron **OR** ondansetron (ZOFRAN) IV, pentafluoroprop-tetrafluoroeth, sodium chloride flush  Assessment/ Plan:  87 y.o. male with diabetes, hypertension, chronic kidney  disease, coronary artery disease, ex-smoker, history of elevated PSA admitted on 04/02/2021 for Acute respiratory failure (HCC) [J96.00] Hypoxia [R09.02] Acute respiratory failure with hypoxia (HCC) [J96.01]   #Acute kidney injury on Chronic kidney disease stage IIIa Patient has underlying chronic kidney disease.  Risk factors include hypertension, atherosclerosis, advanced age.  Baseline creatinine of 1.6, GFR 49 from February 2022 AKI likely secondary to volume depletion and ATN. Renal ultrasound shows small kidneys with echogenicity  Urinalysis 0-5 RBCs, specific gravity of 1.012, negative for  nitrites and leukocytes  Yesterday, dialysis was contemplated but did not get started because of low blood pressure This morning, hemodynamics are better.  However urine output has started to improve as well as creatinine Remains in atrial fibrillation and tachycardic with heart rate up to 130s  Plan for careful monitoring Dialysis on standby but will not pursue today due to slight improvement that is noted Continue supportive care   #Acute hyperkalemia Improved with Lokelma Monitor carefully  Plan of care discussed with family present.  They agree with the plan.    LOS: [place] 6/7/20229:23 [place], [place]

## 2021-04-04 NOTE — Progress Notes (Signed)
PT Cancellation Note  Patient Details Name: Samuel Bryan. MRN: [id number] DOB: 1926-12-21   Cancelled Treatment:    Reason Eval/Treat Not Completed: Medical issues which prohibited therapy. Pt transferred to ICU due to change in status. Also noted for placement of temporary dialysis catheter. PT to complete orders at this time, please re-consult when patient is able to meaningfully participate in evaluation.  Lieutenant Diego PT, DPT 9:01 AM,04/04/21

## 2021-04-04 NOTE — Procedures (Signed)
History: 87 yo with AMS  Sedation: none  Technique: This is a 21 channel routine scalp EEG performed at the bedside with bipolar and monopolar montages arranged in accordance to the international 10/20 system of electrode placement. One channel was dedicated to EKG recording.    Background: The background consists predominantly of 5 - '6Hz'$  theta activity with some irregular delta range activity superimposed as well.  There is occasionally visible a posterior dominant rhythm(PDR) of 7 to 8 Hz.  There is no evolution or other aspects concerning for ictal activity. There are also frequent bifrontally predominant discharges with triphasic morphology.  These are at times periodic.  Photic stimulation: Physiologic driving is present  EEG Abnormalities: 1) triphasic waves 2) diffuse slow activity 3) slow PDR  Clinical Interpretation: This EEG is consistent with a generalized nonspecific cerebral dysfunction (encephalopathy). There was no seizure or seizure predisposition recorded on this study. Please note that lack of epileptiform activity on EEG does not preclude the possibility of epilepsy.   Roland Rack, MD [facility] [phone]  If 7pm- 7am, please page neurology on call as listed in [place].

## 2021-04-04 NOTE — Consult Note (Addendum)
Consultation Note Date: 04/04/2021   Patient Name: Samuel Bryan.  DOB: 1926-10-30  MRN: [id number]  Age / Sex: 87 y.o., male   PCP: Samuel Grana, PA-C Referring Physician: Fritzi Mandes, MD   REASON FOR CONSULTATION:Establishing goals of care  Palliative Care consult requested for goals of care discussion in this 87 y.o. male with a medical history significant for diabetes, CKD stage III, coronary artery disease, hypertension, PVD, and former smoker.  Patient presented to the ED with concerns for generalized weakness.  Was reported patient fell 3 days prior to admission and also experiencing intermittent episodes of dizziness and changes in appetite.  Patient was seen at PCPs office and found to be hypoxic (88%) and encouraged further evaluation.  During work-up chest x-ray showed elevated right hemidiaphragm with right lower lobe atelectasis.  CT of head showed no acute abnormalities, mild atrophy and mild white matter ischemia.  CT of chest showed right lung scarring and pleural calcifications.  Patient recently transferred to ICU (04/20/2021) due to increased lethargy, hypotension, tachycardia, and renal failure.  He has been followed up by nephrology and neurology.  Clinical Assessment and Goals of Care: I have reviewed medical records including lab results, imaging, Epic notes, and MAR, received report from the bedside RN, and assessed the patient.   I met at the bedside with patient's daughter Samuel Bryan, son Samuel Bryan (via phone) and granddaughter, Samuel Bryan to discuss diagnosis prognosis, Samuel Bryan, [name], disposition and options.  Patient is awake, unable to follow commands, occasional moaning noted and attempts to clear mucous.  I introduced Palliative Medicine as specialized medical care for people living with serious illness. It focuses on providing relief from the symptoms and stress of a serious illness. The goal is to improve quality of life for both the patient and the family.  We  discussed a brief life review of the patient, along with his functional and nutritional status.  Patient lives in a home with his daughter Samuel Bryan.  His wife passed away 7 years ago.  Patient has 5 children who are actively involved in his care.  He retired from [employer] as a Sports coach.  He is of [place] faith.  1 week prior to admission patient was actively mowing his lawn including his neighbors 2-3 times per week.  He was currently still driving and able to perform all ADLs independently per family.  States patient had a great appetite however also endorses a significant amount of weight loss over the past year.  We discussed His current illness and what it means in the larger context of His on-going co-morbidities. Natural disease trajectory and expectations at EOL were discussed.  Dr. Mortimer Bryan at the bedside providing extensive updates and also assisting in goals of care discussions. Family verbalizes understanding of patient's current illness and poor prognosis.   A detailed discussion was had today regarding advanced directives.  Concepts specific to code status, artifical feeding and hydration, continued IV antibiotics and rehospitalization. Family confirms patient does not have an advanced directive.  Detailed discussion and education provided on full CODE STATUS with consideration of patient's current illness, frailty, and comorbidities.  Recommendations provided for DNR/DNI.  Family continues to request full code/full scope care expressing patient will want everything done to allow him to survive.  I discussed at length explaining what a code scenario would look like for patient and minimal chances of survival. Dr. Mortimer Bryan also assisted in discussion. I encouraged family to consider patient's quality of life and those  things of importance to him, knowing he would not be able to do them again. Daughter is tearful expressing her understanding.   The difference between a aggressive  medical intervention and a palliative comfort care path were discussed at length.  Values and goals of care important to patient and family were attempted to be elicited.   Family is planning a meeting amongst themselves later this afternoon to further discuss wishes. I again encourage them as they meet to keep patient's quality of life as the center of their decisions providing what future could look like. [name]) spoke with me outside of the room. She shares her mother, Samuel Bryan is battling cancer and has a prognosis of 6 months or less. She states her mother is not aware of this and is not accepting however, she expressed after our discussion that she felt she would pass away prior to her dad. Samuel Bryan shares she plans to lead her family meeting today with awareness of her grandfather's poor prognosis and knowing he would not want heroic measures given high risk of mortality and trauma. I also discussed at length patient most likely would not tolerate dialysis if needed given multi-organ failure. She is hopeful her aunts and uncles will be accepting. She is asking about what hospice would look like in the home vs hospitalized. Education provided. She requested not to share our conversations with family especially her mother, Samuel Bryan.    I discussed the importance of continued conversation with family and their medical providers regarding overall plan of care and treatment options, ensuring decisions are within the context of the patients values and GOCs.   Questions and concerns were addressed.  The family was encouraged to call with questions or concerns.  PMT will continue to support holistically as needed.   CODE STATUS: Full code  ADVANCE DIRECTIVES: Primary Decision Maker: 5 children    SYMPTOM MANAGEMENT:per attending   Palliative Prophylaxis:   Aspiration, Bowel Regimen, Delirium Protocol, Eye Care, Frequent Pain Assessment, Oral Care and Turn  Reposition  PSYCHO-SOCIAL/SPIRITUAL:  Support System: Family Desire for further Chaplaincy support:No   Additional Recommendations (Limitations, Scope, Preferences):  continue to treat the treatable, full code  Education on hospice/palliative    PAST MEDICAL HISTORY: Past Medical History:  Diagnosis Date  . Anemia   . Atherosclerosis of renal artery ([facility])   . Essential hypertension, benign   . Occlusion and stenosis of carotid artery without mention of cerebral infarction    moderate left ICA stenosis  . Peripheral vascular disease, unspecified ([place])    mild lifestyle limiting claudication  . Pure hypercholesterolemia   . Renal insufficiency   . Type 2 diabetes mellitus ([facility])     ALLERGIES:  has No Known Allergies.   MEDICATIONS:  Current Facility-Administered Medications  Medication Dose Route Frequency Provider Last Rate Last Admin  . 0.9 %  sodium chloride infusion  250 mL Intravenous PRN Algernon Huxley, MD      . 0.9 %  sodium chloride infusion   Intravenous Continuous Algernon Huxley, MD 100 mL/hr at 04/04/21 0337 New Bag at 04/04/21 7494  . 0.9 %  sodium chloride infusion  100 mL Intravenous PRN Algernon Huxley, MD      . 0.9 %  sodium chloride infusion  100 mL Intravenous PRN Algernon Huxley, MD      . acetaminophen (TYLENOL) tablet 650 mg  650 mg Oral Q6H PRN Algernon Huxley, MD       Or  .  acetaminophen (TYLENOL) suppository 650 mg  650 mg Rectal Q6H PRN Algernon Huxley, MD      . alteplase (CATHFLO ACTIVASE) injection 2 mg  2 mg Intracatheter Once PRN Algernon Huxley, MD      . aspirin chewable tablet 81 mg  81 mg Per Tube Daily Samuel Mandes, MD   81 mg at 04/04/21 0946  . cefTRIAXone (ROCEPHIN) 1 g in sodium chloride 0.9 % 100 mL IVPB  1 g Intravenous Q24H Algernon Huxley, MD   Stopped at 04/02/21 1606  . Chlorhexidine Gluconate Cloth 2 % PADS 6 each  6 each Topical Q0600 Algernon Huxley, MD   6 each at 04/04/21 [phone]  . docusate (COLACE) 50 MG/5ML liquid 100 mg  100 mg Per Tube Daily  Samuel Mandes, MD   100 mg at 04/04/21 0946  . [START ON 04/05/2021] feeding supplement (NEPRO CARB STEADY) liquid 1,000 mL  1,000 mL Per Tube Q24H Samuel Mandes, MD 40 mL/hr at 04/04/21 0948 Rate Change at 04/04/21 0948  . guaiFENesin (ROBITUSSIN) 100 MG/5ML solution 200 mg  10 mL Oral Q6H PRN Algernon Huxley, MD      . heparin injection 1,000 Units  1,000 Units Dialysis PRN Algernon Huxley, MD      . heparin injection 5,000 Units  5,000 Units Subcutaneous Q8H Algernon Huxley, MD   5,000 Units at 04/04/21 [phone]  . ipratropium-albuterol (DUONEB) 0.5-2.5 (3) MG/3ML nebulizer solution 3 mL  3 mL Nebulization Q6H PRN Dew, Erskine Squibb, MD      . latanoprost (XALATAN) 0.005 % ophthalmic solution 1 drop  1 drop Both Eyes QHS Algernon Huxley, MD   1 drop at 04/02/21 2155  . lidocaine (PF) (XYLOCAINE) 1 % injection 5 mL  5 mL Intradermal PRN Algernon Huxley, MD      . lidocaine-prilocaine (EMLA) cream 1 application  1 application Topical PRN Algernon Huxley, MD      . mometasone-formoterol (DULERA) 200-5 MCG/ACT inhaler 2 puff  2 puff Inhalation BID Algernon Huxley, MD   2 puff at 04/01/21 [phone]  . norepinephrine (LEVOPHED) 55m in 2531mpremix infusion  0-40 mcg/min Intravenous Titrated PaFritzi MandesMD      . ondansetron (ZOFRAN) tablet 4 mg  4 mg Oral Q6H PRN DeAlgernon HuxleyMD       Or  . ondansetron (ZOFRAN) injection 4 mg  4 mg Intravenous Q6H PRN DeAlgernon HuxleyMD      . pentafluoroprop-tetrafluoroeth (GEBAUERS) aerosol 1 application  1 application Topical PRN DeAlgernon HuxleyMD      . sodium chloride flush (NS) 0.9 % injection 3 mL  3 mL Intravenous Q12H DeAlgernon HuxleyMD   3 mL at 04/04/21 1015  . sodium chloride flush (NS) 0.9 % injection 3 mL  3 mL Intravenous PRN DeAlgernon HuxleyMD      . timolol (TIMOPTIC) 0.5 % ophthalmic solution 1 drop  1 drop Both Eyes BID DeAlgernon HuxleyMD   1 drop at 04/02/21 2155    VITAL SIGNS: BP (!) 147/61 (BP Location: Left Arm)   Pulse 94   Temp 98.7 F (37.1 C) (Axillary)   Resp 20   Ht _0  (1.676  m)   Wt 60.9 kg   SpO2 100%   BMI 21.67 kg/m  Filed Weights   04/26/2021 1045 04/04/21 0500  Weight: 59 kg 60.9 kg    Estimated body mass index is 21.67 kg/m  as calculated from the following:   Height as of this encounter: _0  (1.676 m).   Weight as of this encounter: 60.9 kg.  LABS: CBC:    Component Value Date/Time   WBC 9.1 04/04/2021 0344   HGB 9.3 (L) 04/04/2021 0344   HGB 10.4 (L) 04/09/2016 1008   HCT 31.4 (L) 04/04/2021 0344   HCT 31.8 (L) 04/09/2016 1008   PLT 122 (L) 04/04/2021 0344   PLT 269 04/09/2016 1008   Comprehensive Metabolic Panel:    Component Value Date/Time   NA 144 04/04/2021 0344   NA 137 04/09/2016 1008   NA 137 04/26/2012 1145   K 4.9 04/04/2021 0344   K 4.0 04/26/2012 1145   BUN 70 (H) 04/04/2021 0344   BUN 19 04/09/2016 1008   BUN 16 04/26/2012 1145   CREATININE 3.07 (H) 04/04/2021 0344   CREATININE 1.65 (H) 12/06/2020 1028   ALBUMIN 2.5 (L) 04/04/2021 0344   ALBUMIN 3.5 04/09/2016 1008     Review of Systems  Unable to perform ROS: Mental status change    Physical Exam General: NAD, frail, cachectic, critically-ill appearing Cardiovascular: Tachycardic, irregular Pulmonary: rhonchi Abdomen: soft, nontender, + bowel sounds Extremities: no edema, no joint deformities Skin: no rashes, warm and dry Neurological: nonverbal, moaning, will not follow commands  Prognosis:POOR   Discharge Planning:  To Be Determined  Recommendations: . Full Code-as confirmed by family. They are planning to have a family meeting amongst themselves this afternoon and further discuss. Recommendations for DNR/DNI emphasized given patient's frailty.  . Continue with current plan of care  . Family is remaining hopeful for some improvement. Detailed discussion and education provided. Encouraged family as they navigate decisions to consider patient's quality of life and inability to return to baseline. [name]) is realistic in her understanding  and inquiring about hospice. Plans to lead and arrange family meeting.  Marland Kitchen PMT will continue to support and follow as needed. Please call team line with urgent needs.   Palliative Performance Scale: PPS 10-20%              Family expressed understanding and was in agreement with this plan.   Thank you for allowing the Palliative Medicine Team to assist in the care of this patient. Please utilize secure chat with additional questions, if there is no response within 30 minutes please call the above phone number.   Time In: 1305 Time Out: 1415 Time Total: 70 min.   Visit consisted of counseling and education dealing with the complex and emotionally intense issues of symptom management and palliative care in the setting of serious and potentially life-threatening illness.Greater than 50%  of this time was spent counseling and coordinating care related to the above assessment and plan.  Signed by:  Alda Lea, AGPCNP-BC Palliative Medicine Team  Phone: [phone] Pager: [phone] Amion: [place] Team providers are available by phone from 7am to 7pm daily and can be reached through the team cell phone.  Should this patient require assistance outside of these hours, please call the patient's attending physician.

## 2021-04-04 NOTE — Progress Notes (Signed)
Subjective: Had some hypotension yesterday, transferred to ICU.   Of note, he went to atrial fibrillation last night.  Exam: Vitals:   04/04/21 1026 04/04/21 1100  BP:  (!) 139/52  Pulse: 88 90  Resp: 17 19  Temp:    SpO2: 100% 100%   Gen: In bed, NAD Resp: non-labored breathing, no acute distress Abd: soft, nt  Neuro: MS: Obtunded, does  Not follow commands, but does partially open eyes to noxious stimulation.  CN: Pupils are reactive bilaterally, slightly eccentric on the left. Closes eys tightly bilaterally. Blinks to threat, but not consistently.  Motor: withdraws bilaterally to noxious stimulation, in both UE and LE Sensory:as above.   Pertinent Labs: BUN 60-> 70 -> 70 Cr 2.81 -> 3.17 -> 3.07   EEG: No evidence of seizure.   Impression: 87 yo M with metabolic encephalopathy in the setting of AKI, chronic hearing loss, shortness of breath. I suspect that his MS is explainable by his increasing BUN, and other evaluations have not revealed other causes.  At this point, I do not have any specific neurological interventions or testing.  I suspect that his mental status will be a very gradual improvement as his medical status improves.  I would expect his mental status to lag considerably behind improvement in labs.  Recommendations: 1) would consider anticoagulation for his atrial fibrillation 2) neurology will be available on an as-needed basis moving forward  Roland Rack, MD [facility] [phone]  If 7pm- 7am, please page neurology on call as listed in [place].

## 2021-04-04 NOTE — Progress Notes (Signed)
Initial Nutrition Assessment  DOCUMENTATION CODES:   Severe malnutrition in context of chronic illness  INTERVENTION:   Continue Nepro '@40ml'$ /hr  Free water flushes 17m q4 hours to maintain tube patency   Regimen provides 1728kcal/day, 78g/day protein and 8751mday of free water  Pt at high refeed risk; recommend monitor potassium, magnesium and phosphorus labs daily until stable  NUTRITION DIAGNOSIS:   Severe Malnutrition related to chronic illness as evidenced by severe fat depletion,severe muscle depletion. -new diagnosis   GOAL:   Patient will meet greater than or equal to 90% of their needs  -progressing with tube feeds   MONITOR:   Labs,Weight trends,TF tolerance,Skin,I & O's  ASSESSMENT:   [age].o. male with medical history significant for hypertension, diabetes mellitus with stage IV chronic kidney disease, coronary artery disease, COPD, HLD and ex-smoker who was sent to the emergency room from his primary care provider's office for evaluation of hypoxia.  Pt s/p dialysis catheter placement 6/6 Pt s/p fluoroscopy guided nasogastric tube placement 6/6   Pt transferred to the ICU overnight for hypotension; pressures improved today. Pt tolerating tube feeds well at goal rate. Pt with new Afib today. Nephrology holding on dialysis for now. Per chart, pt appears weight stable since admit.   Medications reviewed and include: aspirin, colace, heparin, NaCl '@100ml'$ /hr, ceftriaxone  Labs reviewed: BUN 70(H), creat 3.07(H), K 4.9 wnl, P 3.2 wnl, Mg 2.3 wnl Hgb 9.3(L), Hct 31.4(L)  NUTRITION - FOCUSED PHYSICAL EXAM:  Flowsheet Row Most Recent Value  Orbital Region Severe depletion  Upper Arm Region Moderate depletion  Thoracic and Lumbar Region Severe depletion  Buccal Region Severe depletion  Temple Region Severe depletion  Clavicle Bone Region Severe depletion  Clavicle and Acromion Bone Region Severe depletion  Scapular Bone Region Severe depletion  Dorsal Hand  Severe depletion  Patellar Region Severe depletion  Anterior Thigh Region Severe depletion  Posterior Calf Region Severe depletion  Edema (RD Assessment) None  Hair Reviewed  Eyes Reviewed  Mouth Reviewed  Skin Reviewed  Nails Reviewed     Diet Order:   Diet Order            Diet NPO time specified  Diet effective now                EDUCATION NEEDS:   No education needs have been identified at this time  Skin:  Skin Assessment: Reviewed RN Assessment  Last BM:  6/7- type 4  Height:   Ht Readings from Last 1 Encounters:  04/04/2021 '5\' 6"'$  (1.676 m)    Weight:   Wt Readings from Last 1 Encounters:  04/04/21 60.9 kg    Ideal Body Weight:  64.5 kg  BMI:  Body mass index is 21.67 kg/m.  Estimated Nutritional Needs:   Kcal:  1500-1700 kcal/d  Protein:  75-90 g/d  Fluid:  1.5L/d  CaKoleen DistanceS, RD, LDN Please refer to [place]or RD and/or RD on-call/weekend/after hours pager

## 2021-04-04 NOTE — Progress Notes (Signed)
NAME:  Samuel Biese., MRN:  [id number], DOB:  [birth year], LOS: 2 ADMISSION DATE:  04/25/2021  87 year old male presenting to [facility] ED on 04/05/2021 from primary care provider's office for evaluation of hypoxia.  Per ED documentation the patient's daughter states he fell to his knees 3 days prior to his admission trying to get into a chair.  He has complained of having intermittent episodes of dizziness and lightheadedness.  His daughter also reported he has had a productive cough with clear phlegm, shortness of breath and has become increasingly weaker with poor oral intake.  Daughter took the patient to see his PCP where he was found to be hypoxic with an SPO2 of 88%. ED course: Per documentation the patient denied chest pain, diaphoresis, palpitations, headache, abdominal pain/nausea.  He denied changes in bowel habits, urinary symptoms or any focal deficits.   He did admit to subjective fevers, patient continued to have hypoxia on room air and was placed on 2 L nasal cannula. Initial vitals: T 98.5, RR 18, NSR 62, BP 112/55 & SPO2 88% on room air. Significant labs: VBG-7.31/83/48/41.8, hyperkalemia at 5.3, Cl-97, serum CO2 33, BUN/Cr- 30/1.59, neutropenia at 3.8, troponin 74, lactic 0.8 > 2.2 > 1.6.             CT head without contrast negative for intracranial abnormality & CT chest without contrast showed unchanged scarring of the right lung base with elevation of the right hemidiaphragm and pleural calcifications of the right lung base. TRH consulted for admission to observation status due to acute need for oxygen.  Hospital course: Significant Hospital Events: Including procedures, antibiotic start and stop dates in addition to other pertinent events     04/01/2021-nephrology consulted in the setting of AKI on CKD 3a & hyperkalemia due to suspected ATN secondary to volume depletion.  Patient treated with IV fluids giving shifting agents for hyperkalemia. 04/02/2021-concerns for ongoing lethargy,  neurology consulted for additional work-up.  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  MRA head and neck revealed left carotid enterectomy and stable right carotid bifurcation without significant stenosis.  Chronic moderate to severe bilateral vertebral artery origin stenosis due to calcified clot but not significantly changed. 03/30/2021-decision made to proceed with hemodialysis, vascular placed HD cath.  While in dialysis before being hooked up to receive treatment patient's BP dropped to 69/36.  Plan for HD treatment aborted and patient transferred to ICU for peripheral vasopressors.  PCCM consulted due to vasopressor need.  6/7 frail thin nonverbal, tachycardic  Antibiotics Given (last 72 hours)    Date/Time Action Medication Dose Rate   04/02/21 1536 New Bag/Given  [Per pharmacist, Medication compatible to give along with sodium bicarbinate 150 mEq in dextrose 5%, 1,150 mL infusion]   cefTRIAXone (ROCEPHIN) 1 g in sodium chloride 0.9 % 100 mL IVPB 1 g 200 mL/hr        Interim History / Subjective:  Remains encephalopathic Unable to communicate Very agitated Prognosis is very poor        Objective   Blood pressure (!) 147/61, pulse 94, temperature 98.7 F (37.1 C), temperature source Axillary, resp. rate 20, height '5\' 6"'$  (1.676 m), weight 60.9 kg, SpO2 100 %.        Intake/Output Summary (Last 24 hours) at 04/04/2021 1324 Last data filed at 04/04/2021 1100 Gross per 24 hour  Intake 977.72 ml  Output 575 ml  Net 402.72 ml   [employer]  04/13/2021 1045 04/04/21 0500  Weight: 59 kg 60.9 kg      REVIEW OF SYSTEMS  PATIENT IS UNABLE TO PROVIDE COMPLETE REVIEW OF SYSTEMS DUE TO SEVERE CRITICAL ILLNESS AND TOXIC METABOLIC ENCEPHALOPATHY   PHYSICAL EXAMINATION:  GENERAL:critically ill appearing,  HEAD: Normocephalic, atraumatic.  EYES: Pupils equal, round, reactive to light.  No scleral  icterus.  MOUTH: Moist mucosal membrane. NECK: Supple. PULMONARY: +rhonchi,  CARDIOVASCULAR: S1 and S2. Regular rate and rhythm. No murmurs, rubs, or gallops.  GASTROINTESTINAL: Soft, nontender, -distended. Positive bowel sounds.  MUSCULOSKELETAL: No swelling, clubbing, or edema.  NEUROLOGIC: nonverbal, maoning SKIN:intact,warm,dry   Labs/imaging that I havepersonally reviewed  (right click and "Reselect all SmartList Selections" daily)        ASSESSMENT AND PLAN SYNOPSIS  87 yo AAM with acute and severe toxic metabolic encephalopathy with signs and symptoms of aspiration with renal failure progressive CKD stage 3, patient with advanced age and progressive decline, I think patient is in the beginning of dying process  Patient is this and frail Patient with signs and symptoms of failure to thrive High risk for cardiac arrest and death   CARDIAC FAILURE/SOB check ECHO    CARDIAC ICU monitoring   ACUTE KIDNEY INJURY/Renal Failure -continue Foley Catheter-assess need -Avoid nephrotoxic agents -Follow urine output, BMP -Ensure adequate renal perfusion, optimize oxygenation -Renal dose medications Follow up Nephrology recs   NEUROLOGY Acute toxic metabolic encephalopathy Follow up Neurology recs   SEPTIC SHOCK -use vasopressors to keep MAP>65 as needed   INFECTIOUS DISEASE -continue antibiotics as prescribed -follow up cultures  ENDO - ICU hypoglycemic\Hyperglycemia protocol -check FSBS per protocol   GI GI PROPHYLAXIS as indicated  NUTRITIONAL STATUS DIET-->TF's on hold due to ABD pain  Constipation protocol as indicated   ELECTROLYTES -follow labs as needed -replace as needed -pharmacy consultation and following     Best practice (right click and "Reselect all SmartList Selections" daily)  Diet:  NPO Pain/Anxiety/Delirium protocol (if indicated): No VAP protocol (if indicated): Not indicated DVT prophylaxis: Subcutaneous Heparin GI  prophylaxis: N/A Glucose control:  SSI No Central venous access:  N/A Arterial line:  N/A Foley:  Yes, and it is still needed Mobility:  bed rest  PT consulted: N/A Code Status:  full code Disposition: SD/ICU  Labs   CBC: Recent Labs  Lab 04/02/2021 1051 04/19/2021 1649 04/01/21 0532 04/04/21 0344  WBC 3.8* 4.2 7.3 9.1  HGB 11.3* 11.7* 13.2 9.3*  HCT 37.5* 39.9 45.3 31.4*  MCV 100.5* 102.6* 103.9* 103.3*  PLT 207 199 237 122*    Basic Metabolic Panel: Recent Labs  Lab 04/04/2021 1649 04/01/21 0532 04/01/21 0811 04/02/21 0913 04/02/21 1452 04/02/21 1930 04/12/2021 0505 04/10/2021 2133 04/04/21 0344  NA  --  140  --  141  --   --  143 142 144  K  --  6.0*   < > 6.3* 5.9* 6.2* 6.2* 5.4* 4.9  CL  --  96*  --  102  --   --  100 103 104  CO2  --  34*  --  28  --   --  35* 34* 35*  GLUCOSE  --  159*  --  99  --   --  248* 179* 174*  BUN  --  37*  --  60*  --   --  70* 72* 70*  CREATININE 1.52* 1.70*  --  2.81*  --   --  3.17* 3.30* 3.07*  CALCIUM  --  8.8*  --  8.1*  --   --  7.7* 7.8* 7.9*  MG 2.3  --   --   --   --   --   --  2.5* 2.3  PHOS  --   --   --   --   --   --   --  3.5 3.2   < > = values in this interval not displayed.   GFR: Estimated Creatinine Clearance: 13.2 mL/min (A) (by C-G formula based on SCr of 3.07 mg/dL (H)). Recent Labs  Lab 04/20/2021 1051 04/19/2021 1135 04/02/2021 1506 04/06/2021 1649 04/01/21 0532 04/01/21 1126 04/04/21 0344 04/04/21 1201  PROCALCITON  --   --   --   --   --  <0.10  --   --   WBC 3.8*  --   --  4.2 7.3  --  9.1  --   LATICACIDVEN  --  0.8 2.2*  --   --  1.6  --  1.1    Liver Function Tests: Recent Labs  Lab 04/27/2021 1051 04/07/2021 2133 04/04/21 0344  AST 17  --   --   ALT 11  --   --   ALKPHOS 49  --   --   BILITOT 0.7  --   --   PROT 7.3  --   --   ALBUMIN 3.6 2.7* 2.5*   No results for input(s): LIPASE, AMYLASE in the last 168 hours. Recent Labs  Lab 04/02/21 0919  AMMONIA 27    ABG    Component Value  Date/Time   HCO3 41.8 (H) 04/12/2021 1135   O2SAT 48.2 04/19/2021 1135     Coagulation Profile: No results for input(s): INR, PROTIME in the last 168 hours.  Cardiac Enzymes: No results for input(s): CKTOTAL, CKMB, CKMBINDEX, TROPONINI in the last 168 hours.  HbA1C: Hemoglobin A1C  Date/Time Value Ref Range Status  12/19/2020 09:25 AM 5.9 (A) 4.0 - 5.6 % Final   HbA1c, POC (controlled diabetic range)  Date/Time Value Ref Range Status  09/18/2019 09:44 AM 6.0 0.0 - 7.0 % Final   Hgb A1c MFr Bld  Date/Time Value Ref Range Status  03/18/2020 09:51 AM 6.0 (H) <5.7 % of total Hgb Final    Comment:    For someone without known diabetes, a hemoglobin  A1c value between 5.7% and 6.4% is consistent with prediabetes and should be confirmed with a  follow-up test. . For someone with known diabetes, a value <7% indicates that their diabetes is well controlled. A1c targets should be individualized based on duration of diabetes, age, comorbid conditions, and other considerations. . This assay result is consistent with an increased risk of diabetes. . Currently, no consensus exists regarding use of hemoglobin A1c for diagnosis of diabetes for children. .   08/21/2018 09:30 AM 6.5 (H) <5.7 % of total Hgb Final    Comment:    For someone without known diabetes, a hemoglobin A1c value of 6.5% or greater indicates that they may have  diabetes and this should be confirmed with a follow-up  test. . For someone with known diabetes, a value <7% indicates  that their diabetes is well controlled and a value  greater than or equal to 7% indicates suboptimal  control. A1c targets should be individualized based on  duration of diabetes, age, comorbid conditions, and  other considerations. . Currently, no consensus exists regarding use of hemoglobin A1c for diagnosis of diabetes for children. .     CBG: Recent Labs  Lab 04/16/2021 1859 04/19/2021 2057 04/04/21 0323 04/04/21 0726  04/04/21 1154  GLUCAP 126* 126* 104* 96 79    Allergies No Known Allergies     DVT/GI PRX  assessed I Assessed the need for Labs I Assessed the need for Foley I Assessed the need for Central Venous Line Family Discussion when available I Assessed the need for Mobilization I made an Assessment of medications to be adjusted accordingly Safety Risk assessment completed  CASE DISCUSSED IN MULTIDISCIPLINARY ROUNDS WITH ICU TEAM     Critical Care Time devoted to patient care services described in this note is 65  minutes.  Critical care was necessary to treat or prevent imminent or life-threatening deterioration. Overall, patient is critically ill, prognosis is guarded.  Patient with Multiorgan failure and at high risk for cardiac arrest and death.    Corrin Parker, M.D.  [facility]  Medical Director [place] Director [facility] Cardio-Pulmonary Department

## 2021-04-04 NOTE — Progress Notes (Signed)
Pt converted to a.fib rate 100-140's, MD notified. Order placed for lopressor 2.5 IV. Order carried out and pt's rate now in the 80's a.fib.

## 2021-04-05 ENCOUNTER — Other Ambulatory Visit: Payer: Self-pay

## 2021-04-05 ENCOUNTER — Inpatient Hospital Stay: Payer: Medicare PPO

## 2021-04-05 DIAGNOSIS — N184 Chronic kidney disease, stage 4 (severe): Secondary | ICD-10-CM

## 2021-04-05 DIAGNOSIS — E1122 Type 2 diabetes mellitus with diabetic chronic kidney disease: Secondary | ICD-10-CM

## 2021-04-05 DIAGNOSIS — Z66 Do not resuscitate: Secondary | ICD-10-CM

## 2021-04-05 LAB — CBC WITH DIFFERENTIAL/PLATELET
Abs Immature Granulocytes: 0.26 10*3/uL — ABNORMAL HIGH (ref 0.00–0.07)
Basophils Absolute: 0 10*3/uL (ref 0.0–0.1)
Basophils Relative: 0 %
Eosinophils Absolute: 0 10*3/uL (ref 0.0–0.5)
Eosinophils Relative: 0 %
HCT: 29.5 % — ABNORMAL LOW (ref 39.0–52.0)
Hemoglobin: 8.8 g/dL — ABNORMAL LOW (ref 13.0–17.0)
Immature Granulocytes: 2 %
Lymphocytes Relative: 3 %
Lymphs Abs: 0.4 10*3/uL — ABNORMAL LOW (ref 0.7–4.0)
MCH: 30.4 pg (ref 26.0–34.0)
MCHC: 29.8 g/dL — ABNORMAL LOW (ref 30.0–36.0)
MCV: 102.1 fL — ABNORMAL HIGH (ref 80.0–100.0)
Monocytes Absolute: 1 10*3/uL (ref 0.1–1.0)
Monocytes Relative: 8 %
Neutro Abs: 10.7 10*3/uL — ABNORMAL HIGH (ref 1.7–7.7)
Neutrophils Relative %: 87 %
Platelets: 126 10*3/uL — ABNORMAL LOW (ref 150–400)
RBC: 2.89 MIL/uL — ABNORMAL LOW (ref 4.22–5.81)
RDW: 15 % (ref 11.5–15.5)
WBC: 12.3 10*3/uL — ABNORMAL HIGH (ref 4.0–10.5)
nRBC: 2.8 % — ABNORMAL HIGH (ref 0.0–0.2)

## 2021-04-05 LAB — GLUCOSE, CAPILLARY
Glucose-Capillary: 150 mg/dL — ABNORMAL HIGH (ref 70–99)
Glucose-Capillary: 94 mg/dL (ref 70–99)
Glucose-Capillary: 58 mg/dL — ABNORMAL LOW (ref 70–99)
Glucose-Capillary: 75 mg/dL (ref 70–99)
Glucose-Capillary: 142 mg/dL — ABNORMAL HIGH (ref 70–99)
Glucose-Capillary: 129 mg/dL — ABNORMAL HIGH (ref 70–99)
Glucose-Capillary: 131 mg/dL — ABNORMAL HIGH (ref 70–99)

## 2021-04-05 LAB — MAGNESIUM: Magnesium: 2.5 mg/dL — ABNORMAL HIGH (ref 1.7–2.4)

## 2021-04-05 LAB — COMPREHENSIVE METABOLIC PANEL
ALT: 745 U/L — ABNORMAL HIGH (ref 0–44)
AST: 1167 U/L — ABNORMAL HIGH (ref 15–41)
Albumin: 2.4 g/dL — ABNORMAL LOW (ref 3.5–5.0)
Alkaline Phosphatase: 200 U/L — ABNORMAL HIGH (ref 38–126)
Anion gap: 13 (ref 5–15)
BUN: 76 mg/dL — ABNORMAL HIGH (ref 8–23)
CO2: 28 mmol/L (ref 22–32)
Calcium: 8.2 mg/dL — ABNORMAL LOW (ref 8.9–10.3)
Chloride: 107 mmol/L (ref 98–111)
Creatinine, Ser: 3.28 mg/dL — ABNORMAL HIGH (ref 0.61–1.24)
GFR, Estimated: 17 mL/min — ABNORMAL LOW (ref >60)
Glucose, Bld: 198 mg/dL — ABNORMAL HIGH (ref 70–99)
Potassium: 4.8 mmol/L (ref 3.5–5.1)
Sodium: 148 mmol/L — ABNORMAL HIGH (ref 135–145)
Total Bilirubin: 0.8 mg/dL (ref 0.3–1.2)
Total Protein: 5.4 g/dL — ABNORMAL LOW (ref 6.5–8.1)

## 2021-04-05 LAB — PHOSPHORUS: Phosphorus: 1.9 mg/dL — ABNORMAL LOW (ref 2.5–4.6)

## 2021-04-05 LAB — URINE CULTURE: Culture: NO GROWTH

## 2021-04-05 LAB — TROPONIN I (HIGH SENSITIVITY)
Troponin I (High Sensitivity): 4936 ng/L (ref <18)
Troponin I (High Sensitivity): 11863 ng/L (ref <18)
Troponin I (High Sensitivity): 18278 ng/L (ref <18)

## 2021-04-05 MED ORDER — DEXTROSE 50 % IV SOLN: 25.0000 g | Freq: Once | INTRAVENOUS | Status: AC

## 2021-04-05 MED ORDER — CHLORHEXIDINE GLUCONATE 0.12% ORAL RINSE (MEDLINE KIT)
15.0000 mL | Freq: Two times a day (BID) | OROMUCOSAL | Status: DC
  Administered 2021-04-05 – 2021-04-08 (×7): 15 mL via OROMUCOSAL

## 2021-04-05 MED ORDER — ROCURONIUM BROMIDE 50 MG/5ML IV SOLN
50.0000 mg | Freq: Once | INTRAVENOUS | Status: AC
  Administered 2021-04-05: 50 mg via INTRAVENOUS

## 2021-04-05 MED ORDER — ATROPINE SULFATE 1 MG/10ML IJ SOSY
PREFILLED_SYRINGE | INTRAMUSCULAR | Status: AC
  Filled 2021-04-05: qty 10

## 2021-04-05 MED ORDER — DEXTROSE-NACL 5-0.9 % IV SOLN: INTRAVENOUS | Status: DC

## 2021-04-05 MED ORDER — MIDODRINE HCL 5 MG PO TABS
5.0000 mg | ORAL_TABLET | Freq: Three times a day (TID) | ORAL | Status: DC
  Administered 2021-04-05 – 2021-04-08 (×10): 5 mg
  Filled 2021-04-05 (×10): qty 1

## 2021-04-05 MED ORDER — PROPOFOL 1000 MG/100ML IV EMUL
0.0000 ug/kg/min | INTRAVENOUS | Status: DC
  Administered 2021-04-05: 5 ug/kg/min via INTRAVENOUS
  Filled 2021-04-05: qty 100

## 2021-04-05 MED ORDER — ETOMIDATE 2 MG/ML IV SOLN
20.0000 mg | Freq: Once | INTRAVENOUS | Status: AC
  Administered 2021-04-05: 20 mg via INTRAVENOUS
  Filled 2021-04-05: qty 10

## 2021-04-05 MED ORDER — ACETAMINOPHEN 160 MG/5ML PO SOLN
650.0000 mg | Freq: Four times a day (QID) | ORAL | Status: DC | PRN
  Filled 2021-04-05: qty 20.3

## 2021-04-05 MED ORDER — ROCURONIUM BROMIDE 50 MG/5ML IV SOLN
INTRAVENOUS | Status: AC
  Filled 2021-04-05: qty 1

## 2021-04-05 MED ORDER — FENTANYL CITRATE (PF) 100 MCG/2ML IJ SOLN
25.0000 ug | Freq: Once | INTRAMUSCULAR | Status: AC
  Administered 2021-04-05: 25 ug via INTRAVENOUS
  Filled 2021-04-05: qty 2

## 2021-04-05 MED ORDER — SODIUM CHLORIDE 0.9 % IV BOLUS
500.0000 mL | Freq: Once | INTRAVENOUS | Status: AC
  Administered 2021-04-05: 500 mL via INTRAVENOUS

## 2021-04-05 MED ORDER — K PHOS MONO-SOD PHOS DI & MONO 155-852-130 MG PO TABS
500.0000 mg | ORAL_TABLET | Freq: Once | ORAL | Status: AC
  Administered 2021-04-05: 500 mg
  Filled 2021-04-05: qty 2

## 2021-04-05 MED ORDER — FENTANYL CITRATE (PF) 100 MCG/2ML IJ SOLN
100.0000 ug | Freq: Once | INTRAMUSCULAR | Status: AC
Start: 2021-04-05 — End: 2021-04-05
  Administered 2021-04-05: 100 ug via INTRAVENOUS

## 2021-04-05 MED ORDER — MIDODRINE HCL 5 MG PO TABS
5.0000 mg | ORAL_TABLET | Freq: Three times a day (TID) | ORAL | Status: DC
  Administered 2021-04-05: 5 mg via ORAL
  Filled 2021-04-05: qty 1

## 2021-04-05 MED ORDER — ORAL CARE MOUTH RINSE
15.0000 mL | OROMUCOSAL | Status: DC
  Administered 2021-04-05 – 2021-04-09 (×38): 15 mL via OROMUCOSAL

## 2021-04-05 MED ORDER — PHENYLEPHRINE CONCENTRATED 100MG/250ML (0.4 MG/ML) INFUSION SIMPLE
0.0000 ug/min | INTRAVENOUS | Status: DC
  Filled 2021-04-05: qty 250

## 2021-04-05 MED ORDER — FENTANYL 2500MCG IN NS 250ML (10MCG/ML) PREMIX INFUSION
0.0000 ug/h | INTRAVENOUS | Status: DC
  Administered 2021-04-05: 25 ug/h via INTRAVENOUS
  Administered 2021-04-06: 100 ug/h via INTRAVENOUS
  Administered 2021-04-07: 150 ug/h via INTRAVENOUS
  Administered 2021-04-08: 175 ug/h via INTRAVENOUS
  Administered 2021-04-09: 150 ug/h via INTRAVENOUS
  Filled 2021-04-05 (×5): qty 250

## 2021-04-05 MED ORDER — POLYETHYLENE GLYCOL 3350 17 G PO PACK
17.0000 g | PACK | Freq: Every day | ORAL | Status: DC
  Administered 2021-04-05 – 2021-04-08 (×4): 17 g
  Filled 2021-04-05 (×4): qty 1

## 2021-04-05 MED ORDER — GUAIFENESIN 100 MG/5ML PO SOLN
10.0000 mL | Freq: Four times a day (QID) | ORAL | Status: DC | PRN
  Filled 2021-04-05: qty 10

## 2021-04-05 MED ORDER — FENTANYL CITRATE (PF) 100 MCG/2ML IJ SOLN
INTRAMUSCULAR | Status: AC
  Administered 2021-04-05: 100 ug
  Filled 2021-04-05: qty 2

## 2021-04-05 MED ORDER — DEXTROSE 50 % IV SOLN
INTRAVENOUS | Status: AC
  Administered 2021-04-05: 25 g via INTRAVENOUS
  Filled 2021-04-05: qty 50

## 2021-04-05 MED FILL — Medication: Qty: 1 | Status: AC

## 2021-04-05 NOTE — Progress Notes (Signed)
Goals of Care  The Clinical status was relayed to family in detail. See CODE documentation.  Updated and notified of patients medical condition.  Patient remains unresponsive, intubated, mechanically ventilated and sedated.    Patient with Progressive multiorgan failure with very low chance of meaningful recovery despite all aggressive and optimal medical therapy.  Family understands the situation.  They have consented and agreed to DNR/DNI and would NOT wish to proceed with Trach or PEG.   Family are satisfied with Plan of action and management. All questions answered  Additional CC time 32 mins   Rufina Falco, DNP, FNP-C, AGACNP-BC Acute Care Nurse Practitioner  [place] [facility] Pager: [phone] [place] at [facility]

## 2021-04-05 NOTE — Progress Notes (Signed)
BRIEF PATIENT DESCRIPTION 87 y.o male admitted with Acute on chronic hypoxic & hypercapnic respiratory failureAcute kidney injury on chronic CKD stage III and Hyperkalemia. Due to worsening kidney function and persistent hyperkalemia after multiple shifting measures, decision made to proceed with hemodialysis, vascular placed HD cath.  While in dialysis before being hooked up to receive treatment patient's BP dropped to 69/36.  Plan for HD treatment aborted and patient transferred to ICU for peripheral vasopressors. He continued to be hypotensive and minimally responsive.  SIGNIFICANT EVENT 04/05/2021: Patient was noted to be bradycardic in the 30's but still with pulse. Few minutes later he went into asystole and CPR was initiated   CARDIOPULMONARY RESUSCITATION  Initial rhythm: Sinus Bradycardia progressed to asystole  CPR performance duration: 10 minutes  Was defibrillation or cardioversion used ? NO  Was external pacer placed ? NO  Was patient intubated pre/post CPR ? YES  Was transvenous pacer placed ? NO  Medications Administered Include:      Yes/no Amiodarone   Atropine  yes x 1  Calcium   Epinephrine Yes x 1  Lidocaine   Magnesium   Norepinephrine  yes  Phenylephrine   Sodium bicarbonate   Vasopression    Evaluation  Final Status - Was patient successfully resuscitated ? Yes  If successfully resuscitated - what is current rhythm ? Sinus Tachycardia If successfully resuscitated - what is current hemodynamic status ? Requires vasopressor   Assessment & Plan:  PEA Cardiac Arrest Circulatory shock PMHx: CAD, HTN, Afib - Initiating TTM: Does not meet criteria - Continue Vasopressors: levophed to maintain MAP > 70  - RASS goal -3, -5 if on NMB - Repeat ABG and CXR in am - Follow up EKG in am, Trend Troponin - continuous cardiac monitoring - STAT labs: CBC, BMP, Mg, Phos, Lactic, PT/ INR, troponin - replace electrolytes PRN - Strict I/O's: Goal UOP > 0.5  mL/kg/hr   Acute Hypoxic / Hypercapnic Respiratory Failure in the setting of Acute on Chronic Kidney Disease, Metabolic Encephalopathy and probable Aspiration? PMHx: COPD - Ventilator settings: PRVC  6 mL/kg, 60 FiO2, 5 PEEP, continue ventilator support & lung protective strategies - Wean PEEP & FiO2 as tolerated, maintain SpO2 > 90% - Head of bed elevated 30 degrees, VAP protocol in place - Plateau pressures less than 30 cm H20  - Intermittent chest x-ray & ABG PRN - Daily WUA with SBT as tolerated  - Ensure adequate pulmonary hygiene  - F/u cultures, trend PCT - Continue Ceftriaxone - Budesonide inhaler nebs BID, bronchodilators PRN - PAD protocol in place: continue Fentanyl & Propofol drip   Goals of Care:  Patient's Son at the bedside during the code  Summary of discussion:  The Clinical status was relayed to family in detail including cardiac arrest and subsequent interventions. Updated and notified of patients medical condition.  Family understands the situation. They would like the patient to remain a FULL CODE.  Family are satisfied with Plan of action and management. All questions answered    Additional CC time 45 mins    Rufina Falco, DNP, CCRN, FNP-C, AGACNP-BC Acute Care Nurse Practitioner  [place] [facility] Pager: [phone] [place] at [facility]

## 2021-04-05 NOTE — Progress Notes (Signed)
GOALS OF CARE DISCUSSION  The Clinical status was relayed to family in detail. Daughter [name] at bedside, Daughters and Son over the phone Updated and notified of patients medical condition.    Patient remains unresponsive and will not open eyes to command.   Patient is having a weak cough and struggling to remove secretions.   Patient with increased WOB and using accessory muscles to breathe Explained to family course of therapy and the modalities    Patient with Progressive multiorgan failure with a very high probablity of a very minimal chance of meaningful recovery despite all aggressive and optimal medical therapy.  PATIENT REMAINS DNR status  Family understands the situation. Patient with multiorgan failure Very poor prognosis Patient is suffering and active dying process   Family are satisfied with Plan of action and management. All questions answered  Additional CC time 45 mins   Angella Montas Patricia Pesa, M.D.  [facility]  Medical Director [place] Director [facility] Cardio-Pulmonary Department

## 2021-04-05 NOTE — Progress Notes (Signed)
Discussed 6am troponin with NP

## 2021-04-05 NOTE — Progress Notes (Signed)
   Daily Progress Note   Patient Name: Samuel Bryan.       Date: 04/05/2021 DOB: 30-Aug-1927  Age: 87 y.o. MRN#: [id number] Attending Physician: Flora Lipps, MD Primary Care Physician: Delsa Grana, PA-C Admit Date: 03/30/2021  Reason for Consultation/Follow-up: Establishing goals of care  Subjective: Chart Reviewed. Updates Received. Patient Assessed.   Patient now intubated and obtunded. Unfortunately went into cardiac arrest this morning requiring 10 mins of ACLS/CPR.   Daughter, [name] is at the bedside. Discussed patient's quality of life and recent events. She verbalized understanding. Family has agreed to DNR status which is appropriate.   Discussed patient remains critical with high risk of sudden death. Daughter is aware and expresses family's awareness that patient is suffering from multiorgan failure and chances of a meaningful recovery are poor.   Family is requesting to continue with treatment and watchful waiting.   Encouraged continued family discussions with a main focus on patient's quality of life, poor prognosis, and suffering. Daughter shares difficulty in seeing patient in current state knowing just a week ago he was awake, alert, and mowing lawns. Emotional support provided.   All questions answered support provided.    Length of Stay: 3 days  Vital Signs: BP (!) 118/52   Pulse 70   Temp (!) 97.1 F (36.2 C) (Axillary)   Resp 18   Ht '5\' 6"'$  (1.676 m)   Wt 60.9 kg   SpO2 100%   BMI 21.67 kg/m  SpO2: SpO2: 100 % O2 Device: O2 Device: Ventilator O2 Flow Rate: O2 Flow Rate (L/min): (S) 1 L/min  Physical Exam: Obtunded, intubated              Palliative Care Assessment & Plan  HPI: Palliative Care consult requested for goals of care discussion in this 87 y.o. male with a medical history significant for diabetes, CKD stage III, coronary artery disease, hypertension, PVD, and former smoker.  Patient presented to the ED with concerns for generalized  weakness.  Was reported patient fell 3 days prior to admission and also experiencing intermittent episodes of dizziness and changes in appetite.  Patient was seen at PCPs office and found to be hypoxic (88%) and encouraged further evaluation.  During work-up chest x-ray showed elevated right hemidiaphragm with right lower lobe atelectasis.  CT of head showed no acute abnormalities, mild atrophy and mild white matter ischemia.  CT of chest showed right lung scarring and pleural calcifications.  Patient recently transferred to ICU (04/26/2021) due to increased lethargy, hypotension, tachycardia, and renal failure.  He has been followed up by nephrology and neurology.  Code Status: DNR  Goals of Care/Recommendations: Suffered cardiac arrest this morning, now intubated, family made appropriate decision for DNR.  Ongoing family discussions regarding poor prognosis and no meaningful recovery.  PMT will continue to support and follow  Prognosis: POOR  Discharge Planning: Anticipated Hospital Death  Thank you for allowing the Palliative Medicine Team to assist in the care of this patient.  Time Total: 40 min.   Visit consisted of counseling and education dealing with the complex and emotionally intense issues of symptom management and palliative care in the setting of serious and potentially life-threatening illness.Greater than 50%  of this time was spent counseling and coordinating care related to the above assessment and plan.  Alda Lea, AGPCNP-BC  Palliative Medicine Team [phone]

## 2021-04-05 NOTE — Procedures (Signed)
Intubation Procedure Note  Samuel Bryan  [id number]  08/23/1927  Date:04/05/21  Time:3:53 AM   Provider Performing:Cesareo Vickrey [name]    Procedure: Intubation ([id number])  Indication(s) Respiratory Failure  Consent Unable to obtain consent due to emergent nature of procedure.   Anesthesia Etomidate, Fentanyl and Rocuronium   Time Out Verified patient identification, verified procedure, site/side was marked, verified correct patient position, special equipment/implants available, medications/allergies/relevant history reviewed, required imaging and test results available.   Sterile Technique Usual hand hygeine, masks, and gloves were used   Procedure Description Patient positioned in bed supine.  Sedation given as noted above.  Patient was intubated with endotracheal tube using Glidescope.  View was Grade 1 full glottis .  Number of attempts was 1.  Colorimetric CO2 detector was consistent with tracheal placement.   Complications/Tolerance None; patient tolerated the procedure well. Chest X-ray is ordered to verify placement.   EBL Minimal   Specimen(s) None   ETT secured at 25cm at the lip   Samuel Falco, DNP, CCRN, FNP-C, AGACNP-BC Acute Care Nurse Practitioner  [place] Pager: [phone] [place] at [facility]

## 2021-04-05 NOTE — Progress Notes (Signed)
NAME:  Samuel Bryan., MRN:  [id number], DOB:  Sep 15, 1927, LOS: 63  87 year old male presenting to [facility] ED on 04/23/2021 from primary care provider's office for evaluation of hypoxia.Per ED documentation the patient's daughter states he fell to his knees 3 days prior to his admission trying to get into a chair.He has complained of having intermittent episodes of dizziness and lightheadedness.His daughter also reported he has had a productive cough with clear phlegm,shortnessof breath and has becomeincreasingly weaker with poor oral intake.Daughter took the patient to see his PCP where he was found to be hypoxic with an SPO2 of 88%.  I think patient is in the beginning of dying process  Patient is this and frail Patient with signs and symptoms of failure to thrive High risk for cardiac arrest and death-This was relayed to family  Hospital course: Significant Hospital Events: Including procedures, antibiotic start and stop dates in addition to other pertinent events    04/01/2021-nephrology consulted in the setting of AKI on CKD 3a &hyperkalemia due to suspected ATN secondary to volume depletion.Patient treated with IV fluids giving shifting agents for hyperkalemia. 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 04/08/2021-decision made to proceed with hemodialysis,vascular placed HD cath.While in dialysis before being hooked up to receive treatment patient's BP dropped to 69/36.Plan for HD treatment aborted and patient transferred to ICU for peripheral vasopressors.  PCCM  consulted due to vasopressor need.  6/7 frail thin nonverbal, tachycardic 6/8 acute cardiac arrest ACLS/CPR 10 mins 6/8 intubated, patient was made DNR status     Interim History / Subjective:  multiorgan failure S/p cardiac arrest  +ischemic cardiomyopathy critically ill In the Dying process      Objective   Blood pressure (!) 95/46, pulse 92, temperature (!) 97.5 F (36.4 C), resp. rate (!) 33, height '5\' 6"'$  (1.676 m), weight 60.9 kg, SpO2 100 %.    Vent Mode: PRVC FiO2 (%):  [40 %-60 %] 40 % Set Rate:  [18 bmp-24 bmp] 18 bmp Vt Set:  [500 mL] 500 mL PEEP:  [5 cmH20] 5 cmH20   Intake/Output Summary (Last 24 hours) at 04/05/2021 [id number] Last data filed at 04/05/2021 0400 Gross per 24 hour  Intake 1774.03 ml  Output 1225 ml  Net 549.03 ml   Filed Weights   04/21/2021 1045 04/04/21 0500  Weight: 59 kg 60.9 kg      REVIEW OF SYSTEMS  PATIENT IS UNABLE TO PROVIDE COMPLETE REVIEW OF SYSTEMS DUE TO SEVERE CRITICAL ILLNESS AND TOXIC METABOLIC ENCEPHALOPATHY   PHYSICAL EXAMINATION:  GENERAL:critically ill appearing, +resp distress Thin and frail NECK: Supple. PULMONARY: +rhonchi, +wheezing CARDIOVASCULAR: S1 and S2. Regular rate and rhythm. No murmurs, rubs, or gallops.  GASTROINTESTINAL: Soft, nontender, -distended. Positive bowel sounds.  MUSCULOSKELETAL: No swelling, clubbing, or edema.  NEUROLOGIC: obtunded SKIN:intact,warm,dry   Labs/imaging that I havepersonally reviewed  (right click and "Reselect all SmartList Selections" daily)    ASSESSMENT AND PLAN SYNOPSIS  87 yo AAM with acute and severe toxic metabolic encephalopathy with signs and symptoms of aspiration with renal failure progressive CKD stage 3, patient with advanced age and progressive decline, I think patient is in the beginning of dying process, now with acute cardiac arrest with ischemic cardiomopathy  Severe ACUTE Hypoxic and Hypercapnic Respiratory Failure -continue Mechanical Ventilator  support -continue Bronchodilator Therapy -Wean Fio2 and PEEP as tolerated -VAP/VENT bundle implementation Will NOT be able to wean from vent  CARDIAC FAILURE/Cardiac arrest Acute ischemic cardiomyopathy -oxygen as needed -Lasix as tolerated -follow up cardiac enzymes as indicated   CARDIAC ICU monitoring   ACUTE KIDNEY INJURY/Renal Failure -continue Foley Catheter-assess need -Avoid nephrotoxic agents -Follow urine output, BMP -Ensure adequate renal perfusion, optimize oxygenation -Renal dose medications   NEUROLOGY Acute toxic metabolic encephalopathy, need for sedation Goal RASS -2 to -3   ENDO - ICU hypoglycemic\Hyperglycemia protocol -check FSBS per protocol   GI GI PROPHYLAXIS as indicated  NUTRITIONAL STATUS DIET-->TF's as tolerated Constipation protocol as indicated   ELECTROLYTES -follow labs as needed -replace as needed -pharmacy consultation and following    Best practice (right click and "Reselect all SmartList Selections" daily)  Diet:  NPO Pain/Anxiety/Delirium protocol (if indicated): YES VAP protocol (if indicated): YES DVT prophylaxis: Subcutaneous Heparin GI prophylaxis: N/A Glucose control:  SSI No Central venous access:  N/A Arterial line:  N/A Foley:  Yes, and it is still needed Mobility:  bed rest  PT consulted: N/A Code Status:  fDNR Disposition: ICU   Labs   CBC: Recent Labs  Lab 04/15/2021 1051 04/07/2021 1649 04/01/21 0532 04/04/21 0344 04/05/21 0614  WBC 3.8* 4.2 7.3 9.1 12.3*  NEUTROABS  --   --   --   --  10.7*  HGB 11.3* 11.7* 13.2 9.3* 8.8*  HCT 37.5* 39.9 45.3 31.4* 29.5*  MCV 100.5* 102.6* 103.9* 103.3* 102.1*  PLT 207 199 237 122* 126*    Basic Metabolic Panel: Recent Labs  Lab 04/17/2021 1649 04/01/21 0532 04/02/21 0913 04/02/21 1452 04/02/21 1930 04/10/2021 0505 04/22/2021 2133 04/04/21 0344 04/05/21 0614  NA  --    < > 141  --   --  143 142 144 148*  K  --    < > 6.3*   < > 6.2* 6.2* 5.4* 4.9  4.8  CL  --    < > 102  --   --  100 103 104 107  CO2  --    < > 28  --   --  35* 34* 35* 28  GLUCOSE  --    < > 99  --   --  248* 179* 174* 198*  BUN  --    < > 60*  --   --  70* 72* 70* 76*  CREATININE 1.52*   < > 2.81*  --   --  3.17* 3.30* 3.07* 3.28*  CALCIUM  --    < > 8.1*  --   --  7.7* 7.8* 7.9* 8.2*  MG 2.3  --   --   --   --   --  2.5* 2.3 2.5*  PHOS  --   --   --   --   --   --  3.5 3.2 1.9*   < > = values in this interval not displayed.   GFR: Estimated Creatinine Clearance: 12.4 mL/min (A) (by C-G formula based on SCr of 3.28 mg/dL (H)). Recent Labs  Lab 04/15/2021 1135 04/18/2021 1506 04/08/2021 1649 04/01/21 0532 04/01/21 1126 04/04/21 0344 04/04/21 1201 04/05/21 0614  PROCALCITON  --   --   --   --  <0.10  --   --   --   WBC  --   --  4.2 7.3  --  9.1  --  12.3*  LATICACIDVEN 0.8 2.2*  --   --  1.6  --  1.1  --     Liver Function Tests: Recent Labs  Lab 04/22/2021 1051 04/02/2021 2133 04/04/21 0344 04/05/21 0614  AST 17  --   --  1,167*  ALT 11  --   --  745*  ALKPHOS 49  --   --  200*  BILITOT 0.7  --   --  0.8  PROT 7.3  --   --  5.4*  ALBUMIN 3.6 2.7* 2.5* 2.4*   No results for input(s): LIPASE, AMYLASE in the last 168 hours. Recent Labs  Lab 04/02/21 0919  AMMONIA 27    ABG    Component Value Date/Time   PHART 7.50 (H) 04/05/2021 0330   PCO2ART 32 04/05/2021 0330   PO2ART 161 (H) 04/05/2021 0330   HCO3 25.0 04/05/2021 0330   O2SAT 99.6 04/05/2021 0330     Coagulation Profile: No results for input(s): INR, PROTIME in the last 168 hours.  Cardiac Enzymes: No results for input(s): CKTOTAL, CKMB, CKMBINDEX, TROPONINI in the last 168 hours.  HbA1C: Hemoglobin A1C  Date/Time Value Ref Range Status  12/19/2020 09:25 AM 5.9 (A) 4.0 - 5.6 % Final   HbA1c, POC (controlled diabetic range)  Date/Time Value Ref Range Status  09/18/2019 09:44 AM 6.0 0.0 - 7.0 % Final   Hgb A1c MFr Bld  Date/Time Value Ref Range Status  03/18/2020 09:51 AM 6.0  (H) <5.7 % of total Hgb Final    Comment:    For someone without known diabetes, a hemoglobin  A1c value between 5.7% and 6.4% is consistent with prediabetes and should be confirmed with a  follow-up test. . For someone with known diabetes, a value <7% indicates that their diabetes is well controlled. A1c targets should be individualized based on duration of diabetes, age, comorbid conditions, and other considerations. . This assay result is consistent with an increased risk of diabetes. . Currently, no consensus exists regarding use of hemoglobin A1c for diagnosis of diabetes for children. .   08/21/2018 09:30 AM 6.5 (H) <5.7 % of total Hgb Final    Comment:    For someone without known diabetes, a hemoglobin A1c value of 6.5% or greater indicates that they may have  diabetes and this should be confirmed with a follow-up  test. . For someone with known diabetes, a value <7% indicates  that their diabetes is well controlled and a value  greater than or equal to 7% indicates suboptimal  control. A1c targets should be individualized based on  duration of diabetes, age, comorbid conditions, and  other considerations. . Currently, no consensus exists regarding use of hemoglobin A1c for diagnosis of diabetes for children. .     CBG: Recent Labs  Lab 04/04/21 1154 04/04/21 1624 04/04/21 1956 04/04/21 2349 04/05/21 0354  GLUCAP 79 68* 144* 145* 150*    Allergies No Known Allergies     DVT/GI PRX  assessed I Assessed the need for Labs I Assessed the need for Foley I Assessed the need for Central Venous Line Family Discussion when available I Assessed the need for Mobilization I made an Assessment of medications to be adjusted accordingly Safety Risk assessment completed  CASE DISCUSSED IN MULTIDISCIPLINARY ROUNDS WITH ICU TEAM     Critical Care Time devoted to patient care services described in this note is 45 minutes.  Critical care was necessary to  treat or prevent imminent or life-threatening deterioration. Overall, patient is  critically ill, prognosis is guarded.  Patient with Multiorgan failure and at high risk for cardiac arrest and death.    Corrin Parker, M.D.  [facility]  Medical Director [place] Director [facility] Cardio-Pulmonary Department

## 2021-04-05 NOTE — Progress Notes (Signed)
[place], [place] 04/05/21  Subjective:   Hospital day # 3   Patient known to our practice from outpatient follow-up.  Information obtained from outpatient chart as well as review of inpatient chart and patient's daughter.  She reports that patient was in his usual state of health which include he is independent, able to mow his yard and make some meals for himself.  3 days prior to admission, he fell on his knees.  He has been complaining of intermittent dizziness lightheadedness.  He had a cough, with clear sputum.  He also reported loose stools for 2 to 3 days prior to admission.  Found to have acute kidney injury with increased creatinine of 1.70->2.8 and hyperkalemia.  Nephrology consult requested for evaluation.    Overnight patient's condition decompensated and had CPR/ACLS, then intubated Currently he is on ventilator His daughter, [name] is on the bedside Currently sedated with fentanyl and propofol Ventilator assisted, FiO2 30% Cardiovascular: Tachycardic, irregular, requiring norepinephrine to support blood pressure GI: NG tube in place  Renal: 06/07 0701 - 06/08 0700 In: 1774 [I.V.:1464; NG/GT:280] Out: 1225 [Urine:1225] Lab Results  Component Value Date   CREATININE 3.28 (H) 04/05/2021   CREATININE 3.07 (H) 04/04/2021   CREATININE 3.30 (H) 04/27/2021     Objective:  Vital signs in last 24 hours:  Temp:  [97.5 F (36.4 C)-98.7 F (37.1 C)] 98.1 F (36.7 C) (06/08 0800) Pulse Rate:  [72-108] 92 (06/07 2300) Resp:  [17-33] 33 (06/07 2300) BP: (95-148)/(45-76) 95/46 (06/07 2300) SpO2:  [90 %-100 %] 100 % (06/08 0305) FiO2 (%):  [40 %-60 %] 40 % (06/08 0438)  Weight change:  Filed Weights   04/11/2021 1045 04/04/21 0500  Weight: 59 kg 60.9 kg    Intake/Output:    Intake/Output Summary (Last 24 hours) at 04/05/2021 0901 Last data filed at 04/05/2021 0757 Gross per 24 hour  Intake 1512.03 ml  Output 937 ml  Net 575.03 ml      Physical Exam: General:  Frail, elderly, laying in the bed, temporal wasting  HEENT  dry oral mucous membranes, NG tube in place  Pulm/lungs  ventilator assisted  CVS/Heart  no rub, irregular, tachycardic  Abdomen:   Somewhat firm in consistency, nontender  Extremities:  No peripheral edema  Neurologic:  Sedated  Skin:  No acute rashes   Foley catheter in place   Right femoral dialysis catheter in place    Basic Metabolic Panel:  Recent Labs  Lab 04/25/2021 1649 04/01/21 0532 04/02/21 0913 04/02/21 1452 04/02/21 1930 04/16/2021 0505 04/08/2021 2133 04/04/21 0344 04/05/21 0614  NA  --    < > 141  --   --  143 142 144 148*  K  --    < > 6.3*   < > 6.2* 6.2* 5.4* 4.9 4.8  CL  --    < > 102  --   --  100 103 104 107  CO2  --    < > 28  --   --  35* 34* 35* 28  GLUCOSE  --    < > 99  --   --  248* 179* 174* 198*  BUN  --    < > 60*  --   --  70* 72* 70* 76*  CREATININE 1.52*   < > 2.81*  --   --  3.17* 3.30* 3.07* 3.28*  CALCIUM  --    < > 8.1*  --   --  7.7* 7.8*  7.9* 8.2*  MG 2.3  --   --   --   --   --  2.5* 2.3 2.5*  PHOS  --   --   --   --   --   --  3.5 3.2 1.9*   < > = values in this interval not displayed.     CBC: Recent Labs  Lab 04/11/2021 1051 04/11/2021 1649 04/01/21 0532 04/04/21 0344 04/05/21 0614  WBC 3.8* 4.2 7.3 9.1 12.3*  NEUTROABS  --   --   --   --  10.7*  HGB 11.3* 11.7* 13.2 9.3* 8.8*  HCT 37.5* 39.9 45.3 31.4* 29.5*  MCV 100.5* 102.6* 103.9* 103.3* 102.1*  PLT 207 199 237 122* 126*      Lab Results  Component Value Date   HEPBSAG NON REACTIVE 04/23/2021   HEPBSAB NON REACTIVE 04/06/2021      Microbiology:  Recent Results (from the past 240 hour(s))  Resp Panel by RT-PCR (Flu A&B, Covid) Nasopharyngeal Swab     Status: None   Collection Time: 04/07/2021 11:35 AM   Specimen: Nasopharyngeal Swab; Nasopharyngeal(NP) swabs in vial transport medium  Result Value Ref Range Status   SARS Coronavirus 2 by RT PCR NEGATIVE NEGATIVE Final     Comment: (NOTE) SARS-CoV-2 target nucleic acids are NOT DETECTED.  The SARS-CoV-2 RNA is generally detectable in upper respiratory specimens during the acute phase of infection. The lowest concentration of SARS-CoV-2 viral copies this assay can detect is 138 copies/mL. A negative result does not preclude SARS-Cov-2 infection and should not be used as the sole basis for treatment or other patient management decisions. A negative result may occur with  improper specimen collection/handling, submission of specimen other than nasopharyngeal swab, presence of viral mutation(s) within the areas targeted by this assay, and inadequate number of viral copies(<138 copies/mL). A negative result must be combined with clinical observations, patient history, and epidemiological information. The expected result is Negative.  Fact Sheet for Patients:  EntrepreneurPulse.com.au  Fact Sheet for Healthcare Providers:  IncredibleEmployment.be  This test is no t yet approved or cleared by the [place] FDA and  has been authorized for detection and/or diagnosis of SARS-CoV-2 by FDA under an Emergency Use Authorization (EUA). This EUA will remain  in effect (meaning this test can be used) for the duration of the COVID-19 declaration under Section 564(b)(1) of the Act, 21 U.S.C.section 360bbb-3(b)(1), unless the authorization is terminated  or revoked sooner.       Influenza A by PCR NEGATIVE NEGATIVE Final   Influenza B by PCR NEGATIVE NEGATIVE Final    Comment: (NOTE) The Xpert Xpress SARS-CoV-2/FLU/RSV plus assay is intended as an aid in the diagnosis of influenza from Nasopharyngeal swab specimens and should not be used as a sole basis for treatment. Nasal washings and aspirates are unacceptable for Xpert Xpress SARS-CoV-2/FLU/RSV testing.  Fact Sheet for Patients: EntrepreneurPulse.com.au  Fact Sheet for Healthcare  Providers: IncredibleEmployment.be  This test is not yet approved or cleared by the [place] FDA and has been authorized for detection and/or diagnosis of SARS-CoV-2 by FDA under an Emergency Use Authorization (EUA). This EUA will remain in effect (meaning this test can be used) for the duration of the COVID-19 declaration under Section 564(b)(1) of the Act, 21 U.S.C. section 360bbb-3(b)(1), unless the authorization is terminated or revoked.  Performed at [facility], [address], [place], [place] [address]   MRSA PCR Screening     Status: None  Collection Time: 04/18/2021  9:04 PM   Specimen: Nasal Mucosa; Nasopharyngeal  Result Value Ref Range Status   MRSA by PCR NEGATIVE NEGATIVE Final    Comment:        The GeneXpert MRSA Assay (FDA approved for NASAL specimens only), is one component of a comprehensive MRSA colonization surveillance program. It is not intended to diagnose MRSA infection nor to guide or monitor treatment for MRSA infections. Performed at [facility], [address] [place], [place] [address]   Urine Culture     Status: None   Collection Time: 04/13/2021  9:09 PM   Specimen: Urine, Random  Result Value Ref Range Status   Specimen Description   Final    URINE, RANDOM Performed at [facility], [address] [place], [place] [address]    Special Requests   Final    NONE Performed at [facility], [address], [place], [place] [address]    Culture   Final    NO GROWTH Performed at [facility], [place] [address] [place], [place] [address]    Report Status 04/05/2021 FINAL  Final    Coagulation Studies: No results for input(s): LABPROT, INR in the last 72 hours.  Urinalysis: Recent Labs    04/02/21 2110  COLORURINE YELLOW*  LABSPEC 1.012  PHURINE 5.0  GLUCOSEU NEGATIVE  HGBUR SMALL*  BILIRUBINUR NEGATIVE  KETONESUR NEGATIVE  PROTEINUR NEGATIVE  NITRITE NEGATIVE   LEUKOCYTESUR NEGATIVE      Imaging: DG Chest 1 View  Result Date: 04/05/2021 CLINICAL DATA:  Intubation and nasogastric tube placement EXAM: CHEST  1 VIEW COMPARISON:  04/04/2021 FINDINGS: Endotracheal tube with tip just below the clavicular heads. The enteric tube reaches the stomach. Extensive artifact from EKG leads. Lower volume chest with indistinct infiltrates at the bases. There is elevation of the lateral right diaphragm scar-like appearance in this location by CT. Normal heart size. No visible pneumothorax. IMPRESSION: 1. Unremarkable endotracheal and orogastric tube positioning. 2. Worsening aeration with infiltrates at the bases. Electronically Signed   By: Monte Fantasia M.D.   On: 04/05/2021 04:13   DG Abd 1 View  Result Date: 04/05/2021 CLINICAL DATA:  Nasogastric tube placement EXAM: ABDOMEN - 1 VIEW COMPARISON:  04/04/2021 FINDINGS: Nasogastric tube tip is seen overlying the expected proximal body of the stomach. Right common femoral hemodialysis catheter tip is seen at the expected confluence of the a inferior vena cava and renal veins. Normal abdominal gas pattern. No gross free intraperitoneal gas. Vascular stents are seen within the common iliac arteries bilaterally. No acute bone abnormality. IMPRESSION: Nasogastric tube tip within the proximal body of the stomach. Electronically Signed   By: Fidela Salisbury MD   On: 04/05/2021 04:14   DG Abd 1 View  Result Date: 04/04/2021 CLINICAL DATA:  Nasogastric tube placed yesterday, abdominal pain EXAM: ABDOMEN - 1 VIEW COMPARISON:  None FINDINGS: Tip of nasogastric tube projects over proximal stomach. Curvilinear metallic foreign body projects LEFT paraspinal at G95, uncertain etiology. RIGHT femoral dual lumen central venous catheter tip projecting over SVC at L2. Retained contrast in colon. Scattered stool in colon to rectum. No bowel dilatation or bowel wall thickening. Osseous demineralization. IMPRESSION: No acute abnormalities.  Electronically Signed   By: Lavonia Dana M.D.   On: 04/04/2021 14:52   PERIPHERAL VASCULAR CATHETERIZATION  Result Date: 04/08/2021 See surgical note for result.  DG Abd Portable 1V  Result Date: 04/16/2021 CLINICAL DATA:  Nasogastric tube placement EXAM: PORTABLE  ABDOMEN - 1 VIEW COMPARISON:  None. FINDINGS: Nasogastric tube is coiled in the upper esophagus with the tip directed superiorly and not seen. There is no bowel dilatation or air-fluid level to suggest bowel obstruction. No free air. There is apparent atelectasis left lower lobe. There is aortic atherosclerosis. IMPRESSION: Nasogastric tube coiled in upper esophagus with tip directed superiorly. No bowel obstruction or free air. Left lower lobe atelectatic change. Aortic Atherosclerosis (ICD10-I70.0). These results will be called to the ordering clinician or representative by the Radiologist Assistant, and communication documented in the PACS or [employer]. Electronically Signed   By: Lowella Grip III M.D.   On: 04/20/2021 11:41   DG Naso G Tube Plc W/Fl W/Rad  Result Date: 04/08/2021 CLINICAL DATA:  NG tube placement for feeding. EXAM: NASO G TUBE PLACEMENT WITH FL AND WITH RAD CONTRAST:  None. FLUOROSCOPY TIME:  Fluoroscopy Time:  0 minutes 9 seconds Radiation Exposure Index (if provided by the fluoroscopic device): 11.1 mGy Number of Acquired Spot Images: 1 COMPARISON:  None. FINDINGS: Nasogastric tube placed via right nostril and advanced to the stomach on first attempt with tip and side-port over the stomach in the left upper quadrant confirmed by fluoroscopic evaluation. Patient tolerated procedure well as the tube was secured and patient sent back to the ward in stable condition. IMPRESSION: Successful NG tube placement with tip and side-port over the stomach in the left upper quadrant. Electronically Signed   By: Marin Olp M.D.   On: 04/10/2021 16:06   DG Bone Survey Met  Result Date: 04/26/2021 CLINICAL DATA:  87 year old  with elevated PSA. EXAM: METASTATIC BONE SURVEY COMPARISON:  Recent head and chest CTs. FINDINGS: No evidence of blastic osseous lesion. No destructive lytic lesion. Enteric tube is in place with tip and side-port in the stomach. Right femoral catheter is in place. Multiple vascular stents in the abdomen. Retrocardiac and left lung base hazy opacity are new from prior exam. Stable right lung base scarring. Age related degenerative change in the spine. Calvarial calcification is pineal in recent head CT. IMPRESSION: 1. No evidence of focal osseous lesion. Please note, nuclear medicine bone scan may be considered if there is clinical concern for prostate metastasis. 2. Hazy left lung base opacity is new from recent chest CT and may represent airspace disease/atelectasis or pleural fluid. Electronically Signed   By: Keith Rake M.D.   On: 04/13/2021 16:32   EEG adult  Result Date: 04/02/2021 Greta Doom, MD     04/04/2021  9:29 AM History: 87 yo with AMS Sedation: none Technique: This is a 21 channel routine scalp EEG performed at the bedside with bipolar and monopolar montages arranged in accordance to the international 10/20 system of electrode placement. One channel was dedicated to EKG recording. Background: The background consists predominantly of 5 - _0  theta activity with some irregular delta range activity superimposed as well.  There is occasionally visible a posterior dominant rhythm(PDR) of 7 to 8 Hz.  There is no evolution or other aspects concerning for ictal activity. There are also frequent bifrontally predominant discharges with triphasic morphology.  These are at times periodic. Photic stimulation: Physiologic driving is present EEG Abnormalities: 1) triphasic waves 2) diffuse slow activity 3) slow PDR Clinical Interpretation: This EEG is consistent with a generalized nonspecific cerebral dysfunction (encephalopathy). There was no seizure or seizure predisposition recorded on this  study. Please note that lack of epileptiform activity on EEG does not preclude the possibility of epilepsy. Roland Rack,  MD [facility] [phone] If 7pm- 7am, please page neurology on call as listed in [place].   ECHOCARDIOGRAM COMPLETE  Result Date: 04/04/2021    ECHOCARDIOGRAM REPORT   Patient Name:   Kary Colaizzi. Date of Exam: 04/04/2021 Medical Rec #:  [id number]         Height:       66.0 in Accession #:    [id number]        Weight:       134.3 lb Date of Birth:  Apr 23, 1927         BSA:          1.688 m Patient Age:    71 years          BP:           147/61 mmHg Patient Gender: M                 HR:           94 bpm. Exam Location:  [facility] Procedure: 2D Echo, Color Doppler and Cardiac Doppler Indications:     Acute Respiratory distress R06.03  History:         Patient has no prior history of Echocardiogram examinations.                  Risk Factors:Diabetes and Hypertension. PVD.  Sonographer:     Sherrie Sport RDCS (AE) Referring Phys:  [id number] Flora Lipps Diagnosing Phys: Isaias Cowman MD  Sonographer Comments: No subcostal window, no parasternal window and Technically challenging study due to limited acoustic windows. Image acquisition challenging due to patient body habitus. IMPRESSIONS  1. Left ventricular ejection fraction, by estimation, is 60 to 65%. The left ventricle has normal function. The left ventricle has no regional wall motion abnormalities. Left ventricular diastolic parameters are indeterminate.  2. Right ventricular systolic function is normal. The right ventricular size is normal.  3. The mitral valve is normal in structure. Mild mitral valve regurgitation. No evidence of mitral stenosis.  4. The aortic valve is normal in structure. Aortic valve regurgitation is not visualized. No aortic stenosis is present.  5. The inferior vena cava is normal in size with greater than 50% respiratory variability, suggesting right atrial pressure of 3 mmHg. FINDINGS  Left  Ventricle: Left ventricular ejection fraction, by estimation, is 60 to 65%. The left ventricle has normal function. The left ventricle has no regional wall motion abnormalities. The left ventricular internal cavity size was normal in size. There is  no left ventricular hypertrophy. Left ventricular diastolic parameters are indeterminate. Right Ventricle: The right ventricular size is normal. No increase in right ventricular wall thickness. Right ventricular systolic function is normal. Left Atrium: Left atrial size was normal in size. Right Atrium: Right atrial size was normal in size. Pericardium: There is no evidence of pericardial effusion. Mitral Valve: The mitral valve is normal in structure. Mild mitral valve regurgitation. No evidence of mitral valve stenosis. Tricuspid Valve: The tricuspid valve is normal in structure. Tricuspid valve regurgitation is mild . No evidence of tricuspid stenosis. Aortic Valve: The aortic valve is normal in structure. Aortic valve regurgitation is not visualized. No aortic stenosis is present. Aortic valve mean gradient measures 2.0 mmHg. Aortic valve peak gradient measures 2.9 mmHg. Aortic valve area, by VTI measures 3.75 cm. Pulmonic Valve: The pulmonic valve was normal in structure. Pulmonic valve regurgitation is not visualized. No evidence of pulmonic stenosis. Aorta: The aortic root is normal in size and structure.  Venous: The inferior vena cava is normal in size with greater than 50% respiratory variability, suggesting right atrial pressure of 3 mmHg. IAS/Shunts: No atrial level shunt detected by color flow Doppler.  LEFT VENTRICLE PLAX 2D LVIDd:         2.91 cm LVIDs:         1.61 cm LV PW:         1.31 cm LV IVS:        1.12 cm LVOT diam:     2.00 cm LV SV:         41 LV SV Index:   24 LVOT Area:     3.14 cm  RIGHT VENTRICLE RV S prime:     13.30 cm/s TAPSE (M-mode): 3.5 cm LEFT ATRIUM             Index       RIGHT ATRIUM           Index LA diam:        3.30 cm 1.95  cm/m  RA Area:     13.00 cm LA Vol (A2C):   71.2 ml 42.17 ml/m RA Volume:   27.70 ml  16.41 ml/m LA Vol (A4C):   53.9 ml 31.93 ml/m LA Biplane Vol: 65.1 ml 38.56 ml/m  AORTIC VALVE AV Area (Vmax):    2.64 cm AV Area (Vmean):   2.84 cm AV Area (VTI):     3.75 cm AV Vmax:           84.60 cm/s AV Vmean:          57.300 cm/s AV VTI:            0.109 m AV Peak Grad:      2.9 mmHg AV Mean Grad:      2.0 mmHg LVOT Vmax:         71.10 cm/s LVOT Vmean:        51.800 cm/s LVOT VTI:          0.130 m LVOT/AV VTI ratio: 1.19  AORTA Ao Root diam: 3.30 cm MITRAL VALVE MV Area (PHT): 3.65 cm    SHUNTS MV Decel Time: 208 msec    Systemic VTI:  0.13 m MV E velocity: 99.40 cm/s  Systemic Diam: 2.00 cm Isaias Cowman MD Electronically signed by Isaias Cowman MD Signature Date/Time: 04/04/2021/4:14:56 PM    Final      Medications:   . sodium chloride    . sodium chloride 100 mL/hr at 04/05/21 0813  . sodium chloride    . sodium chloride    . cefTRIAXone (ROCEPHIN)  IV 1 g (04/04/21 1524)  . fentaNYL infusion INTRAVENOUS 25 mcg/hr (04/05/21 0734)  . norepinephrine (LEVOPHED) Adult infusion 8 mcg/min (04/05/21 0445)  . phenylephrine (NEO-SYNEPHRINE) Adult infusion Stopped (04/05/21 0405)  . propofol (DIPRIVAN) infusion 5 mcg/kg/min (04/05/21 0757)   . aspirin  81 mg Per Tube Daily  . Chlorhexidine Gluconate Cloth  6 each Topical Q0600  . docusate  100 mg Per Tube Daily  . feeding supplement (NEPRO CARB STEADY)  1,000 mL Per Tube Q24H  . heparin injection (subcutaneous)  5,000 Units Subcutaneous Q8H  . latanoprost  1 drop Both Eyes QHS  . midodrine  5 mg Per Tube TID WC  . mometasone-formoterol  2 puff Inhalation BID  . polyethylene glycol  17 g Per Tube Daily  . sodium chloride flush  3 mL Intravenous Q12H  . timolol  1 drop Both Eyes BID  sodium chloride, sodium chloride, sodium chloride, acetaminophen (TYLENOL) oral liquid 160 mg/5 mL, acetaminophen **OR** acetaminophen, alteplase,  guaiFENesin, heparin, ipratropium-albuterol, lidocaine (PF), lidocaine-prilocaine, ondansetron **OR** ondansetron (ZOFRAN) IV, pentafluoroprop-tetrafluoroeth, sodium chloride flush  Assessment/ Plan:  87 y.o. male with diabetes, hypertension, chronic kidney disease, coronary artery disease, ex-smoker, history of elevated PSA admitted on 04/18/2021 for Acute respiratory failure (HCC) [J96.00] Hypoxia [R09.02] Acute respiratory failure with hypoxia (HCC) [J96.01]   #Acute kidney injury on Chronic kidney disease stage IIIa Patient has underlying chronic kidney disease.  Risk factors include hypertension, atherosclerosis, advanced age.  Baseline creatinine of 1.6, GFR 49 from February 2022 AKI likely secondary to volume depletion and ATN. Renal ultrasound shows small kidneys with echogenicity  Urinalysis 0-5 RBCs, specific gravity of 1.012, negative for nitrites and leukocytes  Fair urine output yesterday, electrolytes and volume status are acceptable. No acute indication for dialysis Continue supportive care   #Acute respiratory failure Now ventilator assisted and requiring pressors  Overall prognosis appears very poor   LOS: [place] 6/8/20229:01 AM  [place], [place]

## 2021-04-05 NOTE — Progress Notes (Signed)
  Chaplain On-Call was paged by the Administrative Coordinator Marcello Moores, who reported that the patient has now been intubated after cardiac and respiratory distress.  A/C Marcello Moores also stated that a family member of the patient is at the bedside.  Chaplain met the patient's son Samuel Bryan, who stated that he was asleep in the patient's room when awakened by the distress of the patient.  Samuel Bryan also described the current health challenges for his father, including kidney failure and difficulty breathing.  Chaplain provided spiritual and emotional support for Samuel Bryan, and prayer at the bedside.  Chaplains are available for further support as needed.  Chaplain Pollyann Samples M.Div., [facility]

## 2021-04-05 NOTE — Progress Notes (Signed)
Nutrition Follow Up Note   DOCUMENTATION CODES:   Severe malnutrition in context of chronic illness  INTERVENTION:   Once pt is stable for tube feeds, recommend:  Vital 1.2 '@50ml' /hr- Initiate at 28m/hr and increase by 162mhr q 8 hours until goal rate is reached.   Free water flushes 3049m4 hours to maintain tube patency   Regimen provides 1440kcal/day, 90g/day protein and 1153m11my free water   Pt at high refeed risk; recommend monitor potassium, magnesium and phosphorus labs daily until stable  NUTRITION DIAGNOSIS:   Severe Malnutrition related to chronic illness as evidenced by severe fat depletion,severe muscle depletion. -new diagnosis   GOAL:   Provide needs based on ASPEN/SCCM guidelines  -previously met with tube feeds   MONITOR:   Vent status,Labs,Weight trends,TF tolerance,Skin,I & O's  ASSESSMENT:   89 y51. male with medical history significant for hypertension, diabetes mellitus with stage IV chronic kidney disease, coronary artery disease, COPD, HLD and ex-smoker who was sent to the emergency room from his primary care provider's office for evaluation of hypoxia.  Pt s/p dialysis catheter placement 6/6 Pt s/p fluoroscopy guided nasogastric tube placement 6/6   Pt s/p PEA arrest yesterday; pt now sedated and ventilated. Pt with new rib fractures. NGT in place. Tube feeds on hold today. Palliative care following; goals of care being discussed. Pt initiated on dextrose; pt is refeeding. Pressor requirements decreasing; will plan to resume tube feeds tomorrow if pt is stable. Nephrology holding dialysis for now.   Medications reviewed and include: aspirin, colace, heparin, miralax, NaCl w/ 5% dextrose '@100ml' /hr, ceftriaxone, levophed  Labs reviewed: Na 148(H), BUN 76(H), creat 3.28(H), K 4.8 wnl, P 1.9(L), Mg 2.5(H), alk phos 200(H), AST 1167(H), ALT 745(H) Wbc 12.3(H), Hgb 8.8(L), Hct 29.5(L)  Patient is currently intubated on ventilator support MV: 8.8  L/min Temp (24hrs), Avg:97.8 F (36.6 C), Min:97.5 F (36.4 C), Max:98.1 F (36.7 C)  Propofol: none   MAP- >65mm33mUOP- 1225ml 74mt Order:   Diet Order            Diet NPO time specified  Diet effective now                EDUCATION NEEDS:   No education needs have been identified at this time  Skin:  Skin Assessment: Reviewed RN Assessment  Last BM:  6/8- type 5  Height:   Ht Readings from Last 1 Encounters:  04/05/21 '5\' 6"'  (1.676 m)    Weight:   Wt Readings from Last 1 Encounters:  04/04/21 60.9 kg    Ideal Body Weight:  64.5 kg  BMI:  Body mass index is 21.67 kg/m.  Estimated Nutritional Needs:   Kcal:  1350kcal/day  Protein:  90-105g/day  Fluid:  1.5-1.8L/day  [employer] DistanceD, LDN Please refer to [facility] and/or RD on-call/weekend/after hours pager

## 2021-04-06 LAB — CBC WITH DIFFERENTIAL/PLATELET
Abs Immature Granulocytes: 0.18 10*3/uL — ABNORMAL HIGH (ref 0.00–0.07)
Basophils Absolute: 0 10*3/uL (ref 0.0–0.1)
Basophils Relative: 0 %
Eosinophils Absolute: 0.1 10*3/uL (ref 0.0–0.5)
Eosinophils Relative: 1 %
HCT: 26.9 % — ABNORMAL LOW (ref 39.0–52.0)
Hemoglobin: 8.6 g/dL — ABNORMAL LOW (ref 13.0–17.0)
Immature Granulocytes: 2 %
Lymphocytes Relative: 4 %
Lymphs Abs: 0.4 10*3/uL — ABNORMAL LOW (ref 0.7–4.0)
MCH: 30.6 pg (ref 26.0–34.0)
MCHC: 32 g/dL (ref 30.0–36.0)
MCV: 95.7 fL (ref 80.0–100.0)
Monocytes Absolute: 0.9 10*3/uL (ref 0.1–1.0)
Monocytes Relative: 9 %
Neutro Abs: 7.9 10*3/uL — ABNORMAL HIGH (ref 1.7–7.7)
Neutrophils Relative %: 84 %
Platelets: 117 10*3/uL — ABNORMAL LOW (ref 150–400)
RBC: 2.81 MIL/uL — ABNORMAL LOW (ref 4.22–5.81)
RDW: 15.6 % — ABNORMAL HIGH (ref 11.5–15.5)
WBC: 9.4 10*3/uL (ref 4.0–10.5)
nRBC: 1.2 % — ABNORMAL HIGH (ref 0.0–0.2)

## 2021-04-06 LAB — BASIC METABOLIC PANEL
Anion gap: 7 (ref 5–15)
BUN: 83 mg/dL — ABNORMAL HIGH (ref 8–23)
CO2: 27 mmol/L (ref 22–32)
Calcium: 7.8 mg/dL — ABNORMAL LOW (ref 8.9–10.3)
Chloride: 113 mmol/L — ABNORMAL HIGH (ref 98–111)
Creatinine, Ser: 3.55 mg/dL — ABNORMAL HIGH (ref 0.61–1.24)
GFR, Estimated: 15 mL/min — ABNORMAL LOW (ref >60)
Glucose, Bld: 149 mg/dL — ABNORMAL HIGH (ref 70–99)
Potassium: 3.8 mmol/L (ref 3.5–5.1)
Sodium: 147 mmol/L — ABNORMAL HIGH (ref 135–145)

## 2021-04-06 LAB — GLUCOSE, CAPILLARY
Glucose-Capillary: 148 mg/dL — ABNORMAL HIGH (ref 70–99)
Glucose-Capillary: 139 mg/dL — ABNORMAL HIGH (ref 70–99)
Glucose-Capillary: 135 mg/dL — ABNORMAL HIGH (ref 70–99)
Glucose-Capillary: 83 mg/dL (ref 70–99)
Glucose-Capillary: 123 mg/dL — ABNORMAL HIGH (ref 70–99)
Glucose-Capillary: 154 mg/dL — ABNORMAL HIGH (ref 70–99)

## 2021-04-06 LAB — TRIGLYCERIDES: Triglycerides: 56 mg/dL (ref <150)

## 2021-04-06 LAB — PHOSPHORUS: Phosphorus: <1 mg/dL — CL (ref 2.5–4.6)

## 2021-04-06 LAB — MAGNESIUM: Magnesium: 2.2 mg/dL (ref 1.7–2.4)

## 2021-04-06 MED ORDER — K PHOS MONO-SOD PHOS DI & MONO 155-852-130 MG PO TABS
250.0000 mg | ORAL_TABLET | Freq: Three times a day (TID) | ORAL | Status: DC
  Filled 2021-04-06: qty 1

## 2021-04-06 MED ORDER — K PHOS MONO-SOD PHOS DI & MONO 155-852-130 MG PO TABS
250.0000 mg | ORAL_TABLET | Freq: Three times a day (TID) | ORAL | Status: DC
  Administered 2021-04-06 – 2021-04-08 (×9): 250 mg
  Filled 2021-04-06 (×12): qty 1

## 2021-04-06 MED ORDER — PANTOPRAZOLE SODIUM 40 MG PO PACK
40.0000 mg | PACK | Freq: Every day | ORAL | Status: DC
  Administered 2021-04-06 – 2021-04-08 (×3): 40 mg
  Filled 2021-04-06 (×3): qty 20

## 2021-04-06 MED ORDER — FREE WATER
30.0000 mL | Status: DC
  Administered 2021-04-06 – 2021-04-09 (×17): 30 mL

## 2021-04-06 MED ORDER — VITAL AF 1.2 CAL PO LIQD
1000.0000 mL | ORAL | Status: DC
  Administered 2021-04-06 – 2021-04-08 (×2): 1000 mL

## 2021-04-06 NOTE — Progress Notes (Signed)
Chaplain Maggie made introductory visit to meet family member at patient's bedside. Offered hospitality through [employer] of presence. Chaplain made room for storytelling and empathetic listening with patient's great granddaughter. She spoke of the love she and her family knows from her great grandparent. Continued spiritual support available as needed per on call Chaplain.

## 2021-04-06 NOTE — Progress Notes (Signed)
NAME:  Samuel Sharum., MRN:  [id number], DOB:  1927-01-19, LOS: 4 ADMISSION DATE:  03/29/2021  87 year old male presenting to [facility] ED on 04/13/2021 from primary care provider's office for evaluation of hypoxia.  Per ED documentation the patient's daughter states he fell to his knees 3 days prior to his admission trying to get into a chair.  He has complained of having intermittent episodes of dizziness and lightheadedness.  His daughter also reported he has had a productive cough with clear phlegm, shortness of breath and has become increasingly weaker with poor oral intake.  Daughter took the patient to see his PCP where he was found to be hypoxic with an SPO2 of 88%.   I think patient is in the beginning of dying process   Patient is this and frail Patient with signs and symptoms of failure to thrive High risk for cardiac arrest and death-This was relayed to family   Hospital course: Significant Hospital Events: Including procedures, antibiotic start and stop dates in addition to other pertinent events       04/01/2021-nephrology consulted in the setting of AKI on CKD 3a & hyperkalemia due to suspected ATN secondary to volume depletion.  Patient treated with IV fluids giving shifting agents for hyperkalemia. 04/02/2021-concerns for ongoing lethargy, neurology consulted for additional work-up.  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  MRA head and neck revealed left carotid enterectomy and stable right carotid bifurcation without significant stenosis.  Chronic moderate to severe bilateral vertebral artery origin stenosis due to calcified clot but not significantly changed. 04/18/2021-decision made to proceed with hemodialysis, vascular placed HD cath.  While in dialysis before being hooked up to receive treatment patient's BP dropped to 69/36.  Plan for HD treatment aborted and patient transferred to ICU for peripheral  vasopressors.   PCCM consulted due to vasopressor need.   6/7 frail thin nonverbal, tachycardic 6/8 acute cardiac arrest ACLS/CPR 10 mins 6/8 intubated, patient was made DNR status 6/9 remains critically ill, multiorgan failure     Interim History / Subjective:  Remains critically ill Prognosis is very poor +ischemic cardiomyopathy In the dying process       Objective   Blood pressure (!) 124/52, pulse 67, temperature 98.4 F (36.9 C), temperature source Axillary, resp. rate 18, height '5\' 6"'$  (1.676 m), weight 68 kg, SpO2 100 %.    Vent Mode: PRVC FiO2 (%):  [30 %] 30 % Set Rate:  [18 bmp] 18 bmp Vt Set:  [500 mL] 500 mL PEEP:  [5 cmH20] 5 cmH20   Intake/Output Summary (Last 24 hours) at 04/06/2021 0725 Last data filed at 04/06/2021 0600 Gross per 24 hour  Intake 3110.32 ml  Output 266 ml  Net 2844.32 ml   Filed Weights   04/08/2021 1045 04/04/21 0500 04/06/21 0453  Weight: 59 kg 60.9 kg 68 kg      REVIEW OF SYSTEMS  PATIENT IS UNABLE TO PROVIDE COMPLETE REVIEW OF SYSTEMS DUE TO SEVERE CRITICAL ILLNESS AND TOXIC METABOLIC ENCEPHALOPATHY   PHYSICAL EXAMINATION:  GENERAL:critically ill appearing, +resp distress HEAD: Normocephalic, atraumatic.  EYES: Pupils equal, round, reactive to light.  No scleral icterus.  MOUTH: Moist mucosal membrane. NECK: Supple. PULMONARY: +rhonchi, +wheezing CARDIOVASCULAR: S1 and S2. Regular rate and rhythm. No murmurs, rubs, or gallops.  GASTROINTESTINAL: Soft, nontender, -distended. Positive bowel sounds.  MUSCULOSKELETAL: No swelling, clubbing, or edema.  NEUROLOGIC: obtunded SKIN:intact,warm,dry   Labs/imaging that I havepersonally reviewed  (  right click and "Reselect all SmartList Selections" daily)      ASSESSMENT AND PLAN SYNOPSIS   87 yo AAM with acute and severe toxic metabolic encephalopathy with signs and symptoms of aspiration with renal failure progressive CKD stage 3, patient with advanced age and  progressive decline, I think patient is in the beginning of dying process, now with acute cardiac arrest with ischemic cardiomopathy   Severe ACUTE Hypoxic and Hypercapnic Respiratory Failure -continue Mechanical Ventilator support -continue Bronchodilator Therapy -Wean Fio2 and PEEP as tolerated -VAP/VENT bundle implementation Patient will need TRACH AND PEG TO SURVIVE   CARDIAC FAILURE-acute ischemic cardiomyopathy Prognosis is poor   CARDIAC ICU monitoring   ACUTE KIDNEY INJURY/Renal Failure -continue Foley Catheter-assess need -Avoid nephrotoxic agents -Follow urine output, BMP -Ensure adequate renal perfusion, optimize oxygenation -Renal dose medications   NEUROLOGY Acute toxic metabolic encephalopathy, need for sedation Goal RASS -2 to -3   CARDIOGENIC SHOCK -use vasopressors to keep MAP>65 as needed    ENDO - ICU hypoglycemic\Hyperglycemia protocol -check FSBS per protocol   GI GI PROPHYLAXIS as indicated  NUTRITIONAL STATUS DIET-->TF's as tolerated Constipation protocol as indicated   ELECTROLYTES -follow labs as needed -replace as needed -pharmacy consultation and following    Best practice (right click and "Reselect all SmartList Selections" daily)  Diet:  NPO Pain/Anxiety/Delirium protocol (if indicated): YES VAP protocol (if indicated): YES DVT prophylaxis: Subcutaneous Heparin GI prophylaxis: N/A Glucose control:  SSI No Central venous access:  N/A Arterial line:  N/A Foley:  Yes, and it is still needed Mobility:  bed rest  PT consulted: N/A Code Status:  DNR Disposition: ICU    Labs   CBC: Recent Labs  Lab 04/21/2021 1649 04/01/21 0532 04/04/21 0344 04/05/21 0614 04/06/21 0434  WBC 4.2 7.3 9.1 12.3* 9.4  NEUTROABS  --   --   --  10.7* 7.9*  HGB 11.7* 13.2 9.3* 8.8* 8.6*  HCT 39.9 45.3 31.4* 29.5* 26.9*  MCV 102.6* 103.9* 103.3* 102.1* 95.7  PLT 199 237 122* 126* 117*    Basic Metabolic Panel: Recent Labs  Lab  04/19/2021 1649 04/01/21 0532 04/21/2021 0505 04/14/2021 2133 04/04/21 0344 04/05/21 0614 04/06/21 0434  NA  --    < > 143 142 144 148* 147*  K  --    < > 6.2* 5.4* 4.9 4.8 3.8  CL  --    < > 100 103 104 107 113*  CO2  --    < > 35* 34* 35* 28 27  GLUCOSE  --    < > 248* 179* 174* 198* 149*  BUN  --    < > 70* 72* 70* 76* 83*  CREATININE 1.52*   < > 3.17* 3.30* 3.07* 3.28* 3.55*  CALCIUM  --    < > 7.7* 7.8* 7.9* 8.2* 7.8*  MG 2.3  --   --  2.5* 2.3 2.5* 2.2  PHOS  --   --   --  3.5 3.2 1.9* <1.0*   < > = values in this interval not displayed.   GFR: Estimated Creatinine Clearance: 12 mL/min (A) (by C-G formula based on SCr of 3.55 mg/dL (H)). Recent Labs  Lab 04/16/2021 1135 04/27/2021 1506 04/11/2021 1649 04/01/21 0532 04/01/21 1126 04/04/21 0344 04/04/21 1201 04/05/21 0614 04/06/21 0434  PROCALCITON  --   --   --   --  <0.10  --   --   --   --   WBC  --   --    < >  7.3  --  9.1  --  12.3* 9.4  LATICACIDVEN 0.8 2.2*  --   --  1.6  --  1.1  --   --    < > = values in this interval not displayed.    Liver Function Tests: Recent Labs  Lab 04/13/2021 1051 04/22/2021 2133 04/04/21 0344 04/05/21 0614  AST 17  --   --  1,167*  ALT 11  --   --  745*  ALKPHOS 49  --   --  200*  BILITOT 0.7  --   --  0.8  PROT 7.3  --   --  5.4*  ALBUMIN 3.6 2.7* 2.5* 2.4*   No results for input(s): LIPASE, AMYLASE in the last 168 hours. Recent Labs  Lab 04/02/21 0919  AMMONIA 27    ABG    Component Value Date/Time   PHART 7.50 (H) 04/05/2021 0330   PCO2ART 32 04/05/2021 0330   PO2ART 161 (H) 04/05/2021 0330   HCO3 25.0 04/05/2021 0330   O2SAT 99.6 04/05/2021 0330     Coagulation Profile: No results for input(s): INR, PROTIME in the last 168 hours.  Cardiac Enzymes: No results for input(s): CKTOTAL, CKMB, CKMBINDEX, TROPONINI in the last 168 hours.  HbA1C: Hemoglobin A1C  Date/Time Value Ref Range Status  12/19/2020 09:25 AM 5.9 (A) 4.0 - 5.6 % Final   HbA1c, POC (controlled  diabetic range)  Date/Time Value Ref Range Status  09/18/2019 09:44 AM 6.0 0.0 - 7.0 % Final   Hgb A1c MFr Bld  Date/Time Value Ref Range Status  03/18/2020 09:51 AM 6.0 (H) <5.7 % of total Hgb Final    Comment:    For someone without known diabetes, a hemoglobin  A1c value between 5.7% and 6.4% is consistent with prediabetes and should be confirmed with a  follow-up test. . For someone with known diabetes, a value <7% indicates that their diabetes is well controlled. A1c targets should be individualized based on duration of diabetes, age, comorbid conditions, and other considerations. . This assay result is consistent with an increased risk of diabetes. . Currently, no consensus exists regarding use of hemoglobin A1c for diagnosis of diabetes for children. .   08/21/2018 09:30 AM 6.5 (H) <5.7 % of total Hgb Final    Comment:    For someone without known diabetes, a hemoglobin A1c value of 6.5% or greater indicates that they may have  diabetes and this should be confirmed with a follow-up  test. . For someone with known diabetes, a value <7% indicates  that their diabetes is well controlled and a value  greater than or equal to 7% indicates suboptimal  control. A1c targets should be individualized based on  duration of diabetes, age, comorbid conditions, and  other considerations. . Currently, no consensus exists regarding use of hemoglobin A1c for diagnosis of diabetes for children. .     CBG: Recent Labs  Lab 04/05/21 1145 04/05/21 1527 04/05/21 1931 04/05/21 2318 04/06/21 0327  GLUCAP 75 142* 129* 131* 148*    Allergies No Known Allergies     DVT/GI PRX  assessed I Assessed the need for Labs I Assessed the need for Foley I Assessed the need for Central Venous Line Family Discussion when available I Assessed the need for Mobilization I made an Assessment of medications to be adjusted accordingly Safety Risk assessment completed  CASE DISCUSSED  IN MULTIDISCIPLINARY ROUNDS WITH ICU TEAM     Critical Care Time devoted to patient care services  described in this note is 65 minutes.  Critical care was necessary to treat or prevent imminent or life-threatening deterioration. Overall, patient is critically ill, prognosis is guarded.  Patient with Multiorgan failure and at high risk for cardiac arrest and death.   Recommend Comfort care measures  Corrin Parker, M.D.  [facility]  Medical Director [place] Director [facility] Cardio-Pulmonary Department

## 2021-04-06 NOTE — Progress Notes (Signed)
[place], [place] 04/06/21  Subjective:   Hospital day # 4   Patient known to our practice from outpatient follow-up.  Information obtained from outpatient chart as well as review of inpatient chart and patient's daughter.  She reports that patient was in his usual state of health which include he is independent, able to mow his yard and make some meals for himself.  3 days prior to admission, he fell on his knees.  He has been complaining of intermittent dizziness lightheadedness.  He had a cough, with clear sputum.  He also reported loose stools for 2 to 3 days prior to admission.  Found to have acute kidney injury with increased creatinine of 1.70->2.8 and hyperkalemia.  Nephrology consult requested for evaluation.    No acute events overnight.  Oldest granddaughter at bedside today Currently sedated with fentanyl Ventilator assisted, FiO2 30% Cardiovascular: Tachycardic, irregular, requiring norepinephrine to support blood pressure-being weaned off GI: NG tube in place.  Getting IV fluids D5 normal saline 100 cc/h  Renal: 06/08 0701 - 06/09 0700 In: 3110.3 [I.V.:2430; NG/GT:180; IV Piggyback:200.3] Out: 266 [Urine:166; Emesis/NG output:100] Lab Results  Component Value Date   CREATININE 3.55 (H) 04/06/2021   CREATININE 3.28 (H) 04/05/2021   CREATININE 3.07 (H) 04/04/2021     Objective:  Vital signs in last 24 hours:  Temp:  [97 F (36.1 C)-99 F (37.2 C)] 98.4 F (36.9 C) (06/09 0400) Pulse Rate:  [59-87] 67 (06/09 0600) Resp:  [15-24] 18 (06/09 0600) BP: (87-137)/(46-74) 124/52 (06/09 0600) SpO2:  [100 %] 100 % (06/09 0600) FiO2 (%):  [30 %] 30 % (06/08 2056) Weight:  [68 kg] 68 kg (06/09 0453)  Weight change:  Filed Weights   04/22/2021 1045 04/04/21 0500 04/06/21 0453  Weight: 59 kg 60.9 kg 68 kg    Intake/Output:    Intake/Output Summary (Last 24 hours) at 04/06/2021 0829 Last data filed at 04/06/2021 0600 Gross per 24 hour  Intake  3110.32 ml  Output 229 ml  Net 2881.32 ml      Physical Exam: General:  Frail, elderly, laying in the bed, temporal wasting  HEENT  dry oral mucous membranes, NG tube in place  Pulm/lungs  ventilator assisted  CVS/Heart  no rub, irregular, tachycardic  Abdomen:   Somewhat firm in consistency, nontender  Extremities:  No peripheral edema  Neurologic:  Sedated  Skin:  No acute rashes   Foley catheter in place   Right femoral dialysis catheter in place    Basic Metabolic Panel:  Recent Labs  Lab 04/13/2021 1649 04/01/21 0532 04/06/2021 0505 04/05/2021 2133 04/04/21 0344 04/05/21 0614 04/06/21 0434  NA  --    < > 143 142 144 148* 147*  K  --    < > 6.2* 5.4* 4.9 4.8 3.8  CL  --    < > 100 103 104 107 113*  CO2  --    < > 35* 34* 35* 28 27  GLUCOSE  --    < > 248* 179* 174* 198* 149*  BUN  --    < > 70* 72* 70* 76* 83*  CREATININE 1.52*   < > 3.17* 3.30* 3.07* 3.28* 3.55*  CALCIUM  --    < > 7.7* 7.8* 7.9* 8.2* 7.8*  MG 2.3  --   --  2.5* 2.3 2.5* 2.2  PHOS  --   --   --  3.5 3.2 1.9* <1.0*   < > = values in this  interval not displayed.      CBC: Recent Labs  Lab 04/20/2021 1649 04/01/21 0532 04/04/21 0344 04/05/21 0614 04/06/21 0434  WBC 4.2 7.3 9.1 12.3* 9.4  NEUTROABS  --   --   --  10.7* 7.9*  HGB 11.7* 13.2 9.3* 8.8* 8.6*  HCT 39.9 45.3 31.4* 29.5* 26.9*  MCV 102.6* 103.9* 103.3* 102.1* 95.7  PLT 199 237 122* 126* 117*       Lab Results  Component Value Date   HEPBSAG NON REACTIVE 04/11/2021   HEPBSAB NON REACTIVE 04/27/2021      Microbiology:  Recent Results (from the past 240 hour(s))  Resp Panel by RT-PCR (Flu A&B, Covid) Nasopharyngeal Swab     Status: None   Collection Time: 04/24/2021 11:35 AM   Specimen: Nasopharyngeal Swab; Nasopharyngeal(NP) swabs in vial transport medium  Result Value Ref Range Status   SARS Coronavirus 2 by RT PCR NEGATIVE NEGATIVE Final    Comment: (NOTE) SARS-CoV-2 target nucleic acids are NOT DETECTED.  The  SARS-CoV-2 RNA is generally detectable in upper respiratory specimens during the acute phase of infection. The lowest concentration of SARS-CoV-2 viral copies this assay can detect is 138 copies/mL. A negative result does not preclude SARS-Cov-2 infection and should not be used as the sole basis for treatment or other patient management decisions. A negative result may occur with  improper specimen collection/handling, submission of specimen other than nasopharyngeal swab, presence of viral mutation(s) within the areas targeted by this assay, and inadequate number of viral copies(<138 copies/mL). A negative result must be combined with clinical observations, patient history, and epidemiological information. The expected result is Negative.  Fact Sheet for Patients:  EntrepreneurPulse.com.au  Fact Sheet for Healthcare Providers:  IncredibleEmployment.be  This test is no t yet approved or cleared by the [place] FDA and  has been authorized for detection and/or diagnosis of SARS-CoV-2 by FDA under an Emergency Use Authorization (EUA). This EUA will remain  in effect (meaning this test can be used) for the duration of the COVID-19 declaration under Section 564(b)(1) of the Act, 21 U.S.C.section 360bbb-3(b)(1), unless the authorization is terminated  or revoked sooner.       Influenza A by PCR NEGATIVE NEGATIVE Final   Influenza B by PCR NEGATIVE NEGATIVE Final    Comment: (NOTE) The Xpert Xpress SARS-CoV-2/FLU/RSV plus assay is intended as an aid in the diagnosis of influenza from Nasopharyngeal swab specimens and should not be used as a sole basis for treatment. Nasal washings and aspirates are unacceptable for Xpert Xpress SARS-CoV-2/FLU/RSV testing.  Fact Sheet for Patients: EntrepreneurPulse.com.au  Fact Sheet for Healthcare Providers: IncredibleEmployment.be  This test is not yet approved or  cleared by the [place] FDA and has been authorized for detection and/or diagnosis of SARS-CoV-2 by FDA under an Emergency Use Authorization (EUA). This EUA will remain in effect (meaning this test can be used) for the duration of the COVID-19 declaration under Section 564(b)(1) of the Act, 21 U.S.C. section 360bbb-3(b)(1), unless the authorization is terminated or revoked.  Performed at [facility], [address] [place], [place] [address]   MRSA PCR Screening     Status: None   Collection Time: 04/18/2021  9:04 PM   Specimen: Nasal Mucosa; Nasopharyngeal  Result Value Ref Range Status   MRSA by PCR NEGATIVE NEGATIVE Final    Comment:        The GeneXpert MRSA Assay (FDA approved for NASAL specimens only), is one component of a comprehensive MRSA  colonization surveillance program. It is not intended to diagnose MRSA infection nor to guide or monitor treatment for MRSA infections. Performed at [facility], [address], [place], [place] [address]   Urine Culture     Status: None   Collection Time: 04/06/2021  9:09 PM   Specimen: Urine, Random  Result Value Ref Range Status   Specimen Description   Final    URINE, RANDOM Performed at [facility], [address] [place], [place] [address]    Special Requests   Final    NONE Performed at [facility], [address], [place], [place] [address]    Culture   Final    NO GROWTH Performed at [facility], [place] [address], [place],  [address]    Report Status 04/05/2021 FINAL  Final    Coagulation Studies: No results for input(s): LABPROT, INR in the last 72 hours.  Urinalysis: No results for input(s): COLORURINE, LABSPEC, PHURINE, GLUCOSEU, HGBUR, BILIRUBINUR, KETONESUR, PROTEINUR, UROBILINOGEN, NITRITE, LEUKOCYTESUR in the last 72 hours.  Invalid input(s): APPERANCEUR     Imaging: DG Chest 1 View  Result Date: 04/05/2021 CLINICAL DATA:  Intubation and  nasogastric tube placement EXAM: CHEST  1 VIEW COMPARISON:  04/10/2021 FINDINGS: Endotracheal tube with tip just below the clavicular heads. The enteric tube reaches the stomach. Extensive artifact from EKG leads. Lower volume chest with indistinct infiltrates at the bases. There is elevation of the lateral right diaphragm scar-like appearance in this location by CT. Normal heart size. No visible pneumothorax. IMPRESSION: 1. Unremarkable endotracheal and orogastric tube positioning. 2. Worsening aeration with infiltrates at the bases. Electronically Signed   By: Monte Fantasia M.D.   On: 04/05/2021 04:13   DG Abd 1 View  Result Date: 04/05/2021 CLINICAL DATA:  Nasogastric tube placement EXAM: ABDOMEN - 1 VIEW COMPARISON:  04/04/2021 FINDINGS: Nasogastric tube tip is seen overlying the expected proximal body of the stomach. Right common femoral hemodialysis catheter tip is seen at the expected confluence of the a inferior vena cava and renal veins. Normal abdominal gas pattern. No gross free intraperitoneal gas. Vascular stents are seen within the common iliac arteries bilaterally. No acute bone abnormality. IMPRESSION: Nasogastric tube tip within the proximal body of the stomach. Electronically Signed   By: Fidela Salisbury MD   On: 04/05/2021 04:14   DG Abd 1 View  Result Date: 04/04/2021 CLINICAL DATA:  Nasogastric tube placed yesterday, abdominal pain EXAM: ABDOMEN - 1 VIEW COMPARISON:  None FINDINGS: Tip of nasogastric tube projects over proximal stomach. Curvilinear metallic foreign body projects LEFT paraspinal at I62, uncertain etiology. RIGHT femoral dual lumen central venous catheter tip projecting over SVC at L2. Retained contrast in colon. Scattered stool in colon to rectum. No bowel dilatation or bowel wall thickening. Osseous demineralization. IMPRESSION: No acute abnormalities. Electronically Signed   By: Lavonia Dana M.D.   On: 04/04/2021 14:52   ECHOCARDIOGRAM COMPLETE  Result Date:  04/04/2021    ECHOCARDIOGRAM REPORT   Patient Name:   Samuel Bryan. Date of Exam: 04/04/2021 Medical Rec #:  [id number]         Height:       66.0 in Accession #:    [id number]        Weight:       134.3 lb Date of Birth:  [birth year]         BSA:          1.688 m Patient Age:  87 years          BP:           147/61 mmHg Patient Gender: M                 HR:           94 bpm. Exam Location:  [facility] Procedure: 2D Echo, Color Doppler and Cardiac Doppler Indications:     Acute Respiratory distress R06.03  History:         Patient has no prior history of Echocardiogram examinations.                  Risk Factors:Diabetes and Hypertension. PVD.  Sonographer:     Sherrie Sport RDCS (AE) Referring Phys:  [id number] Flora Lipps Diagnosing Phys: Isaias Cowman MD  Sonographer Comments: No subcostal window, no parasternal window and Technically challenging study due to limited acoustic windows. Image acquisition challenging due to patient body habitus. IMPRESSIONS  1. Left ventricular ejection fraction, by estimation, is 60 to 65%. The left ventricle has normal function. The left ventricle has no regional wall motion abnormalities. Left ventricular diastolic parameters are indeterminate.  2. Right ventricular systolic function is normal. The right ventricular size is normal.  3. The mitral valve is normal in structure. Mild mitral valve regurgitation. No evidence of mitral stenosis.  4. The aortic valve is normal in structure. Aortic valve regurgitation is not visualized. No aortic stenosis is present.  5. The inferior vena cava is normal in size with greater than 50% respiratory variability, suggesting right atrial pressure of 3 mmHg. FINDINGS  Left Ventricle: Left ventricular ejection fraction, by estimation, is 60 to 65%. The left ventricle has normal function. The left ventricle has no regional wall motion abnormalities. The left ventricular internal cavity size was normal in size. There is  no left ventricular hypertrophy.  Left ventricular diastolic parameters are indeterminate. Right Ventricle: The right ventricular size is normal. No increase in right ventricular wall thickness. Right ventricular systolic function is normal. Left Atrium: Left atrial size was normal in size. Right Atrium: Right atrial size was normal in size. Pericardium: There is no evidence of pericardial effusion. Mitral Valve: The mitral valve is normal in structure. Mild mitral valve regurgitation. No evidence of mitral valve stenosis. Tricuspid Valve: The tricuspid valve is normal in structure. Tricuspid valve regurgitation is mild . No evidence of tricuspid stenosis. Aortic Valve: The aortic valve is normal in structure. Aortic valve regurgitation is not visualized. No aortic stenosis is present. Aortic valve mean gradient measures 2.0 mmHg. Aortic valve peak gradient measures 2.9 mmHg. Aortic valve area, by VTI measures 3.75 cm. Pulmonic Valve: The pulmonic valve was normal in structure. Pulmonic valve regurgitation is not visualized. No evidence of pulmonic stenosis. Aorta: The aortic root is normal in size and structure. Venous: The inferior vena cava is normal in size with greater than 50% respiratory variability, suggesting right atrial pressure of 3 mmHg. IAS/Shunts: No atrial level shunt detected by color flow Doppler.  LEFT VENTRICLE PLAX 2D LVIDd:         2.91 cm LVIDs:         1.61 cm LV PW:         1.31 cm LV IVS:        1.12 cm LVOT diam:     2.00 cm LV SV:         41 LV SV Index:   24 LVOT Area:     3.14 cm  RIGHT VENTRICLE RV S prime:     13.30 cm/s TAPSE (M-mode): 3.5 cm LEFT ATRIUM             Index       RIGHT ATRIUM           Index LA diam:        3.30 cm 1.95 cm/m  RA Area:     13.00 cm LA Vol (A2C):   71.2 ml 42.17 ml/m RA Volume:   27.70 ml  16.41 ml/m LA Vol (A4C):   53.9 ml 31.93 ml/m LA Biplane Vol: 65.1 ml 38.56 ml/m  AORTIC VALVE AV Area (Vmax):    2.64 cm AV Area (Vmean):   2.84 cm AV Area (VTI):     3.75 cm AV Vmax:            84.60 cm/s AV Vmean:          57.300 cm/s AV VTI:            0.109 m AV Peak Grad:      2.9 mmHg AV Mean Grad:      2.0 mmHg LVOT Vmax:         71.10 cm/s LVOT Vmean:        51.800 cm/s LVOT VTI:          0.130 m LVOT/AV VTI ratio: 1.19  AORTA Ao Root diam: 3.30 cm MITRAL VALVE MV Area (PHT): 3.65 cm    SHUNTS MV Decel Time: 208 msec    Systemic VTI:  0.13 m MV E velocity: 99.40 cm/s  Systemic Diam: 2.00 cm Isaias Cowman MD Electronically signed by Isaias Cowman MD Signature Date/Time: 04/04/2021/4:14:56 PM    Final       Medications:    sodium chloride 10 mL/hr at 04/06/21 0600   sodium chloride     sodium chloride     cefTRIAXone (ROCEPHIN)  IV Stopped (04/05/21 1629)   dextrose 5 % and 0.9% NaCl 100 mL/hr at 04/06/21 0600   fentaNYL infusion INTRAVENOUS 50 mcg/hr (04/06/21 0600)   norepinephrine (LEVOPHED) Adult infusion 2 mcg/min (04/06/21 0600)   phenylephrine (NEO-SYNEPHRINE) Adult infusion Stopped (04/05/21 0405)   propofol (DIPRIVAN) infusion Stopped (04/05/21 1130)    aspirin  81 mg Per Tube Daily   chlorhexidine gluconate (MEDLINE KIT)  15 mL Mouth Rinse BID   Chlorhexidine Gluconate Cloth  6 each Topical Q0600   docusate  100 mg Per Tube Daily   feeding supplement (NEPRO CARB STEADY)  1,000 mL Per Tube Q24H   heparin injection (subcutaneous)  5,000 Units Subcutaneous Q8H   latanoprost  1 drop Both Eyes QHS   mouth rinse  15 mL Mouth Rinse 10 times per day   midodrine  5 mg Per Tube TID WC   mometasone-formoterol  2 puff Inhalation BID   phosphorus  250 mg Oral TID   polyethylene glycol  17 g Per Tube Daily   sodium chloride flush  3 mL Intravenous Q12H   timolol  1 drop Both Eyes BID   sodium chloride, sodium chloride, sodium chloride, acetaminophen (TYLENOL) oral liquid 160 mg/5 mL, acetaminophen **OR** acetaminophen, alteplase, guaiFENesin, heparin, ipratropium-albuterol, lidocaine (PF), lidocaine-prilocaine, ondansetron **OR** ondansetron (ZOFRAN) IV,  pentafluoroprop-tetrafluoroeth, sodium chloride flush  Assessment/ Plan:  87 y.o. male with diabetes, hypertension, chronic kidney disease, coronary artery disease, ex-smoker, history of elevated PSA admitted on 04/19/2021 for Acute respiratory failure (HCC) [J96.00] Hypoxia [R09.02] Acute respiratory failure with hypoxia (HCC) [J96.01]   #Acute kidney injury  on Chronic kidney disease stage IIIa Patient has underlying chronic kidney disease.  Risk factors include hypertension, atherosclerosis, advanced age.  Baseline creatinine of 1.6, GFR 49 from February 2022 AKI likely secondary to volume depletion and ATN. Renal ultrasound shows small kidneys with echogenicity  Urinalysis 0-5 RBCs, specific gravity of 1.012, negative for nitrites and leukocytes  For urine output since cardiac arrest/ACLS.  Likely another ATN insult. Serum creatinine and BUN are now worsening No acute indication for dialysis at present Continue discussions with family regarding goals of care   #Acute respiratory failure Now ventilator assisted and requiring pressors  Patient with multiorgan failure Overall prognosis appears very poor   LOS: [place] 6/9/20228:29 [place], [place]

## 2021-04-07 DIAGNOSIS — I469 Cardiac arrest, cause unspecified: Secondary | ICD-10-CM

## 2021-04-07 LAB — CBC WITH DIFFERENTIAL/PLATELET
Abs Immature Granulocytes: 0.08 10*3/uL — ABNORMAL HIGH (ref 0.00–0.07)
Basophils Absolute: 0 10*3/uL (ref 0.0–0.1)
Basophils Relative: 0 %
Eosinophils Absolute: 0 10*3/uL (ref 0.0–0.5)
Eosinophils Relative: 0 %
HCT: 26.3 % — ABNORMAL LOW (ref 39.0–52.0)
Hemoglobin: 8.3 g/dL — ABNORMAL LOW (ref 13.0–17.0)
Immature Granulocytes: 1 %
Lymphocytes Relative: 3 %
Lymphs Abs: 0.4 10*3/uL — ABNORMAL LOW (ref 0.7–4.0)
MCH: 30.7 pg (ref 26.0–34.0)
MCHC: 31.6 g/dL (ref 30.0–36.0)
MCV: 97.4 fL (ref 80.0–100.0)
Monocytes Absolute: 1.1 10*3/uL — ABNORMAL HIGH (ref 0.1–1.0)
Monocytes Relative: 9 %
Neutro Abs: 10.6 10*3/uL — ABNORMAL HIGH (ref 1.7–7.7)
Neutrophils Relative %: 87 %
Platelets: 121 10*3/uL — ABNORMAL LOW (ref 150–400)
RBC: 2.7 MIL/uL — ABNORMAL LOW (ref 4.22–5.81)
RDW: 16.1 % — ABNORMAL HIGH (ref 11.5–15.5)
Smear Review: NORMAL
WBC: 12.3 10*3/uL — ABNORMAL HIGH (ref 4.0–10.5)
nRBC: 0.7 % — ABNORMAL HIGH (ref 0.0–0.2)

## 2021-04-07 LAB — BASIC METABOLIC PANEL
Anion gap: 9 (ref 5–15)
BUN: 84 mg/dL — ABNORMAL HIGH (ref 8–23)
CO2: 27 mmol/L (ref 22–32)
Calcium: 7.5 mg/dL — ABNORMAL LOW (ref 8.9–10.3)
Chloride: 112 mmol/L — ABNORMAL HIGH (ref 98–111)
Creatinine, Ser: 3.72 mg/dL — ABNORMAL HIGH (ref 0.61–1.24)
GFR, Estimated: 15 mL/min — ABNORMAL LOW (ref >60)
Glucose, Bld: 217 mg/dL — ABNORMAL HIGH (ref 70–99)
Potassium: 3.9 mmol/L (ref 3.5–5.1)
Sodium: 148 mmol/L — ABNORMAL HIGH (ref 135–145)

## 2021-04-07 LAB — GLUCOSE, CAPILLARY
Glucose-Capillary: 109 mg/dL — ABNORMAL HIGH (ref 70–99)
Glucose-Capillary: 132 mg/dL — ABNORMAL HIGH (ref 70–99)
Glucose-Capillary: 139 mg/dL — ABNORMAL HIGH (ref 70–99)
Glucose-Capillary: 142 mg/dL — ABNORMAL HIGH (ref 70–99)
Glucose-Capillary: 163 mg/dL — ABNORMAL HIGH (ref 70–99)
Glucose-Capillary: 95 mg/dL (ref 70–99)

## 2021-04-07 LAB — MAGNESIUM: Magnesium: 2 mg/dL (ref 1.7–2.4)

## 2021-04-07 LAB — PHOSPHORUS: Phosphorus: 2.2 mg/dL — ABNORMAL LOW (ref 2.5–4.6)

## 2021-04-07 NOTE — Progress Notes (Signed)
[place], [place] 04/07/21  Subjective:   Hospital day # 5   Patient known to our practice from outpatient follow-up.  Information obtained from outpatient chart as well as review of inpatient chart and patient's daughter.  She reports that patient was in his usual state of health which include he is independent, able to mow his yard and make some meals for himself.  3 days prior to admission, he fell on his knees.  He has been complaining of intermittent dizziness lightheadedness.  He had a cough, with clear sputum.  He also reported loose stools for 2 to 3 days prior to admission.  Found to have acute kidney injury with increased creatinine of 1.70->2.8 and hyperkalemia.  Nephrology consult requested for evaluation.    No acute events overnight.  granddaughter at bedside today who thinks patient is now suffering Currently sedated with fentanyl Ventilator assisted, FiO2 30% Cardiovascular: Tachycardic, irregular, requiring norepinephrine to support blood pressure-being weaned off GI: NG tube in place.  Tube feeds at 40 cc/h  Renal: 06/09 0701 - 06/10 0700 In: 3189.1 [I.V.:2488.3; NG/GT:700.8] Out: 575 [Urine:575] Lab Results  Component Value Date   CREATININE 3.72 (H) 04/07/2021   CREATININE 3.55 (H) 04/06/2021   CREATININE 3.28 (H) 04/05/2021     Objective:  Vital signs in last 24 hours:  Temp:  [98.6 F (37 C)-99.4 F (37.4 C)] 98.7 F (37.1 C) (06/10 0830) Pulse Rate:  [64-85] 78 (06/10 0830) Resp:  [16-19] 18 (06/10 0830) BP: (98-150)/(38-91) 128/50 (06/10 0830) SpO2:  [95 %-100 %] 100 % (06/10 0830) FiO2 (%):  [30 %] 30 % (06/09 1300) Weight:  [69.1 kg] 69.1 kg (06/10 0416)  Weight change: 1.1 kg Filed Weights   04/04/21 0500 04/06/21 0453 04/07/21 0416  Weight: 60.9 kg 68 kg 69.1 kg    Intake/Output:    Intake/Output Summary (Last 24 hours) at 04/07/2021 0857 Last data filed at 04/07/2021 0800 Gross per 24 hour  Intake 3381.72 ml   Output 575 ml  Net 2806.72 ml      Physical Exam: General:  Frail, elderly, laying in the bed, temporal wasting  HEENT  dry oral mucous membranes, NG tube in place  Pulm/lungs  ventilator assisted  CVS/Heart  no rub, irregular, tachycardic  Abdomen:   Somewhat firm in consistency, nontender  Extremities:  No peripheral edema  Neurologic:  Sedated   Foley catheter in place   Right femoral dialysis catheter in place    Basic Metabolic Panel:  Recent Labs  Lab 04/17/2021 2133 04/04/21 0344 04/05/21 0614 04/06/21 0434 04/07/21 0410  NA 142 144 148* 147* 148*  K 5.4* 4.9 4.8 3.8 3.9  CL 103 104 107 113* 112*  CO2 34* 35* _0 GLUCOSE 179* 174* 198* 149* 217*  BUN 72* 70* 76* 83* 84*  CREATININE 3.30* 3.07* 3.28* 3.55* 3.72*  CALCIUM 7.8* 7.9* 8.2* 7.8* 7.5*  MG 2.5* 2.3 2.5* 2.2 2.0  PHOS 3.5 3.2 1.9* <1.0* 2.2*      CBC: Recent Labs  Lab 04/01/21 0532 04/04/21 0344 04/05/21 0614 04/06/21 0434 04/07/21 0410  WBC 7.3 9.1 12.3* 9.4 12.3*  NEUTROABS  --   --  10.7* 7.9* 10.6*  HGB 13.2 9.3* 8.8* 8.6* 8.3*  HCT 45.3 31.4* 29.5* 26.9* 26.3*  MCV 103.9* 103.3* 102.1* 95.7 97.4  PLT 237 122* 126* 117* 121*       Lab Results  Component Value Date   HEPBSAG NON REACTIVE 04/26/2021  HEPBSAB NON REACTIVE 04/18/2021      Microbiology:  Recent Results (from the past 240 hour(s))  Resp Panel by RT-PCR (Flu A&B, Covid) Nasopharyngeal Swab     Status: None   Collection Time: 04/19/2021 11:35 AM   Specimen: Nasopharyngeal Swab; Nasopharyngeal(NP) swabs in vial transport medium  Result Value Ref Range Status   SARS Coronavirus 2 by RT PCR NEGATIVE NEGATIVE Final    Comment: (NOTE) SARS-CoV-2 target nucleic acids are NOT DETECTED.  The SARS-CoV-2 RNA is generally detectable in upper respiratory specimens during the acute phase of infection. The lowest concentration of SARS-CoV-2 viral copies this assay can detect is 138 copies/mL. A negative result does  not preclude SARS-Cov-2 infection and should not be used as the sole basis for treatment or other patient management decisions. A negative result may occur with  improper specimen collection/handling, submission of specimen other than nasopharyngeal swab, presence of viral mutation(s) within the areas targeted by this assay, and inadequate number of viral copies(<138 copies/mL). A negative result must be combined with clinical observations, patient history, and epidemiological information. The expected result is Negative.  Fact Sheet for Patients:  EntrepreneurPulse.com.au  Fact Sheet for Healthcare Providers:  IncredibleEmployment.be  This test is no t yet approved or cleared by the [place] FDA and  has been authorized for detection and/or diagnosis of SARS-CoV-2 by FDA under an Emergency Use Authorization (EUA). This EUA will remain  in effect (meaning this test can be used) for the duration of the COVID-19 declaration under Section 564(b)(1) of the Act, 21 U.S.C.section 360bbb-3(b)(1), unless the authorization is terminated  or revoked sooner.       Influenza A by PCR NEGATIVE NEGATIVE Final   Influenza B by PCR NEGATIVE NEGATIVE Final    Comment: (NOTE) The Xpert Xpress SARS-CoV-2/FLU/RSV plus assay is intended as an aid in the diagnosis of influenza from Nasopharyngeal swab specimens and should not be used as a sole basis for treatment. Nasal washings and aspirates are unacceptable for Xpert Xpress SARS-CoV-2/FLU/RSV testing.  Fact Sheet for Patients: EntrepreneurPulse.com.au  Fact Sheet for Healthcare Providers: IncredibleEmployment.be  This test is not yet approved or cleared by the [place] FDA and has been authorized for detection and/or diagnosis of SARS-CoV-2 by FDA under an Emergency Use Authorization (EUA). This EUA will remain in effect (meaning this test can be used) for the  duration of the COVID-19 declaration under Section 564(b)(1) of the Act, 21 U.S.C. section 360bbb-3(b)(1), unless the authorization is terminated or revoked.  Performed at [facility], [address] [place], [place] [address]   MRSA PCR Screening     Status: None   Collection Time: 04/04/2021  9:04 PM   Specimen: Nasal Mucosa; Nasopharyngeal  Result Value Ref Range Status   MRSA by PCR NEGATIVE NEGATIVE Final    Comment:        The GeneXpert MRSA Assay (FDA approved for NASAL specimens only), is one component of a comprehensive MRSA colonization surveillance program. It is not intended to diagnose MRSA infection nor to guide or monitor treatment for MRSA infections. Performed at [facility], [address], [place], [place] [address]   Urine Culture     Status: None   Collection Time: 03/29/2021  9:09 PM   Specimen: Urine, Random  Result Value Ref Range Status   Specimen Description   Final    URINE, RANDOM Performed at [facility], [address], [place], [place] [address]    Special Requests  Final    NONE Performed at [facility], [address], [place], [place] [address]    Culture   Final    NO GROWTH Performed at [facility], [place] [address] [place],  [address]    Report Status 04/05/2021 FINAL  Final    Coagulation Studies: No results for input(s): LABPROT, INR in the last 72 hours.  Urinalysis: No results for input(s): COLORURINE, LABSPEC, PHURINE, GLUCOSEU, HGBUR, BILIRUBINUR, KETONESUR, PROTEINUR, UROBILINOGEN, NITRITE, LEUKOCYTESUR in the last 72 hours.  Invalid input(s): APPERANCEUR     Imaging: No results found.    Medications:    sodium chloride Stopped (04/06/21 0801)   sodium chloride     sodium chloride     dextrose 5 % and 0.9% NaCl Stopped (04/07/21 0414)   feeding supplement (VITAL AF 1.2 CAL) 40 mL/hr at 04/07/21 0600   fentaNYL infusion INTRAVENOUS 125 mcg/hr (04/07/21  0800)   norepinephrine (LEVOPHED) Adult infusion 5 mcg/min (04/07/21 0800)   phenylephrine (NEO-SYNEPHRINE) Adult infusion Stopped (04/05/21 0405)   propofol (DIPRIVAN) infusion Stopped (04/05/21 1130)    aspirin  81 mg Per Tube Daily   chlorhexidine gluconate (MEDLINE KIT)  15 mL Mouth Rinse BID   Chlorhexidine Gluconate Cloth  6 each Topical Q0600   docusate  100 mg Per Tube Daily   free water  30 mL Per Tube Q4H   heparin injection (subcutaneous)  5,000 Units Subcutaneous Q8H   latanoprost  1 drop Both Eyes QHS   mouth rinse  15 mL Mouth Rinse 10 times per day   midodrine  5 mg Per Tube TID WC   pantoprazole sodium  40 mg Per Tube Daily   phosphorus  250 mg Per Tube TID   polyethylene glycol  17 g Per Tube Daily   sodium chloride flush  3 mL Intravenous Q12H   timolol  1 drop Both Eyes BID   sodium chloride, sodium chloride, sodium chloride, acetaminophen (TYLENOL) oral liquid 160 mg/5 mL, acetaminophen **OR** acetaminophen, alteplase, guaiFENesin, heparin, ipratropium-albuterol, lidocaine (PF), lidocaine-prilocaine, ondansetron **OR** ondansetron (ZOFRAN) IV, pentafluoroprop-tetrafluoroeth, sodium chloride flush  Assessment/ Plan:  87 y.o. male with diabetes, hypertension, chronic kidney disease, coronary artery disease, ex-smoker, history of elevated PSA admitted on 03/30/2021 for Acute respiratory failure (HCC) [J96.00] Hypoxia [R09.02] Acute respiratory failure with hypoxia (HCC) [J96.01]   #Acute kidney injury on Chronic kidney disease stage IIIa Patient has underlying chronic kidney disease.  Risk factors include hypertension, atherosclerosis, advanced age.  Baseline creatinine of 1.6, GFR 49 from February 2022 AKI likely secondary to volume depletion and ATN. Renal ultrasound shows small kidneys with echogenicity  Urinalysis 0-5 RBCs, specific gravity of 1.012, negative for nitrites and leukocytes  For urine output since cardiac arrest/ACLS.  Likely another ATN  insult. Serum creatinine and BUN are now worsening Urine output 575 cc last 24 hours, potassium 3.9, bicarb 27, phosphorus 2.2 Electrolytes and volume status are acceptable No acute indication for dialysis at present Continue discussions with family regarding goals of care   #Acute respiratory failure Now ventilator assisted and requiring pressors  #Hypernatremia Patient would benefit from free water supplementation Currently getting 30 cc every 4 hours Consider increasing to 100 cc every 4 hours  Patient with multiorgan failure Overall prognosis appears very poor   LOS: [place] 6/10/20228:57 AM  [place], [place]

## 2021-04-07 NOTE — Progress Notes (Signed)
NAME:  Samuel Cerney., MRN:  [id number], DOB:  29-Nov-1926, LOS: 84  87 year old male presenting to [facility] ED on 04/25/2021 from primary care provider's office for evaluation of hypoxia.  Per ED documentation the patient's daughter states he fell to his knees 3 days prior to his admission trying to get into a chair.  He has complained of having intermittent episodes of dizziness and lightheadedness.  His daughter also reported he has had a productive cough with clear phlegm, shortness of breath and has become increasingly weaker with poor oral intake.  Daughter took the patient to see his PCP where he was found to be hypoxic with an SPO2 of 88%.    Patient is this and frail Patient with signs and symptoms of failure to thrive High risk for cardiac arrest and death-This was relayed to family   Hospital course: Significant Hospital Events: Including procedures, antibiotic start and stop dates in addition to other pertinent events       04/01/2021-nephrology consulted in the setting of AKI on CKD 3a & hyperkalemia due to suspected ATN secondary to volume depletion.  Patient treated with IV fluids giving shifting agents for hyperkalemia. 04/02/2021-concerns for ongoing lethargy, neurology consulted for additional work-up.  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  MRA head and neck revealed left carotid enterectomy and stable right carotid bifurcation without significant stenosis.  Chronic moderate to severe bilateral vertebral artery origin stenosis due to calcified clot but not significantly changed. 03/29/2021-decision made to proceed with hemodialysis, vascular placed HD cath.  While in dialysis before being hooked up to receive treatment patient's BP dropped to 69/36.  Plan for HD treatment aborted and patient transferred to ICU for peripheral vasopressors.   PCCM consulted due to vasopressor need.   6/7 frail thin  nonverbal, tachycardic 6/8 acute cardiac arrest ACLS/CPR 10 mins 6/8 intubated, patient was made DNR status 6/10 family considering comfort measures     Interim History / Subjective:  multiorgan failure S/p cardiac arrest  +ischemic cardiomyopathy critically ill In the Dying process   Scheduled Meds:  aspirin  81 mg Per Tube Daily   chlorhexidine gluconate (MEDLINE KIT)  15 mL Mouth Rinse BID   Chlorhexidine Gluconate Cloth  6 each Topical Q0600   docusate  100 mg Per Tube Daily   free water  30 mL Per Tube Q4H   heparin injection (subcutaneous)  5,000 Units Subcutaneous Q8H   latanoprost  1 drop Both Eyes QHS   mouth rinse  15 mL Mouth Rinse 10 times per day   midodrine  5 mg Per Tube TID WC   pantoprazole sodium  40 mg Per Tube Daily   phosphorus  250 mg Per Tube TID   polyethylene glycol  17 g Per Tube Daily   sodium chloride flush  3 mL Intravenous Q12H   timolol  1 drop Both Eyes BID   Continuous Infusions:  sodium chloride Stopped (04/06/21 0801)   sodium chloride     sodium chloride     dextrose 5 % and 0.9% NaCl 100 mL/hr at 04/07/21 1900   feeding supplement (VITAL AF 1.2 CAL) 50 mL/hr at 04/07/21 1300   fentaNYL infusion INTRAVENOUS 175 mcg/hr (04/07/21 1935)   norepinephrine (LEVOPHED) Adult infusion Stopped (04/07/21 2142)   phenylephrine (NEO-SYNEPHRINE) Adult infusion Stopped (04/05/21 0405)   propofol (DIPRIVAN) infusion Stopped (04/05/21 1130)   PRN Meds:.sodium chloride, sodium chloride, sodium chloride, acetaminophen (TYLENOL) oral liquid  160 mg/5 mL, acetaminophen **OR** acetaminophen, alteplase, guaiFENesin, heparin, ipratropium-albuterol, lidocaine (PF), lidocaine-prilocaine, ondansetron **OR** ondansetron (ZOFRAN) IV, pentafluoroprop-tetrafluoroeth, sodium chloride flush    Objective   Blood pressure (!) 126/45, pulse 71, temperature 98 F (36.7 C), temperature source Oral, resp. rate 20, height '5\' 6"'  (1.676 m), weight 69.1 kg, SpO2 100 %.     Vent Mode: PRVC Set Rate:  [18 bmp] 18 bmp Vt Set:  [500 mL] 500 mL PEEP:  [5 cmH20] 5 cmH20 Plateau Pressure:  [20 PQD82-64 cmH20] 20 cmH20   Intake/Output Summary (Last 24 hours) at 04/07/2021 2230 Last data filed at 04/07/2021 2142 Gross per 24 hour  Intake 2449.2 ml  Output 570 ml  Net 1879.2 ml    Filed Weights   04/04/21 0500 04/06/21 0453 04/07/21 0416  Weight: 60.9 kg 68 kg 69.1 kg      REVIEW OF SYSTEMS Patient intubated, mechanically ventilated.  Unable to obtain   PHYSICAL EXAMINATION:  GENERAL: Thin, frail, chronically ill appearing, intubated, mechanically ventilated, obtunded on minimal sedation NECK: Supple.  Trachea midline PULMONARY: Coarse breath sounds throughout, no ventilator asynchrony CARDIOVASCULAR: Atrial fibrillation, rate controlled, no murmurs. GASTROINTESTINAL: Soft, nont-distended. Positive bowel sounds.  MUSCULOSKELETAL: No swelling, clubbing, or edema.  Senile sarcopenia noted NEUROLOGIC: obtunded on minimal analgesic infusion SKIN:intact,warm,dry   ASSESSMENT AND PLAN SYNOPSIS  87 yo AAM with acute and severe toxic metabolic encephalopathy with signs and symptoms of aspiration with renal failure progressive CKD stage 3, patient with advanced age and progressive decline, the patient appears to be in the dying process, now with acute cardiac arrest with ischemic cardiomopathy  ACUTE Hypoxic Respiratory Failure Cardiac arrest -continue Mechanical Ventilator support -continue Bronchodilator Therapy -Wean Fio2 and PEEP as tolerated -VAP/VENT bundle implementation Will NOT be able to wean from vent  Cardiac arrest History of ischemic cardiomyopathy -2D echo shows normal LVEF, diastolic dysfunction -follow up cardiac enzymes as indicated -Query metabolic abnormality/arrhythmia as cause for arrest  Persistent shock post arrest Persistent pressor requirement (low-dose) On midodrine  Acute on chronic renal failure  Hx: CKD stage  IIIa -AKI component secondary to volume depletion/ATN -continue Foley Catheter-assess need -Avoid nephrotoxic agents -Follow urine output, BMP -Ensure adequate renal perfusion, optimize oxygenation -Renal dose medications -Attempted dialysis but not tolerated by the patient  Acute toxic metabolic encephalopathy Likely due to renal derangements Fall prior to admission Unrevealing brain imaging (CT/MRI)  ENDO - ICU hypoglycemic\Hyperglycemia protocol -check FSBS per protocol     Best practice (right click and "Reselect all SmartList Selections" daily)  Diet: Tube feeds Pain/Anxiety/Delirium protocol (if indicated): YES VAP protocol (if indicated): YES DVT prophylaxis: Subcutaneous Heparin GI prophylaxis: N/A Glucose control:  SSI No Central venous access:  N/A Arterial line:  N/A Foley:  Yes, and it is still needed Mobility:  bed rest  PT consulted: N/A Code Status:  DNR Disposition: ICU   Labs   CBC: Recent Labs  Lab 04/01/21 0532 04/04/21 0344 04/05/21 0614 04/06/21 0434 04/07/21 0410  WBC 7.3 9.1 12.3* 9.4 12.3*  NEUTROABS  --   --  10.7* 7.9* 10.6*  HGB 13.2 9.3* 8.8* 8.6* 8.3*  HCT 45.3 31.4* 29.5* 26.9* 26.3*  MCV 103.9* 103.3* 102.1* 95.7 97.4  PLT 237 122* 126* 117* 121*     Basic Metabolic Panel: Recent Labs  Lab 04/11/2021 2133 04/04/21 0344 04/05/21 0614 04/06/21 0434 04/07/21 0410  NA 142 144 148* 147* 148*  K 5.4* 4.9 4.8 3.8 3.9  CL 103 104 107 113* 112*  CO2  34* 35* '28 27 27  ' GLUCOSE 179* 174* 198* 149* 217*  BUN 72* 70* 76* 83* 84*  CREATININE 3.30* 3.07* 3.28* 3.55* 3.72*  CALCIUM 7.8* 7.9* 8.2* 7.8* 7.5*  MG 2.5* 2.3 2.5* 2.2 2.0  PHOS 3.5 3.2 1.9* <1.0* 2.2*    GFR: Estimated Creatinine Clearance: 11.4 mL/min (A) (by C-G formula based on SCr of 3.72 mg/dL (H)). Recent Labs  Lab 04/01/21 1126 04/04/21 0344 04/04/21 1201 04/05/21 0614 04/06/21 0434 04/07/21 0410  PROCALCITON <0.10  --   --   --   --   --   WBC  --  9.1   --  12.3* 9.4 12.3*  LATICACIDVEN 1.6  --  1.1  --   --   --      Liver Function Tests: Recent Labs  Lab 03/30/2021 2133 04/04/21 0344 04/05/21 0614  AST  --   --  1,167*  ALT  --   --  745*  ALKPHOS  --   --  200*  BILITOT  --   --  0.8  PROT  --   --  5.4*  ALBUMIN 2.7* 2.5* 2.4*    No results for input(s): LIPASE, AMYLASE in the last 168 hours. Recent Labs  Lab 04/02/21 0919  AMMONIA 27     ABG    Component Value Date/Time   PHART 7.50 (H) 04/05/2021 0330   PCO2ART 32 04/05/2021 0330   PO2ART 161 (H) 04/05/2021 0330   HCO3 25.0 04/05/2021 0330   O2SAT 99.6 04/05/2021 0330     Coagulation Profile: No results for input(s): INR, PROTIME in the last 168 hours.  Cardiac Enzymes: No results for input(s): CKTOTAL, CKMB, CKMBINDEX, TROPONINI in the last 168 hours.  HbA1C: Hemoglobin A1C  Date/Time Value Ref Range Status  12/19/2020 09:25 AM 5.9 (A) 4.0 - 5.6 % Final   HbA1c, POC (controlled diabetic range)  Date/Time Value Ref Range Status  09/18/2019 09:44 AM 6.0 0.0 - 7.0 % Final   Hgb A1c MFr Bld  Date/Time Value Ref Range Status  03/18/2020 09:51 AM 6.0 (H) <5.7 % of total Hgb Final    Comment:    For someone without known diabetes, a hemoglobin  A1c value between 5.7% and 6.4% is consistent with prediabetes and should be confirmed with a  follow-up test. . For someone with known diabetes, a value <7% indicates that their diabetes is well controlled. A1c targets should be individualized based on duration of diabetes, age, comorbid conditions, and other considerations. . This assay result is consistent with an increased risk of diabetes. . Currently, no consensus exists regarding use of hemoglobin A1c for diagnosis of diabetes for children. .   08/21/2018 09:30 AM 6.5 (H) <5.7 % of total Hgb Final    Comment:    For someone without known diabetes, a hemoglobin A1c value of 6.5% or greater indicates that they may have  diabetes and this should  be confirmed with a follow-up  test. . For someone with known diabetes, a value <7% indicates  that their diabetes is well controlled and a value  greater than or equal to 7% indicates suboptimal  control. A1c targets should be individualized based on  duration of diabetes, age, comorbid conditions, and  other considerations. . Currently, no consensus exists regarding use of hemoglobin A1c for diagnosis of diabetes for children. .     CBG: Recent Labs  Lab 04/07/21 0312 04/07/21 0819 04/07/21 1117 04/07/21 1611 04/07/21 1941  GLUCAP 163* 139* 142*  109* 132*     Allergies No Known Allergies   CASE DISCUSSED IN MULTIDISCIPLINARY ROUNDS WITH ICU TEAM  The patient is critically ill with multiple organ systems failure and requires high complexity decision making for assessment and support, frequent evaluation and titration of therapies, application of advanced monitoring technologies and extensive interpretation of multiple databases. Critical Care Time devoted to patient care services described in this note is 40 minutes.  Renold Don, MD [employer] [facility]   *This note was dictated using voice recognition software/Dragon.  Despite best efforts to proofread, errors can occur which can change the meaning.  Any change was purely unintentional.

## 2021-04-08 LAB — CBC WITH DIFFERENTIAL/PLATELET
Abs Immature Granulocytes: 0.25 10*3/uL — ABNORMAL HIGH (ref 0.00–0.07)
Basophils Absolute: 0 10*3/uL (ref 0.0–0.1)
Basophils Relative: 0 %
Eosinophils Absolute: 0.1 10*3/uL (ref 0.0–0.5)
Eosinophils Relative: 1 %
HCT: 22.6 % — ABNORMAL LOW (ref 39.0–52.0)
Hemoglobin: 6.9 g/dL — ABNORMAL LOW (ref 13.0–17.0)
Immature Granulocytes: 2 %
Lymphocytes Relative: 5 %
Lymphs Abs: 0.5 10*3/uL — ABNORMAL LOW (ref 0.7–4.0)
MCH: 29.7 pg (ref 26.0–34.0)
MCHC: 30.5 g/dL (ref 30.0–36.0)
MCV: 97.4 fL (ref 80.0–100.0)
Monocytes Absolute: 1 10*3/uL (ref 0.1–1.0)
Monocytes Relative: 9 %
Neutro Abs: 8.7 10*3/uL — ABNORMAL HIGH (ref 1.7–7.7)
Neutrophils Relative %: 83 %
Platelets: 120 10*3/uL — ABNORMAL LOW (ref 150–400)
RBC: 2.32 MIL/uL — ABNORMAL LOW (ref 4.22–5.81)
RDW: 16.1 % — ABNORMAL HIGH (ref 11.5–15.5)
Smear Review: NORMAL
WBC: 10.5 10*3/uL (ref 4.0–10.5)
nRBC: 0.9 % — ABNORMAL HIGH (ref 0.0–0.2)

## 2021-04-08 LAB — PHOSPHORUS: Phosphorus: 1.9 mg/dL — ABNORMAL LOW (ref 2.5–4.6)

## 2021-04-08 LAB — BASIC METABOLIC PANEL
Anion gap: 7 (ref 5–15)
BUN: 82 mg/dL — ABNORMAL HIGH (ref 8–23)
CO2: 27 mmol/L (ref 22–32)
Calcium: 7.6 mg/dL — ABNORMAL LOW (ref 8.9–10.3)
Chloride: 114 mmol/L — ABNORMAL HIGH (ref 98–111)
Creatinine, Ser: 3.69 mg/dL — ABNORMAL HIGH (ref 0.61–1.24)
GFR, Estimated: 15 mL/min — ABNORMAL LOW (ref >60)
Glucose, Bld: 159 mg/dL — ABNORMAL HIGH (ref 70–99)
Potassium: 3.7 mmol/L (ref 3.5–5.1)
Sodium: 148 mmol/L — ABNORMAL HIGH (ref 135–145)

## 2021-04-08 LAB — GLUCOSE, CAPILLARY
Glucose-Capillary: 117 mg/dL — ABNORMAL HIGH (ref 70–99)
Glucose-Capillary: 133 mg/dL — ABNORMAL HIGH (ref 70–99)
Glucose-Capillary: 136 mg/dL — ABNORMAL HIGH (ref 70–99)
Glucose-Capillary: 139 mg/dL — ABNORMAL HIGH (ref 70–99)
Glucose-Capillary: 140 mg/dL — ABNORMAL HIGH (ref 70–99)
Glucose-Capillary: 143 mg/dL — ABNORMAL HIGH (ref 70–99)

## 2021-04-08 LAB — HEMOGLOBIN AND HEMATOCRIT, BLOOD
HCT: 23.8 % — ABNORMAL LOW (ref 39.0–52.0)
Hemoglobin: 7.4 g/dL — ABNORMAL LOW (ref 13.0–17.0)

## 2021-04-08 LAB — MAGNESIUM: Magnesium: 1.9 mg/dL (ref 1.7–2.4)

## 2021-04-08 MED ORDER — DEXTROSE 5 % IV SOLN: INTRAVENOUS | Status: DC

## 2021-04-08 NOTE — Progress Notes (Signed)
NAME:  Samuel Bryan., MRN:  [id number], DOB:  [birth year], LOS: 45  87 year old male presenting to [facility] ED on 04/24/2021 from primary care provider's office for evaluation of hypoxia.  Per ED documentation the patient's daughter states he fell to his knees 3 days prior to his admission trying to get into a chair.  He has complained of having intermittent episodes of dizziness and lightheadedness.  His daughter also reported he has had a productive cough with clear phlegm, shortness of breath and has become increasingly weaker with poor oral intake.  Daughter took the patient to see his PCP where he was found to be hypoxic with an SPO2 of 88%.    Patient with advanced age and frailty Patient with signs and symptoms of failure to thrive High risk for cardiac arrest and death-This was relayed to family   Hospital course: Significant Hospital Events: Including procedures, antibiotic start and stop dates in addition to other pertinent events       04/01/2021-nephrology consulted in the setting of AKI on CKD 3a & hyperkalemia due to suspected ATN secondary to volume depletion.  Patient treated with IV fluids giving shifting agents for hyperkalemia. 04/02/2021-concerns for ongoing lethargy, neurology consulted for additional work-up.  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  MRA head and neck revealed left carotid enterectomy and stable right carotid bifurcation without significant stenosis.  Chronic moderate to severe bilateral vertebral artery origin stenosis due to calcified clot but not significantly changed. 04/23/2021-decision made to proceed with hemodialysis, vascular placed HD cath.  While in dialysis before being hooked up to receive treatment patient's BP dropped to 69/36.  Plan for HD treatment aborted and patient transferred to ICU for peripheral vasopressors.   PCCM consulted due to vasopressor need.   6/7 frail  thin nonverbal, tachycardic 6/8 acute cardiac arrest ACLS/CPR 10 mins 6/8 intubated, patient was made DNR status 6/10 family considering comfort measures 6/11 remains on vent, appears terminal     Interim History / Subjective:  multiorgan failure S/p cardiac arrest  Query due to arrhythmia/metabolic derangements critically ill Appears terminal   Scheduled Meds:  chlorhexidine gluconate (MEDLINE KIT)  15 mL Mouth Rinse BID   Chlorhexidine Gluconate Cloth  6 each Topical Q0600   docusate  100 mg Per Tube Daily   free water  30 mL Per Tube Q4H   latanoprost  1 drop Both Eyes QHS   mouth rinse  15 mL Mouth Rinse 10 times per day   midodrine  5 mg Per Tube TID WC   pantoprazole sodium  40 mg Per Tube Daily   phosphorus  250 mg Per Tube TID   polyethylene glycol  17 g Per Tube Daily   sodium chloride flush  3 mL Intravenous Q12H   timolol  1 drop Both Eyes BID   Continuous Infusions:  sodium chloride Stopped (04/06/21 0801)   sodium chloride     sodium chloride     dextrose 75 mL/hr at 04/08/21 2000   feeding supplement (VITAL AF 1.2 CAL) 1,000 mL (04/08/21 1419)   fentaNYL infusion INTRAVENOUS 125 mcg/hr (04/08/21 2000)   norepinephrine (LEVOPHED) Adult infusion 3 mcg/min (04/08/21 2109)   PRN Meds:.sodium chloride, sodium chloride, sodium chloride, acetaminophen (TYLENOL) oral liquid 160 mg/5 mL, acetaminophen **OR** acetaminophen, alteplase, guaiFENesin, heparin, ipratropium-albuterol, lidocaine (PF), lidocaine-prilocaine, ondansetron **OR** ondansetron (ZOFRAN) IV, pentafluoroprop-tetrafluoroeth, sodium chloride flush    Objective   Blood pressure (!) 109/41, pulse  74, temperature 98.4 F (36.9 C), temperature source Axillary, resp. rate 17, height 5' 6" (1.676 m), weight 69.3 kg, SpO2 100 %.    Vent Mode: PRVC Set Rate:  [18 bmp] 18 bmp Vt Set:  [500 mL] 500 mL PEEP:  [5 cmH20] 5 cmH20 Plateau Pressure:  [0 cmH20-22 cmH20] 21 cmH20   Intake/Output Summary (Last 24  hours) at 04/08/2021 2139 Last data filed at 04/08/2021 2000 Gross per 24 hour  Intake 3466.02 ml  Output 725 ml  Net 2741.02 ml    Filed Weights   04/06/21 0453 04/07/21 0416 04/08/21 0500  Weight: 68 kg 69.1 kg 69.3 kg      REVIEW OF SYSTEMS Patient intubated, mechanically ventilated.  Unable to obtain.   PHYSICAL EXAMINATION:  GENERAL: Thin, frail, chronically ill appearing, intubated, mechanically ventilated, opens eyes on minimal sedation, does not follow commands NECK: Supple.  Trachea midline PULMONARY: Coarse breath sounds throughout, no ventilator asynchrony CARDIOVASCULAR: Atrial fibrillation, rate controlled, no murmurs. GASTROINTESTINAL: Soft, nont-distended. Positive bowel sounds. MUSCULOSKELETAL: No swelling, clubbing, or edema.  Senile sarcopenia noted NEUROLOGIC: obtunded on minimal analgesic infusion SKIN:intact,warm,dry    ASSESSMENT AND PLAN SYNOPSIS  87 yo AAM with acute and severe toxic metabolic encephalopathy with signs and symptoms of aspiration with renal failure progressive CKD stage 3, patient with advanced age and progressive decline, now with acute cardiac arrest with ischemic cardiomopathy  ACUTE Hypoxic Respiratory Failure Cardiac arrest -continue Mechanical Ventilator support -continue Bronchodilator Therapy -Wean Fio2 and PEEP as tolerated -VAP/VENT bundle implementation -Will NOT be able to wean from vent, he appears terminal -Discussed with grand daughter at bedside   Cardiac arrest History of ischemic cardiomyopathy -2D echo shows normal LVEF, diastolic dysfunction -follow up cardiac enzymes as indicated -Query metabolic abnormality/arrhythmia as cause for arrest   Persistent shock post arrest Persistent pressor requirement (low-dose) On midodrine Poor prognosis   Acute on chronic renal failure Hx: CKD stage IIIa -AKI component secondary to volume depletion/ATN -continue Foley Catheter-assess need -Avoid nephrotoxic  agents -Follow urine output, BMP -Ensure adequate renal perfusion, optimize oxygenation -Renal dose medications -Attempted dialysis but not tolerated by the patient -Poor prognosis  Acute toxic metabolic encephalopathy Likely due to renal derangements Fall prior to admission Unrevealing brain imaging (CT/MRI)   ENDO - ICU hypoglycemic\Hyperglycemia protocol -check FSBS per protocol   Best practice (right click and "Reselect all SmartList Selections" daily)  Diet: Tube feeds Pain/Anxiety/Delirium protocol (if indicated): YES VAP protocol (if indicated): YES DVT prophylaxis: Subcutaneous Heparin GI prophylaxis: N/A Glucose control:  SSI No Central venous access:  N/A Arterial line:  N/A Foley:  Yes, and it is still needed Mobility:  bed rest  PT consulted: N/A Code Status:  DNR, ongoing conversations with family of possible transition to comfort care Disposition: ICU   Labs   CBC: Recent Labs  Lab 04/04/21 0344 04/05/21 0614 04/06/21 0434 04/07/21 0410 04/08/21 0523  WBC 9.1 12.3* 9.4 12.3* 10.5  NEUTROABS  --  10.7* 7.9* 10.6* 8.7*  HGB 9.3* 8.8* 8.6* 8.3* 6.9*  HCT 31.4* 29.5* 26.9* 26.3* 22.6*  MCV 103.3* 102.1* 95.7 97.4 97.4  PLT 122* 126* 117* 121* 120*     Basic Metabolic Panel: Recent Labs  Lab 04/04/21 0344 04/05/21 0614 04/06/21 0434 04/07/21 0410 04/08/21 0523  NA 144 148* 147* 148* 148*  K 4.9 4.8 3.8 3.9 3.7  CL 104 107 113* 112* 114*  CO2 35* _0 GLUCOSE 174* 198* 149* 217* 159*  BUN 70*  76* 83* 84* 82*  CREATININE 3.07* 3.28* 3.55* 3.72* 3.69*  CALCIUM 7.9* 8.2* 7.8* 7.5* 7.6*  MG 2.3 2.5* 2.2 2.0 1.9  PHOS 3.2 1.9* <1.0* 2.2* 1.9*    GFR: Estimated Creatinine Clearance: 11.5 mL/min (A) (by C-G formula based on SCr of 3.69 mg/dL (H)). Recent Labs  Lab 04/04/21 1201 04/05/21 0614 04/06/21 0434 04/07/21 0410 04/08/21 0523  WBC  --  12.3* 9.4 12.3* 10.5  LATICACIDVEN 1.1  --   --   --   --      Liver Function  Tests: Recent Labs  Lab 04/10/2021 2133 04/04/21 0344 04/05/21 0614  AST  --   --  1,167*  ALT  --   --  745*  ALKPHOS  --   --  200*  BILITOT  --   --  0.8  PROT  --   --  5.4*  ALBUMIN 2.7* 2.5* 2.4*    No results for input(s): LIPASE, AMYLASE in the last 168 hours. Recent Labs  Lab 04/02/21 0919  AMMONIA 27     ABG    Component Value Date/Time   PHART 7.50 (H) 04/05/2021 0330   PCO2ART 32 04/05/2021 0330   PO2ART 161 (H) 04/05/2021 0330   HCO3 25.0 04/05/2021 0330   O2SAT 99.6 04/05/2021 0330     Coagulation Profile: No results for input(s): INR, PROTIME in the last 168 hours.  Cardiac Enzymes: No results for input(s): CKTOTAL, CKMB, CKMBINDEX, TROPONINI in the last 168 hours.  HbA1C: Hemoglobin A1C  Date/Time Value Ref Range Status  12/19/2020 09:25 AM 5.9 (A) 4.0 - 5.6 % Final   HbA1c, POC (controlled diabetic range)  Date/Time Value Ref Range Status  09/18/2019 09:44 AM 6.0 0.0 - 7.0 % Final   Hgb A1c MFr Bld  Date/Time Value Ref Range Status  03/18/2020 09:51 AM 6.0 (H) <5.7 % of total Hgb Final    Comment:    For someone without known diabetes, a hemoglobin  A1c value between 5.7% and 6.4% is consistent with prediabetes and should be confirmed with a  follow-up test. . For someone with known diabetes, a value <7% indicates that their diabetes is well controlled. A1c targets should be individualized based on duration of diabetes, age, comorbid conditions, and other considerations. . This assay result is consistent with an increased risk of diabetes. . Currently, no consensus exists regarding use of hemoglobin A1c for diagnosis of diabetes for children. .   08/21/2018 09:30 AM 6.5 (H) <5.7 % of total Hgb Final    Comment:    For someone without known diabetes, a hemoglobin A1c value of 6.5% or greater indicates that they may have  diabetes and this should be confirmed with a follow-up  test. . For someone with known diabetes, a value <7%  indicates  that their diabetes is well controlled and a value  greater than or equal to 7% indicates suboptimal  control. A1c targets should be individualized based on  duration of diabetes, age, comorbid conditions, and  other considerations. . Currently, no consensus exists regarding use of hemoglobin A1c for diagnosis of diabetes for children. .     CBG: Recent Labs  Lab 04/08/21 0343 04/08/21 0727 04/08/21 1142 04/08/21 1544 04/08/21 1951  GLUCAP 117* 133* 143* 140* 139*     Allergies No Known Allergies   The patient is critically ill with multiple organ systems failure and requires high complexity decision making for assessment and support, frequent evaluation and titration of therapies, application   of advanced monitoring technologies and extensive interpretation of multiple databases. Critical Care Time devoted to patient care services described in this note is 35 minutes.  C. Laura Gonzalez, MD [facility]   *This note was dictated using voice recognition software/Dragon.  Despite best efforts to proofread, errors can occur which can change the meaning.  Any change was purely unintentional.       

## 2021-04-08 NOTE — Progress Notes (Signed)
[place], [place] 04/08/21  Subjective:   Hospital day # 6   Patient known to our practice from outpatient follow-up.  Information obtained from outpatient chart as well as review of inpatient chart and patient's daughter.  She reports that patient was in his usual state of health which include he is independent, able to mow his yard and make some meals for himself.  3 days prior to admission, he fell on his knees.  He has been complaining of intermittent dizziness lightheadedness.  He had a cough, with clear sputum.  He also reported loose stools for 2 to 3 days prior to admission.  Found to have acute kidney injury with increased creatinine of 1.70->2.8 and hyperkalemia.  Nephrology consult requested for evaluation.    Update:  Patient remains critically ill and intubated. Creatinine down slightly to 3.69. BUN also still high at 82. Family member at bedside.  Renal: 06/10 0701 - 06/11 0700 In: 3559.2 [I.V.:1992.6; NG/GT:1566.7] Out: 795 [Urine:795] Lab Results  Component Value Date   CREATININE 3.69 (H) 04/08/2021   CREATININE 3.72 (H) 04/07/2021   CREATININE 3.55 (H) 04/06/2021     Objective:  Vital signs in last 24 hours:  Temp:  [98 F (36.7 C)-99.4 F (37.4 C)] 98.4 F (36.9 C) (06/11 0800) Pulse Rate:  [65-82] 74 (06/11 0900) Resp:  [17-20] 18 (06/11 0900) BP: (92-144)/(39-60) 117/46 (06/11 0900) SpO2:  [99 %-100 %] 100 % (06/11 0900) Weight:  [69.3 kg] 69.3 kg (06/11 0500)  Weight change: 0.2 kg Filed Weights   04/06/21 0453 04/07/21 0416 04/08/21 0500  Weight: 68 kg 69.1 kg 69.3 kg    Intake/Output:    Intake/Output Summary (Last 24 hours) at 04/08/2021 1001 Last data filed at 04/08/2021 0700 Gross per 24 hour  Intake 3162.05 ml  Output 775 ml  Net 2387.05 ml      Physical Exam: General:  Frail, elderly, laying in the bed, temporal wasting  HEENT  Endotracheal tube in place, NG in place  Pulm/lungs  ventilator  assisted, scattered rhonchi  CVS/Heart  no rub, irregular  Abdomen:   Distention noted, bowel sounds present  Extremities:  No peripheral edema  Neurologic:  Sedated  GU:  Foley catheter in place  Access:  Right femoral dialysis catheter in place    Basic Metabolic Panel:  Recent Labs  Lab 04/04/21 0344 04/05/21 0614 04/06/21 0434 04/07/21 0410 04/08/21 0523  NA 144 148* 147* 148* 148*  K 4.9 4.8 3.8 3.9 3.7  CL 104 107 113* 112* 114*  CO2 35* '28 27 27 27  ' GLUCOSE 174* 198* 149* 217* 159*  BUN 70* 76* 83* 84* 82*  CREATININE 3.07* 3.28* 3.55* 3.72* 3.69*  CALCIUM 7.9* 8.2* 7.8* 7.5* 7.6*  MG 2.3 2.5* 2.2 2.0 1.9  PHOS 3.2 1.9* <1.0* 2.2* 1.9*      CBC: Recent Labs  Lab 04/04/21 0344 04/05/21 0614 04/06/21 0434 04/07/21 0410 04/08/21 0523  WBC 9.1 12.3* 9.4 12.3* 10.5  NEUTROABS  --  10.7* 7.9* 10.6* 8.7*  HGB 9.3* 8.8* 8.6* 8.3* 6.9*  HCT 31.4* 29.5* 26.9* 26.3* 22.6*  MCV 103.3* 102.1* 95.7 97.4 97.4  PLT 122* 126* 117* 121* 120*       Lab Results  Component Value Date   HEPBSAG NON REACTIVE 03/29/2021   HEPBSAB NON REACTIVE 04/01/2021      Microbiology:  Recent Results (from the past 240 hour(s))  Resp Panel by RT-PCR (Flu A&B, Covid) Nasopharyngeal Swab  Status: None   Collection Time: 04/18/2021 11:35 AM   Specimen: Nasopharyngeal Swab; Nasopharyngeal(NP) swabs in vial transport medium  Result Value Ref Range Status   SARS Coronavirus 2 by RT PCR NEGATIVE NEGATIVE Final    Comment: (NOTE) SARS-CoV-2 target nucleic acids are NOT DETECTED.  The SARS-CoV-2 RNA is generally detectable in upper respiratory specimens during the acute phase of infection. The lowest concentration of SARS-CoV-2 viral copies this assay can detect is 138 copies/mL. A negative result does not preclude SARS-Cov-2 infection and should not be used as the sole basis for treatment or other patient management decisions. A negative result may occur with  improper specimen  collection/handling, submission of specimen other than nasopharyngeal swab, presence of viral mutation(s) within the areas targeted by this assay, and inadequate number of viral copies(<138 copies/mL). A negative result must be combined with clinical observations, patient history, and epidemiological information. The expected result is Negative.  Fact Sheet for Patients:  EntrepreneurPulse.com.au  Fact Sheet for Healthcare Providers:  IncredibleEmployment.be  This test is no t yet approved or cleared by the [place] FDA and  has been authorized for detection and/or diagnosis of SARS-CoV-2 by FDA under an Emergency Use Authorization (EUA). This EUA will remain  in effect (meaning this test can be used) for the duration of the COVID-19 declaration under Section 564(b)(1) of the Act, 21 U.S.C.section 360bbb-3(b)(1), unless the authorization is terminated  or revoked sooner.       Influenza A by PCR NEGATIVE NEGATIVE Final   Influenza B by PCR NEGATIVE NEGATIVE Final    Comment: (NOTE) The Xpert Xpress SARS-CoV-2/FLU/RSV plus assay is intended as an aid in the diagnosis of influenza from Nasopharyngeal swab specimens and should not be used as a sole basis for treatment. Nasal washings and aspirates are unacceptable for Xpert Xpress SARS-CoV-2/FLU/RSV testing.  Fact Sheet for Patients: EntrepreneurPulse.com.au  Fact Sheet for Healthcare Providers: IncredibleEmployment.be  This test is not yet approved or cleared by the [place] FDA and has been authorized for detection and/or diagnosis of SARS-CoV-2 by FDA under an Emergency Use Authorization (EUA). This EUA will remain in effect (meaning this test can be used) for the duration of the COVID-19 declaration under Section 564(b)(1) of the Act, 21 U.S.C. section 360bbb-3(b)(1), unless the authorization is terminated or revoked.  Performed at [facility], [address] [place], [place] [address]   MRSA PCR Screening     Status: None   Collection Time: 04/26/2021  9:04 PM   Specimen: Nasal Mucosa; Nasopharyngeal  Result Value Ref Range Status   MRSA by PCR NEGATIVE NEGATIVE Final    Comment:        The GeneXpert MRSA Assay (FDA approved for NASAL specimens only), is one component of a comprehensive MRSA colonization surveillance program. It is not intended to diagnose MRSA infection nor to guide or monitor treatment for MRSA infections. Performed at [facility], [address], [place], [place] [address]   Urine Culture     Status: None   Collection Time: 04/11/2021  9:09 PM   Specimen: Urine, Random  Result Value Ref Range Status   Specimen Description   Final    URINE, RANDOM Performed at [facility], [address] [place], [place] [address]    Special Requests   Final    NONE Performed at [facility], [address], [place], [place] [address]    Culture   Final    NO GROWTH Performed at  [facility], [place] [address], [place], [place] [address]    Report Status 04/05/2021 FINAL  Final    Coagulation Studies: No results for input(s): LABPROT, INR in the last 72 hours.  Urinalysis: No results for input(s): COLORURINE, LABSPEC, PHURINE, GLUCOSEU, HGBUR, BILIRUBINUR, KETONESUR, PROTEINUR, UROBILINOGEN, NITRITE, LEUKOCYTESUR in the last 72 hours.  Invalid input(s): APPERANCEUR     Imaging: No results found.    Medications:    sodium chloride Stopped (04/06/21 0801)   sodium chloride     sodium chloride     dextrose 5 % and 0.9% NaCl 100 mL/hr at 04/08/21 0700   feeding supplement (VITAL AF 1.2 CAL) 50 mL/hr at 04/07/21 1300   fentaNYL infusion INTRAVENOUS 175 mcg/hr (04/08/21 0852)   norepinephrine (LEVOPHED) Adult infusion 4 mcg/min (04/08/21 0852)   propofol (DIPRIVAN) infusion Stopped (04/05/21 1130)    aspirin  81 mg Per Tube Daily   chlorhexidine  gluconate (MEDLINE KIT)  15 mL Mouth Rinse BID   Chlorhexidine Gluconate Cloth  6 each Topical Q0600   docusate  100 mg Per Tube Daily   free water  30 mL Per Tube Q4H   heparin injection (subcutaneous)  5,000 Units Subcutaneous Q8H   latanoprost  1 drop Both Eyes QHS   mouth rinse  15 mL Mouth Rinse 10 times per day   midodrine  5 mg Per Tube TID WC   pantoprazole sodium  40 mg Per Tube Daily   phosphorus  250 mg Per Tube TID   polyethylene glycol  17 g Per Tube Daily   sodium chloride flush  3 mL Intravenous Q12H   timolol  1 drop Both Eyes BID   sodium chloride, sodium chloride, sodium chloride, acetaminophen (TYLENOL) oral liquid 160 mg/5 mL, acetaminophen **OR** acetaminophen, alteplase, guaiFENesin, heparin, ipratropium-albuterol, lidocaine (PF), lidocaine-prilocaine, ondansetron **OR** ondansetron (ZOFRAN) IV, pentafluoroprop-tetrafluoroeth, sodium chloride flush  Assessment/ Plan:  87 y.o. male with diabetes, hypertension, chronic kidney disease, coronary artery disease, ex-smoker, history of elevated PSA admitted on 03/30/2021 for Acute respiratory failure (HCC) [J96.00] Hypoxia [R09.02] Acute respiratory failure with hypoxia (HCC) [J96.01]   #Acute kidney injury on Chronic kidney disease stage IIIa Patient has underlying chronic kidney disease.  Risk factors include hypertension, atherosclerosis, advanced age.  Baseline creatinine of 1.6, GFR 49 from February 2022 AKI likely secondary to volume depletion and ATN. Renal ultrasound shows small kidneys with echogenicity  Urinalysis 0-5 RBCs, specific gravity of 1.012, negative for nitrites and leukocytes  -Creatinine currently 3.69 with 795 cc of urine output over the preceding 24 hours.  No immediate need for dialysis at the moment.  Continue to monitor renal parameters and avoid nephrotoxins as possible.   #Acute respiratory failure Continue ventilatory support for now.  #Hypernatremia DC D5 normal saline and transition to  D5W given elevated serum sodium.  Patient with multiorgan failure Overall prognosis appears very poor   LOS: 6 Aidah Forquer 6/11/202210:01 [place], [place]

## 2021-04-09 ENCOUNTER — Inpatient Hospital Stay: Payer: Medicare PPO

## 2021-04-09 DIAGNOSIS — I469 Cardiac arrest, cause unspecified: Secondary | ICD-10-CM

## 2021-04-09 DIAGNOSIS — R579 Shock, unspecified: Secondary | ICD-10-CM

## 2021-04-09 LAB — CBC WITH DIFFERENTIAL/PLATELET
Abs Immature Granulocytes: 0.25 10*3/uL — ABNORMAL HIGH (ref 0.00–0.07)
Basophils Absolute: 0.1 10*3/uL (ref 0.0–0.1)
Basophils Relative: 0 %
Eosinophils Absolute: 0 10*3/uL (ref 0.0–0.5)
Eosinophils Relative: 0 %
HCT: 27.6 % — ABNORMAL LOW (ref 39.0–52.0)
Hemoglobin: 8.2 g/dL — ABNORMAL LOW (ref 13.0–17.0)
Immature Granulocytes: 1 %
Lymphocytes Relative: 2 %
Lymphs Abs: 0.4 10*3/uL — ABNORMAL LOW (ref 0.7–4.0)
MCH: 29.7 pg (ref 26.0–34.0)
MCHC: 29.7 g/dL — ABNORMAL LOW (ref 30.0–36.0)
MCV: 100 fL (ref 80.0–100.0)
Monocytes Absolute: 0.7 10*3/uL (ref 0.1–1.0)
Monocytes Relative: 4 %
Neutro Abs: 16.9 10*3/uL — ABNORMAL HIGH (ref 1.7–7.7)
Neutrophils Relative %: 93 %
Platelets: 178 10*3/uL (ref 150–400)
RBC: 2.76 MIL/uL — ABNORMAL LOW (ref 4.22–5.81)
RDW: 16.4 % — ABNORMAL HIGH (ref 11.5–15.5)
Smear Review: NORMAL
WBC: 18.2 10*3/uL — ABNORMAL HIGH (ref 4.0–10.5)
nRBC: 1 % — ABNORMAL HIGH (ref 0.0–0.2)

## 2021-04-09 LAB — BASIC METABOLIC PANEL
Anion gap: 9 (ref 5–15)
BUN: 90 mg/dL — ABNORMAL HIGH (ref 8–23)
CO2: 25 mmol/L (ref 22–32)
Calcium: 7.6 mg/dL — ABNORMAL LOW (ref 8.9–10.3)
Chloride: 111 mmol/L (ref 98–111)
Creatinine, Ser: 3.96 mg/dL — ABNORMAL HIGH (ref 0.61–1.24)
GFR, Estimated: 14 mL/min — ABNORMAL LOW (ref >60)
Glucose, Bld: 175 mg/dL — ABNORMAL HIGH (ref 70–99)
Potassium: 4.6 mmol/L (ref 3.5–5.1)
Sodium: 145 mmol/L (ref 135–145)

## 2021-04-09 LAB — VITAMIN B1: Vitamin B1 (Thiamine): 172.8 nmol/L (ref 66.5–200.0)

## 2021-04-09 LAB — PHOSPHORUS: Phosphorus: 3.6 mg/dL (ref 2.5–4.6)

## 2021-04-09 LAB — MAGNESIUM: Magnesium: 1.8 mg/dL (ref 1.7–2.4)

## 2021-04-09 LAB — GLUCOSE, CAPILLARY: Glucose-Capillary: 144 mg/dL — ABNORMAL HIGH (ref 70–99)

## 2021-04-09 MED ORDER — VASOPRESSIN 20 UNITS/100 ML INFUSION FOR SHOCK
0.0000 [IU]/min | INTRAVENOUS | Status: DC
  Administered 2021-04-09: 0.03 [IU]/min via INTRAVENOUS
  Filled 2021-04-09: qty 100

## 2021-04-09 MED ORDER — MIDAZOLAM HCL 2 MG/2ML IJ SOLN
2.0000 mg | Freq: Once | INTRAMUSCULAR | Status: AC
  Administered 2021-04-09: 2 mg via INTRAVENOUS
  Filled 2021-04-09: qty 2

## 2021-04-09 MED ORDER — FENTANYL BOLUS VIA INFUSION
50.0000 ug | INTRAVENOUS | Status: DC | PRN
  Administered 2021-04-09 (×2): 50 ug via INTRAVENOUS
  Filled 2021-04-09: qty 100

## 2021-04-11 LAB — BLOOD GAS, ARTERIAL
Acid-Base Excess: 2.3 mmol/L — ABNORMAL HIGH (ref 0.0–2.0)
Bicarbonate: 25 mmol/L (ref 20.0–28.0)
FIO2: 0.6
O2 Saturation: 99.6 %
Patient temperature: 37
pCO2 arterial: 32 mmHg (ref 32.0–48.0)
pH, Arterial: 7.5 — ABNORMAL HIGH (ref 7.350–7.450)
pO2, Arterial: 161 mmHg — ABNORMAL HIGH (ref 83.0–108.0)

## 2021-04-28 NOTE — Progress Notes (Signed)
  Patient time of death: 0635 am Pronounced by this RN and Harriett Sine, RN. This RN, D. White, RN and patients grand daughter at bedside at time of death.   Britton-Lee, NP made aware. CDS notified and provided full release of patient.

## 2021-04-28 NOTE — Death Summary Note (Addendum)
DEATH SUMMARY   Patient Details  Name: Samuel Crutchfield Jr. MRN: [id number] DOB: 12/25/1926  Admission/Discharge Information   Admit Date:  04/08/2021  Date of Death:  04/02/2021  Time of Death:  06:35  Length of Stay: 7  Referring Physician: Tapia, Leisa, PA-C   Reason(s) for Hospitalization  Acute hypoxia requiring supplemental oxygen.  Diagnoses  Preliminary cause of death: Multi-organ failure in the setting of acute on chronic renal failure and subsequent cardiac arrest in the setting of advanced age and frailty.  Secondary Diagnoses (including complications and co-morbidities):  Principal Problem:   Acute respiratory failure (HCC) Active Problems:   Essential hypertension   Hyperkalemia   Fall   Acute metabolic encephalopathy   Acute kidney injury superimposed on CKD (HCC)   Protein-calorie malnutrition, severe   Cardiac arrest (HCC)   Shock (HCC)   Brief Hospital Course (including significant findings, care, treatment, and services provided and events leading to death)  Samuel Mcdonnell Jr. is a 87 y.o. year old male who presented to [facility] ED on 04/02/2021 from primary care provider's office for evaluation of hypoxia.  Per ED documentation the patient's daughter stated he fell to his knees 3 days prior to admission trying to get into a chair.  He has complained of having intermittent episodes of dizziness and lightheadedness.  His daughter also reported he has had a productive cough with clear phlegm, shortness of breath and has become increasingly weaker with poor oral intake.  Daughter took the patient to see his PCP where he was found to be hypoxic with an SPO2 of 88% -patient does not normally wear supplemental oxygen at home.  In the ED the patient was found to be hyperkalemic with mild lactic acidosis and respiratory acidosis.  CT head without contrast was negative for intracranial abnormality and CT chest without contrast showed unchanged scarring of the right lung base with  elevation of the right hemidiaphragm and pleural calcifications of the right lung base.  [facility] was consulted for admission to observation status due to the acute need for oxygen. 04/01/2021-nephrology consulted in the setting of AKI on CKD 3a & hyperkalemia due to suspected ATN secondary to volume depletion.  Patient treated with IV fluids giving shifting agents for hyperkalemia. 04/02/2021-concerns for ongoing lethargy, neurology consulted for additional work-up.  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  MRA head and neck revealed left carotid enterectomy and stable right carotid bifurcation without significant stenosis.  Chronic moderate to severe bilateral vertebral artery origin stenosis due to calcified clot but not significantly changed. 04/25/2021-decision made to proceed with hemodialysis, vascular placed HD cath.  While in dialysis before being hooked up to receive treatment patient's BP dropped to 69/36.  Plan for HD treatment aborted and patient transferred to ICU for peripheral vasopressors. PCCM consulted due to vasopressor need. 04/05/2021-cardiac arrest, bradycardia into asystole ROSC achieved after 10 minutes.  Patient emergently intubated requiring mechanical ventilation Due to patient's overall decline and multiorgan failure goals of care discussion was held with family members who decided to transition the patient to DNR. Overnight on 04/08/2021 to 04/11/2021 the patient became progressively more hemodynamically unstable requiring large increases of vasopressor dosing.  And the patient's heart rate dropped and became irregular.  Ultimately the patient passed away with his granddaughter bedside.  Pertinent Labs and Studies  Significant Diagnostic Studies DG Chest 1 View  Result Date: 04/05/2021 CLINICAL DATA:  Intubation and nasogastric tube placement EXAM:   CHEST  1 VIEW COMPARISON:  04/13/2021  FINDINGS: Endotracheal tube with tip just below the clavicular heads. The enteric tube reaches the stomach. Extensive artifact from EKG leads. Lower volume chest with indistinct infiltrates at the bases. There is elevation of the lateral right diaphragm scar-like appearance in this location by CT. Normal heart size. No visible pneumothorax. IMPRESSION: 1. Unremarkable endotracheal and orogastric tube positioning. 2. Worsening aeration with infiltrates at the bases. Electronically Signed   By: Monte Fantasia M.D.   On: 04/05/2021 04:13   DG Chest 2 View  Result Date: 04/02/2021 CLINICAL DATA:  Short of breath EXAM: CHEST - 2 VIEW COMPARISON:  09/23/2017 FINDINGS: Elevated right hemidiaphragm with colonic gas below the right hemidiaphragm. This has improved from the prior study. There is mild right lower lobe airspace disease. Likely atelectasis. Heart size is normal. Coronary artery calcification and aortic arch calcification. Negative for heart failure. Left lung is clear. No effusion. IMPRESSION: Elevated right hemidiaphragm with right lower lobe atelectasis. Remaining lungs clear. Electronically Signed   By: Franchot Gallo M.D.   On: 04/27/2021 11:36   DG Abd 1 View  Result Date: 04/05/2021 CLINICAL DATA:  Nasogastric tube placement EXAM: ABDOMEN - 1 VIEW COMPARISON:  04/04/2021 FINDINGS: Nasogastric tube tip is seen overlying the expected proximal body of the stomach. Right common femoral hemodialysis catheter tip is seen at the expected confluence of the a inferior vena cava and renal veins. Normal abdominal gas pattern. No gross free intraperitoneal gas. Vascular stents are seen within the common iliac arteries bilaterally. No acute bone abnormality. IMPRESSION: Nasogastric tube tip within the proximal body of the stomach. Electronically Signed   By: Fidela Salisbury MD   On: 04/05/2021 04:14   DG Abd 1 View  Result Date: 04/04/2021 CLINICAL DATA:  Nasogastric tube placed yesterday, abdominal pain EXAM:  ABDOMEN - 1 VIEW COMPARISON:  None FINDINGS: Tip of nasogastric tube projects over proximal stomach. Curvilinear metallic foreign body projects LEFT paraspinal at D32, uncertain etiology. RIGHT femoral dual lumen central venous catheter tip projecting over SVC at L2. Retained contrast in colon. Scattered stool in colon to rectum. No bowel dilatation or bowel wall thickening. Osseous demineralization. IMPRESSION: No acute abnormalities. Electronically Signed   By: Lavonia Dana M.D.   On: 04/04/2021 14:52   CT Head Wo Contrast  Result Date: 04/23/2021 CLINICAL DATA:  Fall, head trauma. EXAM: CT HEAD WITHOUT CONTRAST TECHNIQUE: Contiguous axial images were obtained from the base of the skull through the vertex without intravenous contrast. COMPARISON:  None. FINDINGS: Brain: Mild cerebral atrophy. Mild white matter hypodensity bilaterally. Negative for acute infarct, hemorrhage, mass. Vascular: Negative for hyperdense vessel Skull: Negative Sinuses/Orbits: Mucosal edema and bony thickening left sphenoid sinus. Remaining sinuses clear. No orbital lesion. Other: None IMPRESSION: No acute abnormality.  Mild atrophy and mild white matter ischemia. Electronically Signed   By: Franchot Gallo M.D.   On: 04/19/2021 11:55   CT CHEST WO CONTRAST  Result Date: 04/02/2021 CLINICAL DATA:  Pneumonia, hypoxia EXAM: CT CHEST WITHOUT CONTRAST TECHNIQUE: Multidetector CT imaging of the chest was performed following the standard protocol without IV contrast. COMPARISON:  CT abdomen pelvis, 07/25/2018 FINDINGS: Cardiovascular: Aortic atherosclerosis. Normal heart size. Extensive 3 vessel coronary artery calcifications and/or stents. No pericardial effusion. Mediastinum/Nodes: No enlarged mediastinal, hilar, or axillary lymph nodes. Thyroid gland, trachea, and esophagus demonstrate no significant findings. Lungs/Pleura: Unchanged scarring of the right lung base with elevation of the right hemidiaphragm and pleural calcifications in  the right  lung base. Minimal centrilobular emphysema. No pleural effusion or pneumothorax. Upper Abdomen: No acute abnormality.  Gallstones in the gallbladder. Musculoskeletal: No chest wall mass or suspicious bone lesions identified. IMPRESSION: 1. Unchanged scarring of the right lung base with elevation of the right hemidiaphragm and pleural calcifications in the right lung base. Findings are consistent with sequelae of prior infection or inflammation. 2. No acute airspace opacity. 3. Minimal emphysema. 4. Coronary artery disease. 5. Cholelithiasis. Aortic Atherosclerosis (ICD10-I70.0) and Emphysema (ICD10-J43.9). Electronically Signed   By: Eddie Candle M.D.   On: 04/16/2021 14:49   MR ANGIO HEAD WO CONTRAST  Result Date: 04/02/2021 CLINICAL DATA:  87 year old male with head trauma, fall. Altered mental status. Hypoxia. EXAM: MRA HEAD WITHOUT CONTRAST TECHNIQUE: Angiographic images of the Circle of Willis were acquired using MRA technique without intravenous contrast. COMPARISON:  Brain MRI today reported separately. FINDINGS: Antegrade flow in the posterior circulation with patent distal vertebral arteries and vertebrobasilar junction. Mild left V4 irregularity and stenosis. No distal right vertebral stenosis. Both PICA origins are patent. Patent basilar artery without stenosis. SCA and PCA origins are patent. Posterior communicating arteries are diminutive or absent. Mild motion artifact at the level of the PCAs, which appear grossly normal. Antegrade flow in both ICA siphons. The left siphon is asymmetrically smaller. Motion artifact limits evaluation of the distal cavernous and anterior genu segments, but no definite hemodynamically significant siphon stenosis is identified. Carotid termini are patent. Right ACA A1 is dominant. Anterior communicating artery and visible ACA branches are patent with mild irregularity. Both MCA M1 segments and MCA bifurcations are patent with mild irregularity. No proximal  MCA branch occlusion is identified. IMPRESSION: 1. Evidence of intracranial atherosclerosis but no definite hemodynamically significant stenosis. 2. Left ICA siphon appears mildly smaller than the right. See Neck MRA findings today reported separately. Electronically Signed   By: Genevie Ann M.D.   On: 04/02/2021 10:29   MR ANGIO NECK WO CONTRAST  Result Date: 04/11/2021 CLINICAL DATA:  87 year old male with head trauma, fall. Altered mental status. Hypoxia. EXAM: MRA NECK WITHOUT CONTRAST TECHNIQUE: Angiographic images of the neck were obtained using MRA technique without intravenous contrast. Carotid stenosis measurements (when applicable) are obtained utilizing NASCET criteria, using the distal internal carotid diameter as the denominator. COMPARISON:  Head MRI and intracranial MRA from the same day reported separately. CTA neck 06/17/2017. FINDINGS: 3D time-of-flight imaging in the neck. Three vessel arch configuration with antegrade flow signal in the bilateral cervical carotid and vertebral arteries. Mild right carotid bifurcation irregularity appears stable since the 2018 CTA with no significant stenosis. The right ICA remains patent to the skull base. Left carotid endarterectomy since 2018 with resolved high-grade proximal left ICA stenosis. The left carotid bifurcation now appears within normal limits. Distal to the bulb the cervical left ICA is chronically smaller than the left, stable since 2018. Proximal subclavian arteries and vertebral artery origins are patent. Bilateral vertebral origin calcified plaque and stenosis seen in 2018 probably has not significantly changed. The right vertebral is mildly dominant as before. And no additional vertebral artery stenosis is identified at the skull base. IMPRESSION: 1. Left carotid endarterectomy with resolved high-grade proximal left ICA stenosis since 2018. Chronically small left ICA caliber is stable. 2. Stable right carotid bifurcation atherosclerosis without  significant stenosis. 3. Chronic moderate to severe bilateral vertebral artery origin stenosis due to calcified plaque has not significantly changed. Both vertebrals remain patent to the skull base, the right is dominant as before. Electronically Signed  By: Genevie Ann M.D.   On: 04/21/2021 08:07   MR BRAIN WO CONTRAST  Result Date: 04/02/2021 CLINICAL DATA:  87 year old male with head trauma, fall. Altered mental status. Hypoxia. EXAM: MRI HEAD WITHOUT CONTRAST TECHNIQUE: Multiplanar, multiecho pulse sequences of the brain and surrounding structures were obtained without intravenous contrast. COMPARISON:  Head CT 03/29/2021. CTA neck 06/17/2017. FINDINGS: MRI HEAD FINDINGS Brain: Cerebral volume is within normal limits for age. No restricted diffusion to suggest acute infarction. No midline shift, mass effect, evidence of mass lesion, ventriculomegaly, extra-axial collection or acute intracranial hemorrhage. Cervicomedullary junction and pituitary are within normal limits. Incidental adhesion right frontal horn, normal variant. Largely normal for age gray and white matter signal throughout the brain. There is a small area of chronic encephalomalacia in the inferior right occipital pole (series 8, image 17) with possible mild hemosiderin. But no other encephalomalacia or chronic cerebral blood products identified. Deep gray matter nuclei, brainstem and cerebellum appear negative. Vascular: Major intracranial vascular flow voids are preserved. Skull and upper cervical spine: Negative for age visible cervical spine. Normal bone marrow signal. Sinuses/Orbits: Postoperative changes to the left globe, otherwise negative orbits. Mild chronic left sphenoid sinus disease as demonstrated by CT. Other: Mastoids are clear. Grossly normal visible internal auditory structures. Visible scalp and face appear negative. IMPRESSION: 1. No acute intracranial abnormality. 2. 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 Electronically Signed   By: Genevie Ann M.D.   On: 04/02/2021 10:26   US RENAL  Result Date: 04/02/2021 CLINICAL DATA:  Acute renal failure EXAM: RENAL / URINARY TRACT ULTRASOUND COMPLETE COMPARISON:  None. FINDINGS: Right Kidney: Renal measurements: 8.9 x 5.1 x 4.8 cm = volume: 112.9 mL. Echogenicity is increased. Renal cortical thickness is normal. No mass, perinephric fluid, or hydronephrosis visualized. No sonographically demonstrable calculus or ureterectasis. Left Kidney: Renal measurements: 9.3 x 0.2 x 4.9 cm = volume: 122.3 mL. Echogenicity is increased. Renal cortical thickness is normal. No mass, perinephric fluid, or hydronephrosis visualized. No sonographically demonstrable calculus or ureterectasis. Bladder: There is thickened urinary bladder wall with apparent debris posteriorly within the dependent portion of the bladder. Other: None. IMPRESSION: 1. Urinary bladder wall is thickened and contains debris. Suspect cystitis. 2. Kidneys are rather small and show increased renal echogenicity, findings indicative of a degree of underlying medical renal disease. Renal cortical thickness bilaterally is within normal limits. No obstructing focus in either kidney. Electronically Signed   By: Lowella Grip III M.D.   On: 04/02/2021 13:15   PERIPHERAL VASCULAR CATHETERIZATION  Result Date: 04/04/2021 See surgical note for result.  DG Chest Port 1 View  Result Date: Apr 13, 2021 CLINICAL DATA:  Endotracheal tube EXAM: PORTABLE CHEST 1 VIEW COMPARISON:  Radiograph 04/05/2021 FINDINGS: Endotracheal tube tip terminates 4.6 cm from the carina. Transesophageal tube tip and side port distal to the GE junction. Telemetry leads and external support devices overlie the chest. Persistent asymmetric elevation of the right hemidiaphragm. Some adjacent atelectatic changes. Blunting of the right costophrenic sulcus is similar to prior and likely  related to scarring present on comparison CT. No visible layering effusions. Some increasing patchy opacities are present in the bilateral lower lobes which could be atelectatic or reflect some developing edema or airspace disease. Stable cardiomediastinal contours with a calcified, tortuous aorta. No pneumothorax. IMPRESSION: 1. Satisfactory positioning of lines and tubes, as above 2. Increasing patchy opacities in the mid to lower lungs, could  reflect worsening volume loss, developing airspace disease, edema or some combination there of. 3. Chronic elevation of the right hemidiaphragm with adjacent basilar scarring likely resulting in the blunting of the right costophrenic sulcus. 4.  Aortic Atherosclerosis (ICD10-I70.0). Electronically Signed   By: Price  DeHay M.D.   On: 04/25/2021 04:29   DG Abd Portable 1V  Result Date: 04/19/2021 CLINICAL DATA:  Nasogastric tube placement EXAM: PORTABLE ABDOMEN - 1 VIEW COMPARISON:  None. FINDINGS: Nasogastric tube is coiled in the upper esophagus with the tip directed superiorly and not seen. There is no bowel dilatation or air-fluid level to suggest bowel obstruction. No free air. There is apparent atelectasis left lower lobe. There is aortic atherosclerosis. IMPRESSION: Nasogastric tube coiled in upper esophagus with tip directed superiorly. No bowel obstruction or free air. Left lower lobe atelectatic change. Aortic Atherosclerosis (ICD10-I70.0). These results will be called to the ordering clinician or representative by the Radiologist Assistant, and communication documented in the PACS or [facility] Dashboard. Electronically Signed   By: William  Woodruff III M.D.   On: 04/04/2021 11:41   DG Naso G Tube Plc W/Fl W/Rad  Result Date: 04/25/2021 CLINICAL DATA:  NG tube placement for feeding. EXAM: NASO G TUBE PLACEMENT WITH FL AND WITH RAD CONTRAST:  None. FLUOROSCOPY TIME:  Fluoroscopy Time:  0 minutes 9 seconds Radiation Exposure Index (if provided by the fluoroscopic  device): 11.1 mGy Number of Acquired Spot Images: 1 COMPARISON:  None. FINDINGS: Nasogastric tube placed via right nostril and advanced to the stomach on first attempt with tip and side-port over the stomach in the left upper quadrant confirmed by fluoroscopic evaluation. Patient tolerated procedure well as the tube was secured and patient sent back to the ward in stable condition. IMPRESSION: Successful NG tube placement with tip and side-port over the stomach in the left upper quadrant. Electronically Signed   By: Daniel  Boyle M.D.   On: 04/23/2021 16:06   DG Bone Survey Met  Result Date: 04/24/2021 CLINICAL DATA:  87-year-old with elevated PSA. EXAM: METASTATIC BONE SURVEY COMPARISON:  Recent head and chest CTs. FINDINGS: No evidence of blastic osseous lesion. No destructive lytic lesion. Enteric tube is in place with tip and side-port in the stomach. Right femoral catheter is in place. Multiple vascular stents in the abdomen. Retrocardiac and left lung base hazy opacity are new from prior exam. Stable right lung base scarring. Age related degenerative change in the spine. Calvarial calcification is pineal in recent head CT. IMPRESSION: 1. No evidence of focal osseous lesion. Please note, nuclear medicine bone scan may be considered if there is clinical concern for prostate metastasis. 2. Hazy left lung base opacity is new from recent chest CT and may represent airspace disease/atelectasis or pleural fluid. Electronically Signed   By: Melanie  Sanford M.D.   On: 04/13/2021 16:32   EEG adult  Result Date: 04/13/2021 Kirkpatrick, McNeill P, MD     04/04/2021  9:29 AM History: 87 yo with AMS Sedation: none Technique: This is a 21 channel routine scalp EEG performed at the bedside with bipolar and monopolar montages arranged in accordance to the international 10/20 system of electrode placement. One channel was dedicated to EKG recording. Background: The background consists predominantly of 5 - 6Hz theta  activity with some irregular delta range activity superimposed as well.  There is occasionally visible a posterior dominant rhythm(PDR) of 7 to 8 Hz.  There is no evolution or other aspects concerning for ictal activity. There are also frequent bifrontally   predominant discharges with triphasic morphology.  These are at times periodic. Photic stimulation: Physiologic driving is present EEG Abnormalities: 1) triphasic waves 2) diffuse slow activity 3) slow PDR Clinical Interpretation: This EEG is consistent with a generalized nonspecific cerebral dysfunction (encephalopathy). There was no seizure or seizure predisposition recorded on this study. Please note that lack of epileptiform activity on EEG does not preclude the possibility of epilepsy. McNeill Kirkpatrick, MD [facility] [phone] If 7pm- 7am, please page neurology on call as listed in AMION.   ECHOCARDIOGRAM COMPLETE  Result Date: 04/04/2021    ECHOCARDIOGRAM REPORT   Patient Name:   Samuel Schlarb Jr. Date of Exam: 04/04/2021 Medical Rec #:  [id number]         Height:       66.0 in Accession #:    [id number]        Weight:       134.3 lb Date of Birth:  02/18/1927         BSA:          1.688 m Patient Age:    87 years          BP:           147/61 mmHg Patient Gender: M                 HR:           94 bpm. Exam Location:  [facility] Procedure: 2D Echo, Color Doppler and Cardiac Doppler Indications:     Acute Respiratory distress R06.03  History:         Patient has no prior history of Echocardiogram examinations.                  Risk Factors:Diabetes and Hypertension. PVD.  Sonographer:     Jerry Hege RDCS (AE) Referring Phys:  [id number] KURIAN KASA Diagnosing Phys: Alexander Paraschos MD  Sonographer Comments: No subcostal window, no parasternal window and Technically challenging study due to limited acoustic windows. Image acquisition challenging due to patient body habitus. IMPRESSIONS  1. Left ventricular ejection fraction, by estimation, is 60  to 65%. The left ventricle has normal function. The left ventricle has no regional wall motion abnormalities. Left ventricular diastolic parameters are indeterminate.  2. Right ventricular systolic function is normal. The right ventricular size is normal.  3. The mitral valve is normal in structure. Mild mitral valve regurgitation. No evidence of mitral stenosis.  4. The aortic valve is normal in structure. Aortic valve regurgitation is not visualized. No aortic stenosis is present.  5. The inferior vena cava is normal in size with greater than 50% respiratory variability, suggesting right atrial pressure of 3 mmHg. FINDINGS  Left Ventricle: Left ventricular ejection fraction, by estimation, is 60 to 65%. The left ventricle has normal function. The left ventricle has no regional wall motion abnormalities. The left ventricular internal cavity size was normal in size. There is  no left ventricular hypertrophy. Left ventricular diastolic parameters are indeterminate. Right Ventricle: The right ventricular size is normal. No increase in right ventricular wall thickness. Right ventricular systolic function is normal. Left Atrium: Left atrial size was normal in size. Right Atrium: Right atrial size was normal in size. Pericardium: There is no evidence of pericardial effusion. Mitral Valve: The mitral valve is normal in structure. Mild mitral valve regurgitation. No evidence of mitral valve stenosis. Tricuspid Valve: The tricuspid valve is normal in structure. Tricuspid valve regurgitation is mild . No evidence of tricuspid stenosis. Aortic   Valve: The aortic valve is normal in structure. Aortic valve regurgitation is not visualized. No aortic stenosis is present. Aortic valve mean gradient measures 2.0 mmHg. Aortic valve peak gradient measures 2.9 mmHg. Aortic valve area, by VTI measures 3.75 cm. Pulmonic Valve: The pulmonic valve was normal in structure. Pulmonic valve regurgitation is not visualized. No evidence of  pulmonic stenosis. Aorta: The aortic root is normal in size and structure. Venous: The inferior vena cava is normal in size with greater than 50% respiratory variability, suggesting right atrial pressure of 3 mmHg. IAS/Shunts: No atrial level shunt detected by color flow Doppler.  LEFT VENTRICLE PLAX 2D LVIDd:         2.91 cm LVIDs:         1.61 cm LV PW:         1.31 cm LV IVS:        1.12 cm LVOT diam:     2.00 cm LV SV:         41 LV SV Index:   24 LVOT Area:     3.14 cm  RIGHT VENTRICLE RV S prime:     13.30 cm/s TAPSE (M-mode): 3.5 cm LEFT ATRIUM             Index       RIGHT ATRIUM           Index LA diam:        3.30 cm 1.95 cm/m  RA Area:     13.00 cm LA Vol (A2C):   71.2 ml 42.17 ml/m RA Volume:   27.70 ml  16.41 ml/m LA Vol (A4C):   53.9 ml 31.93 ml/m LA Biplane Vol: 65.1 ml 38.56 ml/m  AORTIC VALVE AV Area (Vmax):    2.64 cm AV Area (Vmean):   2.84 cm AV Area (VTI):     3.75 cm AV Vmax:           84.60 cm/s AV Vmean:          57.300 cm/s AV VTI:            0.109 m AV Peak Grad:      2.9 mmHg AV Mean Grad:      2.0 mmHg LVOT Vmax:         71.10 cm/s LVOT Vmean:        51.800 cm/s LVOT VTI:          0.130 m LVOT/AV VTI ratio: 1.19  AORTA Ao Root diam: 3.30 cm MITRAL VALVE MV Area (PHT): 3.65 cm    SHUNTS MV Decel Time: 208 msec    Systemic VTI:  0.13 m MV E velocity: 99.40 cm/s  Systemic Diam: 2.00 cm Alexander Paraschos MD Electronically signed by Alexander Paraschos MD Signature Date/Time: 04/04/2021/4:14:56 PM    Final     Microbiology Recent Results (from the past 240 hour(s))  Resp Panel by RT-PCR (Flu A&B, Covid) Nasopharyngeal Swab     Status: None   Collection Time: 04/01/2021 11:35 AM   Specimen: Nasopharyngeal Swab; Nasopharyngeal(NP) swabs in vial transport medium  Result Value Ref Range Status   SARS Coronavirus 2 by RT PCR NEGATIVE NEGATIVE Final    Comment: (NOTE) SARS-CoV-2 target nucleic acids are NOT DETECTED.  The SARS-CoV-2 RNA is generally detectable in upper  respiratory specimens during the acute phase of infection. The lowest concentration of SARS-CoV-2 viral copies this assay can detect is 138 copies/mL. A negative result does not preclude SARS-Cov-2 infection and should not be used   as the sole basis for treatment or other patient management decisions. A negative result may occur with  improper specimen collection/handling, submission of specimen other than nasopharyngeal swab, presence of viral mutation(s) within the areas targeted by this assay, and inadequate number of viral copies(<138 copies/mL). A negative result must be combined with clinical observations, patient history, and epidemiological information. The expected result is Negative.  Fact Sheet for Patients:  https://www.fda.gov/media/152166/download  Fact Sheet for Healthcare Providers:  https://www.fda.gov/media/152162/download  This test is no t yet approved or cleared by the [place] FDA and  has been authorized for detection and/or diagnosis of SARS-CoV-2 by FDA under an Emergency Use Authorization (EUA). This EUA will remain  in effect (meaning this test can be used) for the duration of the COVID-19 declaration under Section 564(b)(1) of the Act, 21 U.S.C.section 360bbb-3(b)(1), unless the authorization is terminated  or revoked sooner.       Influenza A by PCR NEGATIVE NEGATIVE Final   Influenza B by PCR NEGATIVE NEGATIVE Final    Comment: (NOTE) The Xpert Xpress SARS-CoV-2/FLU/RSV plus assay is intended as an aid in the diagnosis of influenza from Nasopharyngeal swab specimens and should not be used as a sole basis for treatment. Nasal washings and aspirates are unacceptable for Xpert Xpress SARS-CoV-2/FLU/RSV testing.  Fact Sheet for Patients: https://www.fda.gov/media/152166/download  Fact Sheet for Healthcare Providers: https://www.fda.gov/media/152162/download  This test is not yet approved or cleared by the [place] FDA and has been  authorized for detection and/or diagnosis of SARS-CoV-2 by FDA under an Emergency Use Authorization (EUA). This EUA will remain in effect (meaning this test can be used) for the duration of the COVID-19 declaration under Section 564(b)(1) of the Act, 21 U.S.C. section 360bbb-3(b)(1), unless the authorization is terminated or revoked.  Performed at [facility], [address] [place], [place] [address]   MRSA PCR Screening     Status: None   Collection Time: 04/02/2021  9:04 PM   Specimen: Nasal Mucosa; Nasopharyngeal  Result Value Ref Range Status   MRSA by PCR NEGATIVE NEGATIVE Final    Comment:        The GeneXpert MRSA Assay (FDA approved for NASAL specimens only), is one component of a comprehensive MRSA colonization surveillance program. It is not intended to diagnose MRSA infection nor to guide or monitor treatment for MRSA infections. Performed at [facility], [address] [place], [place] [address]   Urine Culture     Status: None   Collection Time: 03/29/2021  9:09 PM   Specimen: Urine, Random  Result Value Ref Range Status   Specimen Description   Final    URINE, RANDOM Performed at [facility], [address] [place], [place] [address]    Special Requests   Final    NONE Performed at  [facility]ospital [facility], [address] [place], [place] [address]    Culture   Final    NO GROWTH Performed at [facility], [address] [place], [place] [address]    Report Status 04/05/2021 FINAL  Final    Lab Basic Metabolic Panel: Recent Labs  Lab 04/05/21 0614 04/06/21 0434 04/07/21 0410 04/08/21 0523 04/19/2021 0429  NA 148* 147* 148* 148* 145  K 4.8 3.8 3.9 3.7 4.6  CL 107 113* 112* 114* 111  CO2 28 27 27 27 25  GLUCOSE 198* 149* 217* 159* 175*  BUN 76* 83* 84* 82* 90*  CREATININE 3.28* 3.55* 3.72* 3.69* 3.96*  CALCIUM 8.2* 7.8*   7.5* 7.6* 7.6*  MG 2.5* 2.2 2.0 1.9 1.8  PHOS 1.9* <1.0* 2.2* 1.9* 3.6   Liver  Function Tests: Recent Labs  Lab 04/21/2021 2133 04/04/21 0344 04/05/21 0614  AST  --   --  1,167*  ALT  --   --  745*  ALKPHOS  --   --  200*  BILITOT  --   --  0.8  PROT  --   --  5.4*  ALBUMIN 2.7* 2.5* 2.4*   No results for input(s): LIPASE, AMYLASE in the last 168 hours. Recent Labs  Lab 04/02/21 0919  AMMONIA 27   CBC: Recent Labs  Lab 04/05/21 0614 04/06/21 0434 04/07/21 0410 04/08/21 0523 04/08/21 2235 15-Apr-2021 0429  WBC 12.3* 9.4 12.3* 10.5  --  18.2*  NEUTROABS 10.7* 7.9* 10.6* 8.7*  --  16.9*  HGB 8.8* 8.6* 8.3* 6.9* 7.4* 8.2*  HCT 29.5* 26.9* 26.3* 22.6* 23.8* 27.6*  MCV 102.1* 95.7 97.4 97.4  --  100.0  PLT 126* 117* 121* 120*  --  178   Cardiac Enzymes: No results for input(s): CKTOTAL, CKMB, CKMBINDEX, TROPONINI in the last 168 hours. Sepsis Labs: Recent Labs  Lab 04/04/21 1201 04/05/21 0614 04/06/21 0434 04/07/21 0410 04/08/21 0523 2021/04/15 0429  WBC  --    < > 9.4 12.3* 10.5 18.2*  LATICACIDVEN 1.1  --   --   --   --   --    < > = values in this interval not displayed.    Procedures/Operations  04/10/2021 hemodialysis catheter triple-lumen placement 04/05/2021 CODE BLUE resuscitation 04/05/2021 endotracheal tube placement    Domingo Pulse Rust-[place], AGACNP-BC Acute Care Nurse Practitioner [facility]   [phone] / [phone] Please see Amion for pager details.   Huel Cote Rust-Chester 04/15/21, 6:41 AM   Attending attestation: I agree with the details of the note as noted above.  There has been a suspicion of possible pneumonia however this was never proven.  The findings noted on chest x-ray likely represented areas of atelectasis/possible volume overload due to renal failure.  Renold Don, MD [facility]

## 2021-04-28 NOTE — Progress Notes (Signed)
   04-28-2021 0630  Clinical Encounter Type  Visited With Patient and family together  Visit Type Initial;Spiritual support  Referral From Nurse  Consult/Referral To Chaplain  Spiritual Encounters  Spiritual Needs Grief support  Chaplain Autumn Patty responded to a page in [place], [place].  Nurse stated, Pt is expiring and the family need support. When I arrived the pt had died and there were two family members at the patience bedside. I provided a ministry of presence, comfort and grief support by using encouraging words and songs and prayer was provided and accepted at the end of the visit.

## 2021-04-28 DEATH — deceased

## 2021-06-19 ENCOUNTER — Encounter: Payer: Medicare PPO | Admitting: Family Medicine

## 2021-07-13 ENCOUNTER — Ambulatory Visit: Payer: Medicare PPO

## 2021-07-17 ENCOUNTER — Encounter (INDEPENDENT_AMBULATORY_CARE_PROVIDER_SITE_OTHER): Payer: Medicare PPO

## 2021-07-17 ENCOUNTER — Ambulatory Visit (INDEPENDENT_AMBULATORY_CARE_PROVIDER_SITE_OTHER): Payer: Medicare PPO | Admitting: Vascular Surgery

## 2021-12-03 IMAGING — CR DG ABDOMEN 1V
1 series · 1 of 1 positions shown · non-contrast
Comparison: None.

CLINICAL DATA: Constipation, abdominal pain.

EXAM:
ABDOMEN - 1 VIEW

[dg abd 1 view]
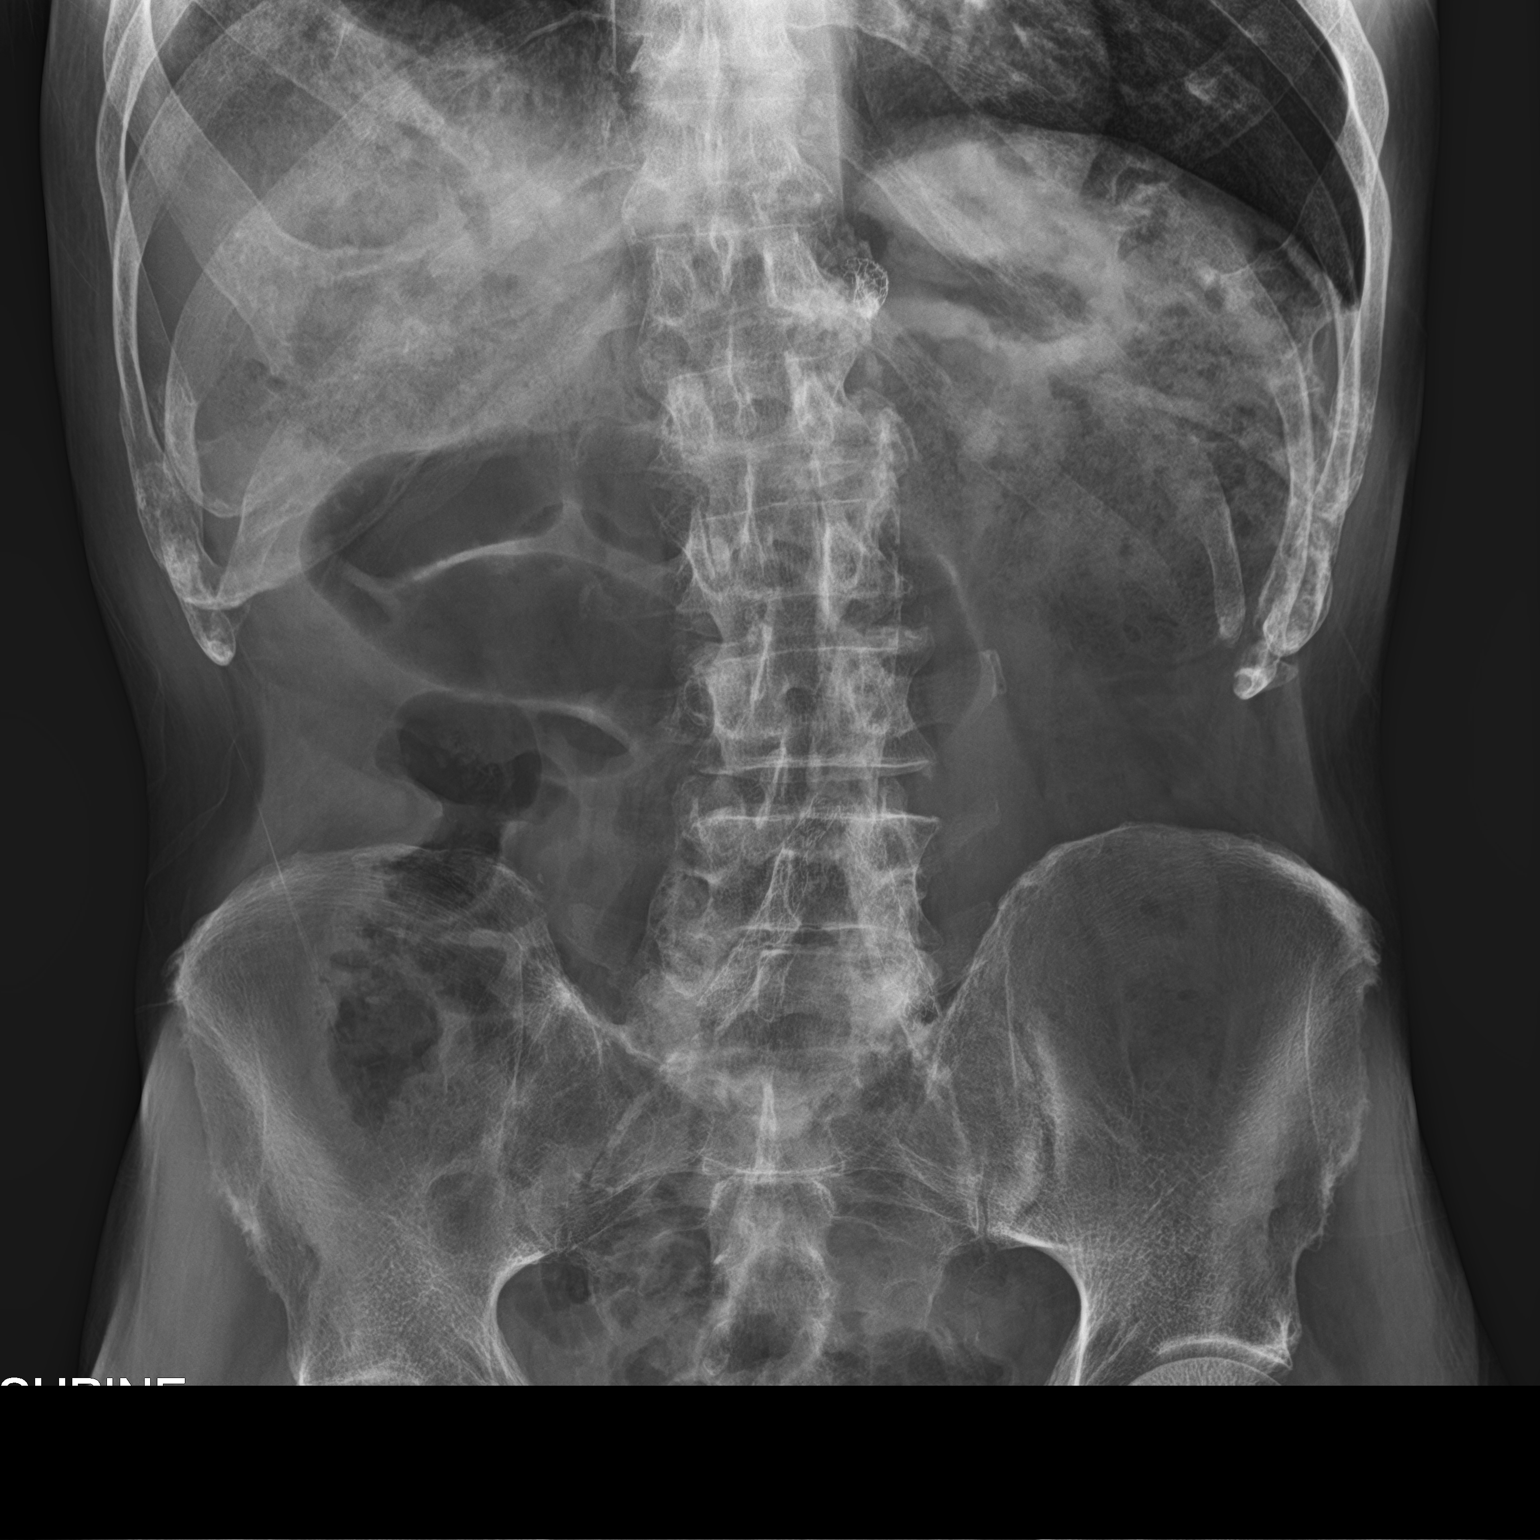

[1 of 1 positions shown; findings below may reference images not displayed]

FINDINGS: No abnormal bowel dilatation is noted. Moderate amount of stool seen
throughout the colon. No radio-opaque calculi or other significant
radiographic abnormality are seen.
IMPRESSION: Moderate stool burden.  No abnormal bowel dilatation.
